# Patient Record
Sex: Male | Born: 1935 | Race: White | Hispanic: No | Marital: Married | Smoking: Former smoker
Health system: Southern US, Community
[De-identification: ages and names within clinical notes are randomized; demographics above are authoritative.]

## PROBLEM LIST (undated history)

## (undated) DIAGNOSIS — K8020 Calculus of gallbladder without cholecystitis without obstruction: Secondary | ICD-10-CM

## (undated) DIAGNOSIS — N183 Chronic kidney disease, stage 3 unspecified: Secondary | ICD-10-CM

## (undated) DIAGNOSIS — N529 Male erectile dysfunction, unspecified: Secondary | ICD-10-CM

## (undated) DIAGNOSIS — K635 Polyp of colon: Secondary | ICD-10-CM

## (undated) DIAGNOSIS — Z95828 Presence of other vascular implants and grafts: Secondary | ICD-10-CM

## (undated) DIAGNOSIS — M1990 Unspecified osteoarthritis, unspecified site: Secondary | ICD-10-CM

## (undated) DIAGNOSIS — D649 Anemia, unspecified: Secondary | ICD-10-CM

## (undated) DIAGNOSIS — L409 Psoriasis, unspecified: Secondary | ICD-10-CM

## (undated) DIAGNOSIS — K5730 Diverticulosis of large intestine without perforation or abscess without bleeding: Secondary | ICD-10-CM

## (undated) DIAGNOSIS — Z7982 Long term (current) use of aspirin: Secondary | ICD-10-CM

## (undated) DIAGNOSIS — I10 Essential (primary) hypertension: Secondary | ICD-10-CM

## (undated) DIAGNOSIS — F419 Anxiety disorder, unspecified: Secondary | ICD-10-CM

## (undated) DIAGNOSIS — N400 Enlarged prostate without lower urinary tract symptoms: Secondary | ICD-10-CM

## (undated) DIAGNOSIS — Z7901 Long term (current) use of anticoagulants: Secondary | ICD-10-CM

## (undated) DIAGNOSIS — Z972 Presence of dental prosthetic device (complete) (partial): Secondary | ICD-10-CM

## (undated) DIAGNOSIS — N200 Calculus of kidney: Secondary | ICD-10-CM

## (undated) DIAGNOSIS — H269 Unspecified cataract: Secondary | ICD-10-CM

## (undated) DIAGNOSIS — I2510 Atherosclerotic heart disease of native coronary artery without angina pectoris: Secondary | ICD-10-CM

## (undated) DIAGNOSIS — I779 Disorder of arteries and arterioles, unspecified: Secondary | ICD-10-CM

## (undated) DIAGNOSIS — Z8719 Personal history of other diseases of the digestive system: Secondary | ICD-10-CM

## (undated) DIAGNOSIS — I999 Unspecified disorder of circulatory system: Secondary | ICD-10-CM

## (undated) DIAGNOSIS — J449 Chronic obstructive pulmonary disease, unspecified: Secondary | ICD-10-CM

## (undated) DIAGNOSIS — K219 Gastro-esophageal reflux disease without esophagitis: Secondary | ICD-10-CM

## (undated) DIAGNOSIS — E785 Hyperlipidemia, unspecified: Secondary | ICD-10-CM

## (undated) DIAGNOSIS — N289 Disorder of kidney and ureter, unspecified: Secondary | ICD-10-CM

## (undated) HISTORY — DX: Hyperlipidemia, unspecified: E78.5

## (undated) HISTORY — PX: LEG SURGERY: SHX1003

## (undated) HISTORY — PX: CAROTID STENT: SHX1301

## (undated) HISTORY — PX: EYE SURGERY: SHX253

## (undated) HISTORY — PX: BACK SURGERY: SHX140

---

## 2003-10-20 ENCOUNTER — Other Ambulatory Visit: Payer: Self-pay

## 2005-03-03 ENCOUNTER — Ambulatory Visit: Payer: Self-pay | Admitting: Internal Medicine

## 2006-03-31 ENCOUNTER — Ambulatory Visit: Payer: Self-pay | Admitting: Internal Medicine

## 2006-04-10 ENCOUNTER — Ambulatory Visit: Payer: Self-pay | Admitting: Internal Medicine

## 2006-06-20 ENCOUNTER — Ambulatory Visit: Payer: Self-pay | Admitting: Gastroenterology

## 2007-03-30 ENCOUNTER — Ambulatory Visit: Payer: Self-pay | Admitting: Internal Medicine

## 2008-04-02 ENCOUNTER — Emergency Department: Payer: Self-pay | Admitting: Emergency Medicine

## 2008-04-03 ENCOUNTER — Ambulatory Visit: Payer: Self-pay | Admitting: Internal Medicine

## 2009-04-21 ENCOUNTER — Ambulatory Visit: Payer: Self-pay | Admitting: Internal Medicine

## 2009-07-21 ENCOUNTER — Ambulatory Visit: Payer: Self-pay | Admitting: Internal Medicine

## 2010-05-05 ENCOUNTER — Ambulatory Visit: Payer: Self-pay | Admitting: Internal Medicine

## 2010-07-20 ENCOUNTER — Ambulatory Visit: Payer: Self-pay | Admitting: Vascular Surgery

## 2010-09-12 ENCOUNTER — Emergency Department: Payer: Self-pay | Admitting: Emergency Medicine

## 2013-01-24 ENCOUNTER — Ambulatory Visit: Payer: Self-pay | Admitting: Vascular Surgery

## 2013-03-06 ENCOUNTER — Ambulatory Visit: Payer: Self-pay | Admitting: Vascular Surgery

## 2013-03-06 LAB — BASIC METABOLIC PANEL
BUN: 22 mg/dL — ABNORMAL HIGH (ref 7–18)
Chloride: 104 mmol/L (ref 98–107)
Creatinine: 1.57 mg/dL — ABNORMAL HIGH (ref 0.60–1.30)
EGFR (African American): 50 — ABNORMAL LOW
EGFR (Non-African Amer.): 43 — ABNORMAL LOW
Sodium: 139 mmol/L (ref 136–145)

## 2013-03-06 LAB — CBC
HCT: 47.2 % (ref 40.0–52.0)
MCV: 85 fL (ref 80–100)
Platelet: 183 10*3/uL (ref 150–440)

## 2013-03-13 ENCOUNTER — Emergency Department: Payer: Self-pay

## 2013-03-13 LAB — URINALYSIS, COMPLETE
Glucose,UR: NEGATIVE mg/dL (ref 0–75)
Hyaline Cast: 1
Leukocyte Esterase: NEGATIVE
Protein: NEGATIVE
Squamous Epithelial: <1
WBC UR: 1 /HPF (ref 0–5)

## 2013-03-13 LAB — COMPREHENSIVE METABOLIC PANEL
Albumin: 4 g/dL (ref 3.4–5.0)
Anion Gap: 9 (ref 7–16)
BUN: 19 mg/dL — ABNORMAL HIGH (ref 7–18)
Bilirubin,Total: 0.4 mg/dL (ref 0.2–1.0)
Calcium, Total: 9.5 mg/dL (ref 8.5–10.1)
Chloride: 104 mmol/L (ref 98–107)
Co2: 24 mmol/L (ref 21–32)
Creatinine: 1.25 mg/dL (ref 0.60–1.30)
EGFR (African American): >60
EGFR (Non-African Amer.): 56 — ABNORMAL LOW
Osmolality: 276 (ref 275–301)
SGPT (ALT): 52 U/L (ref 12–78)
Total Protein: 8.3 g/dL — ABNORMAL HIGH (ref 6.4–8.2)

## 2013-03-13 LAB — CBC
HCT: 48.2 % (ref 40.0–52.0)
MCH: 29.9 pg (ref 26.0–34.0)
MCHC: 35.2 g/dL (ref 32.0–36.0)
MCV: 85 fL (ref 80–100)
Platelet: 210 10*3/uL (ref 150–440)
WBC: 10.8 10*3/uL — ABNORMAL HIGH (ref 3.8–10.6)

## 2013-03-13 LAB — TROPONIN I: Troponin-I: <0.02 ng/mL

## 2013-03-13 LAB — CK TOTAL AND CKMB (NOT AT ARMC): CK, Total: 179 U/L (ref 35–232)

## 2013-03-21 ENCOUNTER — Inpatient Hospital Stay: Payer: Self-pay | Admitting: Vascular Surgery

## 2013-03-22 LAB — PROTIME-INR: Prothrombin Time: 14.4 secs (ref 11.5–14.7)

## 2013-03-22 LAB — CBC WITH DIFFERENTIAL/PLATELET
Basophil #: 0.1 10*3/uL (ref 0.0–0.1)
Basophil %: 0.8 %
Eosinophil #: 0.2 10*3/uL (ref 0.0–0.7)
Eosinophil %: 2.3 %
HCT: 42.6 % (ref 40.0–52.0)
HGB: 14.5 g/dL (ref 13.0–18.0)
Lymphocyte #: 1.2 10*3/uL (ref 1.0–3.6)
MCHC: 34.1 g/dL (ref 32.0–36.0)
MCV: 87 fL (ref 80–100)
Monocyte #: 0.7 x10 3/mm (ref 0.2–1.0)
Neutrophil #: 8.4 10*3/uL — ABNORMAL HIGH (ref 1.4–6.5)
Platelet: 241 10*3/uL (ref 150–440)
RBC: 4.88 10*6/uL (ref 4.40–5.90)
RDW: 13.5 % (ref 11.5–14.5)
WBC: 10.6 10*3/uL (ref 3.8–10.6)

## 2013-03-22 LAB — BASIC METABOLIC PANEL
Anion Gap: 7 (ref 7–16)
BUN: 19 mg/dL — ABNORMAL HIGH (ref 7–18)
Calcium, Total: 8.1 mg/dL — ABNORMAL LOW (ref 8.5–10.1)
Chloride: 110 mmol/L — ABNORMAL HIGH (ref 98–107)
Creatinine: 1.53 mg/dL — ABNORMAL HIGH (ref 0.60–1.30)
EGFR (African American): 51 — ABNORMAL LOW
EGFR (Non-African Amer.): 44 — ABNORMAL LOW
Glucose: 101 mg/dL — ABNORMAL HIGH (ref 65–99)
Osmolality: 284 (ref 275–301)
Potassium: 4.3 mmol/L (ref 3.5–5.1)
Sodium: 141 mmol/L (ref 136–145)

## 2013-03-22 LAB — APTT: Activated PTT: 30.1 secs (ref 23.6–35.9)

## 2013-03-25 LAB — PATHOLOGY REPORT

## 2014-02-25 ENCOUNTER — Emergency Department: Payer: Self-pay | Admitting: Emergency Medicine

## 2014-02-27 ENCOUNTER — Emergency Department: Payer: Self-pay | Admitting: Internal Medicine

## 2014-04-15 ENCOUNTER — Encounter: Payer: Self-pay | Admitting: Surgery

## 2015-02-27 NOTE — Op Note (Signed)
PATIENT NAME:  Bryan Welch, [name] MR#:  [id number] DATE OF BIRTH:  10-May-1936  DATE OF PROCEDURE:  03/21/2013  PREOPERATIVE DIAGNOSES: 1.  High-grade right carotid artery stenosis.  2.  Moderate left carotid artery stenosis.  3.  Hyperlipidemia.  4.  Abdominal aortic aneurysm.   POSTOPERATIVE DIAGNOSES: 1.  High-grade right carotid artery stenosis.  2.  Moderate left carotid artery stenosis.  3.  Hyperlipidemia.  4.  Abdominal aortic aneurysm.   PROCEDURE PERFORMED:  1.  Right carotid endarterectomy.   SURGEON: Festus BarrenJason Teshia Mahone.   ASSISTANT:  Rico Junkerhelsea Hanne, PA-C.   ANESTHESIA: General.   ESTIMATED BLOOD LOSS: Approximately 50 mL.   INDICATION FOR PROCEDURE:  This is a 79 year old gentleman with carotid artery disease. He was found to have an approximately 90% right ICA stenosis and a moderate left ICA stenosis, and he is brought to surgery for right carotid endarterectomy for stroke risk reduction versus medical management. Risks and benefits of the procedure including stroke, cardiopulmonary complications, cranial nerve injury, bleeding, infection and death were all discussed and informed consent was obtained.   DESCRIPTION OF PROCEDURE:  The patient was brought to operative suite and after adequate level of general anesthesia was obtained, he was placed in modified beach chair position. His right neck and chest were sterilely prepped and draped and a sterile surgical field was created. An incision was created along the anterior border of the sternocleidomastoid and I dissected down through the platysma with electrocautery. Sternocleidomastoid was retracted laterally. The facial vein was ligated and divided between silk ties, as were a couple of venous branches Weitlaner retractors used to help facilitate our exposure. The common carotid artery was dissected out and encircled with vessel loops. The superior thyroid and external carotid arteries were then dissected out and encircled with vessel  loops. The vagus nerve and hypoglossal nerve were identified and protected. The internal carotid artery distal lesion was encircled with vessel loops. The patient was then systemically heparinized. This was allowed to circulate for 3 to 4 minutes, and then control was pulled up on the vessel loops. An anterior wall arteriotomy was created with an 11 blade and extended with Potts scissors. The Pruitt-Inahara shunt was placed first in the internal carotid artery, flushed and de-aired, then the common carotid artery. Approximately 90 seconds elapsed from the clamp to the placement of the shunt. Flow was then restored, and an endarterectomy was performed in the standard fashion at this point. An eversion endarterectomy was performed on the external carotid artery. The proximal endpoint was cut flush with tenotomy scissors, and a nice feathered distal endpoint was created with gentle traction of the internal carotid artery. A single 6-0 Prolene patch suture was used for hemostasis and hemostasis was complete. Surgicel and Evicyl topical hemostatic agents were also used after wound irrigation. The incision was closed with 3 interrupted sutures in the sternocleidomastoid space. The platysmas was closed with a running 3-0 Vicryl, and the skin was closed with 4-0 Monocryl. Dermabond was placed as a dressing. The patient tolerated the procedure well and was taken to the recovery room in stable condition.     ____________________________ Annice NeedyJason S. Brendt Dible, MD jsd:dmm D: 03/21/2013 14:27:19 ET T: 03/21/2013 15:47:58 ET JOB#: [id number]  cc: Annice NeedyJason S. Takoda Janowiak, MD, <Dictator> Stann Mainlandavid P. Sampson GoonFitzgerald, MD Annice NeedyJASON S Terrianna Holsclaw MD ELECTRONICALLY SIGNED 04/03/2013 13:49

## 2015-08-12 ENCOUNTER — Ambulatory Visit: Payer: Self-pay

## 2015-08-12 ENCOUNTER — Ambulatory Visit: Payer: Self-pay | Admitting: Obstetrics and Gynecology

## 2016-09-27 ENCOUNTER — Other Ambulatory Visit (INDEPENDENT_AMBULATORY_CARE_PROVIDER_SITE_OTHER): Payer: Self-pay | Admitting: Vascular Surgery

## 2016-09-27 DIAGNOSIS — I6523 Occlusion and stenosis of bilateral carotid arteries: Secondary | ICD-10-CM

## 2016-10-03 ENCOUNTER — Other Ambulatory Visit (INDEPENDENT_AMBULATORY_CARE_PROVIDER_SITE_OTHER): Payer: Self-pay | Admitting: Vascular Surgery

## 2016-10-03 DIAGNOSIS — I6523 Occlusion and stenosis of bilateral carotid arteries: Secondary | ICD-10-CM

## 2016-10-04 ENCOUNTER — Ambulatory Visit (INDEPENDENT_AMBULATORY_CARE_PROVIDER_SITE_OTHER): Payer: Medicare Other | Admitting: Vascular Surgery

## 2016-10-04 ENCOUNTER — Encounter (INDEPENDENT_AMBULATORY_CARE_PROVIDER_SITE_OTHER): Payer: Medicare Other

## 2016-10-18 ENCOUNTER — Ambulatory Visit
Admission: RE | Admit: 2016-10-18 | Discharge: 2016-10-18 | Disposition: A | Discharge status: Home / Self Care | Payer: Medicare Other | Source: Ambulatory Visit | Attending: Vascular Surgery | Admitting: Vascular Surgery

## 2016-10-18 DIAGNOSIS — I6523 Occlusion and stenosis of bilateral carotid arteries: Secondary | ICD-10-CM | POA: Diagnosis present

## 2016-11-15 ENCOUNTER — Encounter (INDEPENDENT_AMBULATORY_CARE_PROVIDER_SITE_OTHER): Payer: Self-pay | Admitting: Vascular Surgery

## 2016-11-15 ENCOUNTER — Ambulatory Visit (INDEPENDENT_AMBULATORY_CARE_PROVIDER_SITE_OTHER): Payer: Medicare Other | Admitting: Vascular Surgery

## 2016-11-15 VITALS — BP 141/70 | HR 78 | Resp 16 | Ht 65.5 in | Wt 153.2 lb

## 2016-11-15 DIAGNOSIS — I6523 Occlusion and stenosis of bilateral carotid arteries: Secondary | ICD-10-CM

## 2016-11-15 DIAGNOSIS — E785 Hyperlipidemia, unspecified: Secondary | ICD-10-CM | POA: Diagnosis not present

## 2016-11-15 DIAGNOSIS — I6529 Occlusion and stenosis of unspecified carotid artery: Secondary | ICD-10-CM | POA: Insufficient documentation

## 2016-11-15 DIAGNOSIS — I10 Essential (primary) hypertension: Secondary | ICD-10-CM

## 2016-11-15 NOTE — Assessment & Plan Note (Signed)
blood pressure control important in reducing the progression of atherosclerotic disease. On appropriate oral medications.  

## 2016-11-15 NOTE — Progress Notes (Signed)
MRN : [id number]  Bryan Welch is a 81 y.o. ([birth year]) male who presents with chief complaint of  Chief Complaint  Patient presents with  . Follow-up  .  History of Present Illness: Patient returns in follow-up of his carotid disease. He is doing well and does not have specific complaints today. He denies any focal neurologic symptoms. Specifically, the patient denies amaurosis fugax, speech or swallowing difficulties, or arm or leg weakness or numbness. His carotid duplex performed at the hospital last month demonstrates basically stable carotid artery stenosis on the lower and of the 50-69% range on the left. He is status post previous surgery on the right and this appears patent without significant recurrent stenosis.  Current Outpatient Prescriptions  Medication Sig Dispense Refill  . amLODipine (NORVASC) 2.5 MG tablet Take by mouth.    Marland Kitchen aspirin EC 81 MG tablet Take by mouth.    . Cholecalciferol (VITAMIN D3) 1000 units CAPS Take by mouth.    . diphenhydrAMINE (BENADRYL) 25 mg capsule Take by mouth.    . hydrochlorothiazide (HYDRODIURIL) 25 MG tablet     . pravastatin (PRAVACHOL) 20 MG tablet Take by mouth.    . ranitidine (ZANTAC) 150 MG tablet Take by mouth.    . tamsulosin (FLOMAX) 0.4 MG CAPS capsule Take by mouth.     No current facility-administered medications for this visit.     Past Medical History:  Diagnosis Date  . Hyperlipidemia     Past Surgical History:  Procedure Laterality Date  . BACK SURGERY    . CAROTID STENT Right   . LEG SURGERY Bilateral    stent placement    Social History Social History  Substance Use Topics  . Smoking status: Former Games developer  . Smokeless tobacco: Never Used  . Alcohol use Yes     Comment: occasionally      Family History Family History  Problem Relation Age of Onset  . Cancer Mother   . Cancer Father      Allergies  Allergen Reactions  . Codeine Itching     REVIEW OF SYSTEMS (Negative unless  checked)  Constitutional: [] Weight loss  [] Fever  [] Chills Cardiac: [] Chest pain   [] Chest pressure   [] Palpitations   [] Shortness of breath when laying flat   [] Shortness of breath at rest   [] Shortness of breath with exertion. Vascular:  [] Pain in legs with walking   [] Pain in legs at rest   [] Pain in legs when laying flat   [x] Claudication   [] Pain in feet when walking  [] Pain in feet at rest  [] Pain in feet when laying flat   [] History of DVT   [] Phlebitis   [] Swelling in legs   [] Varicose veins   [] Non-healing ulcers Pulmonary:   [] Uses home oxygen   [] Productive cough   [] Hemoptysis   [] Wheeze  [] COPD   [] Asthma Neurologic:  [] Dizziness  [] Blackouts   [] Seizures   [] History of stroke   [] History of TIA  [] Aphasia   [] Temporary blindness   [] Dysphagia   [] Weakness or numbness in arms   [] Weakness or numbness in legs Musculoskeletal:  [] Arthritis   [] Joint swelling   [] Joint pain   [] Low back pain Hematologic:  [] Easy bruising  [] Easy bleeding   [] Hypercoagulable state   [] Anemic  [] Hepatitis Gastrointestinal:  [] Blood in stool   [] Vomiting blood  [] Gastroesophageal reflux/heartburn   [] Difficulty swallowing. Genitourinary:  [] Chronic kidney disease   [] Difficult urination  [] Frequent urination  [] Burning with urination   [] Blood  in urine Skin:  [] Rashes   [] Ulcers   [] Wounds Psychological:  [] History of anxiety   []  History of major depression.  Physical Examination  Vitals:   11/15/16 1028  BP: (!) 141/70  Pulse: 78  Resp: 16  Weight: 153 lb 3.2 oz (69.5 kg)  Height: 5' 5.5" (1.664 m)   Body mass index is 25.11 kg/m. Gen:  WD/WN, NAD Head: [place]/AT, No temporalis wasting. Ear/Nose/Throat: Hearing grossly intact, nares w/o erythema or drainage, trachea midline Eyes: Conjunctiva clear. Sclera non-icteric Neck: Supple.  No bruit or JVD.  Pulmonary:  Good air movement, equal and clear to auscultation bilaterally.  Cardiac: RRR, normal S1, S2, no Murmurs, rubs or gallops. Vascular:   Vessel Right Left  Radial Palpable Palpable  Ulnar Palpable Palpable  Brachial Palpable Palpable  Carotid Palpable, without bruit Palpable, without bruit  Aorta Not palpable N/A  Femoral Palpable Palpable  Popliteal Palpable 1+ Palpable  PT Palpable Trace Palpable  DP 1+ Palpable Palpable   Gastrointestinal: soft, non-tender/non-distended. No guarding/reflex.  Musculoskeletal: M/S 5/5 throughout.  No deformity or atrophy.  Neurologic: CN 2-12 intact. Sensation grossly intact in extremities.  Symmetrical.  Speech is fluent. Motor exam as listed above. Psychiatric: Judgment intact, Mood & affect appropriate for pt's clinical situation. Dermatologic: No rashes or ulcers noted.  No cellulitis or open wounds. Lymph : No Cervical, Axillary, or Inguinal lymphadenopathy.     CBC Lab Results  Component Value Date   WBC 10.6 03/22/2013   HGB 14.5 03/22/2013   HCT 42.6 03/22/2013   MCV 87 03/22/2013   PLT 241 03/22/2013    BMET    Component Value Date/Time   NA 141 03/22/2013 0237   K 4.3 03/22/2013 0237   CL 110 (H) 03/22/2013 0237   CO2 24 03/22/2013 0237   GLUCOSE 101 (H) 03/22/2013 0237   BUN 19 (H) 03/22/2013 0237   CREATININE 1.53 (H) 03/22/2013 0237   CALCIUM 8.1 (L) 03/22/2013 0237   GFRNONAA 44 (L) 03/22/2013 0237   GFRAA 51 (L) 03/22/2013 0237   CrCl cannot be calculated (Patient's most recent lab result is older than the maximum 21 days allowed.).  COAG Lab Results  Component Value Date   INR 1.1 03/22/2013    Radiology US Carotid Bilateral  Result Date: 10/18/2016 CLINICAL DATA:  Bilateral carotid atherosclerosis, hypertension EXAM: BILATERAL CAROTID DUPLEX ULTRASOUND TECHNIQUE: Wallace Cullens scale imaging, color Doppler and duplex ultrasound were performed of bilateral carotid and vertebral arteries in the neck. COMPARISON:  01/24/2013, 04/21/2009 FINDINGS: Criteria: Quantification of carotid stenosis is based on velocity parameters that correlate the residual  internal carotid diameter with NASCET-based stenosis levels, using the diameter of the distal internal carotid lumen as the denominator for stenosis measurement. The following velocity measurements were obtained: RIGHT ICA:  68/15 cm/sec CCA:  148/18 cm/sec SYSTOLIC ICA/CCA RATIO:  0.5 DIASTOLIC ICA/CCA RATIO:  0.8 ECA:  82 cm/sec LEFT ICA:  155/21 cm/sec CCA:  210/12 cm/sec SYSTOLIC ICA/CCA RATIO:  0.7 DIASTOLIC ICA/CCA RATIO:  1.7 ECA:  178 cm/sec RIGHT CAROTID ARTERY: Minor scattered and partially calcified right carotid bifurcation atherosclerosis. No significant luminal narrowing by grayscale imaging. No hemodynamically significant right ICA stenosis, velocity elevation, or turbulent flow. RIGHT VERTEBRAL ARTERY:  Antegrade LEFT CAROTID ARTERY: Moderately severe heterogeneous and partially calcified left carotid bifurcation atherosclerosis. This narrows the distal CCA and proximal ICA lumen by grayscale imaging. Slight velocity elevation of the left proximal ICA measuring 155/21 cm/sec. Mild turbulent flow noted at this level. Degree of  left ICA stenosis is estimated at 50- 69% by ultrasound criteria. LEFT VERTEBRAL ARTERY:  Antegrade IMPRESSION: Left greater the right carotid atherosclerosis. Right ICA narrowing less than 50% Moderate left ICA stenosis estimated at 50- 69% Patent antegrade vertebral flow bilaterally Electronically Signed   By: Judie PetitM.  Shick M.D.   On: 10/18/2016 09:05     Assessment/Plan Hyperlipidemia lipid control important in reducing the progression of atherosclerotic disease. Continue statin therapy   Essential hypertension, benign blood pressure control important in reducing the progression of atherosclerotic disease. On appropriate oral medications.   Carotid stenosis Recommend:  Given the patient's asymptomatic subcritical stenosis no further invasive testing or surgery at this time.  Duplex ultrasound shows 50-69% left carotid stenosis and normal velocities in the right  CEA.  Continue antiplatelet therapy as prescribed Continue management of CAD, HTN and Hyperlipidemia Healthy heart diet,  encouraged exercise at least 4 times per week Follow up in 6 months with duplex ultrasound and physical exam based on >50% stenosis of the left carotid artery        Festus BarrenJason Leilynn Pilat, MD  11/15/2016 11:16 AM    This note was created with Dragon medical transcription system.  Any errors from dictation are purely unintentional

## 2016-11-15 NOTE — Assessment & Plan Note (Signed)
Recommend:  Given the patient's asymptomatic subcritical stenosis no further invasive testing or surgery at this time.  Duplex ultrasound shows 50-69% left carotid stenosis and normal velocities in the right CEA.  Continue antiplatelet therapy as prescribed Continue management of CAD, HTN and Hyperlipidemia Healthy heart diet,  encouraged exercise at least 4 times per week Follow up in 6 months with duplex ultrasound and physical exam based on >50% stenosis of the left carotid artery

## 2016-11-15 NOTE — Assessment & Plan Note (Signed)
lipid control important in reducing the progression of atherosclerotic disease. Continue statin therapy  

## 2017-04-04 ENCOUNTER — Encounter (INDEPENDENT_AMBULATORY_CARE_PROVIDER_SITE_OTHER): Payer: Self-pay

## 2017-04-04 ENCOUNTER — Other Ambulatory Visit (INDEPENDENT_AMBULATORY_CARE_PROVIDER_SITE_OTHER): Payer: Self-pay | Admitting: Vascular Surgery

## 2017-04-04 ENCOUNTER — Ambulatory Visit (INDEPENDENT_AMBULATORY_CARE_PROVIDER_SITE_OTHER): Payer: Self-pay | Admitting: Vascular Surgery

## 2017-04-04 DIAGNOSIS — I714 Abdominal aortic aneurysm, without rupture, unspecified: Secondary | ICD-10-CM

## 2017-04-04 DIAGNOSIS — Z95820 Peripheral vascular angioplasty status with implants and grafts: Secondary | ICD-10-CM

## 2017-04-05 ENCOUNTER — Encounter (INDEPENDENT_AMBULATORY_CARE_PROVIDER_SITE_OTHER): Payer: Self-pay | Admitting: Vascular Surgery

## 2017-04-05 ENCOUNTER — Ambulatory Visit (INDEPENDENT_AMBULATORY_CARE_PROVIDER_SITE_OTHER): Payer: Medicare Other

## 2017-04-05 ENCOUNTER — Ambulatory Visit (INDEPENDENT_AMBULATORY_CARE_PROVIDER_SITE_OTHER): Payer: Medicare Other | Admitting: Vascular Surgery

## 2017-04-05 VITALS — BP 125/60 | HR 62 | Resp 15 | Ht 65.0 in | Wt 152.0 lb

## 2017-04-05 DIAGNOSIS — I714 Abdominal aortic aneurysm, without rupture, unspecified: Secondary | ICD-10-CM

## 2017-04-05 DIAGNOSIS — I6523 Occlusion and stenosis of bilateral carotid arteries: Secondary | ICD-10-CM | POA: Diagnosis not present

## 2017-04-05 DIAGNOSIS — Z95820 Peripheral vascular angioplasty status with implants and grafts: Secondary | ICD-10-CM

## 2017-04-05 DIAGNOSIS — I739 Peripheral vascular disease, unspecified: Secondary | ICD-10-CM

## 2017-04-05 HISTORY — DX: Peripheral vascular disease, unspecified: I73.9

## 2017-04-05 HISTORY — DX: Abdominal aortic aneurysm, without rupture, unspecified: I71.40

## 2017-04-05 LAB — VAS US CAROTID
LEFT ECA DIAS: -28 cm/s
LICADDIAS: -15 cm/s
Left CCA dist dias: 41 cm/s
Left CCA dist sys: 226 cm/s
Left CCA prox dias: 15 cm/s
Left CCA prox sys: 106 cm/s
Left ICA dist sys: -71 cm/s
Left ICA prox dias: -47 cm/s
Left ICA prox sys: -184 cm/s
RCCADSYS: -81 cm/s
RCCAPDIAS: 16 cm/s
RIGHT CCA MID DIAS: 20 cm/s
Right CCA prox sys: 147 cm/s

## 2017-04-05 NOTE — Progress Notes (Signed)
Subjective:    Patient ID: Bryan Welch, male    DOB: 1936/10/16, 81 y.o.   MRN: [id number] Chief Complaint  Patient presents with  . Re-evaluation    Ultrsound follow up   Patient presents for a yearly follow up regarding multiple vascular issues. The patient underwent a bilateral carotid duplex scan which showed no change from the previous exam on 03/30/16. Duplex is stable with a patent right CEA and Left ICA stenosis (40-59%). The patient denies experiencing Amaurosis Fugax, TIA like symptoms or focal motor deficits. Right ABI: mild disease and no Left diease. The patient denies any claudication like symptoms, rest pain or ulcers to her feet / toes. He underwent an aortic duplex which was notable for an abdominal aortic aneurysm measuring 3.6cm AP x 3.7cm transverse (previous 3.8cm). He denies any symptoms such as back pain, pulsatile abdominal masses or thrombosis in his extremities.   Review of Systems  Constitutional: Negative.   HENT: Negative.   Eyes: Negative.   Respiratory: Negative.   Cardiovascular: Negative.   Gastrointestinal: Negative.   Endocrine: Negative.   Genitourinary: Negative.   Musculoskeletal: Negative.   Skin: Negative.   Allergic/Immunologic: Negative.   Neurological: Negative.   Hematological: Negative.   Psychiatric/Behavioral: Negative.       Objective:   Physical Exam  Constitutional: He is oriented to person, place, and time. He appears well-developed and well-nourished. No distress.  HENT:  Head: Normocephalic and atraumatic.  Eyes: Conjunctivae are normal. Pupils are equal, round, and reactive to light.  Neck: Normal range of motion.  No carotid bruits noted  Cardiovascular: Normal rate, regular rhythm, normal heart sounds and intact distal pulses.   Pulses:      Radial pulses are 2+ on the right side, and 2+ on the left side.       Dorsalis pedis pulses are 2+ on the right side, and 2+ on the left side.       Posterior tibial pulses  are 2+ on the right side, and 2+ on the left side.  Pulmonary/Chest: Effort normal.  Abdominal: Soft.  Musculoskeletal: Normal range of motion. He exhibits no edema.  Neurological: He is alert and oriented to person, place, and time.  Skin: Skin is warm and dry. He is not diaphoretic.  Psychiatric: He has a normal mood and affect. His behavior is normal. Judgment and thought content normal.  Vitals reviewed.  BP 125/60 (BP Location: Left Arm)   Pulse 62   Resp 15   Ht 5\' 5"  (1.651 m)   Wt 152 lb (68.9 kg)   BMI 25.29 kg/m   Past Medical History:  Diagnosis Date  . Hyperlipidemia     Social History   Social History  . Marital status: Widowed    Spouse name: N/A  . Number of children: N/A  . Years of education: N/A   Occupational History  . Not on file.   Social History Main Topics  . Smoking status: Former Games developer  . Smokeless tobacco: Never Used  . Alcohol use Yes     Comment: occasionally  . Drug use: No  . Sexual activity: Not on file   Other Topics Concern  . Not on file   Social History Narrative  . No narrative on file   Past Surgical History:  Procedure Laterality Date  . BACK SURGERY    . CAROTID STENT Right   . LEG SURGERY Bilateral    stent placement   Family History  Problem Relation  Age of Onset  . Cancer Mother   . Cancer Father    Allergies  Allergen Reactions  . Codeine Itching      Assessment & Plan:  Patient presents for a yearly follow up regarding multiple vascular issues. The patient underwent a bilateral carotid duplex scan which showed no change from the previous exam on 03/30/16. Duplex is stable with a patent right CEA and Left ICA stenosis (40-59%). The patient denies experiencing Amaurosis Fugax, TIA like symptoms or focal motor deficits. Right ABI: mild disease and no Left diease. The patient denies any claudication like symptoms, rest pain or ulcers to her feet / toes. He underwent an aortic duplex which was notable for an  abdominal aortic aneurysm measuring 3.6cm AP x 3.7cm transverse (previous 3.8cm). He denies any symptoms such as back pain, pulsatile abdominal masses or thrombosis in his extremities.  1. PAD (peripheral artery disease) (HCC) - Stable Studies reviewed with patient. Asymptomatic.  No intervention indicated at this time.   Patient to remain abstinent of tobacco use. I have discussed with the patient at length the risk factors for and pathogenesis of atherosclerotic disease and encouraged a healthy diet, regular exercise regimen and blood pressure / glucose control.  The patient was encouraged to call the office in the interim if he experiences any claudication like symptoms, rest pain or ulcers to his feet / toes.  - VAS US ABI WITH/WO TBI; Future  2. Bilateral carotid artery stenosis - Stable Studies reviewed with patient. Patient asymptomatic with stable duplex.   No intervention at this time.  Patient to remain abstinent of tobacco use. I have discussed with the patient at length the risk factors for and pathogenesis of atherosclerotic disease and encouraged a healthy diet, regular exercise regimen and blood pressure / glucose control.  Patient was instructed to contact our office in the interim with problems such as arm / leg weakness or numbness, speech / swallowing difficulty or temporary monocular blindness. The patient expresses their understanding.   - VAS US CAROTID; Future  3. AAA (abdominal aortic aneurysm) without rupture (HCC) - Stable Studies reviewed with patient. Duplex stable, physical exam unremarkable.  No surgery or intervention at this time. The patient has an asymptomatic abdominal aortic aneurysm that is less than 4 cm in maximal diameter.  I have reviewed the natural history of abdominal aortic aneurysm and the small risk of rupture for aneurysm less than 5 cm in size.  However, as these small aneurysms tend to enlarge over time, continued surveillance with  ultrasound or CT scan is mandatory.  [name] blood pressure is being adequately controlled however I have reviewed the importance of hypertension and lipid control and the importance of continuing his abstinence from tobacco.  The patient is also encouraged to exercise a minimum of 30 minutes 4 times a week.  Should the patient develop new onset abdominal or back pain or signs of peripheral embolization they are instructed to seek medical attention immediately and to alert the physician providing care that they have an aneurysm.  The patient voices their understanding.  - VAS US AORTA/IVC/ILIACS; Future  Current Outpatient Prescriptions on File Prior to Visit  Medication Sig Dispense Refill  . amLODipine (NORVASC) 2.5 MG tablet Take by mouth.    Marland Kitchen. aspirin EC 81 MG tablet Take by mouth.    . Cholecalciferol (VITAMIN D3) 1000 units CAPS Take by mouth.    . diphenhydrAMINE (BENADRYL) 25 mg capsule Take by mouth.    .Marland Kitchen  hydrochlorothiazide (HYDRODIURIL) 25 MG tablet     . pravastatin (PRAVACHOL) 20 MG tablet Take by mouth.    . ranitidine (ZANTAC) 150 MG tablet Take by mouth.    . tamsulosin (FLOMAX) 0.4 MG CAPS capsule Take by mouth.     No current facility-administered medications on file prior to visit.    There are no Patient Instructions on file for this visit. No Follow-up on file.  Mikaili Flippin A Shuan Statzer, PA-C

## 2017-09-20 ENCOUNTER — Emergency Department: Payer: Medicare Other

## 2017-09-20 ENCOUNTER — Other Ambulatory Visit: Payer: Self-pay

## 2017-09-20 ENCOUNTER — Inpatient Hospital Stay
Admission: EM | Admit: 2017-09-20 | Discharge: 2017-09-22 | DRG: 272 | Disposition: A | Discharge status: Home / Self Care | Payer: Medicare Other | Attending: Internal Medicine | Admitting: Internal Medicine

## 2017-09-20 DIAGNOSIS — I10 Essential (primary) hypertension: Secondary | ICD-10-CM | POA: Diagnosis present

## 2017-09-20 DIAGNOSIS — Z87891 Personal history of nicotine dependence: Secondary | ICD-10-CM

## 2017-09-20 DIAGNOSIS — Z885 Allergy status to narcotic agent status: Secondary | ICD-10-CM

## 2017-09-20 DIAGNOSIS — I714 Abdominal aortic aneurysm, without rupture: Secondary | ICD-10-CM

## 2017-09-20 DIAGNOSIS — I2510 Atherosclerotic heart disease of native coronary artery without angina pectoris: Secondary | ICD-10-CM | POA: Diagnosis present

## 2017-09-20 DIAGNOSIS — M79604 Pain in right leg: Secondary | ICD-10-CM

## 2017-09-20 DIAGNOSIS — Z79899 Other long term (current) drug therapy: Secondary | ICD-10-CM

## 2017-09-20 DIAGNOSIS — Z7982 Long term (current) use of aspirin: Secondary | ICD-10-CM | POA: Diagnosis not present

## 2017-09-20 DIAGNOSIS — E785 Hyperlipidemia, unspecified: Secondary | ICD-10-CM | POA: Diagnosis present

## 2017-09-20 DIAGNOSIS — I998 Other disorder of circulatory system: Secondary | ICD-10-CM | POA: Diagnosis present

## 2017-09-20 DIAGNOSIS — I70228 Atherosclerosis of native arteries of extremities with rest pain, other extremity: Secondary | ICD-10-CM

## 2017-09-20 DIAGNOSIS — Z95820 Peripheral vascular angioplasty status with implants and grafts: Secondary | ICD-10-CM

## 2017-09-20 DIAGNOSIS — I70221 Atherosclerosis of native arteries of extremities with rest pain, right leg: Principal | ICD-10-CM | POA: Diagnosis present

## 2017-09-20 DIAGNOSIS — I70229 Atherosclerosis of native arteries of extremities with rest pain, unspecified extremity: Secondary | ICD-10-CM

## 2017-09-20 HISTORY — DX: Atherosclerotic heart disease of native coronary artery without angina pectoris: I25.10

## 2017-09-20 HISTORY — DX: Essential (primary) hypertension: I10

## 2017-09-20 HISTORY — DX: Unspecified disorder of circulatory system: I99.9

## 2017-09-20 LAB — BASIC METABOLIC PANEL
Anion gap: 10 (ref 5–15)
BUN: 26 mg/dL — AB (ref 6–20)
CHLORIDE: 103 mmol/L (ref 101–111)
CO2: 26 mmol/L (ref 22–32)
Calcium: 9.2 mg/dL (ref 8.9–10.3)
Creatinine, Ser: 1.44 mg/dL — ABNORMAL HIGH (ref 0.61–1.24)
GFR calc Af Amer: 52 mL/min — ABNORMAL LOW (ref >60)
GFR calc non Af Amer: 45 mL/min — ABNORMAL LOW (ref >60)
GLUCOSE: 104 mg/dL — AB (ref 65–99)
POTASSIUM: 3.6 mmol/L (ref 3.5–5.1)
Sodium: 139 mmol/L (ref 135–145)

## 2017-09-20 LAB — CBC WITH DIFFERENTIAL/PLATELET
Basophils Absolute: 0.1 10*3/uL (ref 0–0.1)
Basophils Relative: 1 %
EOS PCT: 5 %
Eosinophils Absolute: 0.4 10*3/uL (ref 0–0.7)
HCT: 47.4 % (ref 40.0–52.0)
HEMOGLOBIN: 16 g/dL (ref 13.0–18.0)
LYMPHS ABS: 1.2 10*3/uL (ref 1.0–3.6)
LYMPHS PCT: 16 %
MCH: 29.3 pg (ref 26.0–34.0)
MCHC: 33.7 g/dL (ref 32.0–36.0)
MCV: 87 fL (ref 80.0–100.0)
Monocytes Absolute: 0.5 10*3/uL (ref 0.2–1.0)
Monocytes Relative: 7 %
Neutro Abs: 5.4 10*3/uL (ref 1.4–6.5)
Neutrophils Relative %: 71 %
PLATELETS: 187 10*3/uL (ref 150–440)
RBC: 5.45 MIL/uL (ref 4.40–5.90)
RDW: 14 % (ref 11.5–14.5)
WBC: 7.5 10*3/uL (ref 3.8–10.6)

## 2017-09-20 LAB — PROTIME-INR
INR: 0.93
PROTHROMBIN TIME: 12.4 s (ref 11.4–15.2)

## 2017-09-20 LAB — APTT: APTT: 28 s (ref 24–36)

## 2017-09-20 LAB — LACTIC ACID, PLASMA: Lactic Acid, Venous: 1.6 mmol/L (ref 0.5–1.9)

## 2017-09-20 LAB — HEPARIN LEVEL (UNFRACTIONATED): HEPARIN UNFRACTIONATED: 0.75 [IU]/mL — AB (ref 0.30–0.70)

## 2017-09-20 MED ORDER — FAMOTIDINE 20 MG PO TABS
20.0000 mg | ORAL_TABLET | Freq: Every day | ORAL | Status: DC
  Administered 2017-09-20 – 2017-09-22 (×2): 20 mg via ORAL
  Filled 2017-09-20 (×2): qty 1

## 2017-09-20 MED ORDER — SODIUM CHLORIDE 0.9% FLUSH
3.0000 mL | Freq: Two times a day (BID) | INTRAVENOUS | Status: DC
  Administered 2017-09-21 – 2017-09-22 (×2): 3 mL via INTRAVENOUS

## 2017-09-20 MED ORDER — DIPHENHYDRAMINE HCL 25 MG PO CAPS
25.0000 mg | ORAL_CAPSULE | Freq: Every day | ORAL | Status: DC
  Administered 2017-09-20 – 2017-09-21 (×2): 25 mg via ORAL
  Filled 2017-09-20 (×2): qty 1

## 2017-09-20 MED ORDER — SODIUM CHLORIDE 0.9 % IV BOLUS (SEPSIS)
1000.0000 mL | Freq: Once | INTRAVENOUS | Status: AC
  Administered 2017-09-20: 1000 mL via INTRAVENOUS

## 2017-09-20 MED ORDER — HYDROCODONE-ACETAMINOPHEN 5-325 MG PO TABS: 1.0000 | ORAL_TABLET | ORAL | Status: DC | PRN

## 2017-09-20 MED ORDER — ACETAMINOPHEN 325 MG PO TABS
650.0000 mg | ORAL_TABLET | Freq: Four times a day (QID) | ORAL | Status: DC | PRN
  Administered 2017-09-20 – 2017-09-22 (×3): 650 mg via ORAL
  Filled 2017-09-20 (×3): qty 2

## 2017-09-20 MED ORDER — IOPAMIDOL (ISOVUE-370) INJECTION 76%
90.0000 mL | Freq: Once | INTRAVENOUS | Status: AC | PRN
  Administered 2017-09-20: 90 mL via INTRAVENOUS

## 2017-09-20 MED ORDER — ACETAMINOPHEN 650 MG RE SUPP: 650.0000 mg | Freq: Four times a day (QID) | RECTAL | Status: DC | PRN

## 2017-09-20 MED ORDER — VITAMIN D 1000 UNITS PO TABS
1000.0000 [IU] | ORAL_TABLET | Freq: Every day | ORAL | Status: DC
  Administered 2017-09-22: 1000 [IU] via ORAL
  Filled 2017-09-20: qty 1

## 2017-09-20 MED ORDER — DEXTROSE IN LACTATED RINGERS 5 % IV SOLN
INTRAVENOUS | Status: DC
  Filled 2017-09-20: qty 1000

## 2017-09-20 MED ORDER — POLYETHYLENE GLYCOL 3350 17 G PO PACK: 17.0000 g | PACK | Freq: Every day | ORAL | Status: DC | PRN

## 2017-09-20 MED ORDER — FENTANYL CITRATE (PF) 100 MCG/2ML IJ SOLN
50.0000 ug | INTRAMUSCULAR | Status: DC | PRN
  Administered 2017-09-20: 50 ug via INTRAVENOUS
  Filled 2017-09-20: qty 2

## 2017-09-20 MED ORDER — ALPRAZOLAM 0.25 MG PO TABS
0.2500 mg | ORAL_TABLET | Freq: Every day | ORAL | Status: DC
  Administered 2017-09-20 – 2017-09-21 (×2): 0.25 mg via ORAL
  Filled 2017-09-20 (×2): qty 1

## 2017-09-20 MED ORDER — HEPARIN BOLUS VIA INFUSION
4000.0000 [IU] | Freq: Once | INTRAVENOUS | Status: AC
  Administered 2017-09-20: 4000 [IU] via INTRAVENOUS
  Filled 2017-09-20: qty 4000

## 2017-09-20 MED ORDER — HEPARIN (PORCINE) IN NACL 100-0.45 UNIT/ML-% IJ SOLN
1000.0000 [IU]/h | INTRAMUSCULAR | Status: DC
  Administered 2017-09-20: 1100 [IU]/h via INTRAVENOUS
  Administered 2017-09-21: 1000 [IU]/h via INTRAVENOUS
  Filled 2017-09-20 (×3): qty 250

## 2017-09-20 MED ORDER — ATORVASTATIN CALCIUM 20 MG PO TABS
20.0000 mg | ORAL_TABLET | Freq: Every day | ORAL | Status: DC
  Administered 2017-09-21: 20 mg via ORAL
  Filled 2017-09-20: qty 1

## 2017-09-20 MED ORDER — TAMSULOSIN HCL 0.4 MG PO CAPS
0.4000 mg | ORAL_CAPSULE | Freq: Every day | ORAL | Status: DC
  Administered 2017-09-20 – 2017-09-22 (×2): 0.4 mg via ORAL
  Filled 2017-09-20 (×2): qty 1

## 2017-09-20 MED ORDER — MORPHINE SULFATE (PF) 2 MG/ML IV SOLN
2.0000 mg | INTRAVENOUS | Status: DC | PRN
  Administered 2017-09-21: 2 mg via INTRAVENOUS
  Filled 2017-09-20: qty 1

## 2017-09-20 MED ORDER — VITAMIN D3 25 MCG (1000 UT) PO CAPS: 1.0000 | ORAL_CAPSULE | Freq: Every day | ORAL | Status: DC

## 2017-09-20 MED ORDER — ONDANSETRON HCL 4 MG/2ML IJ SOLN: 4.0000 mg | Freq: Four times a day (QID) | INTRAMUSCULAR | Status: DC | PRN

## 2017-09-20 MED ORDER — CEFAZOLIN SODIUM-DEXTROSE 2-4 GM/100ML-% IV SOLN
2.0000 g | INTRAVENOUS | Status: AC
  Administered 2017-09-21: 2 g via INTRAVENOUS
  Filled 2017-09-20: qty 100

## 2017-09-20 MED ORDER — ONDANSETRON HCL 4 MG PO TABS: 4.0000 mg | ORAL_TABLET | Freq: Four times a day (QID) | ORAL | Status: DC | PRN

## 2017-09-20 MED ORDER — AMLODIPINE BESYLATE 5 MG PO TABS
2.5000 mg | ORAL_TABLET | Freq: Every day | ORAL | Status: DC
  Administered 2017-09-22: 2.5 mg via ORAL
  Filled 2017-09-20 (×2): qty 1

## 2017-09-20 NOTE — H&P (Signed)
[employer] [facility] [place]   PATIENT NAME: Bryan Welch    MR#:  [id number]  DATE OF BIRTH:  October 17, 1936  DATE OF ADMISSION:  09/20/2017  PRIMARY CARE PHYSICIAN: Mick Sell, MD   REQUESTING/REFERRING PHYSICIAN:   CHIEF COMPLAINT:   Chief Complaint  Patient presents with  . Numbness    HISTORY OF PRESENT ILLNESS: Bryan Welch  is a 81 y.o. 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  Patient evaluated at bedside emergency room, distress,.  Patient is now being admitted for acute right lower extremity limb ischemia.  PAST MEDICAL HISTORY:   Past Medical History:  Diagnosis Date  . Coronary artery disease   . Hyperlipidemia   . Hypertension   . Vascular abnormality     PAST SURGICAL HISTORY:  Past Surgical History:  Procedure Laterality Date  . BACK SURGERY    . CAROTID STENT Right   . LEG SURGERY Bilateral    stent placement    SOCIAL HISTORY:  Social History   Tobacco Use  . Smoking status: Former Games developer  . Smokeless tobacco: Never Used  Substance Use Topics  . Alcohol use: Yes    Comment: occasionally    FAMILY HISTORY:  Family History  Problem Relation Age of Onset  . Cancer Mother   . Cancer Father     DRUG ALLERGIES:  Allergies  Allergen Reactions  . Codeine Itching    REVIEW OF SYSTEMS:   CONSTITUTIONAL: No fever, fatigue or weakness.  EYES: No blurred or double  vision.  EARS, NOSE, AND THROAT: No tinnitus or ear pain.  RESPIRATORY: No cough, shortness of breath, wheezing or hemoptysis.  CARDIOVASCULAR: No chest pain, orthopnea, edema.  GASTROINTESTINAL: No nausea, vomiting, diarrhea or abdominal pain.  GENITOURINARY: No dysuria, hematuria.  ENDOCRINE: No polyuria, nocturia,  HEMATOLOGY: No anemia, easy bruising or bleeding SKIN: No rash or lesion. MUSCULOSKELETAL: Acute right leg pain.  No joint pain or arthritis.   NEUROLOGIC: No tingling, numbness, weakness.  PSYCHIATRY: No anxiety or depression.   MEDICATIONS AT HOME:  Prior to Admission medications   Medication Sig Start Date End Date Taking? Authorizing Provider  ALPRAZolam (XANAX) 0.25 MG tablet Take 1 tablet at bedtime by mouth. 09/07/17  Yes [provider]  amLODipine (NORVASC) 2.5 MG tablet Take 2.5 mg daily by mouth.  01/07/16  Yes [provider]  aspirin EC 81 MG tablet Take 81 mg daily by mouth.    Yes [provider]  Cholecalciferol (VITAMIN D3) 1000 units CAPS Take 1 capsule daily by mouth.    Yes [provider]  diphenhydrAMINE (BENADRYL) 25 mg capsule Take 25 mg at bedtime by mouth.    Yes [provider]  hydrochlorothiazide (HYDRODIURIL) 25 MG tablet Take 25 mg daily by mouth.  10/31/16  Yes [provider]  pravastatin (PRAVACHOL) 20 MG tablet Take 20 mg daily by mouth.  08/30/16  Yes [provider]  ranitidine (ZANTAC) 150 MG tablet Take 150 mg at bedtime by mouth.    Yes [provider]  tamsulosin (FLOMAX) 0.4 MG CAPS capsule Take 0.4 mg daily by mouth.  11/02/16 11/02/17 Yes [provider]      PHYSICAL EXAMINATION:   VITAL SIGNS: Blood pressure (!) 122/58, pulse 64, temperature 97.7 F (36.5 C), temperature source Oral, resp. rate 17, height 5\' 5"  (1.651 m), weight 62.6 kg (138 lb), SpO2 100 %.  GENERAL:  81 y.o.-year-old patient lying in the bed with no acute distress.  Frail  appearance EYES: Pupils equal, round, reactive to light and accommodation. No scleral icterus. Extraocular muscles intact.  HEENT: Head atraumatic, normocephalic. Oropharynx and nasopharynx clear.  NECK:  Supple, no jugular venous distention. No thyroid enlargement, no tenderness.  LUNGS: Normal breath sounds bilaterally, no wheezing, rales,rhonchi or crepitation. No use of accessory muscles of respiration.  CARDIOVASCULAR: S1, S2 normal. No murmurs, rubs, or gallops.  Nonpalpable pulses to the right lower extremity ABDOMEN: Soft, nontender, nondistended. Bowel sounds present. No organomegaly or mass.  EXTREMITIES: No pedal edema, cyanosis, or clubbing.  NEUROLOGIC: Cranial nerves II through XII are intact. MAES. Sensation intact. Gait not checked.  PSYCHIATRIC: The patient is alert and oriented x 3.  SKIN: No obvious rash, lesion, or ulcer.  Right leg is cool to touch.  LABORATORY PANEL:   CBC Recent Labs  Lab 09/20/17 1133  WBC 7.5  HGB 16.0  HCT 47.4  PLT 187  MCV 87.0  MCH 29.3  MCHC 33.7  RDW 14.0  LYMPHSABS 1.2  MONOABS 0.5  EOSABS 0.4  BASOSABS 0.1   ------------------------------------------------------------------------------------------------------------------  Chemistries  Recent Labs  Lab 09/20/17 1133  NA 139  K 3.6  CL 103  CO2 26  GLUCOSE 104*  BUN 26*  CREATININE 1.44*  CALCIUM 9.2   ------------------------------------------------------------------------------------------------------------------ estimated creatinine clearance is 36.8 mL/min (A) (by C-G formula based on SCr of 1.44 mg/dL (H)). ------------------------------------------------------------------------------------------------------------------ No results for input(s): TSH, T4TOTAL, T3FREE, THYROIDAB in the last 72 hours.  Invalid input(s): FREET3   Coagulation profile Recent Labs  Lab 09/20/17 1133  INR 0.93    ------------------------------------------------------------------------------------------------------------------- No results for input(s): DDIMER in the last 72 hours. -------------------------------------------------------------------------------------------------------------------  Cardiac Enzymes No results for input(s): CKMB, TROPONINI, MYOGLOBIN in the last 168 hours.  Invalid input(s): CK ------------------------------------------------------------------------------------------------------------------ Invalid input(s): POCBNP  ---------------------------------------------------------------------------------------------------------------  Urinalysis    Component Value Date/Time   COLORURINE Yellow 03/13/2013 1403   APPEARANCEUR Clear 03/13/2013 1403   LABSPEC 1.014 03/13/2013 1403   PHURINE 5.0 03/13/2013 1403   GLUCOSEU Negative 03/13/2013 1403   HGBUR 3+ 03/13/2013 1403   BILIRUBINUR Negative 03/13/2013 1403   KETONESUR Negative 03/13/2013 1403   PROTEINUR Negative 03/13/2013 1403   NITRITE Negative 03/13/2013 1403   LEUKOCYTESUR Negative 03/13/2013 1403     RADIOLOGY: Ct Angio Aortobifemoral W And/or Wo Contrast  Result Date: 09/20/2017 CLINICAL DATA:  Right lower extremity pain and edema. History of PAD post arterial stenting. Evaluate for arterial thrombus. EXAM: CT ANGIOGRAPHY OF ABDOMINAL AORTA WITH ILIOFEMORAL RUNOFF TECHNIQUE: Multidetector CT imaging of the abdomen, pelvis and lower extremities was performed using the standard protocol during bolus administration of intravenous contrast. Multiplanar CT image reconstructions and MIPs were obtained to evaluate the vascular anatomy. CONTRAST:  90mL ISOVUE-370 IOPAMIDOL (ISOVUE-370) INJECTION 76% COMPARISON:  CT the chest, abdomen and pelvis - 03/13/2013; CT abdomen pelvis - 11/23/2019; 05/05/2010 FINDINGS: VASCULAR Aorta: There is a moderate to large amount of irregular mixed calcified and noncalcified  atherosclerotic plaque throughout the abdominal aorta. There is a nonocclusive intimal web within the infrarenal abdominal aorta (coronal image 54,  series 8), not resulting in hemodynamically significant stenosis. Re- demonstrated saccular infrarenal abdominal aortic aneurysm arising from the anterior aspect of the abdominal aorta measuring approximately 3.6 x 3.5 x 3.8 cm as measured in greatest oblique short axis axial (image 66, series 7), coronal (image 42, series 8) and sagittal (image 75, series 9) dimensions respectively, previously, 3.2 cm in maximal diameter when compared to the 03/2013 examination. No periaortic stranding. Celiac: There is mixed calcified and noncalcified atherosclerotic plaque involving the origin and main trunk of the celiac artery, not resulting in hemodynamically significant stenosis. The main trunk of the celiac artery is tortuous. There is mild ectasia of the distal aspect of the main trunk of the celiac artery measuring approximately 1.0 x 1.1 cm (coronal image 49, series 6, sagittal image 87, series 9). SMA: There is a moderate amount of E centric mixed calcified and noncalcified atherosclerotic plaque involving the origin and proximal aspect of the SMA, likely resulting at least 50% luminal narrowing (image 87, series 9). Renals: Solitary bilaterally ; the right renal artery is slightly atrophic in comparison the left, likely secondary to atrophy of the right kidney. There is a moderate amount of mixed calcified and noncalcified atherosclerotic plaque involving the origin and proximal aspects of the bilateral renal arteries, not definitely resulting in hemodynamically significant stenosis. IMA: Occluded at its origin with early reconstitution via collateral supply from the SMA. RIGHT Lower Extremity Inflow: There is a large amount of mixed calcified and noncalcified atherosclerotic plaque involving the right common iliac artery likely resulting in focal approximately 50% luminal  narrowing (axial image 90, series 7). The right internal iliac artery is disease though patent of normal caliber. Vascular stent within the right external iliac artery appears widely patent without evidence of in stent stenosis. Outflow: There is a moderate amount of mixed calcified and noncalcified atherosclerotic plaque within the right common femoral artery which approaches approximately 50% luminal narrowing (image 120, series 7). The right deep femoral artery appears widely patent. There is mixed calcified and noncalcified atherosclerotic plaque involving the origin of the right superficial femoral artery approach 50% luminal narrowing (image 138, series 7). There is scattered mixed calcified and noncalcified atherosclerotic plaque throughout the remainder of the right superficial femoral artery with potential focal hemodynamically significant stenosis at its distal aspect above the 80 adductor canal (image 194, series 7). Mixed calcified and noncalcified atherosclerotic plaque results in at least 50% luminal narrowing involving the right above knee popliteal artery (image 248, series 7). Age-indeterminate occlusion involving the distal aspect of the right popliteal artery (image 263, series 7) Runoff: The right below-knee popliteal artery extends to involve the tibioperoneal trunk. There is reconstitution of the proximal aspects of the tibial vasculature at the level the proximal calf. There is short-segment occlusion involving the proximal aspect of the right posterior tibial artery (image 54, series 7) with early reconstitution. The right posterior tibial artery serves as the predominant arterial supply to the right lower leg and foot. The right peroneal artery occludes shortly after its origin with atretic short-segment reconstitution and eventual occlusion at the level of the right lower leg. The right anterior tibial artery occludes at the level of the mid/lower leg. A patent right dorsalis pedis artery  is not identified. LEFT Lower Extremity Inflow: Moderate large amount of mixed calcified and noncalcified atherosclerotic plaque involving the right common external iliac artery's, not definitely resulting in hemodynamically significant stenosis. The right internal iliac artery is heavily disease though patent of normal caliber. Outflow:  Mixed calcified and noncalcified atherosclerotic plaque involving the left common femoral artery approaches 50% luminal narrowing (image 128, series 7). The left deep femoral artery appears patent throughout its imaged course. There is a stent within the distal aspect of the left superficial femoral artery. There is a minimal amount of in stent stenosis (image 190, series 7 come not definitely resulting in hemodynamically significant stenosis. Mixed calcified and noncalcified atherosclerotic plaque results in at least 50% luminal narrowing involving the left above knee popliteal artery (image 256, series 7 as well as the left below-knee popliteal artery (image 270, series 7). Runoff: Three-vessel runoff to the left lower leg however the left anterior tibial artery occludes above the level of the ankle mortise (image 367, series 7). Predominant arterial supply to the left foot is via the posterior tibial artery. A patent left dorsalis pedis artery is not identified. Veins: The IVC and pelvic venous system appears widely patent on this arterial phase examination. Review of the MIP images confirms the above findings. _________________________________________________________ NON-VASCULAR Lower chest: Limited visualization of the lower thorax demonstrates minimal dependent subpleural ground-glass atelectasis. No discrete focal airspace opacities. No pleural effusion. Normal heart size. Coronary artery calcifications. No pericardial effusion. Hepatobiliary: Normal hepatic contour. No discrete hyperenhancing hepatic lesions. Normal appearance of the gallbladder given degree distention. No  radiopaque gallstones. No intra extrahepatic bili duct dilatation. No ascites. Pancreas: Normal appearance of the pancreas Spleen: Normal appearance of the spleen Adrenals/Urinary Tract: There is symmetric enhancement of the bilateral kidneys there is asymmetric atrophy involving the right kidney in comparison to the left, likely secondary to a right UPJ stenosis. No definite renal stones this postcontrast examination. No discrete renal lesions. There is a minimal amount of grossly some symmetric bilateral perinephric stranding. Normal appearance the bilateral adrenal glands. Normal appearance of the urinary bladder given degree distention. Stomach/Bowel: Rather extensive colonic diverticulosis without evidence of superimposed acute diverticulitis. Moderate colonic stool burden without evidence of enteric obstruction. Normal appearance of the terminal ileum and appendix. No pneumoperitoneum, pneumatosis or portal venous gas. Lymphatic: No bulky retroperitoneal, mesenteric, pelvic or inguinal lymphadenopathy. Reproductive: Dystrophic calcifications and normal sized prostate gland. No free fluid in the pelvic cul-de-sac. Other: Regional soft tissues appear normal. Musculoskeletal: Moderate scoliotic curvature of the thoracolumbar spine with dominant caudal component convex the right. Moderate severe multilevel lumbar spine DDD with disc space height loss, endplate irregularity and sclerosis. IMPRESSION: VASCULAR 1. Moderate to large amount of atherosclerotic plaque within the abdominal aorta, not resulting in hemodynamically significant stenosis. Aortic Atherosclerosis (ICD10-I70.0). 2. Increase in size of saccular infrarenal abdominal aortic aneurysm, now measuring 3.8 cm in maximal diameter, previously, 3.2 cm (when compared to the 03/2013 examination). No periaortic stranding. Aortic aneurysm NOS (ICD10-I71.9). 3. Suspected hemodynamically significant stenosis involving the origin and proximal aspect the SMA. The  IMA is occluded at its origin with early reconstitution via SMA collaterals. Correlation for symptoms of chronic mesenteric ischemia is recommended. Right lower extremity vasculature: 1. Age-indeterminate, though potentially acute or acute on chronic, occlusion of the right below-knee popliteal artery extending to involve the tibioperoneal trunk. 2. There is short-segment occlusion of the right posterior tibial artery with early reconstitution. The right posterior tibial artery serves as the predominant arterial supply to the right lower leg and foot. A patent right-sided dorsalis pedis artery is not identified. 3. Suspected hemodynamically significant stenosis involving the right common iliac artery. 4. Right external iliac arterial stent appears widely patent without evidence of in-stent stenosis. 5. Irregular mixed calcified and  noncalcified atherosclerotic plaque results in focal at least 50% luminal narrowing of the right common femoral, the proximal and distal aspects of the right superficial femoral and the right above knee popliteal arteries. Left lower extremity vasculature: *Minimal amount of in-stent stenosis within the distal left superficial femoral arterial stent, not definitely resulting in a hemodynamically significant stenosis. *Atherosclerotic plaque results in approximately 50% luminal narrowing involving the left common femoral and the left above and below-knee popliteal arteries. *Three-vessel runoff to the left lower leg, however the left anterior tibial artery occludes above the level of the ankle mortise. The predominant arterial supply to the left foot is via the left posterior tibial artery. A patent left-sided dorsalis pedis artery is not identified. NON-VASCULAR 1. 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 2. Extensive colonic diverticulosis without evidence  of diverticulitis. Critical Value/emergent results were called by telephone at the time of interpretation on 09/20/2017 at 2:45 pm to Dr. Willy EddyPATRICK ROBINSON , who verbally acknowledged these results. Electronically Signed   By: Simonne ComeJohn  Watts M.D.   On: 09/20/2017 15:16    EKG: Orders placed or performed during the hospital encounter of 09/20/17  . ED EKG  . ED EKG  . EKG 12-Lead  . EKG 12-Lead    IMPRESSION AND PLAN: 1 acute right lower extremity limb ischemia Noted history of peripheral vascular disease Currently without leg pain. CT angiogram noted above for acute on chronic occlusion of the right popliteal artery, right posterior tibial artery occlusion proximally with reconstitution distally Admit to regular nursing floor bed, vascular surgery to see-probable intervention on tomorrow, n.p.o. after midnight, statin therapy-check lipids every a.m., dull pain protocol, continue heparin drip, continue close medical monitoring  2 chronic benign essential hypertension Stable  MAR will need to be completed when available  3 chronic hyperlipidemia, unspecified Statin therapy and check lipids in the morning  4 history of coronary artery disease Stable MAR will need to be completed when available Nitroglycerin as needed chest pain, morphine as needed breakthrough pain  Full code Condition stable Prognosis fair DVT prophylaxis-on heparin drip Disposition Home in 2-3 days with vascular clearance  All the records are reviewed and case discussed with ED provider. Management plans discussed with the patient, family and they are in agreement.  CODE STATUS: Code Status History    This patient does not have a recorded code status. Please follow your organizational policy for patients in this situation.    Advance Directive Documentation     Most Recent Value  Type of Advance Directive  Healthcare Power of Attorney, Living will  Pre-existing out of facility DNR order (yellow form or pink  MOST form)  No data  "MOST" Form in Place?  No data       TOTAL TIME TAKING CARE OF THIS PATIENT: 445 minutes.    Evelena AsaMontell D Salary M.D on 09/20/2017   Between 7am to 6pm - Pager - [phone]  After 6pm go to [email or web address] - password [email or web address]  [facility] [place] [facility]  Office  [phone]  CC: Primary care physician; Mick SellFitzgerald, David P, MD   Note: This dictation was prepared with Dragon dictation along with smaller phrase technology. Any transcriptional errors that result from this process are unintentional.

## 2017-09-20 NOTE — ED Notes (Signed)
Turned on TV for patient. Pt alert and oriented X4, active, cooperative, pt in NAD. RR even and unlabored, color WNL.

## 2017-09-20 NOTE — Progress Notes (Signed)
ANTICOAGULATION CONSULT NOTE - Initial Consult  Pharmacy Consult for Heparin Drip Indication: VTE treatment  Allergies  Allergen Reactions  . Codeine Itching    Patient Measurements: Height: 5\' 5"  (165.1 cm) Weight: 138 lb (62.6 kg) IBW/kg (Calculated) : 61.5 Heparin Dosing Weight: 62.6 kg  Vital Signs: Temp: 97.7 F (36.5 C) (11/14 1055) Temp Source: Oral (11/14 1055) BP: 131/60 (11/14 1145) Pulse Rate: 67 (11/14 1145)  Labs: Recent Labs    09/20/17 1133  HGB 16.0  HCT 47.4  PLT 187  APTT 28  LABPROT 12.4  INR 0.93  CREATININE 1.44*    Estimated Creatinine Clearance: 36.8 mL/min (A) (by C-G formula based on SCr of 1.44 mg/dL (H)).   Medical History: Past Medical History:  Diagnosis Date  . Coronary artery disease   . Hyperlipidemia   . Hypertension   . Vascular abnormality     Medications:  Scheduled:   Infusions:  . heparin 1,100 Units/hr (09/20/17 1158)    Assessment: Pharmacy consulted to dose Heparin for this 81 yo male who was sent from PCP with possible clot in right leg, pt c/o numbness and pain in the right LE since yesterday.   Goal of Therapy:  Heparin level 0.3-0.7 units/ml Monitor platelets by anticoagulation protocol: Yes   Plan:  Give 4000 units bolus x 1 Start heparin infusion at 1100 units/hr Check anti-Xa level in 8 hours and daily while on heparin Continue to monitor H&H and platelets   HL ordered for tonight at 20:00.   Lindsey Demonte K, RPH 09/20/2017,1:47 PM

## 2017-09-20 NOTE — Progress Notes (Signed)
ANTICOAGULATION CONSULT NOTE - Initial Consult  Pharmacy Consult for Heparin Drip Indication: VTE treatment  Allergies  Allergen Reactions  . Codeine Itching    Patient Measurements: Height: 5\' 5"  (165.1 cm) Weight: 138 lb (62.6 kg) IBW/kg (Calculated) : 61.5 Heparin Dosing Weight: 62.6 kg  Vital Signs: Temp: 97.7 F (36.5 C) (11/14 1055) Temp Source: Oral (11/14 1055) BP: 130/61 (11/14 2100) Pulse Rate: 72 (11/14 2100)  Labs: Recent Labs    09/20/17 1133 09/20/17 2040  HGB 16.0  --   HCT 47.4  --   PLT 187  --   APTT 28  --   LABPROT 12.4  --   INR 0.93  --   HEPARINUNFRC  --  0.75*  CREATININE 1.44*  --     Estimated Creatinine Clearance: 36.8 mL/min (A) (by C-G formula based on SCr of 1.44 mg/dL (H)).   Medical History: Past Medical History:  Diagnosis Date  . Coronary artery disease   . Hyperlipidemia   . Hypertension   . Vascular abnormality     Medications:  Scheduled:  . ALPRAZolam  0.25 mg Oral QHS  . amLODipine  2.5 mg Oral Daily  . [START ON 09/21/2017] atorvastatin  20 mg Oral q1800  . diphenhydrAMINE  25 mg Oral QHS  . famotidine  20 mg Oral Daily  . sodium chloride flush  3 mL Intravenous Q12H  . tamsulosin  0.4 mg Oral Daily  . Vitamin D3  1 capsule Oral Daily   Infusions:  . [START ON 09/21/2017]  ceFAZolin (ANCEF) IV    . [START ON 09/21/2017] dextrose 5% lactated ringers    . heparin 1,100 Units/hr (09/20/17 1158)    Assessment: Pharmacy consulted to dose Heparin for this 81 yo male who was sent from PCP with possible clot in right leg, pt c/o numbness and pain in the right LE since yesterday.   Goal of Therapy:  Heparin level 0.3-0.7 units/ml Monitor platelets by anticoagulation protocol: Yes   Plan:  11/14 2040 HL supratherapeutic at 0.75. Will decrease heparin drip to 1000units/hr and recheck HL in 8 hours.   Gardner CandleSheema M Faraz Ponciano, PharmD, BCPS Clinical Pharmacist 09/20/2017 9:28 PM

## 2017-09-20 NOTE — ED Notes (Signed)
Pt family going to get patient food to eat for dinner. Given coffee. Denies offered sandwich tray.

## 2017-09-20 NOTE — ED Notes (Signed)
Pt updated that CT scan pending read

## 2017-09-20 NOTE — ED Triage Notes (Signed)
Pt was sent from PCP with possible clot in right leg, pt c/o numbness and pain in the right LE since yesterday. No pedal pulse noted with palpated or with doppler.

## 2017-09-20 NOTE — ED Notes (Signed)
Pt in Ct at this time

## 2017-09-20 NOTE — Progress Notes (Signed)
Patient seen and examined  No pulses right popliteal DP or PT there is a palpable posterior tibial pulse on the left.  CT angiogram demonstrates occlusion of the popliteal with diffuse atherosclerotic disease and previous interventions in association with a moderate sized aneurysm.  At the present time the patient states he is not having any pain he does have some motor function and sensory function.  Patient will be maintained on a heparin drip overnight Dr. Wyn Quakerew will proceed with revascularization in the morning.  Full consult to follow

## 2017-09-20 NOTE — ED Notes (Signed)
Report to Rebecca, RN.

## 2017-09-20 NOTE — ED Notes (Signed)
Reports numbness to right foot that started yesterday. States gait is off due to numbness feeling of right foot.. Denies CP or SOB. Pt does not take blood thinners. Pt takes 81mg  ASA daily. Pt walks daily and is very active. Pt alert and oriented X4, active, cooperative, pt in NAD. RR even and unlabored, color WNL.

## 2017-09-20 NOTE — ED Notes (Signed)
Pt resting in bed at this time; family at bedside, TV on.

## 2017-09-20 NOTE — ED Notes (Signed)
Consulting vascular surgeon at bedside

## 2017-09-20 NOTE — ED Provider Notes (Signed)
[facility] Emergency Department Provider Note    First MD Initiated Contact with Patient 09/20/17 1113     (approximate)  I have reviewed the triage vital signs and the nursing notes.   HISTORY  Chief Complaint Numbness    HPI Bryan Welch is a 81 y.o. male [place] of significant peripheral arterial disease status post stenting of the right femoral artery presents with chief complaint of aching moderate to severe pain in his right lower extremity starting yesterday at around 1130 after he had just finished walking in [place].  Patient no longer smokes.  He does take a baby aspirin daily.  Is not on any other anticoagulation.  Says that the pain is constant at this point.  Denies any chest pain.  No palpitations.  Past Medical History:  Diagnosis Date  . Coronary artery disease   . Hyperlipidemia   . Hypertension   . Vascular abnormality    Family History  Problem Relation Age of Onset  . Cancer Mother   . Cancer Father    Past Surgical History:  Procedure Laterality Date  . BACK SURGERY    . CAROTID STENT Right   . LEG SURGERY Bilateral    stent placement   Patient Active Problem List   Diagnosis Date Noted  . PAD (peripheral artery disease) (HCC) 04/05/2017  . AAA (abdominal aortic aneurysm) without rupture (HCC) 04/05/2017  . Hyperlipidemia 11/15/2016  . Essential hypertension, benign 11/15/2016  . Carotid stenosis 11/15/2016      Prior to Admission medications   Medication Sig Start Date End Date Taking? Authorizing Provider  ALPRAZolam (XANAX) 0.25 MG tablet Take 1 tablet at bedtime by mouth. 09/07/17  Yes [provider]  amLODipine (NORVASC) 2.5 MG tablet Take 2.5 mg daily by mouth.  01/07/16  Yes [provider]  aspirin EC 81 MG tablet Take 81 mg daily by mouth.    Yes [provider]  Cholecalciferol (VITAMIN D3) 1000 units CAPS Take 1 capsule daily by mouth.    Yes [provider]    diphenhydrAMINE (BENADRYL) 25 mg capsule Take 25 mg at bedtime by mouth.    Yes [provider]  hydrochlorothiazide (HYDRODIURIL) 25 MG tablet Take 25 mg daily by mouth.  10/31/16  Yes [provider]  pravastatin (PRAVACHOL) 20 MG tablet Take 20 mg daily by mouth.  08/30/16  Yes [provider]  ranitidine (ZANTAC) 150 MG tablet Take 150 mg at bedtime by mouth.    Yes [provider]  tamsulosin (FLOMAX) 0.4 MG CAPS capsule Take 0.4 mg daily by mouth.  11/02/16 11/02/17 Yes [provider]    Allergies Codeine    Social History Social History   Tobacco Use  . Smoking status: Former Games developer  . Smokeless tobacco: Never Used  Substance Use Topics  . Alcohol use: Yes    Comment: occasionally  . Drug use: No    Review of Systems Patient denies headaches, rhinorrhea, blurry vision, numbness, shortness of breath, chest pain, edema, cough, abdominal pain, nausea, vomiting, diarrhea, dysuria, fevers, rashes or hallucinations unless otherwise stated above in HPI. ____________________________________________   PHYSICAL EXAM:  VITAL SIGNS: Vitals:   09/20/17 1145 09/20/17 1417  BP: 131/60 (!) 122/58  Pulse: 67 64  Resp: 17 17  Temp:    SpO2: 96% 100%    Constitutional: Alert and oriented. Well appearing and in no acute distress. Eyes: Conjunctivae are normal.  Head: Atraumatic. Nose: No congestion/rhinnorhea. Mouth/Throat: Mucous membranes  are moist.   Neck: No stridor. Painless ROM.  Cardiovascular: Normal rate, regular rhythm. Grossly normal heart sounds.  Good peripheral circulation. Respiratory: Normal respiratory effort.  No retractions. Lungs CTAB. Gastrointestinal: Soft and nontender. No distention. No abdominal bruits. No CVA tenderness. Genitourinary:  Musculoskeletal: Right lower extremity is cool to touch with decreased cap refill of the toes.  There is a palpable full popliteal and femoral pulse but unable to palpate a  DP or PT pulse in the right lower extremity.  There is a monophasic week Doppler signal of the PT but there is absent DP. Neurologic:  Normal speech and language. No gross focal neurologic deficits are appreciated. No facial droop Skin:  Skin is warm, dry and intact. No rash noted. Psychiatric: Mood and affect are normal. Speech and behavior are normal.  ____________________________________________   LABS (all labs ordered are listed, but only abnormal results are displayed)  Results for orders placed or performed during the hospital encounter of 09/20/17 (from the past 24 hour(s))  CBC with Differential/Platelet     Status: None   Collection Time: 09/20/17 11:33 AM  Result Value Ref Range   WBC 7.5 3.8 - 10.6 K/uL   RBC 5.45 4.40 - 5.90 MIL/uL   Hemoglobin 16.0 13.0 - 18.0 g/dL   HCT 78.247.4 95.640.0 - 21.352.0 %   MCV 87.0 80.0 - 100.0 fL   MCH 29.3 26.0 - 34.0 pg   MCHC 33.7 32.0 - 36.0 g/dL   RDW 08.614.0 57.811.5 - 46.914.5 %   Platelets 187 150 - 440 K/uL   Neutrophils Relative % 71 %   Neutro Abs 5.4 1.4 - 6.5 K/uL   Lymphocytes Relative 16 %   Lymphs Abs 1.2 1.0 - 3.6 K/uL   Monocytes Relative 7 %   Monocytes Absolute 0.5 0.2 - 1.0 K/uL   Eosinophils Relative 5 %   Eosinophils Absolute 0.4 0 - 0.7 K/uL   Basophils Relative 1 %   Basophils Absolute 0.1 0 - 0.1 K/uL  Lactic acid, plasma     Status: None   Collection Time: 09/20/17 11:33 AM  Result Value Ref Range   Lactic Acid, Venous 1.6 0.5 - 1.9 mmol/L  Basic metabolic panel     Status: Abnormal   Collection Time: 09/20/17 11:33 AM  Result Value Ref Range   Sodium 139 135 - 145 mmol/L   Potassium 3.6 3.5 - 5.1 mmol/L   Chloride 103 101 - 111 mmol/L   CO2 26 22 - 32 mmol/L   Glucose, Bld 104 (H) 65 - 99 mg/dL   BUN 26 (H) 6 - 20 mg/dL   Creatinine, Ser 6.291.44 (H) 0.61 - 1.24 mg/dL   Calcium 9.2 8.9 - 52.810.3 mg/dL   GFR calc non Af Amer 45 (L) >60 mL/min   GFR calc Af Amer 52 (L) >60 mL/min   Anion gap 10 5 - 15  Protime-INR      Status: None   Collection Time: 09/20/17 11:33 AM  Result Value Ref Range   Prothrombin Time 12.4 11.4 - 15.2 seconds   INR 0.93   APTT     Status: None   Collection Time: 09/20/17 11:33 AM  Result Value Ref Range   aPTT 28 24 - 36 seconds   ____________________________________________  EKG My review and personal interpretation at Time: 11:51   Indication: leg numbness  Rate: 70  Rhythm: sinus Axis: normal Other: inferior st elevation <501mm without reciprocal changes, norml intervals ____________________________________________  RADIOLOGY  I  personally reviewed all radiographic images ordered to evaluate for the above acute complaints and reviewed radiology reports and findings.  These findings were personally discussed with the patient.  Please see medical record for radiology report.  ____________________________________________   PROCEDURES  Procedure(s) performed:  Procedures    Critical Care performed: yes CRITICAL CARE Performed by: Willy EddyPatrick Kimani Bedoya   Total critical care time: 45 minutes  Critical care time was exclusive of separately billable procedures and treating other patients.  Critical care was necessary to treat or prevent imminent or life-threatening deterioration.  Critical care was time spent personally by me on the following activities: development of treatment plan with patient and/or surrogate as well as nursing, discussions with consultants, evaluation of patient's response to treatment, examination of patient, obtaining history from patient or surrogate, ordering and performing treatments and interventions, ordering and review of laboratory studies, ordering and review of radiographic studies, pulse oximetry and re-evaluation of patient's condition.  ____________________________________________   INITIAL IMPRESSION / ASSESSMENT AND PLAN / ED COURSE  Pertinent labs & imaging results that were available during my care of the patient were reviewed  by me and considered in my medical decision making (see chart for details).  DDX: ischemic limb, dissection, embolism, pad, dysrhythmia  Bryan Welch is a 81 y.o. who presents to the ED with cold and painful leg since yesterday.  Diminished pulses by Doppler signal.  Does have warm and well perfused left lower extremity.  No palpable aneurysm of the right femoral.  Does have pulses of the right popliteal therefore I do suspect some component of embolic or thrombotic pathology.  Will start on heparin and will order CT.  The patient will be placed on continuous pulse oximetry and telemetry for monitoring.  Laboratory evaluation will be sent to evaluate for the above complaints.     Clinical Course as of Sep 20 1504  Wed Sep 20, 2017  1227 I spoke with vascular surgery notified the surgeon Dr. Marena ChancySchneier regarding the patient's borderline renal insufficiency.  Given his acute presentation I will provide IV hydration and in this setting will proceed with CT angiogram for further characterization and evaluation of his acute pain.  [PR]    Clinical Course User Index [PR] Willy Eddyobinson, Eriyana Sweeten, MD    ----------------------------------------- 3:05 PM on 09/20/2017 -----------------------------------------  Patient with evidence of acute embolic versus thrombotic pathology explain the patient's symptoms.  Remains hemodynamically stable currently on a heparin drip.  I spoke with hospitalist for medical admission.  Vascular surgery will consult. ____________________________________________   FINAL CLINICAL IMPRESSION(S) / ED DIAGNOSES  Final diagnoses:  Rest pain of left upper extremity due to atherosclerosis (HCC)  Critical lower limb ischemia      NEW MEDICATIONS STARTED DURING THIS VISIT:  This SmartLink is deprecated. Use AVSMEDLIST instead to display the medication list for a patient.   Note:  This document was prepared using Dragon voice recognition software and may include  unintentional dictation errors.    Willy Eddyobinson, Roselynn Whitacre, MD 09/20/17 [id number]

## 2017-09-21 ENCOUNTER — Encounter
Admission: EM | Disposition: A | Discharge status: Home / Self Care | Payer: Self-pay | Source: Home / Self Care | Attending: Internal Medicine

## 2017-09-21 DIAGNOSIS — I70221 Atherosclerosis of native arteries of extremities with rest pain, right leg: Secondary | ICD-10-CM

## 2017-09-21 HISTORY — PX: PERIPHERAL VASCULAR BALLOON ANGIOPLASTY: CATH118281

## 2017-09-21 LAB — BASIC METABOLIC PANEL
Anion gap: 9 (ref 5–15)
BUN: 24 mg/dL — AB (ref 6–20)
CO2: 24 mmol/L (ref 22–32)
Calcium: 8.7 mg/dL — ABNORMAL LOW (ref 8.9–10.3)
Chloride: 105 mmol/L (ref 101–111)
Creatinine, Ser: 1.28 mg/dL — ABNORMAL HIGH (ref 0.61–1.24)
GFR calc Af Amer: >60 mL/min (ref >60)
GFR, EST NON AFRICAN AMERICAN: 52 mL/min — AB (ref >60)
GLUCOSE: 100 mg/dL — AB (ref 65–99)
POTASSIUM: 3.3 mmol/L — AB (ref 3.5–5.1)
Sodium: 138 mmol/L (ref 135–145)

## 2017-09-21 LAB — CBC
HEMATOCRIT: 44.6 % (ref 40.0–52.0)
Hemoglobin: 15.4 g/dL (ref 13.0–18.0)
MCH: 30.1 pg (ref 26.0–34.0)
MCHC: 34.4 g/dL (ref 32.0–36.0)
MCV: 87.4 fL (ref 80.0–100.0)
Platelets: 182 10*3/uL (ref 150–440)
RBC: 5.11 MIL/uL (ref 4.40–5.90)
RDW: 14 % (ref 11.5–14.5)
WBC: 6.7 10*3/uL (ref 3.8–10.6)

## 2017-09-21 LAB — LIPID PANEL
CHOL/HDL RATIO: 4.7 ratio
Cholesterol: 159 mg/dL (ref 0–200)
HDL: 34 mg/dL — ABNORMAL LOW (ref >40)
LDL Cholesterol: 47 mg/dL (ref 0–99)
Triglycerides: 388 mg/dL — ABNORMAL HIGH (ref <150)
VLDL: 78 mg/dL — AB (ref 0–40)

## 2017-09-21 LAB — HEPARIN LEVEL (UNFRACTIONATED): Heparin Unfractionated: 0.55 IU/mL (ref 0.30–0.70)

## 2017-09-21 LAB — PROTIME-INR
INR: 1
Prothrombin Time: 13.1 seconds (ref 11.4–15.2)

## 2017-09-21 SURGERY — PERIPHERAL VASCULAR BALLOON ANGIOPLASTY
Anesthesia: Moderate Sedation | Laterality: Right

## 2017-09-21 MED ORDER — CEFAZOLIN SODIUM-DEXTROSE 2-4 GM/100ML-% IV SOLN
INTRAVENOUS | Status: AC
  Filled 2017-09-21: qty 100

## 2017-09-21 MED ORDER — FENTANYL CITRATE (PF) 100 MCG/2ML IJ SOLN
INTRAMUSCULAR | Status: DC | PRN
  Administered 2017-09-21 (×2): 12.5 ug via INTRAVENOUS
  Administered 2017-09-21: 25 ug via INTRAVENOUS
  Administered 2017-09-21: 50 ug via INTRAVENOUS

## 2017-09-21 MED ORDER — ALTEPLASE 2 MG IJ SOLR
INTRAMUSCULAR | Status: AC
  Filled 2017-09-21: qty 8

## 2017-09-21 MED ORDER — LIDOCAINE-EPINEPHRINE (PF) 1 %-1:200000 IJ SOLN
INTRAMUSCULAR | Status: AC
  Filled 2017-09-21: qty 30

## 2017-09-21 MED ORDER — POTASSIUM CHLORIDE CRYS ER 20 MEQ PO TBCR
40.0000 meq | EXTENDED_RELEASE_TABLET | Freq: Once | ORAL | Status: AC
  Administered 2017-09-21: 40 meq via ORAL
  Filled 2017-09-21: qty 2

## 2017-09-21 MED ORDER — NITROGLYCERIN 5 MG/ML IV SOLN
INTRAVENOUS | Status: AC
  Filled 2017-09-21: qty 10

## 2017-09-21 MED ORDER — HEPARIN (PORCINE) IN NACL 2-0.9 UNIT/ML-% IJ SOLN
INTRAMUSCULAR | Status: AC
  Filled 2017-09-21: qty 1000

## 2017-09-21 MED ORDER — TIROFIBAN HCL IN NACL 5-0.9 MG/100ML-% IV SOLN
0.0750 ug/kg/min | INTRAVENOUS | Status: DC
  Filled 2017-09-21 (×3): qty 100

## 2017-09-21 MED ORDER — MIDAZOLAM HCL 5 MG/5ML IJ SOLN
INTRAMUSCULAR | Status: AC
  Filled 2017-09-21: qty 5

## 2017-09-21 MED ORDER — MIDAZOLAM HCL 2 MG/2ML IJ SOLN
INTRAMUSCULAR | Status: AC
  Filled 2017-09-21: qty 2

## 2017-09-21 MED ORDER — NITROGLYCERIN 1 MG/10 ML FOR IR/CATH LAB
INTRA_ARTERIAL | Status: DC | PRN
  Administered 2017-09-21: 250 ug via INTRA_ARTERIAL

## 2017-09-21 MED ORDER — TIROFIBAN HCL IV 12.5 MG/250 ML
INTRAVENOUS | Status: AC
  Filled 2017-09-21: qty 250

## 2017-09-21 MED ORDER — MIDAZOLAM HCL 2 MG/2ML IJ SOLN
INTRAMUSCULAR | Status: DC | PRN
  Administered 2017-09-21 (×2): 1 mg via INTRAVENOUS
  Administered 2017-09-21: 2 mg via INTRAVENOUS
  Administered 2017-09-21: 1 mg via INTRAVENOUS

## 2017-09-21 MED ORDER — HEPARIN SODIUM (PORCINE) 1000 UNIT/ML IJ SOLN
INTRAMUSCULAR | Status: DC | PRN
  Administered 2017-09-21: 5000 [IU] via INTRAVENOUS

## 2017-09-21 MED ORDER — ALTEPLASE 30 MG/30 ML FOR INTERV. RAD
INTRA_ARTERIAL | Status: DC | PRN
  Administered 2017-09-21: 8 mg via INTRA_ARTERIAL

## 2017-09-21 MED ORDER — FENTANYL CITRATE (PF) 100 MCG/2ML IJ SOLN
INTRAMUSCULAR | Status: AC
  Filled 2017-09-21: qty 2

## 2017-09-21 MED ORDER — HEPARIN SODIUM (PORCINE) 1000 UNIT/ML IJ SOLN
INTRAMUSCULAR | Status: AC
  Filled 2017-09-21: qty 1

## 2017-09-21 MED ORDER — TIROFIBAN (AGGRASTAT) BOLUS VIA INFUSION
25.0000 ug/kg | Freq: Once | INTRAVENOUS | Status: AC
  Administered 2017-09-21: 12500 ug via INTRAVENOUS
  Filled 2017-09-21: qty 32

## 2017-09-21 SURGICAL SUPPLY — 26 items
BALLN LUTONIX 5X150X130 (BALLOONS) ×3
BALLN LUTONIX DCB 4X60X130 (BALLOONS) ×3
BALLN ULTRVRSE 3X300X150 (BALLOONS) ×2
BALLN ULTRVRSE 3X300X150 OTW (BALLOONS) ×1
BALLOON LUTONIX 5X150X130 (BALLOONS) ×1 IMPLANT
BALLOON LUTONIX DCB 4X60X130 (BALLOONS) ×1 IMPLANT
BALLOON ULTRVRSE 3X300X150 OTW (BALLOONS) ×1 IMPLANT
CANISTER PENUMBRA MAX (MISCELLANEOUS) ×3 IMPLANT
CATH BEACON 5 .035 65 RIM TIP (CATHETERS) ×3 IMPLANT
CATH BEACON 5 .038 100 VERT TP (CATHETERS) ×3 IMPLANT
CATH IMAGER II S 5FR 65CM (MISCELLANEOUS) ×3 IMPLANT
CATH INDIGO 6 ST-TIP 135CM (CATHETERS) ×2 IMPLANT
CATH INFUS 135CMX30CM (CATHETERS) ×3 IMPLANT
COVER PROBE U/S 5X48 (MISCELLANEOUS) ×3 IMPLANT
DEVICE PRESTO INFLATION (MISCELLANEOUS) ×3 IMPLANT
DEVICE STARCLOSE SE CLOSURE (Vascular Products) ×3 IMPLANT
GLIDEWIRE ADV .035X260CM (WIRE) ×3 IMPLANT
PACK ANGIOGRAPHY (CUSTOM PROCEDURE TRAY) ×3 IMPLANT
SHEATH BRITE TIP 5FRX11 (SHEATH) ×3 IMPLANT
SHEATH PINNACLE ST 6F 65CM (SHEATH) ×3 IMPLANT
STENT VIABAHN 5X150X120 (Permanent Stent) ×3 IMPLANT
TOWEL OR 17X26 4PK STRL BLUE (TOWEL DISPOSABLE) ×3 IMPLANT
TUBING ASPIRATION INDIGO (MISCELLANEOUS) ×2 IMPLANT
TUBING CONTRAST HIGH PRESS 72 (TUBING) ×3 IMPLANT
WIRE G V18X300CM (WIRE) ×3 IMPLANT
WIRE J 3MM .035X145CM (WIRE) ×3 IMPLANT

## 2017-09-21 NOTE — Progress Notes (Signed)
GFR 52 today. Called MD and asked if he would like IV FLUIDS. MD states "no fluids." No new orders. Pt. Asymptomatic. VSS.

## 2017-09-21 NOTE — Op Note (Signed)
[facility] Percutaneous Study/Intervention Procedural Note   Date of Surgery: 09/21/2017  Surgeon(s):Zeppelin Commisso   Assistants:none  Pre-operative Diagnosis: PAD with rest pain, acute on chronic ischemia of RLE  Post-operative diagnosis: Same  Procedure(s) Performed: 1. Ultrasound guidance for vascular access left femoral artery 2. Catheter placement into right posterior tibial artery from left femoral approach 3. Aortogram and selective right lower extremity angiogram 4. Catheter directed thrombolytic therapy with 8 mg of TPA delivered in the right popliteal artery and posterior tibial artery 5. Mechanical thrombectomy with the penumbra cat 6 device to the right popliteal artery and posterior tibial artery  6.  Percutaneous transluminal angioplasty of the posterior tibial artery with a 3 mm diameter by 30 cm length angioplasty balloon as well as a 4 mm diameter by 6 cm length Lutonix drug-coated angioplasty balloon proximally  7.  Viabahn stent placement to the right popliteal artery with 5 mm diameter by 15 cm length Viabahn stent 8. StarClose closure device left femoral artery  EBL: 150 cc  Contrast: 65 cc  Fluoro Time: 12.2 minutes  Moderate Conscious Sedation Time: approximately 75 minutes using 5 mg of Versed and 100 mcg of Fentanyl  Indications: Patient is a 81 y.o.male with acute on chronic ischemia of the right lower extremity with neurologic changes and rest pain. The patient has a CT scan showing occlusion of the right popliteal artery and proximal tibial vessels. The patient is brought in for angiography for further evaluation and potential treatment. Risks and benefits are discussed and informed consent is obtained  Procedure: The patient was identified and appropriate procedural time out was performed. The patient was then placed supine on the table  and prepped and draped in the usual sterile fashion.Moderate conscious sedation was administered during a face to face encounter with the patient throughout the procedure with my supervision of the RN administering medicines and monitoring the patient's vital signs, pulse oximetry, telemetry and mental status throughout from the start of the procedure until the patient was taken to the recovery room. Ultrasound was used to evaluate the left common femoral artery. It was patent . A digital ultrasound image was acquired. A Seldinger needle was used to access the left common femoral artery under direct ultrasound guidance and a permanent image was performed. A 0.035 J wire was advanced without resistance and a 5Fr sheath was placed. Pigtail catheter was placed into the aorta and an AP aortogram was performed. This demonstrated normal renal arteries and an aneurysmal aorta and irregular but not stenotic or aneurysmal iliac segments without significant stenosis. I then crossed the aortic bifurcation and advanced to the right femoral head. Selective right lower extremity angiogram was then performed. This demonstrated the SFA was diffusely irregular but not stenotic.  The profunda femoris artery appeared reasonably normal as did the common femoral artery.  The above-knee popliteal artery was occluded and there was significant calcification but this appeared to be more acute occlusion.  There was faint reconstitution of the peroneal artery and posterior tibial arteries distally. The patient was systemically heparinized and a 6 French 70 cm sheath was then placed over the Terumo Advantage wire. I then used a Kumpe catheter and the advantage wire to navigate through the popliteal occlusion and the posterior tibial occlusion and confirm intraluminal flow in the posterior tibial artery in the mid calf.  Then exchanged for a 0.018 wire.  I attempted to place a 130 cm total length 30 cm working length lysis catheter to  TPA,   but this would not cross the proximal posterior tibial artery due to stenosis.  I went ahead and treated the posterior tibial artery from just above the ankle up to the proximal posterior tibial artery with a 3 mm diameter by 30 cm angioplasty balloon.  This was inflated to 6 atm for 1 minute.  Completion angiogram showed residual poor flow largely due to the popliteal and proximal tibial thrombus.  I was now able to place the lysis catheter and this was parked across the entire popliteal artery and the proximal posterior tibial artery to the mid segment.  8 mg of TPA were then instilled and this was allowed to dwell for 15-20 minutes.  The thrombus burden was reduced and we could now see that the posterior tibial actually had a separate origin at the level of the knee with a common trunk for the anterior tibial artery which was chronically occluded and did not reconstitute distally in the peroneal artery which had a fair reconstitution in the lower leg.  I used the penumbra cat 6 device and made several passes with this to extract thrombus from the popliteal and posterior tibial artery which appeared to be the best runoff distally.  A total of about 100-125 cc of effluent were returned with these multiple passes.  This markedly improved the thrombus burden, but there still remains some thrombus in the popliteal artery with 2 areas of greater than 70% stenosis one just above the knee and one about 6-8 cm more proximal.  There was a large chunk of thrombus now down into the common trunk of the anterior tibial and peroneal artery but the posterior tibial artery was markedly improved.  There was still some stenosis proximally in the posterior tibial artery and I used a 4 mm diameter by 6 cm length Lutonix drug-coated angioplasty balloon inflated to 8 atm for 1 minute to treat this.  I elected to cover the popliteal lesions and thrombus with a 5 mm diameter by 15 cm length covered stent.  The Viabahn stent was  deployed from the level of the knee to the proximal popliteal artery.  This was postdilated with a 5 mm diameter by 15 cm length Lutonix drug-coated angioplasty balloon.  No significant residual stenosis or thrombus was residual and popliteal artery.  Most of the thrombus was now down in the common trunk of the anterior tibial and peroneal arteries although the peroneal artery did reconstitute through collaterals beyond this.  The posterior tibial artery showed spasm with small vessels in the foot and ankle area, but no greater than 30% residual stenosis after the thrombectomy and angioplasty.  I elected to terminate the procedure. The sheath was removed and StarClose closure device was deployed in the left femoral artery with excellent hemostatic result. The patient was taken to the recovery room in stable condition having tolerated the procedure well.  Findings:  Aortogram: This demonstrated normal renal arteries and an aneurysmal aorta and irregular but not stenotic or aneurysmal iliac segments without significant stenosis. Right Lower Extremity:The SFA was diffusely irregular but not stenotic.  The profunda femoris artery appeared reasonably normal as did the common femoral artery.  The above-knee popliteal artery was occluded and there was significant calcification but this appeared to be more acute occlusion.  There was faint reconstitution of the peroneal artery and posterior tibial arteries distally.   Disposition: Patient was taken to the recovery room in stable condition having tolerated the procedure well.  Complications: None  [name] 09/21/2017 12:16  PM   This note was created with Dragon Medical transcription system. Any errors in dictation are purely unintentional.

## 2017-09-21 NOTE — H&P (Signed)
[facility] History & Physical Update  The patient was interviewed and re-examined.  [name] previous History and Physical has been reviewed and is unchanged.  There is no change in the plan of care. We plan to proceed with the scheduled procedure.  Festus BarrenJason Dew, MD  09/21/2017, 10:03 AM

## 2017-09-21 NOTE — Progress Notes (Signed)
Talked to Dr. Sherryll BurgerShah about patient's left femoral site has scant bleeding, held pressure for 5 minutes and reinforce dressing with silk tape. Aggrastat drip at 5.7, per MD to call Dr. Wyn Quakerew if bleeding is worst and scant bleeding is expected within a few hours. No other concern at the moment. RN will continue to monitor.

## 2017-09-21 NOTE — Progress Notes (Signed)
Patient was transferred from specials, Aggrastat dip at 5.7, PAD in left groin has 40 cc of air, and per Olegario MessierKathy, RN PAD will be left until Aggrastat drip is running, dressing has small drainage. Patient denies pain. No other concern at the moment. RN will continue to monitor.

## 2017-09-21 NOTE — Progress Notes (Signed)
ANTICOAGULATION CONSULT NOTE - Initial Consult  Pharmacy Consult for Heparin Drip Indication: VTE treatment  Allergies  Allergen Reactions  . Codeine Itching    Patient Measurements: Height: 5\' 5"  (165.1 cm) Weight: 140 lb 3.2 oz (63.6 kg) IBW/kg (Calculated) : 61.5 Heparin Dosing Weight: 62.6 kg  Vital Signs: Temp: 98.4 F (36.9 C) (11/14 2228) Temp Source: Oral (11/14 2228) BP: 158/57 (11/14 2228) Pulse Rate: 72 (11/14 2228)  Labs: Recent Labs    09/20/17 1133 09/20/17 2040 09/21/17 0510  HGB 16.0  --  15.4  HCT 47.4  --  44.6  PLT 187  --  182  APTT 28  --   --   LABPROT 12.4  --  13.1  INR 0.93  --  1.00  HEPARINUNFRC  --  0.75* 0.55  CREATININE 1.44*  --  1.28*    Estimated Creatinine Clearance: 41.4 mL/min (A) (by C-G formula based on SCr of 1.28 mg/dL (H)).   Medical History: Past Medical History:  Diagnosis Date  . Coronary artery disease   . Hyperlipidemia   . Hypertension   . Vascular abnormality     Medications:  Scheduled:  . ALPRAZolam  0.25 mg Oral QHS  . amLODipine  2.5 mg Oral Daily  . atorvastatin  20 mg Oral q1800  . cholecalciferol  1,000 Units Oral Daily  . diphenhydrAMINE  25 mg Oral QHS  . famotidine  20 mg Oral Daily  . sodium chloride flush  3 mL Intravenous Q12H  . tamsulosin  0.4 mg Oral Daily   Infusions:  .  ceFAZolin (ANCEF) IV    . dextrose 5% lactated ringers    . heparin 1,000 Units/hr (09/20/17 2128)    Assessment: Pharmacy consulted to dose Heparin for this 81 yo male who was sent from PCP with possible clot in right leg, pt c/o numbness and pain in the right LE since yesterday.   Goal of Therapy:  Heparin level 0.3-0.7 units/ml Monitor platelets by anticoagulation protocol: Yes   Plan:  11/14 2040 HL supratherapeutic at 0.75. Will decrease heparin drip to 1000units/hr and recheck HL in 8 hours.  11/15 0500 heparin level 0.55. Recheck in 8 hours to confirm.   Erich MontaneMcBane,Kabao Leite S, PharmD, BCPS Clinical  Pharmacist 09/21/2017 6:03 AM

## 2017-09-21 NOTE — Progress Notes (Signed)
1        [employer] [facility] [place] [facility]   PATIENT NAME: Bryan Welch    MR#:  [id number]  DATE OF BIRTH:  1936-08-20  SUBJECTIVE:  CHIEF COMPLAINT:   Chief Complaint  Patient presents with  . Numbness  No complaints -waiting for vascular intervention, wife at bedside REVIEW OF SYSTEMS:  Review of Systems  Constitutional: Negative for chills, fever and weight loss.  HENT: Negative for nosebleeds and sore throat.   Eyes: Negative for blurred vision.  Respiratory: Negative for cough, shortness of breath and wheezing.   Cardiovascular: Negative for chest pain, orthopnea, leg swelling and PND.  Gastrointestinal: Negative for abdominal pain, constipation, diarrhea, heartburn, nausea and vomiting.  Genitourinary: Negative for dysuria and urgency.  Musculoskeletal: Negative for back pain.  Skin: Negative for rash.  Neurological: Negative for dizziness, speech change, focal weakness and headaches.  Endo/Heme/Allergies: Does not bruise/bleed easily.  Psychiatric/Behavioral: Negative for depression.    DRUG ALLERGIES:   Allergies  Allergen Reactions  . Codeine Itching   VITALS:  Blood pressure (!) 158/57, pulse 72, temperature 98.4 F (36.9 C), temperature source Oral, resp. rate 18, height 5\' 5"  (1.651 m), weight 63.6 kg (140 lb 3.2 oz), SpO2 98 %. PHYSICAL EXAMINATION:  Physical Exam  Constitutional: He is oriented to person, place, and time and well-developed, well-nourished, and in no distress.  HENT:  Head: Normocephalic and atraumatic.  Eyes: Conjunctivae and EOM are normal. Pupils are equal, round, and reactive to light.  Neck: Normal range of motion. Neck supple. No tracheal deviation present. No thyromegaly present.  Cardiovascular: Normal rate, regular rhythm and normal heart sounds.  Pulmonary/Chest: Effort normal and breath sounds normal. No respiratory distress. He has no wheezes. He exhibits no tenderness.  Abdominal: Soft. Bowel sounds are  normal. He exhibits no distension. There is no tenderness.  Musculoskeletal: Normal range of motion.  Neurological: He is alert and oriented to person, place, and time. No cranial nerve deficit.  Skin: Skin is warm and dry. No rash noted.  Psychiatric: Mood and affect normal.   LABORATORY PANEL:  Male CBC Recent Labs  Lab 09/21/17 0510  WBC 6.7  HGB 15.4  HCT 44.6  PLT 182   ------------------------------------------------------------------------------------------------------------------ Chemistries  Recent Labs  Lab 09/21/17 0510  NA 138  K 3.3*  CL 105  CO2 24  GLUCOSE 100*  BUN 24*  CREATININE 1.28*  CALCIUM 8.7*   RADIOLOGY:  Ct Angio Aortobifemoral W And/or Wo Contrast  Result Date: 09/20/2017 CLINICAL DATA:  Right lower extremity pain and edema. History of PAD post arterial stenting. Evaluate for arterial thrombus. EXAM: CT ANGIOGRAPHY OF ABDOMINAL AORTA WITH ILIOFEMORAL RUNOFF TECHNIQUE: Multidetector CT imaging of the abdomen, pelvis and lower extremities was performed using the standard protocol during bolus administration of intravenous contrast. Multiplanar CT image reconstructions and MIPs were obtained to evaluate the vascular anatomy. CONTRAST:  90mL ISOVUE-370 IOPAMIDOL (ISOVUE-370) INJECTION 76% COMPARISON:  CT the chest, abdomen and pelvis - 03/13/2013; CT abdomen pelvis - 11/23/2019; 05/05/2010 FINDINGS: VASCULAR Aorta: There is a moderate to large amount of irregular mixed calcified and noncalcified atherosclerotic plaque throughout the abdominal aorta. There is a nonocclusive intimal web within the infrarenal abdominal aorta (coronal image 54, series 8), not resulting in hemodynamically significant stenosis. Re- demonstrated saccular infrarenal abdominal aortic aneurysm arising from the anterior aspect of the abdominal aorta measuring approximately 3.6 x 3.5 x 3.8 cm as measured in greatest oblique short axis axial (image 66, series  7), coronal (image 42, series  8) and sagittal (image 75, series 9) dimensions respectively, previously, 3.2 cm in maximal diameter when compared to the 03/2013 examination. No periaortic stranding. Celiac: There is mixed calcified and noncalcified atherosclerotic plaque involving the origin and main trunk of the celiac artery, not resulting in hemodynamically significant stenosis. The main trunk of the celiac artery is tortuous. There is mild ectasia of the distal aspect of the main trunk of the celiac artery measuring approximately 1.0 x 1.1 cm (coronal image 49, series 6, sagittal image 87, series 9). SMA: There is a moderate amount of E centric mixed calcified and noncalcified atherosclerotic plaque involving the origin and proximal aspect of the SMA, likely resulting at least 50% luminal narrowing (image 87, series 9). Renals: Solitary bilaterally ; the right renal artery is slightly atrophic in comparison the left, likely secondary to atrophy of the right kidney. There is a moderate amount of mixed calcified and noncalcified atherosclerotic plaque involving the origin and proximal aspects of the bilateral renal arteries, not definitely resulting in hemodynamically significant stenosis. IMA: Occluded at its origin with early reconstitution via collateral supply from the SMA. RIGHT Lower Extremity Inflow: There is a large amount of mixed calcified and noncalcified atherosclerotic plaque involving the right common iliac artery likely resulting in focal approximately 50% luminal narrowing (axial image 90, series 7). The right internal iliac artery is disease though patent of normal caliber. Vascular stent within the right external iliac artery appears widely patent without evidence of in stent stenosis. Outflow: There is a moderate amount of mixed calcified and noncalcified atherosclerotic plaque within the right common femoral artery which approaches approximately 50% luminal narrowing (image 120, series 7). The right deep femoral artery  appears widely patent. There is mixed calcified and noncalcified atherosclerotic plaque involving the origin of the right superficial femoral artery approach 50% luminal narrowing (image 138, series 7). There is scattered mixed calcified and noncalcified atherosclerotic plaque throughout the remainder of the right superficial femoral artery with potential focal hemodynamically significant stenosis at its distal aspect above the 80 adductor canal (image 194, series 7). Mixed calcified and noncalcified atherosclerotic plaque results in at least 50% luminal narrowing involving the right above knee popliteal artery (image 248, series 7). Age-indeterminate occlusion involving the distal aspect of the right popliteal artery (image 263, series 7) Runoff: The right below-knee popliteal artery extends to involve the tibioperoneal trunk. There is reconstitution of the proximal aspects of the tibial vasculature at the level the proximal calf. There is short-segment occlusion involving the proximal aspect of the right posterior tibial artery (image 54, series 7) with early reconstitution. The right posterior tibial artery serves as the predominant arterial supply to the right lower leg and foot. The right peroneal artery occludes shortly after its origin with atretic short-segment reconstitution and eventual occlusion at the level of the right lower leg. The right anterior tibial artery occludes at the level of the mid/lower leg. A patent right dorsalis pedis artery is not identified. LEFT Lower Extremity Inflow: Moderate large amount of mixed calcified and noncalcified atherosclerotic plaque involving the right common external iliac artery's, not definitely resulting in hemodynamically significant stenosis. The right internal iliac artery is heavily disease though patent of normal caliber. Outflow: Mixed calcified and noncalcified atherosclerotic plaque involving the left common femoral artery approaches 50% luminal narrowing  (image 128, series 7). The left deep femoral artery appears patent throughout its imaged course. There is a stent within the distal aspect of the left  superficial femoral artery. There is a minimal amount of in stent stenosis (image 190, series 7 come not definitely resulting in hemodynamically significant stenosis. Mixed calcified and noncalcified atherosclerotic plaque results in at least 50% luminal narrowing involving the left above knee popliteal artery (image 256, series 7 as well as the left below-knee popliteal artery (image 270, series 7). Runoff: Three-vessel runoff to the left lower leg however the left anterior tibial artery occludes above the level of the ankle mortise (image 367, series 7). Predominant arterial supply to the left foot is via the posterior tibial artery. A patent left dorsalis pedis artery is not identified. Veins: The IVC and pelvic venous system appears widely patent on this arterial phase examination. Review of the MIP images confirms the above findings. _________________________________________________________ NON-VASCULAR Lower chest: Limited visualization of the lower thorax demonstrates minimal dependent subpleural ground-glass atelectasis. No discrete focal airspace opacities. No pleural effusion. Normal heart size. Coronary artery calcifications. No pericardial effusion. Hepatobiliary: Normal hepatic contour. No discrete hyperenhancing hepatic lesions. Normal appearance of the gallbladder given degree distention. No radiopaque gallstones. No intra extrahepatic bili duct dilatation. No ascites. Pancreas: Normal appearance of the pancreas Spleen: Normal appearance of the spleen Adrenals/Urinary Tract: There is symmetric enhancement of the bilateral kidneys there is asymmetric atrophy involving the right kidney in comparison to the left, likely secondary to a right UPJ stenosis. No definite renal stones this postcontrast examination. No discrete renal lesions. There is a minimal  amount of grossly some symmetric bilateral perinephric stranding. Normal appearance the bilateral adrenal glands. Normal appearance of the urinary bladder given degree distention. Stomach/Bowel: Rather extensive colonic diverticulosis without evidence of superimposed acute diverticulitis. Moderate colonic stool burden without evidence of enteric obstruction. Normal appearance of the terminal ileum and appendix. No pneumoperitoneum, pneumatosis or portal venous gas. Lymphatic: No bulky retroperitoneal, mesenteric, pelvic or inguinal lymphadenopathy. Reproductive: Dystrophic calcifications and normal sized prostate gland. No free fluid in the pelvic cul-de-sac. Other: Regional soft tissues appear normal. Musculoskeletal: Moderate scoliotic curvature of the thoracolumbar spine with dominant caudal component convex the right. Moderate severe multilevel lumbar spine DDD with disc space height loss, endplate irregularity and sclerosis. IMPRESSION: VASCULAR 1. Moderate to large amount of atherosclerotic plaque within the abdominal aorta, not resulting in hemodynamically significant stenosis. Aortic Atherosclerosis (ICD10-I70.0). 2. Increase in size of saccular infrarenal abdominal aortic aneurysm, now measuring 3.8 cm in maximal diameter, previously, 3.2 cm (when compared to the 03/2013 examination). No periaortic stranding. Aortic aneurysm NOS (ICD10-I71.9). 3. Suspected hemodynamically significant stenosis involving the origin and proximal aspect the SMA. The IMA is occluded at its origin with early reconstitution via SMA collaterals. Correlation for symptoms of chronic mesenteric ischemia is recommended. Right lower extremity vasculature: 1. Age-indeterminate, though potentially acute or acute on chronic, occlusion of the right below-knee popliteal artery extending to involve the tibioperoneal trunk. 2. There is short-segment occlusion of the right posterior tibial artery with early reconstitution. The right posterior  tibial artery serves as the predominant arterial supply to the right lower leg and foot. A patent right-sided dorsalis pedis artery is not identified. 3. Suspected hemodynamically significant stenosis involving the right common iliac artery. 4. Right external iliac arterial stent appears widely patent without evidence of in-stent stenosis. 5. Irregular mixed calcified and noncalcified atherosclerotic plaque results in focal at least 50% luminal narrowing of the right common femoral, the proximal and distal aspects of the right superficial femoral and the right above knee popliteal arteries. Left lower extremity vasculature: *Minimal amount of in-stent stenosis  within the distal left superficial femoral arterial stent, not definitely resulting in a hemodynamically significant stenosis. *Atherosclerotic plaque results in approximately 50% luminal narrowing involving the left common femoral and the left above and below-knee popliteal arteries. *Three-vessel runoff to the left lower leg, however the left anterior tibial artery occludes above the level of the ankle mortise. The predominant arterial supply to the left foot is via the left posterior tibial artery. A patent left-sided dorsalis pedis artery is not identified. NON-VASCULAR 1. 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 2. Extensive colonic diverticulosis without evidence of diverticulitis. Critical Value/emergent results were called by telephone at the time of interpretation on 09/20/2017 at 2:45 pm to Dr. Willy Eddy , who verbally acknowledged these results. Electronically Signed   By: Simonne Come M.D.   On: 09/20/2017 15:16   ASSESSMENT AND PLAN:   1 acute right lower extremity limb ischemia - known history of peripheral vascular disease - Currently without leg pain. - CT angiogram noted above for acute on chronic  occlusion of the right popliteal artery, right posterior tibial artery occlusion proximally with reconstitution distally - vascular surgery planning for intervention on today,  - continue heparin drip, continue close medical monitoring  2 chronic benign essential hypertension -We will update home medication once pharmacy reviews them  3 chronic hyperlipidemia, unspecified Statin therapy  4 history of coronary artery disease - Nitroglycerin as needed chest pain, morphine as needed breakthrough pain     All the records are reviewed and case discussed with Care Management/Social Worker. Management plans discussed with the patient, family (wife at bedside) and they are in agreement.  CODE STATUS: Full Code  TOTAL TIME TAKING CARE OF THIS PATIENT: 35 minutes.   More than 50% of the time was spent in counseling/coordination of care: YES  POSSIBLE D/C IN 2-3 DAYS, DEPENDING ON CLINICAL CONDITION.  Vascular evaluation   Delfino Lovett M.D on 09/21/2017 at 7:44 AM  Between 7am to 6pm - Pager - [phone]  After 6pm go to [email or web address] - Social research officer, government  [facility]  Office  [phone]  CC: Primary care physician; Mick Sell, MD  Note: This dictation was prepared with Dragon dictation along with smaller phrase technology. Any transcriptional errors that result from this process are unintentional.

## 2017-09-21 NOTE — Progress Notes (Signed)
Talked to Dr. Sherryll BurgerShah about patient's transfer status to our unit, order for telemetry monitor. Also talked about patient's potassium at 3.3 order to replace with potassium 40 meq, p.o. Patient is also eating now, order to discontinue IV fluids. No other concern at the moment. RN will continue to monitor.

## 2017-09-22 ENCOUNTER — Encounter: Payer: Self-pay | Admitting: Vascular Surgery

## 2017-09-22 DIAGNOSIS — I70221 Atherosclerosis of native arteries of extremities with rest pain, right leg: Secondary | ICD-10-CM

## 2017-09-22 LAB — CBC
HCT: 47.2 % (ref 40.0–52.0)
Hemoglobin: 15.9 g/dL (ref 13.0–18.0)
MCH: 29.3 pg (ref 26.0–34.0)
MCHC: 33.7 g/dL (ref 32.0–36.0)
MCV: 86.9 fL (ref 80.0–100.0)
PLATELETS: 129 10*3/uL — AB (ref 150–440)
RBC: 5.42 MIL/uL (ref 4.40–5.90)
RDW: 13.9 % (ref 11.5–14.5)
WBC: 9.2 10*3/uL (ref 3.8–10.6)

## 2017-09-22 LAB — PROTIME-INR
INR: 1.03
PROTHROMBIN TIME: 13.4 s (ref 11.4–15.2)

## 2017-09-22 MED ORDER — APIXABAN 5 MG PO TABS
5.0000 mg | ORAL_TABLET | Freq: Two times a day (BID) | ORAL | Status: DC
  Administered 2017-09-22: 5 mg via ORAL
  Filled 2017-09-22: qty 1

## 2017-09-22 MED ORDER — APIXABAN 5 MG PO TABS: 5.0000 mg | ORAL_TABLET | Freq: Two times a day (BID) | ORAL | 0 refills | Status: DC

## 2017-09-22 NOTE — Care Management Note (Signed)
Case Management Note  Patient Details  Name: Bryan CarneJohn S Welch MRN: [id number] Date of Birth: 10/25/1936  Subjective/Objective:                 Spoke with vascular regarding consult for CM for discharging on Lovenox.  CM was informed patient will not be discharging on lovenox.  He will discharge on Eliquis. Patient verbally confirms he has pharmacy coverage with his medicare plan.  Vascular says there are no home health needs  Action/Plan: Provided Eliquis coupon  Expected Discharge Date:  09/25/17               Expected Discharge Plan:     In-House Referral:     Discharge planning Services     Post Acute Care Choice:    Choice offered to:     DME Arranged:    DME Agency:     HH Arranged:    HH Agency:     Status of Service:     If discussed at [employer] Meetings, dates discussed:    Additional Comments:  Eber HongGreene, Danine Hor R, RN 09/22/2017, 9:48 AM

## 2017-09-22 NOTE — Progress Notes (Signed)
Bryan CarneJohn S Welch to be D/C'd Home per MD order.  Discussed prescriptions and follow up appointments with the patient. Prescriptions were e-prescribedt, medication list explained in detail. Pt verbalized understanding. Educated on signs of infection and how to care for incision. Pt verbalized understanding. Wife at bedside.  Allergies as of 09/22/2017      Reactions   Codeine Itching      Medication List    TAKE these medications   ALPRAZolam 0.25 MG tablet Commonly known as:  XANAX Take 1 tablet at bedtime by mouth.   amLODipine 2.5 MG tablet Commonly known as:  NORVASC Take 2.5 mg daily by mouth.   apixaban 5 MG Tabs tablet Commonly known as:  ELIQUIS Take 1 tablet (5 mg total) 2 (two) times daily by mouth.   aspirin EC 81 MG tablet Take 81 mg daily by mouth.   diphenhydrAMINE 25 mg capsule Commonly known as:  BENADRYL Take 25 mg at bedtime by mouth.   hydrochlorothiazide 25 MG tablet Commonly known as:  HYDRODIURIL Take 25 mg daily by mouth.   pravastatin 20 MG tablet Commonly known as:  PRAVACHOL Take 20 mg daily by mouth.   ranitidine 150 MG tablet Commonly known as:  ZANTAC Take 150 mg at bedtime by mouth.   tamsulosin 0.4 MG Caps capsule Commonly known as:  FLOMAX Take 0.4 mg daily by mouth.   Vitamin D3 1000 units Caps Take 1 capsule daily by mouth.       Vitals:   09/22/17 0417 09/22/17 0735  BP: 136/64 131/68  Pulse: 89 87  Resp: 17 16  Temp: 100.1 F (37.8 C) 100.1 F (37.8 C)  SpO2: 97% 96%    Skin clean, dry and intact without evidence of skin break down, no evidence of skin tears noted. IV catheter discontinued intact. Site without signs and symptoms of complications. Dressing and pressure applied. Tele box removed and returned. Pt denies pain at this time. No complaints noted.  An After Visit Summary was printed and given to the patient. Patient escorted via WC, and D/C home via private auto.  Rigoberto NoelErica Y Brielynn Sekula

## 2017-09-22 NOTE — Progress Notes (Signed)
Ambulated patient and tolerated well, patient did complaints of soreness but discomfort is tolerated. No other concern at the moment. RN will continue to monitor.

## 2017-09-22 NOTE — Progress Notes (Signed)
[facility]  Daily Progress Note   Subjective  - 1 Day Post-Op  No events overnight Foot warm, sensation returning  Objective Vitals:   09/21/17 1716 09/21/17 1923 09/22/17 0417 09/22/17 0735  BP: (!) 141/51 (!) 158/52 136/64 131/68  Pulse: 85 83 89 87  Resp:  17 17 16   Temp: 98.2 F (36.8 C) 98 F (36.7 C) 100.1 F (37.8 C) 100.1 F (37.8 C)  TempSrc: Oral  Oral Oral  SpO2: 99% 99% 97% 96%  Weight:      Height:        Intake/Output Summary (Last 24 hours) at 09/22/2017 0946 Last data filed at 09/21/2017 2300 Gross per 24 hour  Intake -  Output 900 ml  Net -900 ml    PULM  CTAB CV  RRR VASC  1+ right PT pulse, foot warm.  Access site dressing removed, no hematoma  Laboratory CBC    Component Value Date/Time   WBC 9.2 09/22/2017 0508   HGB 15.9 09/22/2017 0508   HGB 14.5 03/22/2013 0237   HCT 47.2 09/22/2017 0508   HCT 42.6 03/22/2013 0237   PLT 129 (L) 09/22/2017 0508   PLT 241 03/22/2013 0237    BMET    Component Value Date/Time   NA 138 09/21/2017 0510   NA 141 03/22/2013 0237   K 3.3 (L) 09/21/2017 0510   K 4.3 03/22/2013 0237   CL 105 09/21/2017 0510   CL 110 (H) 03/22/2013 0237   CO2 24 09/21/2017 0510   CO2 24 03/22/2013 0237   GLUCOSE 100 (H) 09/21/2017 0510   GLUCOSE 101 (H) 03/22/2013 0237   BUN 24 (H) 09/21/2017 0510   BUN 19 (H) 03/22/2013 0237   CREATININE 1.28 (H) 09/21/2017 0510   CREATININE 1.53 (H) 03/22/2013 0237   CALCIUM 8.7 (L) 09/21/2017 0510   CALCIUM 8.1 (L) 03/22/2013 0237   GFRNONAA 52 (L) 09/21/2017 0510   GFRNONAA 44 (L) 03/22/2013 0237   GFRAA >60 09/21/2017 0510   GFRAA 51 (L) 03/22/2013 0237    Assessment/Planning: POD #1 s/p RLE revascularization   Stop Aggrastat  Start Eliquis and keep on 81 mg ASA  Ambulate  OK to discharge today from my point of view on eliquis and ASA.  See me in office in 2-3 weeks    Festus BarrenJason Dew  09/22/2017, 9:46 AM

## 2017-09-22 NOTE — Consult Note (Addendum)
[facility] Vascular Consult Note  MRN : [id number]  Bryan Welch is a 81 y.o. (01/17/1936) male who presents with chief complaint of  Chief Complaint  Patient presents with  . Numbness  .  History of Present Illness: I am asked to evaluate the patient by Dr. Katheren ShamsSalary for right leg pain and acute ischemia.  I was contacted by the emergency room and told that the patient was a healthy gentleman that had the abrupt onset of right leg pain which has steadily worsened over the past couple days.  This prompted him to present to the emergency room.  I was informed after questioning that there was no history of atrial fibrillation and that he had palpable pulses in the left leg.  Given the palpable left leg pulses embolization still seem to be the most likely consideration and I asked for a CT angiogram.  While I was in the operating room I was again contacted by the emergency room to inform you the results of the CT scan were patient had clot in his right popliteal artery.  This was the only finding reported to me.  At that point I felt that the situation was most consistent with embolization and occult atrial fibrillation and asked that the medical services admit.  After I had completed my operation and the patient had been admitted I had the opportunity to sit down and evaluate the CT scan myself which demonstrated multiple previous interventions as well as an abdominal aortic aneurysm as well as significant disease at multi levels in the right lower extremity including the above-noted occlusion at the popliteal level.  Furthermore, upon opening the patient's epic chart it was readily apparent that he had been seen by Dr. Wyn Quakerew on multiple occasions and was followed in our office routinely.  None of this was appropriately relayed to me earlier.  I proceeded to the emergency room where I examined the patient at the time of my examination the patient said he was having 0 pain.   He had sensation intact and he could wiggle his toes.  Current Facility-Administered Medications  Medication Dose Route Frequency Provider Last Rate Last Dose  . acetaminophen (TYLENOL) tablet 650 mg  650 mg Oral Q6H PRN Salary, Montell D, MD   650 mg at 09/21/17 2025   Or  . acetaminophen (TYLENOL) suppository 650 mg  650 mg Rectal Q6H PRN Salary, Montell D, MD      . ALPRAZolam Prudy Feeler(XANAX) tablet 0.25 mg  0.25 mg Oral QHS Salary, Montell D, MD   0.25 mg at 09/21/17 2018  . amLODipine (NORVASC) tablet 2.5 mg  2.5 mg Oral Daily Salary, Montell D, MD      . atorvastatin (LIPITOR) tablet 20 mg  20 mg Oral q1800 Salary, Montell D, MD   20 mg at 09/21/17 2018  . cholecalciferol (VITAMIN D) tablet 1,000 Units  1,000 Units Oral Daily Salary, Montell D, MD      . diphenhydrAMINE (BENADRYL) capsule 25 mg  25 mg Oral QHS Salary, Montell D, MD   25 mg at 09/21/17 2018  . famotidine (PEPCID) tablet 20 mg  20 mg Oral Daily Salary, Montell D, MD   20 mg at 09/20/17 1822  . fentaNYL (SUBLIMAZE) injection 50 mcg  50 mcg Intravenous Q1H PRN Willy Eddyobinson, Patrick, MD   50 mcg at 09/20/17 1137  . HYDROcodone-acetaminophen (NORCO/VICODIN) 5-325 MG per tablet 1-2 tablet  1-2 tablet Oral Q4H PRN Salary, Evelena AsaMontell D, MD      .  morphine 2 MG/ML injection 2 mg  2 mg Intravenous Q2H PRN Salary, Montell D, MD   2 mg at 09/21/17 0840  . ondansetron (ZOFRAN) tablet 4 mg  4 mg Oral Q6H PRN Salary, Montell D, MD       Or  . ondansetron (ZOFRAN) injection 4 mg  4 mg Intravenous Q6H PRN Salary, Montell D, MD      . polyethylene glycol (MIRALAX / GLYCOLAX) packet 17 g  17 g Oral Daily PRN Salary, Montell D, MD      . sodium chloride flush (NS) 0.9 % injection 3 mL  3 mL Intravenous Q12H Salary, Montell D, MD   3 mL at 09/21/17 2018  . tamsulosin (FLOMAX) capsule 0.4 mg  0.4 mg Oral Daily Salary, Montell D, MD   0.4 mg at 09/20/17 1823  . tirofiban (AGGRASTAT) infusion 50 mcg/mL 100 mL  0.075 mcg/kg/min Intravenous Continuous Dew, Marlow Baars, MD        Past Medical History:  Diagnosis Date  . Coronary artery disease   . Hyperlipidemia   . Hypertension   . Vascular abnormality     Past Surgical History:  Procedure Laterality Date  . BACK SURGERY    . CAROTID STENT Right   . LEG SURGERY Bilateral    stent placement  . PERIPHERAL VASCULAR BALLOON ANGIOPLASTY Right 09/21/2017   Procedure: PERIPHERAL VASCULAR BALLOON ANGIOPLASTY;  Surgeon: Annice Needy, MD;  Location: [facility] INVASIVE CV LAB;  Service: Cardiovascular;  Laterality: Right;    Social History Social History   Tobacco Use  . Smoking status: Former Games developer  . Smokeless tobacco: Never Used  Substance Use Topics  . Alcohol use: Yes    Comment: occasionally  . Drug use: No    Family History Family History  Problem Relation Age of Onset  . Cancer Mother   . Cancer Father   No family history of bleeding/clotting disorders, porphyria or autoimmune disease    Allergies  Allergen Reactions  . Codeine Itching     REVIEW OF SYSTEMS (Negative unless checked)  Constitutional: [] Weight loss  [] Fever  [] Chills Cardiac: [] Chest pain   [] Chest pressure   [] Palpitations   [] Shortness of breath when laying flat   [] Shortness of breath at rest   [] Shortness of breath with exertion. Vascular:  [x] Pain in legs with walking   [x] Pain in legs at rest   [] Pain in legs when laying flat   [x] Claudication   [] Pain in feet when walking  [] Pain in feet at rest  [] Pain in feet when laying flat   [] History of DVT   [] Phlebitis   [] Swelling in legs   [] Varicose veins   [] Non-healing ulcers Pulmonary:   [] Uses home oxygen   [] Productive cough   [] Hemoptysis   [] Wheeze  [] COPD   [] Asthma Neurologic:  [] Dizziness  [] Blackouts   [] Seizures   [] History of stroke   [] History of TIA  [] Aphasia   [] Temporary blindness   [] Dysphagia   [] Weakness or numbness in arms   [] Weakness or numbness in legs Musculoskeletal:  [] Arthritis   [] Joint swelling   [] Joint pain   [] Low back  pain Hematologic:  [] Easy bruising  [] Easy bleeding   [] Hypercoagulable state   [] Anemic  [] Hepatitis Gastrointestinal:  [] Blood in stool   [] Vomiting blood  [] Gastroesophageal reflux/heartburn   [] Difficulty swallowing. Genitourinary:  [] Chronic kidney disease   [] Difficult urination  [] Frequent urination  [] Burning with urination   [] Blood in urine Skin:  [] Rashes   [] Ulcers   []   Wounds Psychological:  [] History of anxiety   []  History of major depression.  Physical Examination  Vitals:   09/21/17 1716 09/21/17 1923 09/22/17 0417 09/22/17 0735  BP: (!) 141/51 (!) 158/52 136/64 131/68  Pulse: 85 83 89 87  Resp:  17 17 16   Temp: 98.2 F (36.8 C) 98 F (36.7 C) 100.1 F (37.8 C) 100.1 F (37.8 C)  TempSrc: Oral  Oral Oral  SpO2: 99% 99% 97% 96%  Weight:      Height:       Body mass index is 23.3 kg/m. Gen:  WD/WN, NAD Head: [place]/AT, No temporalis wasting. Prominent temp pulse not noted. Ear/Nose/Throat: Hearing grossly intact, nares w/o erythema or drainage, oropharynx w/o Erythema/Exudate Eyes: Sclera non-icteric, conjunctiva clear Neck: Trachea midline.  No JVD.  Pulmonary:  Good air movement, respirations not labored, equal bilaterally.  Cardiac: RRR, normal S1, S2. Vascular: Right foot is cool to the touch motor sensory is present. Vessel Right Left  Radial Palpable Palpable  Brachial Palpable Palpable  Popliteal  not palpable Palpable  PT  not palpable Palpable  DP  not palpable  not palpable   Gastrointestinal: soft, non-tender/non-distended. No guarding/reflex.  Musculoskeletal: M/S 5/5 throughout.  Extremities without ischemic changes.  No deformity or atrophy. No edema. Neurologic: Sensation grossly intact in extremities.  Symmetrical.  Speech is fluent. Motor exam as listed above. Psychiatric: Judgment intact, Mood & affect appropriate for pt's clinical situation. Dermatologic: No rashes or ulcers noted.  No cellulitis or open wounds. Lymph : No Cervical, Axillary,  or Inguinal lymphadenopathy.      CBC Lab Results  Component Value Date   WBC 9.2 09/22/2017   HGB 15.9 09/22/2017   HCT 47.2 09/22/2017   MCV 86.9 09/22/2017   PLT 129 (L) 09/22/2017    BMET    Component Value Date/Time   NA 138 09/21/2017 0510   NA 141 03/22/2013 0237   K 3.3 (L) 09/21/2017 0510   K 4.3 03/22/2013 0237   CL 105 09/21/2017 0510   CL 110 (H) 03/22/2013 0237   CO2 24 09/21/2017 0510   CO2 24 03/22/2013 0237   GLUCOSE 100 (H) 09/21/2017 0510   GLUCOSE 101 (H) 03/22/2013 0237   BUN 24 (H) 09/21/2017 0510   BUN 19 (H) 03/22/2013 0237   CREATININE 1.28 (H) 09/21/2017 0510   CREATININE 1.53 (H) 03/22/2013 0237   CALCIUM 8.7 (L) 09/21/2017 0510   CALCIUM 8.1 (L) 03/22/2013 0237   GFRNONAA 52 (L) 09/21/2017 0510   GFRNONAA 44 (L) 03/22/2013 0237   GFRAA >60 09/21/2017 0510   GFRAA 51 (L) 03/22/2013 0237   Estimated Creatinine Clearance: 41.4 mL/min (A) (by C-G formula based on SCr of 1.28 mg/dL (H)).  COAG Lab Results  Component Value Date   INR 1.03 09/22/2017   INR 1.00 09/21/2017   INR 0.93 09/20/2017    Radiology Ct Angio Aortobifemoral W And/or Wo Contrast  Result Date: 09/20/2017 CLINICAL DATA:  Right lower extremity pain and edema. History of PAD post arterial stenting. Evaluate for arterial thrombus. EXAM: CT ANGIOGRAPHY OF ABDOMINAL AORTA WITH ILIOFEMORAL RUNOFF TECHNIQUE: Multidetector CT imaging of the abdomen, pelvis and lower extremities was performed using the standard protocol during bolus administration of intravenous contrast. Multiplanar CT image reconstructions and MIPs were obtained to evaluate the vascular anatomy. CONTRAST:  90mL ISOVUE-370 IOPAMIDOL (ISOVUE-370) INJECTION 76% COMPARISON:  CT the chest, abdomen and pelvis - 03/13/2013; CT abdomen pelvis - 11/23/2019; 05/05/2010 FINDINGS: VASCULAR Aorta: There is a moderate  to large amount of irregular mixed calcified and noncalcified atherosclerotic plaque throughout the abdominal  aorta. There is a nonocclusive intimal web within the infrarenal abdominal aorta (coronal image 54, series 8), not resulting in hemodynamically significant stenosis. Re- demonstrated saccular infrarenal abdominal aortic aneurysm arising from the anterior aspect of the abdominal aorta measuring approximately 3.6 x 3.5 x 3.8 cm as measured in greatest oblique short axis axial (image 66, series 7), coronal (image 42, series 8) and sagittal (image 75, series 9) dimensions respectively, previously, 3.2 cm in maximal diameter when compared to the 03/2013 examination. No periaortic stranding. Celiac: There is mixed calcified and noncalcified atherosclerotic plaque involving the origin and main trunk of the celiac artery, not resulting in hemodynamically significant stenosis. The main trunk of the celiac artery is tortuous. There is mild ectasia of the distal aspect of the main trunk of the celiac artery measuring approximately 1.0 x 1.1 cm (coronal image 49, series 6, sagittal image 87, series 9). SMA: There is a moderate amount of E centric mixed calcified and noncalcified atherosclerotic plaque involving the origin and proximal aspect of the SMA, likely resulting at least 50% luminal narrowing (image 87, series 9). Renals: Solitary bilaterally ; the right renal artery is slightly atrophic in comparison the left, likely secondary to atrophy of the right kidney. There is a moderate amount of mixed calcified and noncalcified atherosclerotic plaque involving the origin and proximal aspects of the bilateral renal arteries, not definitely resulting in hemodynamically significant stenosis. IMA: Occluded at its origin with early reconstitution via collateral supply from the SMA. RIGHT Lower Extremity Inflow: There is a large amount of mixed calcified and noncalcified atherosclerotic plaque involving the right common iliac artery likely resulting in focal approximately 50% luminal narrowing (axial image 90, series 7). The right  internal iliac artery is disease though patent of normal caliber. Vascular stent within the right external iliac artery appears widely patent without evidence of in stent stenosis. Outflow: There is a moderate amount of mixed calcified and noncalcified atherosclerotic plaque within the right common femoral artery which approaches approximately 50% luminal narrowing (image 120, series 7). The right deep femoral artery appears widely patent. There is mixed calcified and noncalcified atherosclerotic plaque involving the origin of the right superficial femoral artery approach 50% luminal narrowing (image 138, series 7). There is scattered mixed calcified and noncalcified atherosclerotic plaque throughout the remainder of the right superficial femoral artery with potential focal hemodynamically significant stenosis at its distal aspect above the 80 adductor canal (image 194, series 7). Mixed calcified and noncalcified atherosclerotic plaque results in at least 50% luminal narrowing involving the right above knee popliteal artery (image 248, series 7). Age-indeterminate occlusion involving the distal aspect of the right popliteal artery (image 263, series 7) Runoff: The right below-knee popliteal artery extends to involve the tibioperoneal trunk. There is reconstitution of the proximal aspects of the tibial vasculature at the level the proximal calf. There is short-segment occlusion involving the proximal aspect of the right posterior tibial artery (image 54, series 7) with early reconstitution. The right posterior tibial artery serves as the predominant arterial supply to the right lower leg and foot. The right peroneal artery occludes shortly after its origin with atretic short-segment reconstitution and eventual occlusion at the level of the right lower leg. The right anterior tibial artery occludes at the level of the mid/lower leg. A patent right dorsalis pedis artery is not identified. LEFT Lower Extremity Inflow:  Moderate large amount of mixed calcified and noncalcified  atherosclerotic plaque involving the right common external iliac artery's, not definitely resulting in hemodynamically significant stenosis. The right internal iliac artery is heavily disease though patent of normal caliber. Outflow: Mixed calcified and noncalcified atherosclerotic plaque involving the left common femoral artery approaches 50% luminal narrowing (image 128, series 7). The left deep femoral artery appears patent throughout its imaged course. There is a stent within the distal aspect of the left superficial femoral artery. There is a minimal amount of in stent stenosis (image 190, series 7 come not definitely resulting in hemodynamically significant stenosis. Mixed calcified and noncalcified atherosclerotic plaque results in at least 50% luminal narrowing involving the left above knee popliteal artery (image 256, series 7 as well as the left below-knee popliteal artery (image 270, series 7). Runoff: Three-vessel runoff to the left lower leg however the left anterior tibial artery occludes above the level of the ankle mortise (image 367, series 7). Predominant arterial supply to the left foot is via the posterior tibial artery. A patent left dorsalis pedis artery is not identified. Veins: The IVC and pelvic venous system appears widely patent on this arterial phase examination. Review of the MIP images confirms the above findings. _________________________________________________________ NON-VASCULAR Lower chest: Limited visualization of the lower thorax demonstrates minimal dependent subpleural ground-glass atelectasis. No discrete focal airspace opacities. No pleural effusion. Normal heart size. Coronary artery calcifications. No pericardial effusion. Hepatobiliary: Normal hepatic contour. No discrete hyperenhancing hepatic lesions. Normal appearance of the gallbladder given degree distention. No radiopaque gallstones. No intra extrahepatic  bili duct dilatation. No ascites. Pancreas: Normal appearance of the pancreas Spleen: Normal appearance of the spleen Adrenals/Urinary Tract: There is symmetric enhancement of the bilateral kidneys there is asymmetric atrophy involving the right kidney in comparison to the left, likely secondary to a right UPJ stenosis. No definite renal stones this postcontrast examination. No discrete renal lesions. There is a minimal amount of grossly some symmetric bilateral perinephric stranding. Normal appearance the bilateral adrenal glands. Normal appearance of the urinary bladder given degree distention. Stomach/Bowel: Rather extensive colonic diverticulosis without evidence of superimposed acute diverticulitis. Moderate colonic stool burden without evidence of enteric obstruction. Normal appearance of the terminal ileum and appendix. No pneumoperitoneum, pneumatosis or portal venous gas. Lymphatic: No bulky retroperitoneal, mesenteric, pelvic or inguinal lymphadenopathy. Reproductive: Dystrophic calcifications and normal sized prostate gland. No free fluid in the pelvic cul-de-sac. Other: Regional soft tissues appear normal. Musculoskeletal: Moderate scoliotic curvature of the thoracolumbar spine with dominant caudal component convex the right. Moderate severe multilevel lumbar spine DDD with disc space height loss, endplate irregularity and sclerosis. IMPRESSION: VASCULAR 1. Moderate to large amount of atherosclerotic plaque within the abdominal aorta, not resulting in hemodynamically significant stenosis. Aortic Atherosclerosis (ICD10-I70.0). 2. Increase in size of saccular infrarenal abdominal aortic aneurysm, now measuring 3.8 cm in maximal diameter, previously, 3.2 cm (when compared to the 03/2013 examination). No periaortic stranding. Aortic aneurysm NOS (ICD10-I71.9). 3. Suspected hemodynamically significant stenosis involving the origin and proximal aspect the SMA. The IMA is occluded at its origin with early  reconstitution via SMA collaterals. Correlation for symptoms of chronic mesenteric ischemia is recommended. Right lower extremity vasculature: 1. Age-indeterminate, though potentially acute or acute on chronic, occlusion of the right below-knee popliteal artery extending to involve the tibioperoneal trunk. 2. There is short-segment occlusion of the right posterior tibial artery with early reconstitution. The right posterior tibial artery serves as the predominant arterial supply to the right lower leg and foot. A patent right-sided dorsalis pedis artery is not identified.  3. Suspected hemodynamically significant stenosis involving the right common iliac artery. 4. Right external iliac arterial stent appears widely patent without evidence of in-stent stenosis. 5. Irregular mixed calcified and noncalcified atherosclerotic plaque results in focal at least 50% luminal narrowing of the right common femoral, the proximal and distal aspects of the right superficial femoral and the right above knee popliteal arteries. Left lower extremity vasculature: *Minimal amount of in-stent stenosis within the distal left superficial femoral arterial stent, not definitely resulting in a hemodynamically significant stenosis. *Atherosclerotic plaque results in approximately 50% luminal narrowing involving the left common femoral and the left above and below-knee popliteal arteries. *Three-vessel runoff to the left lower leg, however the left anterior tibial artery occludes above the level of the ankle mortise. The predominant arterial supply to the left foot is via the left posterior tibial artery. A patent left-sided dorsalis pedis artery is not identified. NON-VASCULAR 1. 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 2. Extensive colonic diverticulosis without evidence of diverticulitis. Critical  Value/emergent results were called by telephone at the time of interpretation on 09/20/2017 at 2:45 pm to Dr. Willy Eddy , who verbally acknowledged these results. Electronically Signed   By: Simonne Come M.D.   On: 09/20/2017 15:16      Assessment/Plan 1.  Ischemic right lower extremity: The patient has multilevel atherosclerotic occlusive disease with a long history of peripheral vascular disease and is status post multiple interventions.  Given his lack of pain at this time and his motor function I do not believe emergent revascularization is necessary.  However, I believe he should undergo revascularization in the morning rather than waiting until Friday.  Dr. Wyn Quaker has availability and is cared for him in the past and has agreed to move forward with fat revascularization Thursday morning.  Risks and benefits were reviewed with the patient he is very familiar with these techniques he is agreed to proceed. 2.  Hyperlipidemia:  Continue statin as ordered and reviewed, no changes at this time 3.  Hypertension:  Patient will continue medical management; nephrology is following no changes in oral medications. 4.  Coronary artery disease:  EKG will be monitored. Nitrates will be used if needed. [name] oral cardiac medications will be continued.   Levora Dredge, MD  09/22/2017 7:50 AM    This note was created with Dragon medical transcription system.  Any error is purely unintentional

## 2017-09-22 NOTE — Discharge Instructions (Signed)
Apixaban oral tablets °What is this medicine? °APIXABAN (a PIX a ban) is an anticoagulant (blood thinner). It is used to lower the chance of stroke in people with a medical condition called atrial fibrillation. It is also used to treat or prevent blood clots in the lungs or in the veins. °This medicine may be used for other purposes; ask your health care provider or pharmacist if you have questions. °COMMON BRAND NAME(S): Eliquis °What should I tell my health care provider before I take this medicine? °They need to know if you have any of these conditions: °-bleeding disorders °-bleeding in the brain °-blood in your stools (black or tarry stools) or if you have blood in your vomit °-history of stomach bleeding °-kidney disease °-liver disease °-mechanical heart valve °-an unusual or allergic reaction to apixaban, other medicines, foods, dyes, or preservatives °-pregnant or trying to get pregnant °-breast-feeding °How should I use this medicine? °Take this medicine by mouth with a glass of water. Follow the directions on the prescription label. You can take it with or without food. If it upsets your stomach, take it with food. Take your medicine at regular intervals. Do not take it more often than directed. Do not stop taking except on your doctor's advice. Stopping this medicine may increase your risk of a blot clot. Be sure to refill your prescription before you run out of medicine. °Talk to your pediatrician regarding the use of this medicine in children. Special care may be needed. °Overdosage: If you think you have taken too much of this medicine contact a poison control center or emergency room at once. °NOTE: This medicine is only for you. Do not share this medicine with others. °What if I miss a dose? °If you miss a dose, take it as soon as you can. If it is almost time for your next dose, take only that dose. Do not take double or extra doses. °What may interact with this medicine? °This medicine may  interact with the following: °-aspirin and aspirin-like medicines °-certain medicines for fungal infections like ketoconazole and itraconazole °-certain medicines for seizures like carbamazepine and phenytoin °-certain medicines that treat or prevent blood clots like warfarin, enoxaparin, and dalteparin °-clarithromycin °-NSAIDs, medicines for pain and inflammation, like ibuprofen or naproxen °-rifampin °-ritonavir °-St. Dariusz's wort °This list may not describe all possible interactions. Give your health care provider a list of all the medicines, herbs, non-prescription drugs, or dietary supplements you use. Also tell them if you smoke, drink alcohol, or use illegal drugs. Some items may interact with your medicine. °What should I watch for while using this medicine? °Visit your doctor or health care professional for regular checks on your progress. °Notify your doctor or health care professional and seek emergency treatment if you develop breathing problems; changes in vision; chest pain; severe, sudden headache; pain, swelling, warmth in the leg; trouble speaking; sudden numbness or weakness of the face, arm or leg. These can be signs that your condition has gotten worse. °If you are going to have surgery or other procedure, tell your doctor that you are taking this medicine. °What side effects may I notice from receiving this medicine? °Side effects that you should report to your doctor or health care professional as soon as possible: °-allergic reactions like skin rash, itching or hives, swelling of the face, lips, or tongue °-signs and symptoms of bleeding such as bloody or black, tarry stools; red or dark-brown urine; spitting up blood or brown material that looks like coffee   grounds; red spots on the skin; unusual bruising or bleeding from the eye, gums, or nose This list may not describe all possible side effects. Call your doctor for medical advice about side effects. You may report side effects to FDA at  1-800-FDA-1088. Where should I keep my medicine? Keep out of the reach of children. Store at room temperature between 20 and 25 degrees C (68 and 77 degrees F). Throw away any unused medicine after the expiration date. NOTE: This sheet is a summary. It may not cover all possible information. If you have questions about this medicine, talk to your doctor, pharmacist, or health care provider.  2018 Elsevier/Gold Standard (2016-05-16 11:54:23)   Peripheral Vascular Disease Peripheral vascular disease (PVD) is a disease of the blood vessels that are not part of your heart and brain. A simple term for PVD is poor circulation. In most cases, PVD narrows the blood vessels that carry blood from your heart to the rest of your body. This can result in a decreased supply of blood to your arms, legs, and internal organs, like your stomach or kidneys. However, it most often affects a persons lower legs and feet. There are two types of PVD.  Organic PVD. This is the more common type. It is caused by damage to the structure of blood vessels.  Functional PVD. This is caused by conditions that make blood vessels contract and tighten (spasm).  Without treatment, PVD tends to get worse over time. PVD can also lead to acute ischemic limb. This is when an arm or limb suddenly has trouble getting enough blood. This is a medical emergency. Follow these instructions at home:  Take medicines only as told by your doctor.  Do not use any tobacco products, including cigarettes, chewing tobacco, or electronic cigarettes. If you need help quitting, ask your doctor.  Lose weight if you are overweight, and maintain a healthy weight as told by your doctor.  Eat a diet that is low in fat and cholesterol. If you need help, ask your doctor.  Exercise regularly. Ask your doctor for some good activities for you.  Take good care of your feet. ? Wear comfortable shoes that fit well. ? Check your feet often for any cuts or  sores. Contact a doctor if:  You have cramps in your legs while walking.  You have leg pain when you are at rest.  You have coldness in a leg or foot.  Your skin changes.  You are unable to get or have an erection (erectile dysfunction).  You have cuts or sores on your feet that are not healing. Get help right away if:  Your arm or leg turns cold and blue.  Your arms or legs become red, warm, swollen, painful, or numb.  You have chest pain or trouble breathing.  You suddenly have weakness in your face, arm, or leg.  You become very confused or you cannot speak.  You suddenly have a very bad headache.  You suddenly cannot see. This information is not intended to replace advice given to you by your health care provider. Make sure you discuss any questions you have with your health care provider. Document Released: 01/18/2010 Document Revised: 03/31/2016 Document Reviewed: 04/03/2014 Elsevier Interactive Patient Education  2017 [employer]

## 2017-09-22 NOTE — Care Management Important Message (Signed)
Important Message  Patient Details  Name: Bryan CarneJohn S Welch MRN: [id number] Date of Birth: 02/12/1936   Medicare Important Message Given:  N/A - LOS <3 / Initial given by admissions    Eber HongGreene, Remington Skalsky R, RN 09/22/2017, 10:01 AM

## 2017-09-24 NOTE — Discharge Summary (Signed)
[facility]   PATIENT NAME: Bryan Welch    MR#:  [id number]  DATE OF BIRTH:  [birth year]  DATE OF ADMISSION:  09/20/2017   ADMITTING PHYSICIAN: Bertrum Sol, MD  DATE OF DISCHARGE: 09/22/2017  2:58 PM  PRIMARY CARE PHYSICIAN: Mick Sell, MD   ADMISSION DIAGNOSIS:  Critical lower limb ischemia [I99.8] Rest pain of left upper extremity due to atherosclerosis (HCC) [I70.228] DISCHARGE DIAGNOSIS:  Active Problems:   Limb ischemia  SECONDARY DIAGNOSIS:   Past Medical History:  Diagnosis Date  . Coronary artery disease   . Hyperlipidemia   . Hypertension   . Vascular abnormality    HOSPITAL COURSE:  Bryan Welch  is a 81 y.o. 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  1PAD with rest pain, acute on chronic ischemia of RLE - Currently without leg pain. - CT angiogram noted above for acute on chronic occlusion of the right popliteal artery, right posterior tibial artery occlusion proximally with reconstitution distally - s/p Ultrasound guidance for vascular access left femoral artery, Aortogram and selective right lower extremity angiogram - Catheter directed thrombolytic therapy with 8 mg of TPA delivered in the right popliteal artery and posterior tibial artery - Mechanical thrombectomy with the penumbra cat 6 device to the right popliteal artery and posterior tibial artery - Percutaneous transluminal angioplasty of the  posterior tibial artery with a 3 mm diameter by 30 cm length angioplasty balloon as well as a 4 mm diameter by 6 cm length Lutonix drug-coated angioplasty balloon proximally - being D/C on eliquis per vascular surgery         2chronic benign essential hypertension  3chronic hyperlipidemia, unspecified Statin therapy  4history of coronary artery disease - continue home regimen DISCHARGE CONDITIONS:  stable CONSULTS OBTAINED:   DRUG ALLERGIES:   Allergies  Allergen Reactions  . Codeine Itching   DISCHARGE MEDICATIONS:   Allergies as of 09/22/2017      Reactions   Codeine Itching      Medication List    TAKE these medications   ALPRAZolam 0.25 MG tablet Commonly known as:  XANAX Take 1 tablet at bedtime by mouth.   amLODipine 2.5 MG tablet Commonly known as:  NORVASC Take 2.5 mg daily by mouth.   apixaban 5 MG Tabs tablet Commonly known as:  ELIQUIS Take 1 tablet (5 mg total) 2 (two) times daily by mouth.   aspirin EC 81 MG tablet Take 81 mg daily by mouth.   diphenhydrAMINE 25 mg capsule Commonly known as:  BENADRYL Take 25 mg at bedtime by mouth.   hydrochlorothiazide 25 MG tablet Commonly known as:  HYDRODIURIL Take 25 mg daily by mouth.   pravastatin 20 MG tablet Commonly known as:  PRAVACHOL Take 20 mg daily by mouth.   ranitidine 150 MG tablet Commonly known as:  ZANTAC Take 150 mg at bedtime by mouth.   tamsulosin 0.4 MG Caps capsule Commonly known as:  FLOMAX Take 0.4 mg daily by mouth.   Vitamin D3 1000 units Caps Take 1 capsule daily  by mouth.        DISCHARGE INSTRUCTIONS:   DIET:  Regular diet DISCHARGE CONDITION:  Good ACTIVITY:  Activity as tolerated OXYGEN:  Home Oxygen: No.  Oxygen Delivery: room air DISCHARGE LOCATION:  home   If you experience worsening of your admission symptoms, develop shortness of breath, life threatening emergency, suicidal or homicidal thoughts you must seek medical attention immediately  by calling 911 or calling your MD immediately  if symptoms less severe.  You Must read complete instructions/literature along with all the possible adverse reactions/side effects for all the Medicines you take and that have been prescribed to you. Take any new Medicines after you have completely understood and accpet all the possible adverse reactions/side effects.   Please note  You were cared for by a hospitalist during your hospital stay. If you have any questions about your discharge medications or the care you received while you were in the hospital after you are discharged, you can call the unit and asked to speak with the hospitalist on call if the hospitalist that took care of you is not available. Once you are discharged, your primary care physician will handle any further medical issues. Please note that NO REFILLS for any discharge medications will be authorized once you are discharged, as it is imperative that you return to your primary care physician (or establish a relationship with a primary care physician if you do not have one) for your aftercare needs so that they can reassess your need for medications and monitor your lab values.    On the day of Discharge:  VITAL SIGNS:  Blood pressure 131/68, pulse 87, temperature 100.1 F (37.8 C), temperature source Oral, resp. rate 16, height 5\' 5"  (1.651 m), weight 63.5 kg (140 lb), SpO2 96 %. PHYSICAL EXAMINATION:  GENERAL:  81 y.o.-year-old patient lying in the bed with no acute distress.  EYES: Pupils equal, round, reactive to light and accommodation. No scleral icterus. Extraocular muscles intact.  HEENT: Head atraumatic, normocephalic. Oropharynx and nasopharynx clear.  NECK:  Supple, no jugular venous distention. No thyroid enlargement, no tenderness.  LUNGS: Normal breath sounds bilaterally, no wheezing, rales,rhonchi or crepitation. No use of accessory muscles of respiration.  CARDIOVASCULAR: S1, S2 normal. No murmurs, rubs, or  gallops.  ABDOMEN: Soft, non-tender, non-distended. Bowel sounds present. No organomegaly or mass.  EXTREMITIES: No pedal edema, cyanosis, or clubbing.  NEUROLOGIC: Cranial nerves II through XII are intact. Muscle strength 5/5 in all extremities. Sensation intact. Gait not checked.  PSYCHIATRIC: The patient is alert and oriented x 3.  SKIN: No obvious rash, lesion, or ulcer.  DATA REVIEW:   CBC Recent Labs  Lab 09/22/17 0508  WBC 9.2  HGB 15.9  HCT 47.2  PLT 129*    Chemistries  Recent Labs  Lab 09/21/17 0510  NA 138  K 3.3*  CL 105  CO2 24  GLUCOSE 100*  BUN 24*  CREATININE 1.28*  CALCIUM 8.7*    Follow-up Information    Annice Needyew, Jason S, MD. Go on 10/20/2017.   Specialties:  Vascular Surgery, Radiology, Interventional Cardiology Why:  at 3:45pm Contact information: [address] [place] [address] [phone]        Mick SellFitzgerald, David P, MD. Go on 09/27/2017.   Specialty:  Infectious Diseases Why:  at 9:45 am Contact information: [address] [facility] [place] [address] [phone]           Management plans discussed with the patient,  family and they are in agreement.  CODE STATUS: Prior   TOTAL TIME TAKING CARE OF THIS PATIENT: 45 minutes.    Delfino LovettVipul Jerris Keltz M.D on 09/24/2017 at 4:06 PM  Between 7am to 6pm - Pager - [phone]  After 6pm go to [email or web address] - Social research officer, governmentpassword [email or web address] [facility]  [facility]  Office  [phone]  CC: Primary care physician; Mick SellFitzgerald, David P, MD   Note: This dictation was prepared with Dragon dictation along with smaller phrase technology. Any transcriptional errors that result from this process are unintentional.

## 2017-10-20 ENCOUNTER — Ambulatory Visit (INDEPENDENT_AMBULATORY_CARE_PROVIDER_SITE_OTHER): Payer: Medicare Other | Admitting: Vascular Surgery

## 2017-10-20 ENCOUNTER — Encounter (INDEPENDENT_AMBULATORY_CARE_PROVIDER_SITE_OTHER): Payer: Self-pay | Admitting: Vascular Surgery

## 2017-10-20 VITALS — BP 153/70 | HR 71 | Resp 16 | Wt 142.4 lb

## 2017-10-20 DIAGNOSIS — I10 Essential (primary) hypertension: Secondary | ICD-10-CM

## 2017-10-20 DIAGNOSIS — E785 Hyperlipidemia, unspecified: Secondary | ICD-10-CM

## 2017-10-20 DIAGNOSIS — I739 Peripheral vascular disease, unspecified: Secondary | ICD-10-CM | POA: Diagnosis not present

## 2017-10-20 MED ORDER — APIXABAN 5 MG PO TABS: 5.0000 mg | ORAL_TABLET | Freq: Two times a day (BID) | ORAL | 5 refills | Status: DC

## 2017-10-20 NOTE — Progress Notes (Signed)
Subjective:    Patient ID: Bryan Welch, male    DOB: 06/20/1936, 81 y.o.   MRN: [id number] Chief Complaint  Patient presents with  . Follow-[facility]    [facility]   The patient presents for his first post procedure follow-up.  The patient is status post a right lower extremity angiogram with angioplasty and stent placement on September 21, 2017.  The patient presents today with a noticeable decrease in his interval claudication distance.  The patient is not able to walk as far as before without experiencing pain to the right calf.  He denies any rest pain or ulceration.  He denies any left lower extremity symptoms.  The patient denies any fever, nausea vomiting.   Review of Systems  Constitutional: Negative.   HENT: Negative.   Eyes: Negative.   Respiratory: Negative.   Cardiovascular:       Right calf claudication  Gastrointestinal: Negative.   Endocrine: Negative.   Genitourinary: Negative.   Musculoskeletal: Negative.   Skin: Negative.   Allergic/Immunologic: Negative.   Neurological: Negative.   Hematological: Negative.   Psychiatric/Behavioral: Negative.       Objective:   Physical Exam  Constitutional: He is oriented to person, place, and time. He appears well-developed and well-nourished.  HENT:  Head: Normocephalic and atraumatic.  Eyes: Conjunctivae are normal. Pupils are equal, round, and reactive to light.  Neck: Normal range of motion.  Cardiovascular: Normal rate, regular rhythm and normal heart sounds.  Pulses:      Radial pulses are 2+ on the right side, and 2+ on the left side.  Hard to palpate pedal pulses however his bilateral feet are warm  Pulmonary/Chest: Effort normal and breath sounds normal.  Musculoskeletal: Normal range of motion. He exhibits no edema.  Neurological: He is alert and oriented to person, place, and time.  Skin: Skin is warm and dry. He is not diaphoretic.  Psychiatric: He has a normal mood and affect. His behavior is normal. Judgment  and thought content normal.  Vitals reviewed.  BP (!) 153/70 (BP Location: Right Arm)   Pulse 71   Resp 16   Wt 142 lb 6.4 oz (64.6 kg)   BMI 23.70 kg/m   Past Medical History:  Diagnosis Date  . Coronary artery disease   . Hyperlipidemia   . Hypertension   . Vascular abnormality    Social History   Socioeconomic History  . Marital status: Widowed    Spouse name: Not on file  . Number of children: Not on file  . Years of education: Not on file  . Highest education level: Not on file  Social Needs  . Financial resource strain: Not on file  . Food insecurity - worry: Not on file  . Food insecurity - inability: Not on file  . Transportation needs - medical: Not on file  . Transportation needs - non-medical: Not on file  Occupational History  . Not on file  Tobacco Use  . Smoking status: Former Games developermoker  . Smokeless tobacco: Never Used  Substance and Sexual Activity  . Alcohol use: Yes    Comment: occasionally  . Drug use: No  . Sexual activity: Not on file  Other Topics Concern  . Not on file  Social History Narrative  . Not on file   Past Surgical History:  Procedure Laterality Date  . BACK SURGERY    . CAROTID STENT Right   . LEG SURGERY Bilateral    stent placement  . PERIPHERAL  VASCULAR BALLOON ANGIOPLASTY Right 09/21/2017   Procedure: PERIPHERAL VASCULAR BALLOON ANGIOPLASTY;  Surgeon: Annice Needyew, Jason S, MD;  Location: [facility] INVASIVE CV LAB;  Service: Cardiovascular;  Laterality: Right;   Family History  Problem Relation Age of Onset  . Cancer Mother   . Cancer Father    Allergies  Allergen Reactions  . Codeine Itching      Assessment & Plan:  The patient presents for his first post procedure follow-up.  The patient is status post a right lower extremity angiogram with angioplasty and stent placement on September 21, 2017.  The patient presents today with a noticeable decrease in his interval claudication distance.  The patient is not able to walk as far as  before without experiencing pain to the right calf.  He denies any rest pain or ulceration.  He denies any left lower extremity symptoms.  The patient denies any fever, nausea vomiting.  1. PAD (peripheral artery disease) (HCC) - Stable Patient with recent stent placement to the popliteal artery He presents today with a noticeable shortening in his claudication distance I will order a right lower extremity arterial duplex to assess the patient's stent  - VAS US LOWER EXTREMITY ARTERIAL DUPLEX; Future  2. Hyperlipidemia, unspecified hyperlipidemia type - Stable Encouraged good control as its slows the progression of atherosclerotic disease  3. Essential hypertension, benign - Stable Encouraged good control as its slows the progression of atherosclerotic disease  Current Outpatient Medications on File Prior to Visit  Medication Sig Dispense Refill  . ALPRAZolam (XANAX) 0.25 MG tablet Take 1 tablet at bedtime by mouth.    Marland Kitchen. amLODipine (NORVASC) 2.5 MG tablet Take 2.5 mg daily by mouth.     Marland Kitchen. apixaban (ELIQUIS) 5 MG TABS tablet Take 1 tablet (5 mg total) 2 (two) times daily by mouth. 60 tablet 0  . aspirin EC 81 MG tablet Take 81 mg daily by mouth.     . Cholecalciferol (VITAMIN D3) 1000 units CAPS Take 1 capsule daily by mouth.     . hydrochlorothiazide (HYDRODIURIL) 25 MG tablet Take 25 mg daily by mouth.     . pravastatin (PRAVACHOL) 20 MG tablet Take 20 mg daily by mouth.     . ranitidine (ZANTAC) 150 MG tablet Take 150 mg at bedtime by mouth.     . tamsulosin (FLOMAX) 0.4 MG CAPS capsule Take 0.4 mg daily by mouth.     . diphenhydrAMINE (BENADRYL) 25 mg capsule Take 25 mg at bedtime by mouth.      No current facility-administered medications on file prior to visit.    There are no Patient Instructions on file for this visit. No Follow-up on file.  KIMBERLY A STEGMAYER, PA-C

## 2017-11-20 ENCOUNTER — Encounter (INDEPENDENT_AMBULATORY_CARE_PROVIDER_SITE_OTHER): Payer: Self-pay | Admitting: Vascular Surgery

## 2017-11-20 ENCOUNTER — Ambulatory Visit (INDEPENDENT_AMBULATORY_CARE_PROVIDER_SITE_OTHER): Payer: Medicare Other

## 2017-11-20 ENCOUNTER — Ambulatory Visit (INDEPENDENT_AMBULATORY_CARE_PROVIDER_SITE_OTHER): Payer: Medicare Other | Admitting: Vascular Surgery

## 2017-11-20 VITALS — BP 134/72 | HR 64 | Resp 16 | Wt 141.6 lb

## 2017-11-20 DIAGNOSIS — I739 Peripheral vascular disease, unspecified: Secondary | ICD-10-CM

## 2017-11-20 DIAGNOSIS — I10 Essential (primary) hypertension: Secondary | ICD-10-CM | POA: Diagnosis not present

## 2017-11-20 DIAGNOSIS — E785 Hyperlipidemia, unspecified: Secondary | ICD-10-CM

## 2017-11-20 MED ORDER — RIVAROXABAN 20 MG PO TABS: 20.0000 mg | ORAL_TABLET | Freq: Every day | ORAL | Status: DC

## 2017-11-20 NOTE — Progress Notes (Signed)
Subjective:    Patient ID: Bryan Welch, male    DOB: 07/27/1936, 82 y.o.   MRN: [id number] Chief Complaint  Patient presents with  . Follow-up    rle art ultrasound    Patient presents for his first post procedure follow-up.  The patient is status post a right lower extremity angiogram with angioplasty and stent placement to the popliteal artery on September 21, 2017.  The patient presents today without complaint.  The patient notes a marketed improvement in his right lower extremity.  The patient states that he is back to dancing twice a week and [place] without any discomfort to the bilateral lower extremity.  The patient denies any rest pain or ulceration to the lower extremity.  The patient notes that Eliquis is costing him $99 a month and is asking to switch to a medication which is more affordable.  The patient underwent a right lower extremity arterial duplex which was notable for triphasic blood flow transitioning to monophasic at the anterior and posterior tibial.  The peroneal is biphasic.  His stent is biphasic and patent.  The patient denies any fever, nausea or vomiting.   Review of Systems  Constitutional: Negative.   HENT: Negative.   Eyes: Negative.   Respiratory: Negative.   Cardiovascular: Negative.   Gastrointestinal: Negative.   Endocrine: Negative.   Genitourinary: Negative.   Musculoskeletal: Negative.   Skin: Negative.   Allergic/Immunologic: Negative.   Neurological: Negative.   Hematological: Negative.   Psychiatric/Behavioral: Negative.       Objective:   Physical Exam  Constitutional: He is oriented to person, place, and time. He appears well-developed and well-nourished. No distress.  HENT:  Head: Normocephalic and atraumatic.  Eyes: Conjunctivae are normal. Pupils are equal, round, and reactive to light.  Neck: Normal range of motion.  Cardiovascular: Normal rate, regular rhythm, normal heart sounds and intact  distal pulses.  Pulses:      Radial pulses are 2+ on the right side, and 2+ on the left side.       Dorsalis pedis pulses are 0 on the right side, and 1+ on the left side.       Posterior tibial pulses are 1+ on the right side, and 1+ on the left side.  Pulmonary/Chest: Effort normal and breath sounds normal.  Musculoskeletal: Normal range of motion. He exhibits no edema.  Neurological: He is alert and oriented to person, place, and time.  Skin: Skin is warm and dry. He is not diaphoretic.  Psychiatric: He has a normal mood and affect. His behavior is normal. Judgment and thought content normal.  Vitals reviewed.  BP 134/72 (BP Location: Right Arm)   Pulse 64   Resp 16   Wt 141 lb 9.6 oz (64.2 kg)   BMI 23.56 kg/m   Past Medical History:  Diagnosis Date  . Coronary artery disease   . Hyperlipidemia   . Hypertension   . Vascular abnormality    Social History   Socioeconomic History  . Marital status: Widowed    Spouse name: Not on file  . Number of children: Not on file  . Years of education: Not on file  . Highest education level: Not on file  Social Needs  . Financial resource strain: Not on file  . Food insecurity - worry: Not on file  . Food insecurity - inability: Not on file  . Transportation needs - medical: Not on file  . Transportation needs -  non-medical: Not on file  Occupational History  . Not on file  Tobacco Use  . Smoking status: Former Games developer  . Smokeless tobacco: Never Used  Substance and Sexual Activity  . Alcohol use: Yes    Comment: occasionally  . Drug use: No  . Sexual activity: Not on file  Other Topics Concern  . Not on file  Social History Narrative  . Not on file   Past Surgical History:  Procedure Laterality Date  . BACK SURGERY    . CAROTID STENT Right   . LEG SURGERY Bilateral    stent placement  . PERIPHERAL VASCULAR BALLOON ANGIOPLASTY Right 09/21/2017   Procedure: PERIPHERAL VASCULAR BALLOON ANGIOPLASTY;  Surgeon: Annice Needy, MD;  Location: [facility] INVASIVE CV LAB;  Service: Cardiovascular;  Laterality: Right;   Family History  Problem Relation Age of Onset  . Cancer Mother   . Cancer Father    Allergies  Allergen Reactions  . Codeine Itching      Assessment & Plan:  Patient presents for his first post procedure follow-up.  The patient is status post a right lower extremity angiogram with angioplasty and stent placement to the popliteal artery on September 21, 2017.  The patient presents today without complaint.  The patient notes a marketed improvement in his right lower extremity.  The patient states that he is back to dancing twice a week and walking around [place] on a daily basis without any discomfort to the bilateral lower extremity.  The patient denies any rest pain or ulceration to the lower extremity.  The patient notes that Eliquis is costing him $99 a month and is asking to switch to a medication which is more affordable.  The patient underwent a right lower extremity arterial duplex which was notable for triphasic blood flow transitioning to monophasic at the anterior and posterior tibial.  The peroneal is biphasic.  His stent is biphasic and patent.  The patient denies any fever, nausea or vomiting.  1. PAD (peripheral artery disease) (HCC) - Stable Patient with a improvement in his claudication/rest pain symptoms to the right lower extremity since his September 21, 2017 intervention where a stent was placed to the popliteal artery. The patient notes that he is back to dancing twice a week and walking around [place] on a daily basis without any discomfort to the bilateral lower extremity. Right lower extremity arterial duplex with a patent stent The patient is asking for his Eliquis to be switched to a medication that is more affordable Xarelto 20 mg 1 tab by mouth daily was called into [facility] I called the patient's home and spoke with his wife to tell her that I called in this medication I  have discussed with the patient at length the risk factors for and pathogenesis of atherosclerotic disease and encouraged a healthy diet, regular exercise regimen and blood pressure / glucose control.  The patient was encouraged to call the office in the interim if he experiences any claudication like symptoms, rest pain or ulcers to his feet / toes.  - VAS [place] ABI WITH/WO TBI; Future - VAS [place] LOWER EXTREMITY ARTERIAL DUPLEX; Future  2. Hyperlipidemia, unspecified hyperlipidemia type - Stable Encouraged good control as its slows the progression of atherosclerotic disease  3. Essential hypertension, benign - Stable Encouraged good control as its slows the progression of atherosclerotic disease  Current Outpatient Medications on File Prior to Visit  Medication Sig Dispense Refill  . ALPRAZolam (XANAX) 0.25 MG tablet  Take 1 tablet at bedtime by mouth.    Marland Kitchen amLODipine (NORVASC) 2.5 MG tablet Take 2.5 mg daily by mouth.     Marland Kitchen apixaban (ELIQUIS) 5 MG TABS tablet Take 1 tablet (5 mg total) 2 (two) times daily by mouth. 60 tablet 0  . apixaban (ELIQUIS) 5 MG TABS tablet Take 1 tablet (5 mg total) by mouth 2 (two) times daily. 60 tablet 5  . aspirin EC 81 MG tablet Take 81 mg daily by mouth.     . Cholecalciferol (VITAMIN D3) 1000 units CAPS Take 1 capsule daily by mouth.     . diphenhydrAMINE (BENADRYL) 25 mg capsule Take 25 mg at bedtime by mouth.     . hydrochlorothiazide (HYDRODIURIL) 25 MG tablet Take 25 mg daily by mouth.     . pravastatin (PRAVACHOL) 20 MG tablet Take 20 mg daily by mouth.     . ranitidine (ZANTAC) 150 MG tablet Take 150 mg at bedtime by mouth.      No current facility-administered medications on file prior to visit.    There are no Patient Instructions on file for this visit. No Follow-up on file.  Kiwan Gadsden A Ruben Pyka, PA-C

## 2018-01-22 ENCOUNTER — Other Ambulatory Visit: Payer: Self-pay | Admitting: Infectious Diseases

## 2018-01-22 DIAGNOSIS — N183 Chronic kidney disease, stage 3 unspecified: Secondary | ICD-10-CM

## 2018-01-24 ENCOUNTER — Ambulatory Visit
Admission: RE | Admit: 2018-01-24 | Discharge: 2018-01-24 | Disposition: A | Discharge status: Home / Self Care | Payer: Medicare Other | Source: Ambulatory Visit | Attending: Infectious Diseases | Admitting: Infectious Diseases

## 2018-01-24 DIAGNOSIS — N1330 Unspecified hydronephrosis: Secondary | ICD-10-CM | POA: Diagnosis not present

## 2018-01-24 DIAGNOSIS — N183 Chronic kidney disease, stage 3 unspecified: Secondary | ICD-10-CM

## 2018-01-24 DIAGNOSIS — I129 Hypertensive chronic kidney disease with stage 1 through stage 4 chronic kidney disease, or unspecified chronic kidney disease: Secondary | ICD-10-CM | POA: Diagnosis present

## 2018-02-09 ENCOUNTER — Encounter: Payer: Self-pay | Admitting: Urology

## 2018-02-09 ENCOUNTER — Ambulatory Visit: Payer: Medicare Other | Admitting: Urology

## 2018-02-09 VITALS — BP 131/72 | HR 76 | Resp 16 | Ht 65.0 in | Wt 138.1 lb

## 2018-02-09 DIAGNOSIS — N1330 Unspecified hydronephrosis: Secondary | ICD-10-CM | POA: Diagnosis not present

## 2018-02-09 DIAGNOSIS — R3129 Other microscopic hematuria: Secondary | ICD-10-CM

## 2018-02-09 DIAGNOSIS — R972 Elevated prostate specific antigen [PSA]: Secondary | ICD-10-CM

## 2018-02-09 LAB — URINALYSIS, COMPLETE
BILIRUBIN UA: NEGATIVE
Glucose, UA: NEGATIVE
KETONES UA: NEGATIVE
LEUKOCYTES UA: NEGATIVE
Nitrite, UA: NEGATIVE
PROTEIN UA: NEGATIVE
Specific Gravity, UA: 1.02 (ref 1.005–1.030)
Urobilinogen, Ur: 0.2 mg/dL (ref 0.2–1.0)
pH, UA: 5.5 (ref 5.0–7.5)

## 2018-02-09 LAB — MICROSCOPIC EXAMINATION
Bacteria, UA: NONE SEEN
Epithelial Cells (non renal): NONE SEEN /hpf (ref 0–10)
WBC, UA: NONE SEEN /hpf (ref 0–5)

## 2018-02-09 NOTE — Progress Notes (Signed)
02/09/2018 1:20 PM   [name] 1936/01/06 [id number]  Referring provider: Mick Sell, MD [address] [place], [place] [address]  Chief complaint: Ultrasound showed poor drainage and right kidney   HPI: Bryan Welch is a 82 year old male seen at the request of Dr. Sampson Goon for evaluation of right hydronephrosis.  Since 2015 he has had mild chronic kidney disease with an estimated GFR between 59 and 54.  In September 2019 his GFR bumped to 12 and a repeat creatinine/GFR in March 2019 was 1.8/37.  A renal ultrasound was ordered which showed moderate hydronephrosis.  He has had moderate to severe hydronephrosis on CT scans dating back to 2006.  In the appearance may be secondary to a UPJ obstruction.  He denies flank or abdominal pain.  He has no bothersome lower urinary tract symptoms and denies gross hematuria.  He is on tamsulosin.  His PSA has been mildly elevated by strict criteria in the 4-5 range with his last PSA 5.98.  PMH: Past Medical History:  Diagnosis Date  . Coronary artery disease   . Hyperlipidemia   . Hypertension   . Vascular abnormality     Surgical History: Past Surgical History:  Procedure Laterality Date  . BACK SURGERY    . CAROTID STENT Right   . LEG SURGERY Bilateral    stent placement  . PERIPHERAL VASCULAR BALLOON ANGIOPLASTY Right 09/21/2017   Procedure: PERIPHERAL VASCULAR BALLOON ANGIOPLASTY;  Surgeon: Annice Needy, MD;  Location: [facility] INVASIVE CV LAB;  Service: Cardiovascular;  Laterality: Right;    Home Medications:  Allergies as of 02/09/2018      Reactions   Codeine Itching      Medication List        Accurate as of 02/09/18  1:20 PM. Always use your most recent med list.          ALPRAZolam 0.25 MG tablet Commonly known as:  XANAX Take 1 tablet at bedtime by mouth.   amLODipine 2.5 MG tablet Commonly known as:  NORVASC Take 2.5 mg daily by mouth.   aspirin EC 81 MG tablet Take 81 mg daily by mouth.     colchicine 0.6 MG tablet Take 2 tablets (1.2mg ) by mouth at first sign of gout flare followed by 1 tablet (0.6mg ) after 1 hour. (Max 1.8mg  within 1 hour)   diclofenac sodium 1 % Gel Commonly known as:  VOLTAREN Apply topically.   diphenhydrAMINE 25 mg capsule Commonly known as:  BENADRYL Take 25 mg at bedtime by mouth.   hydrochlorothiazide 25 MG tablet Commonly known as:  HYDRODIURIL Take 25 mg daily by mouth.   pravastatin 20 MG tablet Commonly known as:  PRAVACHOL Take 20 mg daily by mouth.   ranitidine 150 MG tablet Commonly known as:  ZANTAC Take 150 mg at bedtime by mouth.   rivaroxaban 20 MG Tabs tablet Commonly known as:  XARELTO Take 1 tablet (20 mg total) by mouth daily.   tamsulosin 0.4 MG Caps capsule Commonly known as:  FLOMAX TAKE 1 CAPSULE (0.4 MG TOTAL) BY MOUTH ONCE DAILY. TAKE 30 MINUTES AFTER SAME MEAL EACH DAY.   Vitamin D3 1000 units Caps Take 1 capsule daily by mouth.       Allergies:  Allergies  Allergen Reactions  . Codeine Itching    Family History: Family History  Problem Relation Age of Onset  . Cancer Mother   . Cancer Father     Social History:  reports that he has quit  smoking. He has never used smokeless tobacco. He reports that he drinks alcohol. He reports that he does not use drugs.  ROS: UROLOGY Frequent Urination?: No Hard to postpone urination?: No Burning/pain with urination?: No Get up at night to urinate?: No Leakage of urine?: No Urine stream starts and stops?: Yes Trouble starting stream?: No Do you have to strain to urinate?: No Blood in urine?: No Urinary tract infection?: No Sexually transmitted disease?: No Injury to kidneys or bladder?: No Painful intercourse?: No Weak stream?: No Erection problems?: Yes Penile pain?: No  Gastrointestinal Nausea?: No Vomiting?: No Indigestion/heartburn?: No Diarrhea?: No Constipation?: No  Constitutional Fever: No Night sweats?: No Weight loss?:  Yes Fatigue?: No  Skin Skin rash/lesions?: No Itching?: No  Eyes Blurred vision?: No Double vision?: No  Ears/Nose/Throat Sore throat?: No Sinus problems?: No  Hematologic/Lymphatic Swollen glands?: No Easy bruising?: Yes  Cardiovascular Leg swelling?: No Chest pain?: No  Respiratory Cough?: No Shortness of breath?: No  Endocrine Excessive thirst?: No  Musculoskeletal Back pain?: Yes Joint pain?: No  Neurological Headaches?: No Dizziness?: No  Psychologic Depression?: No Anxiety?: No  Physical Exam: BP 131/72   Pulse 76   Resp 16   Ht 5\' 5"  (1.651 m)   Wt 138 lb 1.6 oz (62.6 kg)   SpO2 98%   BMI 22.98 kg/m   Constitutional:  Alert and oriented, No acute distress. HEENT: [place] AT, moist mucus membranes.  Trachea midline, no masses. Cardiovascular: No clubbing, cyanosis, or edema. Respiratory: Normal respiratory effort, no increased work of breathing. GI: Abdomen is soft, nontender, nondistended, no abdominal masses GU: No CVA tenderness.  Prostate 40 g, smooth without nodules or induration Lymph: No cervical or inguinal lymphadenopathy. Skin: No rashes, bruises or suspicious lesions. Neurologic: Grossly intact, no focal deficits, moving all 4 extremities. Psychiatric: Normal mood and affect.  Laboratory Data: Lab Results  Component Value Date   WBC 9.2 09/22/2017   HGB 15.9 09/22/2017   HCT 47.2 09/22/2017   MCV 86.9 09/22/2017   PLT 129 (L) 09/22/2017    Lab Results  Component Value Date   CREATININE 1.28 (H) 09/21/2017    Urinalysis Dipstick: 2+ blood Microscopy: 3-10 RBC  Pertinent Imaging: Images including prior CTs were reviewed. Results for orders placed during the hospital encounter of 01/24/18  US RENAL   Narrative CLINICAL DATA:  Chronic kidney disease, stage III  EXAM: RENAL / URINARY TRACT ULTRASOUND COMPLETE  COMPARISON:  CT angio abdomen pelvis of 09/20/2017  FINDINGS: Right Kidney:  Length: 11.3 cm. Moderate  hydronephrosis. In review of the prior CT, this hydronephrosis on the right most likely is due to a UPJ narrowing with the more distal ureter appearing normal in caliber. The echogenicity of the renal parenchyma is increased consistent with chronic renal medical disease.  Left Kidney:  Length: 11.7 cm. No hydronephrosis is seen. The renal parenchymal echogenicity is increased consistent with chronic renal medical disease.  Bladder:  Urinary bladder is moderately well seen. Bilateral ureteral jets are visualized.  IMPRESSION: 1. Moderate hydronephrosis. 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 2. Echogenic renal parenchyma bilaterally consistent with chronic renal medical disease.   Electronically Signed   By: Dwyane DeePaul  Barry M.D.   On: 01/24/2018 13:24    Assessment & Plan:   82 year old male with chronic right hydronephrosis dating back to at least 2006.  We discussed the possibility of both obstructive and nonobstructive hydronephrosis.  I recommended scheduling a Lasix  renogram for further evaluation.  If he does have obstructive hydronephrosis discussed placing a ureteral stent may improve his renal function.  Prior to stent placement would also recommend a nephrology opinion.  His PSA is mildly elevated by strict criteria and normal by age-specific guidelines.  Potential causes were discussed including BPH, inflammation and prostate cancer.  He was informed that low-grade prostate cancer is common in men in their late 21s and 59s.  Options of prostate biopsy and surveillance was discussed.  He has elected surveillance which is certainly reasonable.  He was also noted to have microhematuria.  If he eventually needs stent placement cystoscopy will be performed at that time.    Riki Altes, MD  [facility] [address], [address] [place], [place] [address] [phone]

## 2018-02-26 ENCOUNTER — Encounter
Admission: RE | Admit: 2018-02-26 | Discharge: 2018-02-26 | Disposition: A | Discharge status: Home / Self Care | Payer: Medicare Other | Source: Ambulatory Visit | Attending: Urology | Admitting: Urology

## 2018-02-26 DIAGNOSIS — N1330 Unspecified hydronephrosis: Secondary | ICD-10-CM | POA: Diagnosis not present

## 2018-02-26 MED ORDER — TECHNETIUM TC 99M MERTIATIDE
5.4160 | Freq: Once | INTRAVENOUS | Status: AC | PRN
  Administered 2018-02-26: 5.416 via INTRAVENOUS

## 2018-02-26 MED ORDER — FUROSEMIDE 10 MG/ML IJ SOLN
31.0000 mg | Freq: Once | INTRAMUSCULAR | Status: AC
Start: 2018-02-26 — End: 2018-02-26
  Administered 2018-02-26: 31 mg via INTRAVENOUS
  Filled 2018-02-26: qty 3.1

## 2018-03-01 ENCOUNTER — Telehealth: Payer: Self-pay | Admitting: Urology

## 2018-03-01 NOTE — Telephone Encounter (Signed)
Pt called office and said someone called him, but didn't leave a message.  I'm not sure if he needs an upcoming appt.  Please advise.

## 2018-03-06 ENCOUNTER — Ambulatory Visit (INDEPENDENT_AMBULATORY_CARE_PROVIDER_SITE_OTHER): Payer: Medicare Other

## 2018-03-06 ENCOUNTER — Ambulatory Visit (INDEPENDENT_AMBULATORY_CARE_PROVIDER_SITE_OTHER): Payer: Medicare Other | Admitting: Vascular Surgery

## 2018-03-06 ENCOUNTER — Encounter (INDEPENDENT_AMBULATORY_CARE_PROVIDER_SITE_OTHER): Payer: Self-pay | Admitting: Vascular Surgery

## 2018-03-06 VITALS — BP 147/71 | HR 69 | Resp 16 | Ht 65.5 in | Wt 138.0 lb

## 2018-03-06 DIAGNOSIS — I714 Abdominal aortic aneurysm, without rupture, unspecified: Secondary | ICD-10-CM

## 2018-03-06 DIAGNOSIS — I739 Peripheral vascular disease, unspecified: Secondary | ICD-10-CM

## 2018-03-06 DIAGNOSIS — I6523 Occlusion and stenosis of bilateral carotid arteries: Secondary | ICD-10-CM

## 2018-03-06 DIAGNOSIS — E785 Hyperlipidemia, unspecified: Secondary | ICD-10-CM | POA: Diagnosis not present

## 2018-03-06 DIAGNOSIS — I10 Essential (primary) hypertension: Secondary | ICD-10-CM

## 2018-03-06 NOTE — Progress Notes (Signed)
MRN : [id number]  Bryan Welch is a 82 y.o. ([birth year]) male who presents with chief complaint of  Chief Complaint  Patient presents with  . Follow-up  .  History of Present Illness: Patient returns today in follow up of multiple vascular issues.  He is doing well today without any major complaints.  He is studied with carotid duplex today which reveals a widely patent right carotid endarterectomy and stable 1 to 39% left ICA stenosis.  He is now about 5 years status post right carotid endarterectomy. His AAA duplex today shows a stable abdominal aortic aneurysm measuring 3.8 cm in maximal diameter.  Last year this was 3.7 cm.  No aneurysm related symptoms. Specifically, the patient denies new back or abdominal pain, or signs of peripheral embolization He has undergone treatment for PAD and embolization to the right leg previously.  He is currently walking well without any claudication or rest pain.  His ABIs today although probably falsely elevated for medial calcification are good at 0.97 on the right and 1.02 on the left.  He has triphasic flow on the left leg but he does have stenosis in the right popliteal artery likely near his previously placed stent in the right popliteal.  His flow becomes monophasic below this.   Current Outpatient Medications  Medication Sig Dispense Refill  . ALPRAZolam (XANAX) 0.25 MG tablet Take 1 tablet at bedtime by mouth.    Marland Kitchen amLODipine (NORVASC) 2.5 MG tablet Take 2.5 mg daily by mouth.     Marland Kitchen aspirin EC 81 MG tablet Take 81 mg daily by mouth.     . Cholecalciferol (VITAMIN D3) 1000 units CAPS Take 1 capsule daily by mouth.     . colchicine 0.6 MG tablet Take 2 tablets (1.2mg ) by mouth at first sign of gout flare followed by 1 tablet (0.6mg ) after 1 hour. (Max 1.8mg  within 1 hour)    . diclofenac sodium (VOLTAREN) 1 % GEL Apply topically.    . diphenhydrAMINE (BENADRYL) 25 mg capsule Take 25 mg at bedtime by mouth.     . hydrochlorothiazide  (HYDRODIURIL) 25 MG tablet Take 25 mg daily by mouth.     . pravastatin (PRAVACHOL) 20 MG tablet Take 20 mg daily by mouth.     . ranitidine (ZANTAC) 150 MG tablet Take 150 mg at bedtime by mouth.     . rivaroxaban (XARELTO) 20 MG TABS tablet Take 1 tablet (20 mg total) by mouth daily.    . tamsulosin (FLOMAX) 0.4 MG CAPS capsule TAKE 1 CAPSULE (0.4 MG TOTAL) BY MOUTH ONCE DAILY. TAKE 30 MINUTES AFTER SAME MEAL EACH DAY.  3   No current facility-administered medications for this visit.     Past Medical History:  Diagnosis Date  . Coronary artery disease   . Hyperlipidemia   . Hypertension   . Vascular abnormality     Past Surgical History:  Procedure Laterality Date  . BACK SURGERY    . CAROTID STENT Right   . LEG SURGERY Bilateral    stent placement  . PERIPHERAL VASCULAR BALLOON ANGIOPLASTY Right 09/21/2017   Procedure: PERIPHERAL VASCULAR BALLOON ANGIOPLASTY;  Surgeon: Annice Needy, MD;  Location: [facility] INVASIVE CV LAB;  Service: Cardiovascular;  Laterality: Right;    Social History        Social History   Substance Use Topics   . Smoking status: Former Games developer   . Smokeless tobacco: Never Used   . Alcohol use Yes  Comment: occasionally       Family History      Family History  Problem Relation Age of Onset  . Cancer Mother   . Cancer Father          Allergies  Allergen Reactions  . Codeine Itching     REVIEW OF SYSTEMS (Negative unless checked)  Constitutional: Weight loss  Fever  Chills Cardiac: Chest pain   Chest pressure   Palpitations   Shortness of breath when laying flat   Shortness of breath at rest   Shortness of breath with exertion. Vascular:  Pain in legs with walking   Pain in legs at rest   Pain in legs when laying flat   Claudication   Pain in feet when walking  Pain in feet at rest  Pain in feet when laying flat   History of DVT   Phlebitis   Swelling in legs   Varicose veins    Non-healing ulcers Pulmonary:   Uses home oxygen   Productive cough   Hemoptysis   Wheeze  COPD   Asthma Neurologic:  Dizziness  Blackouts   Seizures   History of stroke   History of TIA  Aphasia   Temporary blindness   Dysphagia   Weakness or numbness in arms   Weakness or numbness in legs Musculoskeletal:  Arthritis   Joint swelling   Joint pain   Low back pain Hematologic:  Easy bruising  Easy bleeding   Hypercoagulable state   Anemic  Hepatitis Gastrointestinal:  Blood in stool   Vomiting blood  Gastroesophageal reflux/heartburn   Difficulty swallowing. Genitourinary:  Chronic kidney disease   Difficult urination  Frequent urination  Burning with urination   Blood in urine Skin:  Rashes   Ulcers   Wounds Psychological:  History of anxiety    History of major depression.      Physical Examination  BP (!) 147/71 (BP Location: Right Arm)   Pulse 69   Resp 16   Ht 5' 5.5" (1.664 m)   Wt 62.6 kg (138 lb)   BMI 22.62 kg/m  Gen:  WD/WN, NAD.  Appears younger than stated age Head: [place]/AT, No temporalis wasting. Ear/Nose/Throat: Hearing grossly intact, nares w/o erythema or drainage Eyes: Conjunctiva clear. Sclera non-icteric Neck: Supple.  Trachea midline Pulmonary:  Good air movement, no use of accessory muscles.  Cardiac: Somewhat irregular Vascular:  Vessel Right Left  Radial Palpable Palpable                          PT  1+ palpable Palpable  DP  1+ palpable  1+ palpable    Musculoskeletal: M/S 5/5 throughout.  No deformity or atrophy.  No significant lower extremity edema. Neurologic: Sensation grossly intact in extremities.  Symmetrical.  Speech is fluent.  Psychiatric: Judgment intact, Mood & affect appropriate for pt's clinical situation. Dermatologic: No rashes or ulcers noted.  No cellulitis or open wounds.       Labs Recent Results (from the past 2160 hour(s))  Urinalysis,  Complete     Status: Abnormal   Collection Time: 02/09/18 10:36 AM  Result Value Ref Range   Specific Gravity, UA 1.020 1.005 - 1.030   pH, UA 5.5 5.0 - 7.5   Color, UA Yellow Yellow   Appearance Ur Clear Clear   Leukocytes, UA Negative Negative   Protein, UA Negative Negative/Trace   Glucose, UA Negative Negative   Ketones, UA Negative Negative  RBC, UA 2+ (A) Negative   Bilirubin, UA Negative Negative   Urobilinogen, Ur 0.2 0.2 - 1.0 mg/dL   Nitrite, UA Negative Negative   Microscopic Examination See below:   Microscopic Examination     Status: Abnormal   Collection Time: 02/09/18 10:36 AM  Result Value Ref Range   WBC, UA None seen 0 - 5 /hpf   RBC, UA 3-10 (A) 0 - 2 /hpf   Epithelial Cells (non renal) None seen 0 - 10 /hpf   Bacteria, UA None seen None seen/Few    Radiology Nm Renal Imaging Flow W/pharm  Result Date: 02/26/2018 CLINICAL DATA:  RIGHT hydronephrosis, history kidney stones EXAM: NUCLEAR MEDICINE RENAL SCAN WITH DIURETIC ADMINISTRATION TECHNIQUE: Radionuclide angiographic and sequential renal images were obtained after intravenous injection of radiopharmaceutical. Imaging was continued during slow intravenous injection of Lasix approximately 15 minutes after the start of the examination. RADIOPHARMACEUTICALS:  5.416 mCi Technetium-75m MAG3 IV Pharmaceutical: Lasix 31.3 mg IV 20 minutes into the exam COMPARISON:  Renal ultrasound 01/24/2018 FINDINGS: Flow:  Prompt arterial flow to both kidneys, greater on LEFT Left renogram: Normal uptake, concentration and excretion of tracer by LEFT kidney. No significant tracer retention at the conclusion of the exam. Normal time to peak activity of approximately 2.7 minutes. Normal fall to half maximum activity at 16 minutes. Right renogram: Normal uptake and concentration of tracer. Delayed excretion of tracer into a significantly dilated RIGHT renal collecting system. No washout of tracer is identified either before or following  Lasix administration. Renogram curve demonstrates a continually climbing pattern until the conclusion of the exam. Differential: Left kidney = 57.5 % Right kidney = 42.5 % T1/2 post Lasix : Left kidney = 13.3 min (pre Lasix) Right kidney = N/A min IMPRESSION: Normal LEFT renogram. RIGHT hydronephrosis with no appreciable washout of tracer from the dilated RIGHT renal collecting system either before or at following diuretic administration consistent with high-grade urinary outflow obstruction of the RIGHT kidney. Electronically Signed   By: Ulyses Southward M.D.   On: 02/26/2018 15:10    Assessment/Plan Hyperlipidemia lipid control important in reducing the progression of atherosclerotic disease. Continue statin therapy   Essential hypertension, benign blood pressure control important in reducing the progression of atherosclerotic disease. On appropriate oral medications.   AAA (abdominal aortic aneurysm) without rupture (HCC) His AAA duplex today shows a stable abdominal aortic aneurysm measuring 3.8 cm in maximal diameter.  Last year this was 3.7 cm. Recheck in 1 year  Carotid stenosis He is studied with carotid duplex today which reveals a widely patent right carotid endarterectomy and stable 1 to 39% left ICA stenosis.  He is now about 5 years status post right carotid endarterectomy.  Continue current medical regimen including aspirin and statin agent.  Recheck in 1 year  PAD (peripheral artery disease) (HCC) His ABIs today although probably falsely elevated for medial calcification are good at 0.97 on the right and 1.02 on the left.  He has triphasic flow on the left leg but he does have stenosis in the right popliteal artery likely near his previously placed stent in the right popliteal.  His flow becomes monophasic below this. At this point, he does not have any significant symptoms.  His flow is reasonably well-preserved even with the stenosis in the right popliteal artery.  We are not  going to plan any intervention without significant symptoms at this time.  Continue Xarelto and aspirin.  Recheck in 1 year    Festus Barren, MD  03/06/2018 11:45 AM    This note was created with Dragon medical transcription system.  Any errors from dictation are purely unintentional

## 2018-03-06 NOTE — Assessment & Plan Note (Signed)
He is studied with carotid duplex today which reveals a widely patent right carotid endarterectomy and stable 1 to 39% left ICA stenosis.  He is now about 5 years status post right carotid endarterectomy.  Continue current medical regimen including aspirin and statin agent.  Recheck in 1 year

## 2018-03-06 NOTE — Assessment & Plan Note (Signed)
His AAA duplex today shows a stable abdominal aortic aneurysm measuring 3.8 cm in maximal diameter.  Last year this was 3.7 cm. Recheck in 1 year

## 2018-03-06 NOTE — Assessment & Plan Note (Signed)
His ABIs today although probably falsely elevated for medial calcification are good at 0.97 on the right and 1.02 on the left.  He has triphasic flow on the left leg but he does have stenosis in the right popliteal artery likely near his previously placed stent in the right popliteal.  His flow becomes monophasic below this. At this point, he does not have any significant symptoms.  His flow is reasonably well-preserved even with the stenosis in the right popliteal artery.  We are not going to plan any intervention without significant symptoms at this time.  Continue Xarelto and aspirin.  Recheck in 1 year

## 2018-03-07 NOTE — Telephone Encounter (Signed)
Please schedule follow-up with creatinine in 6 months.

## 2018-03-07 NOTE — Telephone Encounter (Signed)
Patient was contacted regarding his Lasix renogram results.  Findings were consistent with obstruction at the UPJ.  Differential function was 57.5% on the left and 42.5% on the right.  He was recently contacted by [facility] clinic stating his kidney function was stable.  We discussed options of monitoring versus stent placement.  His hydronephrosis has been present since at least 2006.  He would like to to monitor as he is asymptomatic.  Will follow-up in 6 months with a creatinine and repeat a renal scan in 1 year if his creatinine remains stable.

## 2018-05-14 ENCOUNTER — Other Ambulatory Visit (INDEPENDENT_AMBULATORY_CARE_PROVIDER_SITE_OTHER): Payer: Self-pay | Admitting: Vascular Surgery

## 2018-05-22 ENCOUNTER — Encounter (INDEPENDENT_AMBULATORY_CARE_PROVIDER_SITE_OTHER): Payer: Medicare Other

## 2018-05-22 ENCOUNTER — Ambulatory Visit (INDEPENDENT_AMBULATORY_CARE_PROVIDER_SITE_OTHER): Payer: Medicare Other | Admitting: Vascular Surgery

## 2018-07-25 DIAGNOSIS — N183 Chronic kidney disease, stage 3 unspecified: Secondary | ICD-10-CM | POA: Insufficient documentation

## 2018-09-05 ENCOUNTER — Other Ambulatory Visit: Payer: Self-pay | Admitting: Radiology

## 2018-09-05 DIAGNOSIS — N1330 Unspecified hydronephrosis: Secondary | ICD-10-CM

## 2018-09-06 ENCOUNTER — Other Ambulatory Visit: Payer: Medicare Other

## 2018-09-06 DIAGNOSIS — N1330 Unspecified hydronephrosis: Secondary | ICD-10-CM

## 2018-09-07 LAB — CREATININE, SERUM
CREATININE: 1.87 mg/dL — AB (ref 0.76–1.27)
GFR calc Af Amer: 39 mL/min/{1.73_m2} — ABNORMAL LOW (ref >59)
GFR, EST NON AFRICAN AMERICAN: 33 mL/min/{1.73_m2} — AB (ref >59)

## 2018-09-10 ENCOUNTER — Encounter: Payer: Self-pay | Admitting: Urology

## 2018-09-10 ENCOUNTER — Ambulatory Visit: Payer: Medicare Other | Admitting: Urology

## 2018-09-10 VITALS — BP 115/64 | HR 78 | Ht 65.0 in | Wt 135.0 lb

## 2018-09-10 DIAGNOSIS — N183 Chronic kidney disease, stage 3 unspecified: Secondary | ICD-10-CM

## 2018-09-10 DIAGNOSIS — N135 Crossing vessel and stricture of ureter without hydronephrosis: Secondary | ICD-10-CM | POA: Diagnosis not present

## 2018-09-10 NOTE — Progress Notes (Signed)
09/10/2018 1:20 PM   Bryan Welch 1936/02/09 [id number]  Referring provider: Gracelyn Nurse, MD [address] [place], [place] [address]  Chief Complaint  Patient presents with  . Hydronephrosis    6 month   Urologic history: 1.  Right UPJ obstruction with chronic hydronephrosis -Moderate to severe hydronephrosis noted on CT scans dating back to 2006 -Chronic kidney disease with creatinine/GFR 1.28/52 November 2018 -Lasix renal scan April 2019 differential function 57.5% left kidney/42.5% right kidney; washout felt consistent with right outflow obstruction; declined stent placement  2.  Elevated PSA -Mild elevation 4-5 range.  He has elected not to pursue further evaluation or monitoring   HPI: 82 year old male presents for follow-up.  He was last seen April 2019.  In the last 6 months he has no bothersome lower urinary tract symptoms.  Denies flank/abdominal pain or gross hematuria.  A repeat creatinine on 09/06/2018 has increased to 1.87 with an estimated GFR of 33.   PMH: Past Medical History:  Diagnosis Date  . Coronary artery disease   . Hyperlipidemia   . Hypertension   . Vascular abnormality     Surgical History: Past Surgical History:  Procedure Laterality Date  . BACK SURGERY    . CAROTID STENT Right   . LEG SURGERY Bilateral    stent placement  . PERIPHERAL VASCULAR BALLOON ANGIOPLASTY Right 09/21/2017   Procedure: PERIPHERAL VASCULAR BALLOON ANGIOPLASTY;  Surgeon: Annice Needy, MD;  Location: [facility] INVASIVE CV LAB;  Service: Cardiovascular;  Laterality: Right;    Home Medications:  Allergies as of 09/10/2018      Reactions   Codeine Itching      Medication List        Accurate as of 09/10/18  1:20 PM. Always use your most recent med list.          ALPRAZolam 0.25 MG tablet Commonly known as:  XANAX Take 1 tablet at bedtime by mouth.   amLODipine 2.5 MG tablet Commonly known as:  NORVASC Take 2.5 mg daily by mouth.   aspirin  EC 81 MG tablet Take 81 mg daily by mouth.   colchicine 0.6 MG tablet Take 2 tablets (1.2mg ) by mouth at first sign of gout flare followed by 1 tablet (0.6mg ) after 1 hour. (Max 1.8mg  within 1 hour)   diphenhydrAMINE 25 mg capsule Commonly known as:  BENADRYL Take 25 mg at bedtime by mouth.   hydrochlorothiazide 25 MG tablet Commonly known as:  HYDRODIURIL Take 25 mg daily by mouth.   pravastatin 20 MG tablet Commonly known as:  PRAVACHOL Take 20 mg daily by mouth.   tamsulosin 0.4 MG Caps capsule Commonly known as:  FLOMAX TAKE 1 CAPSULE (0.4 MG TOTAL) BY MOUTH ONCE DAILY. TAKE 30 MINUTES AFTER SAME MEAL EACH DAY.   XARELTO 20 MG Tabs tablet Generic drug:  rivaroxaban TAKE 1 TABLET BY MOUTH EVERY DAY *STOP ELIQUIS*       Allergies:  Allergies  Allergen Reactions  . Codeine Itching    Family History: Family History  Problem Relation Age of Onset  . Cancer Mother   . Cancer Father     Social History:  reports that he has quit smoking. He has never used smokeless tobacco. He reports that he drinks alcohol. He reports that he does not use drugs.  ROS: UROLOGY Frequent Urination?: No Hard to postpone urination?: No Burning/pain with urination?: No Get up at night to urinate?: No Leakage of urine?: No Urine stream starts and stops?:  No Trouble starting stream?: No Do you have to strain to urinate?: No Blood in urine?: No Urinary tract infection?: No Sexually transmitted disease?: No Injury to kidneys or bladder?: No Painful intercourse?: No Weak stream?: No Erection problems?: Yes Penile pain?: No  Gastrointestinal Nausea?: No Vomiting?: No Indigestion/heartburn?: No Diarrhea?: No Constipation?: No  Constitutional Fever: No Night sweats?: No Weight loss?: No Fatigue?: No  Skin Skin rash/lesions?: No Itching?: No  Eyes Blurred vision?: No Double vision?: No  Ears/Nose/Throat Sore throat?: No Sinus problems?:  No  Hematologic/Lymphatic Swollen glands?: No Easy bruising?: No  Cardiovascular Leg swelling?: No Chest pain?: No  Respiratory Cough?: No Shortness of breath?: No  Endocrine Excessive thirst?: No  Musculoskeletal Back pain?: No Joint pain?: No  Neurological Headaches?: No Dizziness?: No  Psychologic Depression?: No Anxiety?: No  Physical Exam: BP 115/64   Pulse 78   Ht 5\' 5"  (1.651 m)   Wt 135 lb (61.2 kg)   BMI 22.47 kg/m   Constitutional:  Alert and oriented, No acute distress. HEENT: [place] AT, moist mucus membranes.  Trachea midline, no masses. Cardiovascular: No clubbing, cyanosis, or edema. Respiratory: Normal respiratory effort, no increased work of breathing. GI: Abdomen is soft, nontender, nondistended, no abdominal masses GU: No CVA tenderness Lymph: No cervical or inguinal lymphadenopathy. Skin: No rashes, bruises or suspicious lesions. Neurologic: Grossly intact, no focal deficits, moving all 4 extremities. Psychiatric: Normal mood and affect.   Assessment & Plan:   82 year old male with right hydronephrosis secondary to UPJ obstruction.  His recent creatinine/GFR has significantly changed.  He is reluctant to pursue stent placement at present.  He has not seen a nephrologist and would recommend an initial nephrology evaluation/recommendations.   Riki Altes, MD  [facility] [address] [place], [place] [address] [phone]

## 2018-09-13 ENCOUNTER — Encounter: Payer: Self-pay | Admitting: Urology

## 2018-09-13 DIAGNOSIS — N135 Crossing vessel and stricture of ureter without hydronephrosis: Secondary | ICD-10-CM | POA: Insufficient documentation

## 2018-09-13 DIAGNOSIS — N183 Chronic kidney disease, stage 3 (moderate): Secondary | ICD-10-CM

## 2018-09-13 HISTORY — DX: Crossing vessel and stricture of ureter without hydronephrosis: N13.5

## 2018-10-07 ENCOUNTER — Other Ambulatory Visit (INDEPENDENT_AMBULATORY_CARE_PROVIDER_SITE_OTHER): Payer: Self-pay | Admitting: Vascular Surgery

## 2018-10-23 ENCOUNTER — Other Ambulatory Visit: Payer: Self-pay

## 2018-10-24 ENCOUNTER — Other Ambulatory Visit: Payer: Self-pay | Admitting: Nephrology

## 2018-10-24 DIAGNOSIS — N183 Chronic kidney disease, stage 3 unspecified: Secondary | ICD-10-CM

## 2018-11-05 ENCOUNTER — Ambulatory Visit
Admission: RE | Admit: 2018-11-05 | Discharge: 2018-11-05 | Disposition: A | Discharge status: Home / Self Care | Payer: Medicare Other | Source: Ambulatory Visit | Attending: Nephrology | Admitting: Nephrology

## 2018-11-05 DIAGNOSIS — N183 Chronic kidney disease, stage 3 unspecified: Secondary | ICD-10-CM

## 2019-03-01 ENCOUNTER — Other Ambulatory Visit (INDEPENDENT_AMBULATORY_CARE_PROVIDER_SITE_OTHER): Payer: Self-pay | Admitting: Vascular Surgery

## 2019-03-12 ENCOUNTER — Ambulatory Visit (INDEPENDENT_AMBULATORY_CARE_PROVIDER_SITE_OTHER): Payer: Medicare Other

## 2019-03-12 ENCOUNTER — Other Ambulatory Visit: Payer: Self-pay

## 2019-03-12 ENCOUNTER — Ambulatory Visit (INDEPENDENT_AMBULATORY_CARE_PROVIDER_SITE_OTHER): Payer: Medicare Other | Admitting: Vascular Surgery

## 2019-03-12 ENCOUNTER — Encounter (INDEPENDENT_AMBULATORY_CARE_PROVIDER_SITE_OTHER): Payer: Self-pay | Admitting: Vascular Surgery

## 2019-03-12 VITALS — BP 148/61 | HR 73 | Resp 16 | Wt 137.0 lb

## 2019-03-12 DIAGNOSIS — I6523 Occlusion and stenosis of bilateral carotid arteries: Secondary | ICD-10-CM

## 2019-03-12 DIAGNOSIS — I739 Peripheral vascular disease, unspecified: Secondary | ICD-10-CM

## 2019-03-12 DIAGNOSIS — I10 Essential (primary) hypertension: Secondary | ICD-10-CM

## 2019-03-12 DIAGNOSIS — I714 Abdominal aortic aneurysm, without rupture, unspecified: Secondary | ICD-10-CM

## 2019-03-12 DIAGNOSIS — E785 Hyperlipidemia, unspecified: Secondary | ICD-10-CM

## 2019-03-12 DIAGNOSIS — Z95820 Peripheral vascular angioplasty status with implants and grafts: Secondary | ICD-10-CM

## 2019-03-12 NOTE — Assessment & Plan Note (Signed)
ABIs today are stable at 0.97 on the right and 1.0 on the left.  He does have elevated velocities below his previously placed stent in the right popliteal artery but these are stable from his last study.  Without any limb threatening or lifestyle limiting symptoms currently we will just continue to follow this on an annual basis.  He will contact our office with any changes in the interim.

## 2019-03-12 NOTE — Assessment & Plan Note (Signed)
That is post carotid endarterectomy on the right in 2014 His carotid duplex today shows a patent right carotid endarterectomy without hemodynamically significant recurrent stenosis.  Left carotid artery remains in the 1 to 39% range without significant progression from previous studies. No role for intervention at this level.  Continue to check on an annual basis

## 2019-03-12 NOTE — Progress Notes (Signed)
MRN : [id number]  Bryan Welch is a 83 y.o. (09/14/1936) male who presents with chief complaint of  Chief Complaint  Patient presents with  . Follow-up    2751yr ultrasound follow up  .  History of Present Illness: Patient returns in follow-up of multiple vascular issues.  He is doing well and does not have specific complaints today.  He reports no new aneurysm symptoms. Specifically, the patient denies new back or abdominal pain, or signs of peripheral embolization.  He is not having any lifestyle limiting claudication, ischemic rest pain, or ulceration.  He also denies any focal neurologic symptoms. Specifically, the patient denies amaurosis fugax, speech or swallowing difficulties, or arm or leg weakness or numbness His studies today include an aortic duplex which demonstrates a stable, 3.8 cm infrarenal abdominal aortic aneurysm. ABIs today are stable at 0.97 on the right and 1.0 on the left.  He does have elevated velocities below his previously placed stent in the right popliteal artery but these are stable from his last study. His carotid duplex today shows a patent right carotid endarterectomy without hemodynamically significant recurrent stenosis.  Left carotid artery remains in the 1 to 39% range without significant progression from previous studies.  Current Outpatient Medications  Medication Sig Dispense Refill  . acetaminophen (TYLENOL) 500 MG tablet Take by mouth.    . ALPRAZolam (XANAX) 0.25 MG tablet Take 1 tablet at bedtime by mouth.    Marland Kitchen. amLODipine (NORVASC) 2.5 MG tablet Take 2.5 mg daily by mouth.     Marland Kitchen. aspirin EC 81 MG tablet Take 81 mg daily by mouth.     . Cholecalciferol 25 MCG (1000 UT) capsule Take 2,000 Units by mouth daily.    . diphenhydrAMINE (BENADRYL) 25 mg capsule Take 25 mg at bedtime by mouth.     . famotidine (PEPCID) 10 MG tablet Take by mouth.    . fluticasone (FLONASE) 50 MCG/ACT nasal spray PLACE 1 SPRAY INTO BOTH NOSTRILS 2 (TWO) TIMES DAILY    .  hydrochlorothiazide (HYDRODIURIL) 25 MG tablet Take 25 mg daily by mouth.     . pravastatin (PRAVACHOL) 20 MG tablet Take 20 mg daily by mouth.     . tamsulosin (FLOMAX) 0.4 MG CAPS capsule TAKE 1 CAPSULE (0.4 MG TOTAL) BY MOUTH ONCE DAILY. TAKE 30 MINUTES AFTER SAME MEAL EACH DAY.  3  . XARELTO 20 MG TABS tablet TAKE 1 TABLET BY MOUTH EVERY DAY *STOP ELIQUIS* 90 tablet 1  . colchicine 0.6 MG tablet Take 2 tablets (1.2mg ) by mouth at first sign of gout flare followed by 1 tablet (0.6mg ) after 1 hour. (Max 1.8mg  within 1 hour)     No current facility-administered medications for this visit.     Past Medical History:  Diagnosis Date  . Coronary artery disease   . Hyperlipidemia   . Hypertension   . Vascular abnormality     Past Surgical History:  Procedure Laterality Date  . BACK SURGERY    . CAROTID STENT Right   . LEG SURGERY Bilateral    stent placement  . PERIPHERAL VASCULAR BALLOON ANGIOPLASTY Right 09/21/2017   Procedure: PERIPHERAL VASCULAR BALLOON ANGIOPLASTY;  Surgeon: Annice Needyew, Kevina Piloto S, MD;  Location: [facility];  Service: Cardiovascular;  Laterality: Right;   Social History   Substance Use Topics   . Smoking status: Former Games developermoker   . Smokeless tobacco: Never Used   . Alcohol use Yes     Comment: occasionally  Family History      Family History  Problem Relation Age of Onset  . Cancer Mother   . Cancer Father          Allergies  Allergen Reactions  . Codeine Itching     REVIEW OF SYSTEMS(Negative unless checked)  Constitutional: ?Weight loss[] ?Fever[] ?Chills Cardiac:[] ?Chest pain[] ?Chest pressure[] ?Palpitations ?Shortness of breath when laying flat ?Shortness of breath at rest ?Shortness of breath with exertion. Vascular: ?Pain in legs with walking[] ?Pain in legsat rest[] ?Pain in legs when laying flat ?Claudication ?Pain in feet when walking ?Pain in feet at rest ?Pain  in feet when laying flat ?History of DVT ?Phlebitis ?Swelling in legs ?Varicose veins ?Non-healing ulcers Pulmonary: ?Uses home oxygen ?Productive cough[] ?Hemoptysis ?Wheeze ?COPD ?Asthma Neurologic: ?Dizziness ?Blackouts ?Seizures ?History of stroke ?History of TIA[] ?Aphasia ?Temporary blindness[] ?Dysphagia ?Weaknessor numbness in arms ?Weakness or numbnessin legs Musculoskeletal: ?Arthritis ?Joint swelling ?Joint pain ?Low back pain Hematologic:[] ?Easy bruising[] ?Easy bleeding ?Hypercoagulable state ?Anemic ?Hepatitis Gastrointestinal:[] ?Blood in stool[] ?Vomiting blood[] ?Gastroesophageal reflux/heartburn[] ?Difficulty swallowing. Genitourinary: ?Chronic kidney disease ?Difficulturination ?Frequenturination ?Burning with urination[] ?Blood in urine Skin: ?Rashes ?Ulcers ?Wounds Psychological: ?History of anxiety[] ?History of major depression.    Physical Examination  Vitals:   03/12/19 1059  BP: (!) 148/61  Pulse: 73  Resp: 16  Weight: 137 lb (62.1 kg)   Body mass index is 22.8 kg/m. Gen:  WD/WN, NAD Head: [place]/AT, No temporalis wasting. Ear/Nose/Throat: Hearing grossly intact, nares w/o erythema or drainage, trachea midline Eyes: Conjunctiva clear. Sclera non-icteric Neck: Supple.  No bruit  Pulmonary:  Good air movement, equal and clear to auscultation bilaterally.  Cardiac: RRR, No JVD Vascular:  Vessel Right Left  Radial Palpable Palpable                          PT 1+ Palpable 1+ Palpable  DP 1+ Palpable 2+ Palpable   Gastrointestinal: soft, non-tender/non-distended.  Musculoskeletal: M/S 5/5 throughout.  No deformity or atrophy. No edema. Neurologic: CN 2-12 intact. Sensation grossly intact in extremities.  Symmetrical.  Speech is fluent. Motor exam as listed above. Psychiatric: Judgment intact, Mood & affect  appropriate for pt's clinical situation. Dermatologic: No rashes or ulcers noted.  No cellulitis or open wounds.      CBC Lab Results  Component Value Date   WBC 9.2 09/22/2017   HGB 15.9 09/22/2017   HCT 47.2 09/22/2017   MCV 86.9 09/22/2017   PLT 129 (L) 09/22/2017    BMET    Component Value Date/Time   NA 138 09/21/2017 0510   NA 141 03/22/2013 0237   K 3.3 (L) 09/21/2017 0510   K 4.3 03/22/2013 0237   CL 105 09/21/2017 0510   CL 110 (H) 03/22/2013 0237   CO2 24 09/21/2017 0510   CO2 24 03/22/2013 0237   GLUCOSE 100 (H) 09/21/2017 0510   GLUCOSE 101 (H) 03/22/2013 0237   BUN 24 (H) 09/21/2017 0510   BUN 19 (H) 03/22/2013 0237   CREATININE 1.87 (H) 09/06/2018 0835   CREATININE 1.53 (H) 03/22/2013 0237   CALCIUM 8.7 (L) 09/21/2017 0510   CALCIUM 8.1 (L) 03/22/2013 0237   GFRNONAA 33 (L) 09/06/2018 0835   GFRNONAA 44 (L) 03/22/2013 0237   GFRAA 39 (L) 09/06/2018 0835   GFRAA 51 (L) 03/22/2013 0237   CrCl cannot be calculated (Patient's most recent lab result is older than the maximum 21 days allowed.).  COAG Lab Results  Component Value Date   INR 1.03 09/22/2017   INR 1.00 09/21/2017   INR 0.93  09/20/2017    Radiology No results found.    Assessment/Plan Hyperlipidemia lipid control important in reducing the progression of atherosclerotic disease. Continue statin therapy   Essential hypertension, benign blood pressure control important in reducing the progression of atherosclerotic disease. On appropriate oral medications.  AAA (abdominal aortic aneurysm) without rupture (HCC) aortic duplex which demonstrates a stable, 3.8 cm infrarenal abdominal aortic aneurysm. No surgery or intervention at this time. The patient has an asymptomatic abdominal aortic aneurysm that is less than 4 cm in maximal diameter.  I have discussed the natural history of abdominal aortic aneurysm and the small risk of rupture for aneurysm less than 5 cm in size.  However, as  these small aneurysms tend to enlarge over time, continued surveillance with ultrasound or CT scan is mandatory.  I have also discussed optimizing medical management with hypertension and lipid control and the importance of abstinence from tobacco.  The patient is also encouraged to exercise a minimum of 30 minutes 4 times a week.  Should the patient develop new onset abdominal or back pain or signs of peripheral embolization they are instructed to seek medical attention immediately and to alert the physician providing care that they have an aneurysm.  The patient voices their understanding. The patient will return in 12 months with an aortic duplex.   PAD (peripheral artery disease) (HCC) ABIs today are stable at 0.97 on the right and 1.0 on the left.  He does have elevated velocities below his previously placed stent in the right popliteal artery but these are stable from his last study.  Without any limb threatening or lifestyle limiting symptoms currently we will just continue to follow this on an annual basis.  He will contact our office with any changes in the interim.  Carotid stenosis That is post carotid endarterectomy on the right in 2014 His carotid duplex today shows a patent right carotid endarterectomy without hemodynamically significant recurrent stenosis.  Left carotid artery remains in the 1 to 39% range without significant progression from previous studies. No role for intervention at this level.  Continue to check on an annual basis    Festus Barren, MD  03/12/2019 12:10 PM    This note was created with Dragon medical transcription system.  Any errors from dictation are purely unintentional

## 2019-03-12 NOTE — Assessment & Plan Note (Signed)
aortic duplex which demonstrates a stable, 3.8 cm infrarenal abdominal aortic aneurysm. No surgery or intervention at this time. The patient has an asymptomatic abdominal aortic aneurysm that is less than 4 cm in maximal diameter.  I have discussed the natural history of abdominal aortic aneurysm and the small risk of rupture for aneurysm less than 5 cm in size.  However, as these small aneurysms tend to enlarge over time, continued surveillance with ultrasound or CT scan is mandatory.  I have also discussed optimizing medical management with hypertension and lipid control and the importance of abstinence from tobacco.  The patient is also encouraged to exercise a minimum of 30 minutes 4 times a week.  Should the patient develop new onset abdominal or back pain or signs of peripheral embolization they are instructed to seek medical attention immediately and to alert the physician providing care that they have an aneurysm.  The patient voices their understanding. The patient will return in 12 months with an aortic duplex.

## 2019-04-22 ENCOUNTER — Ambulatory Visit: Admit: 2019-04-22 | Payer: Medicare Other | Admitting: Ophthalmology

## 2019-04-22 SURGERY — PHACOEMULSIFICATION, CATARACT, WITH IOL INSERTION
Anesthesia: Topical | Laterality: Left

## 2019-05-29 ENCOUNTER — Other Ambulatory Visit: Payer: Self-pay

## 2019-06-06 ENCOUNTER — Encounter: Payer: Self-pay | Admitting: *Deleted

## 2019-06-06 ENCOUNTER — Other Ambulatory Visit: Payer: Self-pay

## 2019-06-06 NOTE — Anesthesia Preprocedure Evaluation (Addendum)
Anesthesia Evaluation  Patient identified by MRN, date of birth, ID band Patient awake    Airway Mallampati: I  TM Distance: >3 FB Neck ROM: Full    Dental  (+) Upper Dentures, Lower Dentures   Pulmonary COPD, former smoker,    Pulmonary exam normal        Cardiovascular Exercise Tolerance: Good hypertension, + Peripheral Vascular Disease (carotid stenosis, lower extremity arterial stent. on Xarelto)  Normal cardiovascular exam  AAA 3.8cm, stable   Neuro/Psych    GI/Hepatic GERD  ,  Endo/Other    Renal/GU Renal disease (stage III CKD; hx nephrolithiasis)     Musculoskeletal Gout    Abdominal   Peds  Hematology   Anesthesia Other Findings Vascular surgery note 03/12/19:  Assessment/Plan Hyperlipidemia lipid control important in reducing the progression of atherosclerotic disease. Continue statin therapy  Essential hypertension, benign blood pressure control important in reducing the progression of atherosclerotic disease. On appropriate oral medications.  AAA (abdominal aortic aneurysm) without rupture (HCC) aortic duplex which demonstrates a stable, 3.8 cm infrarenal abdominal aortic aneurysm. No surgery or intervention at this time. The patient has an asymptomatic abdominal aortic aneurysm that is less than 4 cm in maximal diameter.  I have discussed the natural history of abdominal aortic aneurysm and the small risk of rupture for aneurysm less than 5 cm in size.  However, as these small aneurysms tend to enlarge over time, continued surveillance with ultrasound or CT scan is mandatory.  I have also discussed optimizing medical management with hypertension and lipid control and the importance of abstinence from tobacco.  The patient is also encouraged to exercise a minimum of 30 minutes 4 times a week.  Should the patient develop new onset abdominal or back pain or signs of peripheral embolization they are instructed  to seek medical attention immediately and to alert the physician providing care that they have an aneurysm.  The patient voices their understanding. The patient will return in 12 months with an aortic duplex.  PAD (peripheral artery disease) (HCC) ABIs today are stable at 0.97 on the right and 1.0 on the left.  He does have elevated velocities below his previously placed stent in the right popliteal artery but these are stable from his last study.  Without any limb threatening or lifestyle limiting symptoms currently we will just continue to follow this on an annual basis.  He will contact our office with any changes in the interim.  Carotid stenosis That is post carotid endarterectomy on the right in 2014 His carotid duplex today shows a patent right carotid endarterectomy without hemodynamically significant recurrent stenosis.  Left carotid artery remains in the 1 to 39% range without significant progression from previous studies. No role for intervention at this level.  Continue to check on an annual basis  [name], MD  03/12/2019  Reproductive/Obstetrics                           Anesthesia Physical Anesthesia Plan  ASA: III  Anesthesia Plan: MAC   Post-op Pain Management:    Induction: Intravenous  PONV Risk Score and Plan: 1  Airway Management Planned: Nasal Cannula  Additional Equipment: None  Intra-op Plan:   Post-operative Plan:   Informed Consent: I have reviewed the patients History and Physical, chart, labs and discussed the procedure including the risks, benefits and alternatives for the proposed anesthesia with the patient or authorized representative who has indicated his/her understanding and  acceptance.       Plan Discussed with:   Anesthesia Plan Comments:         Anesthesia Quick Evaluation

## 2019-06-12 NOTE — Discharge Instructions (Signed)

## 2019-06-13 ENCOUNTER — Other Ambulatory Visit
Admission: RE | Admit: 2019-06-13 | Discharge: 2019-06-13 | Disposition: A | Discharge status: Home / Self Care | Payer: Medicare Other | Source: Ambulatory Visit | Attending: Ophthalmology | Admitting: Ophthalmology

## 2019-06-13 ENCOUNTER — Other Ambulatory Visit: Payer: Self-pay

## 2019-06-13 DIAGNOSIS — Z01812 Encounter for preprocedural laboratory examination: Secondary | ICD-10-CM | POA: Diagnosis present

## 2019-06-13 DIAGNOSIS — Z20828 Contact with and (suspected) exposure to other viral communicable diseases: Secondary | ICD-10-CM | POA: Insufficient documentation

## 2019-06-13 LAB — SARS CORONAVIRUS 2 (TAT 6-24 HRS): SARS Coronavirus 2: NEGATIVE

## 2019-06-17 ENCOUNTER — Ambulatory Visit: Payer: Medicare Other | Admitting: Anesthesiology

## 2019-06-17 ENCOUNTER — Other Ambulatory Visit: Payer: Self-pay

## 2019-06-17 ENCOUNTER — Ambulatory Visit
Admission: RE | Admit: 2019-06-17 | Discharge: 2019-06-17 | Disposition: A | Discharge status: Home / Self Care | Payer: Medicare Other | Attending: Ophthalmology | Admitting: Ophthalmology

## 2019-06-17 ENCOUNTER — Encounter
Admission: RE | Disposition: A | Discharge status: Home / Self Care | Payer: Self-pay | Source: Home / Self Care | Attending: Ophthalmology

## 2019-06-17 DIAGNOSIS — K219 Gastro-esophageal reflux disease without esophagitis: Secondary | ICD-10-CM | POA: Diagnosis not present

## 2019-06-17 DIAGNOSIS — I6529 Occlusion and stenosis of unspecified carotid artery: Secondary | ICD-10-CM | POA: Diagnosis not present

## 2019-06-17 DIAGNOSIS — Z79899 Other long term (current) drug therapy: Secondary | ICD-10-CM | POA: Insufficient documentation

## 2019-06-17 DIAGNOSIS — I129 Hypertensive chronic kidney disease with stage 1 through stage 4 chronic kidney disease, or unspecified chronic kidney disease: Secondary | ICD-10-CM | POA: Insufficient documentation

## 2019-06-17 DIAGNOSIS — Z7982 Long term (current) use of aspirin: Secondary | ICD-10-CM | POA: Insufficient documentation

## 2019-06-17 DIAGNOSIS — J449 Chronic obstructive pulmonary disease, unspecified: Secondary | ICD-10-CM | POA: Diagnosis not present

## 2019-06-17 DIAGNOSIS — I2510 Atherosclerotic heart disease of native coronary artery without angina pectoris: Secondary | ICD-10-CM | POA: Diagnosis not present

## 2019-06-17 DIAGNOSIS — Z95828 Presence of other vascular implants and grafts: Secondary | ICD-10-CM | POA: Insufficient documentation

## 2019-06-17 DIAGNOSIS — Z7901 Long term (current) use of anticoagulants: Secondary | ICD-10-CM | POA: Diagnosis not present

## 2019-06-17 DIAGNOSIS — E7800 Pure hypercholesterolemia, unspecified: Secondary | ICD-10-CM | POA: Insufficient documentation

## 2019-06-17 DIAGNOSIS — H2512 Age-related nuclear cataract, left eye: Secondary | ICD-10-CM | POA: Diagnosis not present

## 2019-06-17 DIAGNOSIS — K449 Diaphragmatic hernia without obstruction or gangrene: Secondary | ICD-10-CM | POA: Diagnosis not present

## 2019-06-17 DIAGNOSIS — I714 Abdominal aortic aneurysm, without rupture: Secondary | ICD-10-CM | POA: Diagnosis not present

## 2019-06-17 DIAGNOSIS — Z87891 Personal history of nicotine dependence: Secondary | ICD-10-CM | POA: Insufficient documentation

## 2019-06-17 DIAGNOSIS — N183 Chronic kidney disease, stage 3 (moderate): Secondary | ICD-10-CM | POA: Insufficient documentation

## 2019-06-17 DIAGNOSIS — E785 Hyperlipidemia, unspecified: Secondary | ICD-10-CM | POA: Insufficient documentation

## 2019-06-17 HISTORY — DX: Presence of other vascular implants and grafts: Z95.828

## 2019-06-17 HISTORY — DX: Unspecified osteoarthritis, unspecified site: M19.90

## 2019-06-17 HISTORY — DX: Presence of dental prosthetic device (complete) (partial): Z97.2

## 2019-06-17 HISTORY — PX: CATARACT EXTRACTION W/PHACO: SHX586

## 2019-06-17 HISTORY — DX: Disorder of kidney and ureter, unspecified: N28.9

## 2019-06-17 SURGERY — PHACOEMULSIFICATION, CATARACT, WITH IOL INSERTION
Anesthesia: Monitor Anesthesia Care | Site: Eye | Laterality: Left

## 2019-06-17 MED ORDER — MOXIFLOXACIN HCL 0.5 % OP SOLN
OPHTHALMIC | Status: DC | PRN
  Administered 2019-06-17: 0.2 mL via OPHTHALMIC

## 2019-06-17 MED ORDER — TETRACAINE HCL 0.5 % OP SOLN
1.0000 [drp] | OPHTHALMIC | Status: DC | PRN
  Administered 2019-06-17 (×3): 1 [drp] via OPHTHALMIC

## 2019-06-17 MED ORDER — ARMC OPHTHALMIC DILATING DROPS
1.0000 "application " | OPHTHALMIC | Status: DC | PRN
  Administered 2019-06-17 (×2): 1 via OPHTHALMIC

## 2019-06-17 MED ORDER — SODIUM HYALURONATE 10 MG/ML IO SOLN
INTRAOCULAR | Status: DC | PRN
  Administered 2019-06-17: 0.55 mL via INTRAOCULAR

## 2019-06-17 MED ORDER — SODIUM HYALURONATE 23 MG/ML IO SOLN
INTRAOCULAR | Status: DC | PRN
  Administered 2019-06-17: 0.6 mL via INTRAOCULAR

## 2019-06-17 MED ORDER — FENTANYL CITRATE (PF) 100 MCG/2ML IJ SOLN
INTRAMUSCULAR | Status: DC | PRN
  Administered 2019-06-17: 50 ug via INTRAVENOUS

## 2019-06-17 MED ORDER — LIDOCAINE HCL (PF) 2 % IJ SOLN
INTRAOCULAR | Status: DC | PRN
  Administered 2019-06-17: 1 mL via INTRAOCULAR

## 2019-06-17 MED ORDER — MIDAZOLAM HCL 2 MG/2ML IJ SOLN
INTRAMUSCULAR | Status: DC | PRN
  Administered 2019-06-17: 1 mg via INTRAVENOUS

## 2019-06-17 MED ORDER — EPINEPHRINE PF 1 MG/ML IJ SOLN
INTRAOCULAR | Status: DC | PRN
  Administered 2019-06-17: 78 mL via OPHTHALMIC

## 2019-06-17 SURGICAL SUPPLY — 17 items
CANNULA ANT/CHMB 27GA (MISCELLANEOUS) ×6 IMPLANT
DISSECTOR HYDRO NUCLEUS 50X22 (MISCELLANEOUS) ×3 IMPLANT
GLOVE SURG LX 7.5 STRW (GLOVE) ×4
GLOVE SURG LX STRL 7.5 STRW (GLOVE) ×2 IMPLANT
GLOVE SURG SYN 8.5  E (GLOVE) ×2
GLOVE SURG SYN 8.5 E (GLOVE) ×1 IMPLANT
GOWN STRL REUS W/ TWL LRG LVL3 (GOWN DISPOSABLE) ×2 IMPLANT
GOWN STRL REUS W/TWL LRG LVL3 (GOWN DISPOSABLE) ×4
LENS IOL TECNIS ITEC 27.0 (Intraocular Lens) ×3 IMPLANT
MARKER SKIN DUAL TIP RULER LAB (MISCELLANEOUS) ×3 IMPLANT
PACK DR. KING ARMS (PACKS) ×3 IMPLANT
PACK EYE AFTER SURG (MISCELLANEOUS) ×3 IMPLANT
PACK OPTHALMIC (MISCELLANEOUS) ×3 IMPLANT
SYR 3ML LL SCALE MARK (SYRINGE) ×3 IMPLANT
SYR TB 1ML LUER SLIP (SYRINGE) ×3 IMPLANT
WATER STERILE IRR 250ML POUR (IV SOLUTION) ×3 IMPLANT
WIPE NON LINTING 3.25X3.25 (MISCELLANEOUS) ×3 IMPLANT

## 2019-06-17 NOTE — Op Note (Signed)
OPERATIVE NOTE  MILAM ALLBAUGH [id number] 06/17/2019   PREOPERATIVE DIAGNOSIS:  Nuclear sclerotic cataract left eye.  H25.12   POSTOPERATIVE DIAGNOSIS:    Nuclear sclerotic cataract left eye.     PROCEDURE:  Phacoemusification with posterior chamber intraocular lens placement of the left eye   LENS:   Implant Name Type Inv. Item Serial No. Manufacturer Lot No. LRB No. Used Action  LENS IOL DIOP 27.0 - [id number] Intraocular Lens LENS IOL DIOP 27.0 [id number] AMO  Left 1 Implanted       PCB00 +27.0   ULTRASOUND TIME: 0 minutes 54 seconds.  CDE 7.39   SURGEON:  Benay Pillow, MD, MPH   ANESTHESIA:  Topical with tetracaine drops augmented with 1% preservative-free intracameral lidocaine.  ESTIMATED BLOOD LOSS: <1 mL   COMPLICATIONS:  None.   DESCRIPTION OF PROCEDURE:  The patient was identified in the holding room and transported to the operating room and placed in the supine position under the operating microscope.  The left eye was identified as the operative eye and it was prepped and draped in the usual sterile ophthalmic fashion.   A 1.0 millimeter clear-corneal paracentesis was made at the 5:00 position. 0.5 ml of preservative-free 1% lidocaine with epinephrine was injected into the anterior chamber.  The anterior chamber was filled with Healon 5 viscoelastic.  A 2.4 millimeter keratome was used to make a near-clear corneal incision at the 2:00 position.  A curvilinear capsulorrhexis was made with a cystotome and capsulorrhexis forceps.  Balanced salt solution was used to hydrodissect and hydrodelineate the nucleus.   Phacoemulsification was then used in stop and chop fashion to remove the lens nucleus and epinucleus.  The remaining cortex was then removed using the irrigation and aspiration handpiece. Healon was then placed into the capsular bag to distend it for lens placement.  A lens was then injected into the capsular bag.  The remaining viscoelastic was aspirated.    Wounds were hydrated with balanced salt solution.  The anterior chamber was inflated to a physiologic pressure with balanced salt solution.  Intracameral vigamox 0.1 mL undiltued was injected into the eye and a drop placed onto the ocular surface.  No wound leaks were noted.  The patient was taken to the recovery room in stable condition without complications of anesthesia or surgery  Benay Pillow 06/17/2019, 10:08 AM

## 2019-06-17 NOTE — Transfer of Care (Signed)
Immediate Anesthesia Transfer of Care Note  [name]  Procedure(s) Performed: CATARACT EXTRACTION PHACO AND INTRAOCULAR LENS PLACEMENT (IOC) LEFT (Left Eye)  Patient Location: PACU  Anesthesia Type: MAC  Level of Consciousness: awake, alert  and patient cooperative  Airway and Oxygen Therapy: Patient Spontanous Breathing and Patient connected to supplemental oxygen  Post-op Assessment: Post-op Vital signs reviewed, Patient's Cardiovascular Status Stable, Respiratory Function Stable, Patent Airway and No signs of Nausea or vomiting  Post-op Vital Signs: Reviewed and stable  Complications: No apparent anesthesia complications

## 2019-06-17 NOTE — Anesthesia Postprocedure Evaluation (Signed)
Anesthesia Post Note  [name]  Procedure(s) Performed: CATARACT EXTRACTION PHACO AND INTRAOCULAR LENS PLACEMENT (IOC) LEFT (Left Eye)  Patient location during evaluation: PACU Anesthesia Type: MAC Level of consciousness: awake and alert Pain management: pain level controlled Vital Signs Assessment: post-procedure vital signs reviewed and stable Respiratory status: spontaneous breathing, nonlabored ventilation, respiratory function stable and patient connected to nasal cannula oxygen Cardiovascular status: stable and blood pressure returned to baseline Postop Assessment: no apparent nausea or vomiting Anesthetic complications: no    Adele Barthel Tangela Dolliver

## 2019-06-17 NOTE — H&P (Signed)

## 2019-06-18 ENCOUNTER — Encounter: Payer: Self-pay | Admitting: Ophthalmology

## 2019-07-01 ENCOUNTER — Encounter: Payer: Self-pay | Admitting: *Deleted

## 2019-07-01 ENCOUNTER — Other Ambulatory Visit: Payer: Self-pay

## 2019-07-04 ENCOUNTER — Other Ambulatory Visit: Payer: Self-pay

## 2019-07-04 ENCOUNTER — Other Ambulatory Visit
Admission: RE | Admit: 2019-07-04 | Discharge: 2019-07-04 | Disposition: A | Discharge status: Home / Self Care | Payer: Medicare Other | Source: Ambulatory Visit | Attending: Ophthalmology | Admitting: Ophthalmology

## 2019-07-04 DIAGNOSIS — Z01812 Encounter for preprocedural laboratory examination: Secondary | ICD-10-CM | POA: Diagnosis present

## 2019-07-04 DIAGNOSIS — Z20828 Contact with and (suspected) exposure to other viral communicable diseases: Secondary | ICD-10-CM | POA: Diagnosis not present

## 2019-07-04 LAB — SARS CORONAVIRUS 2 (TAT 6-24 HRS): SARS Coronavirus 2: NEGATIVE

## 2019-07-04 NOTE — Discharge Instructions (Signed)

## 2019-07-08 ENCOUNTER — Other Ambulatory Visit: Payer: Self-pay

## 2019-07-08 ENCOUNTER — Ambulatory Visit: Payer: Medicare Other | Admitting: Anesthesiology

## 2019-07-08 ENCOUNTER — Encounter
Admission: RE | Disposition: A | Discharge status: Home / Self Care | Payer: Self-pay | Source: Home / Self Care | Attending: Ophthalmology

## 2019-07-08 ENCOUNTER — Ambulatory Visit
Admission: RE | Admit: 2019-07-08 | Discharge: 2019-07-08 | Disposition: A | Discharge status: Home / Self Care | Payer: Medicare Other | Attending: Ophthalmology | Admitting: Ophthalmology

## 2019-07-08 DIAGNOSIS — M1990 Unspecified osteoarthritis, unspecified site: Secondary | ICD-10-CM | POA: Diagnosis not present

## 2019-07-08 DIAGNOSIS — Z7901 Long term (current) use of anticoagulants: Secondary | ICD-10-CM | POA: Insufficient documentation

## 2019-07-08 DIAGNOSIS — I10 Essential (primary) hypertension: Secondary | ICD-10-CM | POA: Diagnosis not present

## 2019-07-08 DIAGNOSIS — I2510 Atherosclerotic heart disease of native coronary artery without angina pectoris: Secondary | ICD-10-CM | POA: Diagnosis not present

## 2019-07-08 DIAGNOSIS — Z7982 Long term (current) use of aspirin: Secondary | ICD-10-CM | POA: Insufficient documentation

## 2019-07-08 DIAGNOSIS — Z79899 Other long term (current) drug therapy: Secondary | ICD-10-CM | POA: Diagnosis not present

## 2019-07-08 DIAGNOSIS — I714 Abdominal aortic aneurysm, without rupture: Secondary | ICD-10-CM | POA: Insufficient documentation

## 2019-07-08 DIAGNOSIS — E7800 Pure hypercholesterolemia, unspecified: Secondary | ICD-10-CM | POA: Insufficient documentation

## 2019-07-08 DIAGNOSIS — J449 Chronic obstructive pulmonary disease, unspecified: Secondary | ICD-10-CM | POA: Insufficient documentation

## 2019-07-08 DIAGNOSIS — E785 Hyperlipidemia, unspecified: Secondary | ICD-10-CM | POA: Diagnosis not present

## 2019-07-08 DIAGNOSIS — I739 Peripheral vascular disease, unspecified: Secondary | ICD-10-CM | POA: Insufficient documentation

## 2019-07-08 DIAGNOSIS — K219 Gastro-esophageal reflux disease without esophagitis: Secondary | ICD-10-CM | POA: Insufficient documentation

## 2019-07-08 DIAGNOSIS — Z87891 Personal history of nicotine dependence: Secondary | ICD-10-CM | POA: Insufficient documentation

## 2019-07-08 DIAGNOSIS — Z95820 Peripheral vascular angioplasty status with implants and grafts: Secondary | ICD-10-CM | POA: Diagnosis not present

## 2019-07-08 DIAGNOSIS — H2511 Age-related nuclear cataract, right eye: Secondary | ICD-10-CM | POA: Diagnosis present

## 2019-07-08 HISTORY — PX: CATARACT EXTRACTION W/PHACO: SHX586

## 2019-07-08 SURGERY — PHACOEMULSIFICATION, CATARACT, WITH IOL INSERTION
Anesthesia: Monitor Anesthesia Care | Site: Eye | Laterality: Right

## 2019-07-08 MED ORDER — LACTATED RINGERS IV SOLN: 100.0000 mL/h | INTRAVENOUS | Status: DC

## 2019-07-08 MED ORDER — FENTANYL CITRATE (PF) 100 MCG/2ML IJ SOLN
INTRAMUSCULAR | Status: DC | PRN
  Administered 2019-07-08: 50 ug via INTRAVENOUS

## 2019-07-08 MED ORDER — SODIUM HYALURONATE 23 MG/ML IO SOLN
INTRAOCULAR | Status: DC | PRN
  Administered 2019-07-08: 0.6 mL via INTRAOCULAR

## 2019-07-08 MED ORDER — ARMC OPHTHALMIC DILATING DROPS
1.0000 "application " | OPHTHALMIC | Status: DC | PRN
  Administered 2019-07-08 (×3): 1 via OPHTHALMIC

## 2019-07-08 MED ORDER — TETRACAINE HCL 0.5 % OP SOLN
1.0000 [drp] | OPHTHALMIC | Status: DC | PRN
  Administered 2019-07-08 (×3): 1 [drp] via OPHTHALMIC

## 2019-07-08 MED ORDER — MOXIFLOXACIN HCL 0.5 % OP SOLN
OPHTHALMIC | Status: DC | PRN
  Administered 2019-07-08: 0.2 mL via OPHTHALMIC

## 2019-07-08 MED ORDER — SODIUM HYALURONATE 10 MG/ML IO SOLN
INTRAOCULAR | Status: DC | PRN
  Administered 2019-07-08: 0.55 mL via INTRAOCULAR

## 2019-07-08 MED ORDER — EPINEPHRINE PF 1 MG/ML IJ SOLN
INTRAOCULAR | Status: DC | PRN
  Administered 2019-07-08: 13:00:00 89 mL via OPHTHALMIC

## 2019-07-08 MED ORDER — LIDOCAINE HCL (PF) 2 % IJ SOLN
INTRAOCULAR | Status: DC | PRN
  Administered 2019-07-08: 13:00:00 1 mL via INTRAOCULAR

## 2019-07-08 MED ORDER — ONDANSETRON HCL 4 MG/2ML IJ SOLN: 4.0000 mg | Freq: Once | INTRAMUSCULAR | Status: DC | PRN

## 2019-07-08 MED ORDER — MIDAZOLAM HCL 2 MG/2ML IJ SOLN
INTRAMUSCULAR | Status: DC | PRN
  Administered 2019-07-08: 1 mg via INTRAVENOUS

## 2019-07-08 MED ORDER — ACETAMINOPHEN 10 MG/ML IV SOLN: 1000.0000 mg | Freq: Once | INTRAVENOUS | Status: DC | PRN

## 2019-07-08 SURGICAL SUPPLY — 19 items
CANNULA ANT/CHMB 27G (MISCELLANEOUS) ×2 IMPLANT
CANNULA ANT/CHMB 27GA (MISCELLANEOUS) ×6 IMPLANT
DISSECTOR HYDRO NUCLEUS 50X22 (MISCELLANEOUS) ×3 IMPLANT
GLOVE SURG LX 7.5 STRW (GLOVE) ×2
GLOVE SURG LX STRL 7.5 STRW (GLOVE) ×1 IMPLANT
GLOVE SURG SYN 8.5  E (GLOVE) ×2
GLOVE SURG SYN 8.5 E (GLOVE) ×1 IMPLANT
GLOVE SURG SYN 8.5 PF PI (GLOVE) ×1 IMPLANT
GOWN STRL REUS W/ TWL LRG LVL3 (GOWN DISPOSABLE) ×2 IMPLANT
GOWN STRL REUS W/TWL LRG LVL3 (GOWN DISPOSABLE) ×4
LENS IOL TECNIS ITEC 26.0 (Intraocular Lens) ×2 IMPLANT
MARKER SKIN DUAL TIP RULER LAB (MISCELLANEOUS) ×3 IMPLANT
PACK DR. KING ARMS (PACKS) ×3 IMPLANT
PACK EYE AFTER SURG (MISCELLANEOUS) ×3 IMPLANT
PACK OPTHALMIC (MISCELLANEOUS) ×3 IMPLANT
SYR 3ML LL SCALE MARK (SYRINGE) ×3 IMPLANT
SYR TB 1ML LUER SLIP (SYRINGE) ×3 IMPLANT
WATER STERILE IRR 250ML POUR (IV SOLUTION) ×3 IMPLANT
WIPE NON LINTING 3.25X3.25 (MISCELLANEOUS) ×3 IMPLANT

## 2019-07-08 NOTE — Anesthesia Procedure Notes (Signed)
Procedure Name: MAC Performed by: Ariona Deschene, CRNA Pre-anesthesia Checklist: Patient identified, Emergency Drugs available, Suction available, Timeout performed and Patient being monitored Patient Re-evaluated:Patient Re-evaluated prior to induction Oxygen Delivery Method: Nasal cannula Placement Confirmation: positive ETCO2       

## 2019-07-08 NOTE — Transfer of Care (Signed)
Immediate Anesthesia Transfer of Care Note  [name]  Procedure(s) Performed: CATARACT EXTRACTION PHACO AND INTRAOCULAR LENS PLACEMENT (IOC) right  00:39.2  10.8%  5.02   (Right Eye)  Patient Location: PACU  Anesthesia Type: MAC  Level of Consciousness: awake, alert  and patient cooperative  Airway and Oxygen Therapy: Patient Spontanous Breathing and Patient connected to supplemental oxygen  Post-op Assessment: Post-op Vital signs reviewed, Patient's Cardiovascular Status Stable, Respiratory Function Stable, Patent Airway and No signs of Nausea or vomiting  Post-op Vital Signs: Reviewed and stable  Complications: No apparent anesthesia complications

## 2019-07-08 NOTE — Op Note (Signed)
OPERATIVE NOTE  JONAVON TRIEU [id number] 07/08/2019   PREOPERATIVE DIAGNOSIS:  Nuclear sclerotic cataract right eye.  H25.11   POSTOPERATIVE DIAGNOSIS:    Nuclear sclerotic cataract right eye.     PROCEDURE:  Phacoemusification with posterior chamber intraocular lens placement of the right eye   LENS:   Implant Name Type Inv. Item Serial No. Manufacturer Lot No. LRB No. Used Action  LENS IOL DIOP 26.0 - [id number] Intraocular Lens LENS IOL DIOP 26.0 [id number] AMO  Right 1 Implanted       PCB00 +26.0   ULTRASOUND TIME: 0 minutes 39 seconds.  CDE 5.02   SURGEON:  Benay Pillow, MD, MPH  ANESTHESIOLOGIST: Anesthesiologist: Heniser, Fredric Dine, MD CRNA: Mayme Genta, CRNA   ANESTHESIA:  Topical with tetracaine drops augmented with 1% preservative-free intracameral lidocaine.  ESTIMATED BLOOD LOSS: less than 1 mL.   COMPLICATIONS:  None.   DESCRIPTION OF PROCEDURE:  The patient was identified in the holding room and transported to the operating room and placed in the supine position under the operating microscope.  The right eye was identified as the operative eye and it was prepped and draped in the usual sterile ophthalmic fashion.   A 1.0 millimeter clear-corneal paracentesis was made at the 10:30 position. 0.5 ml of preservative-free 1% lidocaine with epinephrine was injected into the anterior chamber.  The anterior chamber was filled with Healon 5 viscoelastic.  A 2.4 millimeter keratome was used to make a near-clear corneal incision at the 8:00 position.  A curvilinear capsulorrhexis was made with a cystotome and capsulorrhexis forceps.  Balanced salt solution was used to hydrodissect and hydrodelineate the nucleus.   Phacoemulsification was then used in stop and chop fashion to remove the lens nucleus and epinucleus.  The remaining cortex was then removed using the irrigation and aspiration handpiece. Healon was then placed into the capsular bag to distend it for lens  placement.  A lens was then injected into the capsular bag.  The remaining viscoelastic was aspirated.   Wounds were hydrated with balanced salt solution.  The anterior chamber was inflated to a physiologic pressure with balanced salt solution.   Intracameral vigamox 0.1 mL undiluted was injected into the eye and a drop placed onto the ocular surface.  No wound leaks were noted.  The patient was taken to the recovery room in stable condition without complications of anesthesia or surgery  Benay Pillow 07/08/2019, 1:30 PM

## 2019-07-08 NOTE — Anesthesia Preprocedure Evaluation (Addendum)
Anesthesia Evaluation  Patient identified by MRN, date of birth, ID band Patient awake    Reviewed: Allergy & Precautions, NPO status , Patient's Chart, lab work & pertinent test results  History of Anesthesia Complications Negative for: history of anesthetic complications  Airway Mallampati: II  TM Distance: >3 FB Neck ROM: Limited    Dental   Pulmonary COPD, former smoker,    breath sounds clear to auscultation       Cardiovascular hypertension, (-) angina+ CAD and + Peripheral Vascular Disease (carotid stenosis, lower extremity arterial stent. on Xarelto)  (-) DOE  Rhythm:Regular Rate:Normal   HLD   Neuro/Psych    GI/Hepatic neg GERD  ,  Endo/Other    Renal/GU Renal disease     Musculoskeletal  (+) Arthritis ,   Abdominal   Peds  Hematology   Anesthesia Other Findings Vascular surgery note 03/12/19:  Assessment/Plan Hyperlipidemia lipid control important in reducing the progression of atherosclerotic disease. Continue statin therapy  Essential hypertension, benign blood pressure control important in reducing the progression of atherosclerotic disease. On appropriate oral medications.  AAA (abdominal aortic aneurysm) without rupture (HCC) aortic duplex which demonstrates a stable, 3.8 cm infrarenal abdominal aortic aneurysm. No surgery or intervention at this time. The patient has an asymptomatic abdominal aortic aneurysm that is less than 4 cm in maximal diameter.  I have discussed the natural history of abdominal aortic aneurysm and the small risk of rupture for aneurysm less than 5 cm in size.  However, as these small aneurysms tend to enlarge over time, continued surveillance with ultrasound or CT scan is mandatory.  I have also discussed optimizing medical management with hypertension and lipid control and the importance of abstinence from tobacco.  The patient is also encouraged to exercise a minimum of 30  minutes 4 times a week.  Should the patient develop new onset abdominal or back pain or signs of peripheral embolization they are instructed to seek medical attention immediately and to alert the physician providing care that they have an aneurysm.  The patient voices their understanding. The patient will return in 12 months with an aortic duplex.  PAD (peripheral artery disease) (HCC) ABIs today are stable at 0.97 on the right and 1.0 on the left.  He does have elevated velocities below his previously placed stent in the right popliteal artery but these are stable from his last study.  Without any limb threatening or lifestyle limiting symptoms currently we will just continue to follow this on an annual basis.  He will contact our office with any changes in the interim.  Carotid stenosis That is post carotid endarterectomy on the right in 2014 His carotid duplex today shows a patent right carotid endarterectomy without hemodynamically significant recurrent stenosis.  Left carotid artery remains in the 1 to 39% range without significant progression from previous studies. No role for intervention at this level.  Continue to check on an annual basis  [name], MD  03/12/2019  Reproductive/Obstetrics                           Anesthesia Physical Anesthesia Plan  ASA: III  Anesthesia Plan: MAC   Post-op Pain Management:    Induction: Intravenous  PONV Risk Score and Plan: TIVA and Midazolam  Airway Management Planned: Nasal Cannula  Additional Equipment:   Intra-op Plan:   Post-operative Plan:   Informed Consent: I have reviewed the patients History and Physical, chart, labs and discussed  the procedure including the risks, benefits and alternatives for the proposed anesthesia with the patient or authorized representative who has indicated his/her understanding and acceptance.       Plan Discussed with: CRNA and Anesthesiologist  Anesthesia Plan  Comments:        Anesthesia Quick Evaluation

## 2019-07-08 NOTE — Anesthesia Postprocedure Evaluation (Signed)
Anesthesia Post Note  [name]  Procedure(s) Performed: CATARACT EXTRACTION PHACO AND INTRAOCULAR LENS PLACEMENT (IOC) right  00:39.2  10.8%  5.02   (Right Eye)  Patient location during evaluation: PACU Anesthesia Type: MAC Level of consciousness: awake and alert Pain management: pain level controlled Vital Signs Assessment: post-procedure vital signs reviewed and stable Respiratory status: spontaneous breathing, nonlabored ventilation, respiratory function stable and patient connected to nasal cannula oxygen Cardiovascular status: stable and blood pressure returned to baseline Postop Assessment: no apparent nausea or vomiting Anesthetic complications: no    Velmer Broadfoot A  Duha Abair

## 2019-07-08 NOTE — H&P (Signed)

## 2019-07-30 ENCOUNTER — Other Ambulatory Visit (INDEPENDENT_AMBULATORY_CARE_PROVIDER_SITE_OTHER): Payer: Self-pay | Admitting: Vascular Surgery

## 2019-09-06 ENCOUNTER — Other Ambulatory Visit: Payer: Self-pay | Admitting: Student

## 2019-09-06 DIAGNOSIS — S46011D Strain of muscle(s) and tendon(s) of the rotator cuff of right shoulder, subsequent encounter: Secondary | ICD-10-CM

## 2019-09-23 ENCOUNTER — Ambulatory Visit
Admission: RE | Admit: 2019-09-23 | Discharge: 2019-09-23 | Disposition: A | Discharge status: Home / Self Care | Payer: Medicare Other | Source: Ambulatory Visit | Attending: Student | Admitting: Student

## 2019-09-23 ENCOUNTER — Other Ambulatory Visit: Payer: Self-pay

## 2019-09-23 DIAGNOSIS — S46011D Strain of muscle(s) and tendon(s) of the rotator cuff of right shoulder, subsequent encounter: Secondary | ICD-10-CM | POA: Diagnosis not present

## 2019-11-15 ENCOUNTER — Other Ambulatory Visit: Payer: Self-pay | Admitting: Surgery

## 2019-11-20 ENCOUNTER — Encounter
Admission: RE | Admit: 2019-11-20 | Discharge: 2019-11-20 | Disposition: A | Discharge status: Home / Self Care | Payer: Medicare Other | Source: Ambulatory Visit | Attending: Surgery | Admitting: Surgery

## 2019-11-20 ENCOUNTER — Other Ambulatory Visit: Payer: Self-pay

## 2019-11-20 HISTORY — DX: Personal history of other diseases of the digestive system: Z87.19

## 2019-11-20 NOTE — Patient Instructions (Signed)
Your COVID test is scheduled on: Friday 11/22/19. Drive up in front of the [facility] any time 8:00-10:30am.  Your procedure is scheduled on: Tuesday 11/26/19 Report to Same Day Surgery 2nd floor [facility] Entrance-take elevator on left to 2nd floor.  Check in with surgery information desk.) To find out your arrival time, call [phone] 1:00-3:00 PM on Monday 11/25/19  Remember: Instructions that are not followed completely may result in serious medical risk, up to and including death, or upon the discretion of your surgeon and anesthesiologist your surgery may need to be rescheduled.   __x__ 1. Do not eat food (including mints, candies, chewing gum) after midnight the night before your procedure. You may drink clear liquids up to 2 hours before you are scheduled to arrive at the hospital for your procedure.  Do not drink anything within 2 hours of your scheduled arrival to the hospital.  Approved clear liquids:  --Water or Apple juice without pulp  --Clear carbohydrate beverage such as Gatorade or Powerade  --Black Coffee or Clear Tea (No milk, no creamers, do not add anything to the coffee or tea)  __x__ 2. Finish your provided Ensure clear drink (in your bag) 2 hours before your scheduled arrival on the day of surgery.  __x__ 3. No Alcohol or Smoking for 24 hours before or after surgery.   __x__ 4. Notify your doctor if there is any change in your medical condition (cold, fever, infections).  __x__ 5. On the morning of surgery brush your teeth with toothpaste and water.  You may rinse your mouth with mouthwash if you wish.  Do not swallow any toothpaste or mouthwash.  Please read over the following fact sheets that you were given:   [facility] Preparing for Surgery and/or MRSA Information   __x__ Use CHG Soap as directed on instruction sheet.  Do not wear jewelry, lotions, powders, perfumes, or deodorant on the day of surgery. Do not shave below your  neckline for 48 hours prior to surgery.  Do not bring valuables to the hospital.  [facility] is not responsible for any belongings or valuables.   Eyeglasses, contacts, dentures or bridgework may not be worn into surgery. For patients discharged on the day of surgery, you will NOT be permitted to drive yourself home.  You must have a responsible adult with you for 24 hours after surgery.  __x__ Take these medicines on the morning of surgery with a SMALL SIP OF WATER:  1. Amlodipine (Norvasc)  2. Tamsulosin (Flomax)   __x__ Follow recommendations from Cardiologist, Pulmonologist or PCP regarding stopping Xarelto, Aspirin, and other blood thinners such as Coumadin, Plavix, Eliquis, Effient, Pradaxa, and Pletal.  __x__ STARTING TODAY: Do not take any Anti-inflammatories such as Advil, Ibuprofen, Motrin, Aleve, Naproxen, Naprosyn, BC/Goodies powders. You CAN take Tylenol if needed.   __x__ STARTING TODAY: Do not take any over the counter supplements until after surgery. You CAN continue to take your Vitamin D3.

## 2019-11-21 ENCOUNTER — Encounter
Admission: RE | Admit: 2019-11-21 | Discharge: 2019-11-21 | Disposition: A | Discharge status: Home / Self Care | Payer: Medicare Other | Source: Ambulatory Visit | Attending: Surgery | Admitting: Surgery

## 2019-11-21 DIAGNOSIS — Z01818 Encounter for other preprocedural examination: Secondary | ICD-10-CM | POA: Insufficient documentation

## 2019-11-21 DIAGNOSIS — Z20822 Contact with and (suspected) exposure to covid-19: Secondary | ICD-10-CM | POA: Diagnosis not present

## 2019-11-21 LAB — COMPREHENSIVE METABOLIC PANEL
ALT: 11 U/L (ref 0–44)
AST: 20 U/L (ref 15–41)
Albumin: 4.2 g/dL (ref 3.5–5.0)
Alkaline Phosphatase: 58 U/L (ref 38–126)
Anion gap: 11 (ref 5–15)
BUN: 29 mg/dL — ABNORMAL HIGH (ref 8–23)
CO2: 26 mmol/L (ref 22–32)
Calcium: 9.5 mg/dL (ref 8.9–10.3)
Chloride: 102 mmol/L (ref 98–111)
Creatinine, Ser: 1.69 mg/dL — ABNORMAL HIGH (ref 0.61–1.24)
GFR calc Af Amer: 43 mL/min — ABNORMAL LOW (ref >60)
GFR calc non Af Amer: 37 mL/min — ABNORMAL LOW (ref >60)
Glucose, Bld: 108 mg/dL — ABNORMAL HIGH (ref 70–99)
Potassium: 3.9 mmol/L (ref 3.5–5.1)
Sodium: 139 mmol/L (ref 135–145)
Total Bilirubin: 0.6 mg/dL (ref 0.3–1.2)
Total Protein: 7.7 g/dL (ref 6.5–8.1)

## 2019-11-21 LAB — CBC WITH DIFFERENTIAL/PLATELET
Abs Immature Granulocytes: 0.03 10*3/uL (ref 0.00–0.07)
Basophils Absolute: 0.1 10*3/uL (ref 0.0–0.1)
Basophils Relative: 2 %
Eosinophils Absolute: 0.3 10*3/uL (ref 0.0–0.5)
Eosinophils Relative: 5 %
HCT: 35.9 % — ABNORMAL LOW (ref 39.0–52.0)
Hemoglobin: 10.8 g/dL — ABNORMAL LOW (ref 13.0–17.0)
Immature Granulocytes: 1 %
Lymphocytes Relative: 20 %
Lymphs Abs: 1.3 10*3/uL (ref 0.7–4.0)
MCH: 23.3 pg — ABNORMAL LOW (ref 26.0–34.0)
MCHC: 30.1 g/dL (ref 30.0–36.0)
MCV: 77.4 fL — ABNORMAL LOW (ref 80.0–100.0)
Monocytes Absolute: 0.6 10*3/uL (ref 0.1–1.0)
Monocytes Relative: 9 %
Neutro Abs: 4.2 10*3/uL (ref 1.7–7.7)
Neutrophils Relative %: 63 %
Platelets: 296 10*3/uL (ref 150–400)
RBC: 4.64 MIL/uL (ref 4.22–5.81)
RDW: 15.3 % (ref 11.5–15.5)
WBC: 6.6 10*3/uL (ref 4.0–10.5)
nRBC: 0 % (ref 0.0–0.2)

## 2019-11-21 LAB — URINALYSIS, ROUTINE W REFLEX MICROSCOPIC
Bilirubin Urine: NEGATIVE
Glucose, UA: NEGATIVE mg/dL
Hgb urine dipstick: NEGATIVE
Ketones, ur: NEGATIVE mg/dL
Leukocytes,Ua: NEGATIVE
Nitrite: NEGATIVE
Protein, ur: NEGATIVE mg/dL
Specific Gravity, Urine: 1.013 (ref 1.005–1.030)
pH: 5 (ref 5.0–8.0)

## 2019-11-21 LAB — PROTIME-INR
INR: 2 — ABNORMAL HIGH (ref 0.8–1.2)
Prothrombin Time: 22.9 seconds — ABNORMAL HIGH (ref 11.4–15.2)

## 2019-11-21 LAB — TYPE AND SCREEN
ABO/RH(D): O POS
Antibody Screen: NEGATIVE

## 2019-11-21 LAB — SURGICAL PCR SCREEN
MRSA, PCR: NEGATIVE
Staphylococcus aureus: POSITIVE — AB

## 2019-11-21 LAB — APTT: aPTT: 44 seconds — ABNORMAL HIGH (ref 24–36)

## 2019-11-22 ENCOUNTER — Other Ambulatory Visit
Admission: RE | Admit: 2019-11-22 | Discharge: 2019-11-22 | Disposition: A | Discharge status: Home / Self Care | Payer: Medicare Other | Source: Ambulatory Visit | Attending: Surgery | Admitting: Surgery

## 2019-11-22 ENCOUNTER — Other Ambulatory Visit: Payer: Self-pay

## 2019-11-22 DIAGNOSIS — Z01818 Encounter for other preprocedural examination: Secondary | ICD-10-CM | POA: Diagnosis not present

## 2019-11-22 LAB — URINE CULTURE: Culture: NO GROWTH

## 2019-11-23 LAB — SARS CORONAVIRUS 2 (TAT 6-24 HRS): SARS Coronavirus 2: NEGATIVE

## 2019-11-26 ENCOUNTER — Ambulatory Visit: Payer: Medicare Other

## 2019-11-26 ENCOUNTER — Ambulatory Visit
Admission: RE | Admit: 2019-11-26 | Discharge: 2019-11-26 | Disposition: A | Discharge status: Home / Self Care | Payer: Medicare Other | Source: Ambulatory Visit | Attending: Surgery | Admitting: Surgery

## 2019-11-26 ENCOUNTER — Encounter: Payer: Self-pay | Admitting: Surgery

## 2019-11-26 ENCOUNTER — Ambulatory Visit: Payer: Medicare Other | Admitting: Anesthesiology

## 2019-11-26 ENCOUNTER — Other Ambulatory Visit: Payer: Self-pay

## 2019-11-26 ENCOUNTER — Encounter
Admission: RE | Disposition: A | Discharge status: Home / Self Care | Payer: Self-pay | Source: Ambulatory Visit | Attending: Surgery

## 2019-11-26 DIAGNOSIS — Z87891 Personal history of nicotine dependence: Secondary | ICD-10-CM | POA: Diagnosis not present

## 2019-11-26 DIAGNOSIS — N183 Chronic kidney disease, stage 3 unspecified: Secondary | ICD-10-CM | POA: Diagnosis not present

## 2019-11-26 DIAGNOSIS — I714 Abdominal aortic aneurysm, without rupture: Secondary | ICD-10-CM | POA: Diagnosis not present

## 2019-11-26 DIAGNOSIS — X58XXXA Exposure to other specified factors, initial encounter: Secondary | ICD-10-CM | POA: Insufficient documentation

## 2019-11-26 DIAGNOSIS — Z96611 Presence of right artificial shoulder joint: Secondary | ICD-10-CM

## 2019-11-26 DIAGNOSIS — Z7901 Long term (current) use of anticoagulants: Secondary | ICD-10-CM | POA: Insufficient documentation

## 2019-11-26 DIAGNOSIS — Z95820 Peripheral vascular angioplasty status with implants and grafts: Secondary | ICD-10-CM | POA: Diagnosis not present

## 2019-11-26 DIAGNOSIS — Z7982 Long term (current) use of aspirin: Secondary | ICD-10-CM | POA: Insufficient documentation

## 2019-11-26 DIAGNOSIS — I739 Peripheral vascular disease, unspecified: Secondary | ICD-10-CM | POA: Insufficient documentation

## 2019-11-26 DIAGNOSIS — I129 Hypertensive chronic kidney disease with stage 1 through stage 4 chronic kidney disease, or unspecified chronic kidney disease: Secondary | ICD-10-CM | POA: Diagnosis not present

## 2019-11-26 DIAGNOSIS — S46011A Strain of muscle(s) and tendon(s) of the rotator cuff of right shoulder, initial encounter: Secondary | ICD-10-CM | POA: Insufficient documentation

## 2019-11-26 DIAGNOSIS — Z79899 Other long term (current) drug therapy: Secondary | ICD-10-CM | POA: Diagnosis not present

## 2019-11-26 DIAGNOSIS — E785 Hyperlipidemia, unspecified: Secondary | ICD-10-CM | POA: Diagnosis not present

## 2019-11-26 DIAGNOSIS — J449 Chronic obstructive pulmonary disease, unspecified: Secondary | ICD-10-CM | POA: Insufficient documentation

## 2019-11-26 DIAGNOSIS — Z419 Encounter for procedure for purposes other than remedying health state, unspecified: Secondary | ICD-10-CM

## 2019-11-26 DIAGNOSIS — M19011 Primary osteoarthritis, right shoulder: Secondary | ICD-10-CM | POA: Insufficient documentation

## 2019-11-26 DIAGNOSIS — K219 Gastro-esophageal reflux disease without esophagitis: Secondary | ICD-10-CM | POA: Insufficient documentation

## 2019-11-26 HISTORY — PX: REVERSE SHOULDER ARTHROPLASTY: SHX5054

## 2019-11-26 LAB — ABO/RH: ABO/RH(D): O POS

## 2019-11-26 SURGERY — ARTHROPLASTY, SHOULDER, TOTAL, REVERSE
Anesthesia: General | Site: Shoulder | Laterality: Right

## 2019-11-26 MED ORDER — PROPOFOL 10 MG/ML IV BOLUS
INTRAVENOUS | Status: DC | PRN
  Administered 2019-11-26: 150 mg via INTRAVENOUS
  Administered 2019-11-26: 30 mg via INTRAVENOUS

## 2019-11-26 MED ORDER — SUGAMMADEX SODIUM 200 MG/2ML IV SOLN
INTRAVENOUS | Status: DC | PRN
  Administered 2019-11-26: 200 mg via INTRAVENOUS

## 2019-11-26 MED ORDER — ONDANSETRON HCL 4 MG/2ML IJ SOLN: 4.0000 mg | Freq: Four times a day (QID) | INTRAMUSCULAR | Status: DC | PRN

## 2019-11-26 MED ORDER — TRAMADOL HCL 50 MG PO TABS: 50.0000 mg | ORAL_TABLET | Freq: Four times a day (QID) | ORAL | Status: DC | PRN

## 2019-11-26 MED ORDER — DEXAMETHASONE SODIUM PHOSPHATE 10 MG/ML IJ SOLN
INTRAMUSCULAR | Status: DC | PRN
  Administered 2019-11-26: 10 mg via INTRAVENOUS

## 2019-11-26 MED ORDER — OXYCODONE HCL 5 MG PO TABS: 5.0000 mg | ORAL_TABLET | ORAL | 0 refills | Status: DC | PRN

## 2019-11-26 MED ORDER — ONDANSETRON HCL 4 MG/2ML IJ SOLN: 4.0000 mg | Freq: Once | INTRAMUSCULAR | Status: DC | PRN

## 2019-11-26 MED ORDER — OXYCODONE HCL 5 MG PO TABS
5.0000 mg | ORAL_TABLET | ORAL | Status: DC | PRN
  Filled 2019-11-26: qty 2

## 2019-11-26 MED ORDER — BUPIVACAINE LIPOSOME 1.3 % IJ SUSP
INTRAMUSCULAR | Status: DC | PRN
  Administered 2019-11-26: 20 mg via PERINEURAL

## 2019-11-26 MED ORDER — SODIUM CHLORIDE FLUSH 0.9 % IV SOLN
INTRAVENOUS | Status: AC
  Filled 2019-11-26: qty 40

## 2019-11-26 MED ORDER — SODIUM CHLORIDE 0.9 % IV SOLN
INTRAVENOUS | Status: DC | PRN
  Administered 2019-11-26: 10:00:00 50 ug/min via INTRAVENOUS

## 2019-11-26 MED ORDER — BUPIVACAINE LIPOSOME 1.3 % IJ SUSP
INTRAMUSCULAR | Status: AC
  Filled 2019-11-26: qty 20

## 2019-11-26 MED ORDER — EPINEPHRINE PF 1 MG/ML IJ SOLN
INTRAMUSCULAR | Status: AC
  Filled 2019-11-26: qty 1

## 2019-11-26 MED ORDER — BUPIVACAINE HCL (PF) 0.5 % IJ SOLN
INTRAMUSCULAR | Status: AC
  Filled 2019-11-26: qty 10

## 2019-11-26 MED ORDER — OXYCODONE HCL 5 MG PO TABS
10.0000 mg | ORAL_TABLET | ORAL | Status: DC | PRN
  Filled 2019-11-26: qty 3

## 2019-11-26 MED ORDER — CEFAZOLIN SODIUM-DEXTROSE 2-4 GM/100ML-% IV SOLN: 2.0000 g | Freq: Four times a day (QID) | INTRAVENOUS | Status: DC

## 2019-11-26 MED ORDER — LIDOCAINE HCL (PF) 1 % IJ SOLN
INTRAMUSCULAR | Status: AC
  Filled 2019-11-26: qty 5

## 2019-11-26 MED ORDER — PROPOFOL 10 MG/ML IV BOLUS
INTRAVENOUS | Status: AC
  Filled 2019-11-26: qty 20

## 2019-11-26 MED ORDER — CEFAZOLIN SODIUM-DEXTROSE 2-4 GM/100ML-% IV SOLN
INTRAVENOUS | Status: AC
  Filled 2019-11-26: qty 100

## 2019-11-26 MED ORDER — ONDANSETRON HCL 4 MG/2ML IJ SOLN
INTRAMUSCULAR | Status: DC | PRN
  Administered 2019-11-26: 4 mg via INTRAVENOUS

## 2019-11-26 MED ORDER — EPHEDRINE SULFATE 50 MG/ML IJ SOLN
INTRAMUSCULAR | Status: DC | PRN
  Administered 2019-11-26: 5 mg via INTRAVENOUS

## 2019-11-26 MED ORDER — CHLORHEXIDINE GLUCONATE 4 % EX LIQD
60.0000 mL | Freq: Once | CUTANEOUS | Status: AC
  Administered 2019-11-26: 4 via TOPICAL

## 2019-11-26 MED ORDER — BUPIVACAINE HCL (PF) 0.5 % IJ SOLN
INTRAMUSCULAR | Status: DC | PRN
  Administered 2019-11-26: 10 mg via PERINEURAL

## 2019-11-26 MED ORDER — POTASSIUM CHLORIDE IN NACL 20-0.9 MEQ/L-% IV SOLN: INTRAVENOUS | Status: DC

## 2019-11-26 MED ORDER — CEFAZOLIN SODIUM-DEXTROSE 2-4 GM/100ML-% IV SOLN
2.0000 g | INTRAVENOUS | Status: AC
  Administered 2019-11-26: 10:00:00 2 g via INTRAVENOUS

## 2019-11-26 MED ORDER — LACTATED RINGERS IV SOLN: INTRAVENOUS | Status: DC

## 2019-11-26 MED ORDER — ONDANSETRON HCL 4 MG PO TABS: 4.0000 mg | ORAL_TABLET | Freq: Four times a day (QID) | ORAL | Status: DC | PRN

## 2019-11-26 MED ORDER — MIDAZOLAM HCL 2 MG/2ML IJ SOLN
INTRAMUSCULAR | Status: AC
  Administered 2019-11-26: 1 mg via INTRAVENOUS
  Filled 2019-11-26: qty 2

## 2019-11-26 MED ORDER — METOCLOPRAMIDE HCL 5 MG/ML IJ SOLN: 5.0000 mg | Freq: Three times a day (TID) | INTRAMUSCULAR | Status: DC | PRN

## 2019-11-26 MED ORDER — TRANEXAMIC ACID 1000 MG/10ML IV SOLN
INTRAVENOUS | Status: AC
  Filled 2019-11-26: qty 10

## 2019-11-26 MED ORDER — FENTANYL CITRATE (PF) 100 MCG/2ML IJ SOLN: 50.0000 ug | INTRAMUSCULAR | Status: DC | PRN

## 2019-11-26 MED ORDER — FAMOTIDINE 20 MG PO TABS
ORAL_TABLET | ORAL | Status: AC
  Administered 2019-11-26: 20 mg via ORAL
  Filled 2019-11-26: qty 1

## 2019-11-26 MED ORDER — LIDOCAINE HCL (PF) 1 % IJ SOLN
INTRAMUSCULAR | Status: DC | PRN
  Administered 2019-11-26: .8 mg via SUBCUTANEOUS

## 2019-11-26 MED ORDER — FAMOTIDINE 20 MG PO TABS: 20.0000 mg | ORAL_TABLET | Freq: Once | ORAL | Status: AC

## 2019-11-26 MED ORDER — ROCURONIUM BROMIDE 100 MG/10ML IV SOLN
INTRAVENOUS | Status: DC | PRN
  Administered 2019-11-26: 50 mg via INTRAVENOUS

## 2019-11-26 MED ORDER — GLYCOPYRROLATE 0.2 MG/ML IJ SOLN
INTRAMUSCULAR | Status: DC | PRN
  Administered 2019-11-26: .2 mg via INTRAVENOUS

## 2019-11-26 MED ORDER — FENTANYL CITRATE (PF) 100 MCG/2ML IJ SOLN
25.0000 ug | INTRAMUSCULAR | Status: DC | PRN
  Administered 2019-11-26: 25 ug via INTRAVENOUS

## 2019-11-26 MED ORDER — FENTANYL CITRATE (PF) 100 MCG/2ML IJ SOLN
INTRAMUSCULAR | Status: AC
  Administered 2019-11-26: 25 ug via INTRAVENOUS
  Filled 2019-11-26: qty 2

## 2019-11-26 MED ORDER — ACETAMINOPHEN 500 MG PO TABS: 1000.0000 mg | ORAL_TABLET | Freq: Four times a day (QID) | ORAL | Status: DC

## 2019-11-26 MED ORDER — MIDAZOLAM HCL 2 MG/2ML IJ SOLN: 1.0000 mg | INTRAMUSCULAR | Status: DC | PRN

## 2019-11-26 MED ORDER — PHENYLEPHRINE HCL (PRESSORS) 10 MG/ML IV SOLN
INTRAVENOUS | Status: DC | PRN
  Administered 2019-11-26: 200 ug via INTRAVENOUS
  Administered 2019-11-26 (×2): 100 ug via INTRAVENOUS
  Administered 2019-11-26: 200 ug via INTRAVENOUS
  Administered 2019-11-26: 100 ug via INTRAVENOUS

## 2019-11-26 MED ORDER — FENTANYL CITRATE (PF) 100 MCG/2ML IJ SOLN
INTRAMUSCULAR | Status: AC
  Administered 2019-11-26: 10:00:00 50 ug via INTRAVENOUS
  Filled 2019-11-26: qty 2

## 2019-11-26 MED ORDER — BUPIVACAINE-EPINEPHRINE (PF) 0.5% -1:200000 IJ SOLN
INTRAMUSCULAR | Status: DC | PRN
  Administered 2019-11-26: 30 mL

## 2019-11-26 MED ORDER — TRANEXAMIC ACID 1000 MG/10ML IV SOLN
INTRAVENOUS | Status: DC | PRN
  Administered 2019-11-26: 1000 mg via INTRAVENOUS

## 2019-11-26 MED ORDER — METOCLOPRAMIDE HCL 10 MG PO TABS: 5.0000 mg | ORAL_TABLET | Freq: Three times a day (TID) | ORAL | Status: DC | PRN

## 2019-11-26 MED ORDER — BUPIVACAINE HCL (PF) 0.5 % IJ SOLN
INTRAMUSCULAR | Status: AC
  Filled 2019-11-26: qty 30

## 2019-11-26 SURGICAL SUPPLY — 70 items
BASEPLATE GLENOSPHERE 25 (Plate) ×1 IMPLANT
BASEPLATE GLENOSPHERE 25MM (Plate) ×1 IMPLANT
BEARING HUMERAL SHLDER 36M STD (Shoulder) IMPLANT
BIT DRILL TWIST 2.7 (BIT) ×1 IMPLANT
BIT DRILL TWIST 2.7MM (BIT) ×1
BLADE SAW SAG 25X90X1.19 (BLADE) ×3 IMPLANT
CANISTER SUCT 1200ML W/VALVE (MISCELLANEOUS) ×3 IMPLANT
CANISTER SUCT 3000ML PPV (MISCELLANEOUS) ×6 IMPLANT
CHLORAPREP W/TINT 26 (MISCELLANEOUS) ×3 IMPLANT
COOLER POLAR GLACIER W/PUMP (MISCELLANEOUS) ×3 IMPLANT
COVER BACK TABLE REUSABLE LG (DRAPES) ×3 IMPLANT
COVER WAND RF STERILE (DRAPES) ×3 IMPLANT
CRADLE LAMINECT ARM (MISCELLANEOUS) ×3 IMPLANT
DRAPE 3/4 80X56 (DRAPES) ×6 IMPLANT
DRAPE IMP U-DRAPE 54X76 (DRAPES) ×6 IMPLANT
DRAPE INCISE IOBAN 66X45 STRL (DRAPES) ×6 IMPLANT
DRSG OPSITE POSTOP 4X8 (GAUZE/BANDAGES/DRESSINGS) ×3 IMPLANT
ELECT BLADE 6.5 EXT (BLADE) IMPLANT
ELECT CAUTERY BLADE 6.4 (BLADE) ×3 IMPLANT
GLENOID SPHERE STD STRL 36MM (Orthopedic Implant) ×2 IMPLANT
GLOVE BIO SURGEON STRL SZ7.5 (GLOVE) ×12 IMPLANT
GLOVE BIO SURGEON STRL SZ8 (GLOVE) ×12 IMPLANT
GLOVE BIOGEL PI IND STRL 8 (GLOVE) ×1 IMPLANT
GLOVE BIOGEL PI INDICATOR 8 (GLOVE) ×2
GLOVE INDICATOR 8.0 STRL GRN (GLOVE) ×3 IMPLANT
GOWN STRL REUS W/ TWL LRG LVL3 (GOWN DISPOSABLE) ×1 IMPLANT
GOWN STRL REUS W/ TWL XL LVL3 (GOWN DISPOSABLE) ×1 IMPLANT
GOWN STRL REUS W/TWL LRG LVL3 (GOWN DISPOSABLE) ×2
GOWN STRL REUS W/TWL XL LVL3 (GOWN DISPOSABLE) ×2
HOOD PEEL AWAY FLYTE STAYCOOL (MISCELLANEOUS) ×11 IMPLANT
ILLUMINATOR WAVEGUIDE N/F (MISCELLANEOUS) ×3 IMPLANT
KIT STABILIZATION SHOULDER (MISCELLANEOUS) ×3 IMPLANT
KIT TURNOVER KIT A (KITS) ×3 IMPLANT
MASK FACE SPIDER DISP (MASK) ×3 IMPLANT
MAT ABSORB  FLUID 56X50 GRAY (MISCELLANEOUS) ×2
MAT ABSORB FLUID 56X50 GRAY (MISCELLANEOUS) ×1 IMPLANT
NDL FRENCH SPRG EYE 1/2 CRC (NEEDLE) ×2 IMPLANT
NDL SAFETY ECLIPSE 18X1.5 (NEEDLE) ×1 IMPLANT
NDL SPNL 20GX3.5 QUINCKE YW (NEEDLE) ×1 IMPLANT
NEEDLE HYPO 18GX1.5 SHARP (NEEDLE) ×2
NEEDLE HYPO 22GX1.5 SAFETY (NEEDLE) ×3 IMPLANT
NEEDLE SPNL 20GX3.5 QUINCKE YW (NEEDLE) ×3 IMPLANT
NS IRRIG 500ML POUR BTL (IV SOLUTION) ×3 IMPLANT
PACK ARTHROSCOPY SHOULDER (MISCELLANEOUS) ×3 IMPLANT
PAD WRAPON POLAR SHDR UNIV (MISCELLANEOUS) ×1 IMPLANT
PENCIL SMOKE EVACUATOR (MISCELLANEOUS) ×1 IMPLANT
PIN THREADED REVERSE (PIN) ×2 IMPLANT
PULSAVAC PLUS IRRIG FAN TIP (DISPOSABLE) ×3
SCREW BONE LOCKING 4.75X30X3.5 (Screw) ×2 IMPLANT
SCREW LOCKING 4.75MMX15MM (Screw) ×2 IMPLANT
SCREW LOCKING NS 4.75MMX20MM (Screw) ×2 IMPLANT
SCREW LOCKING STRL 4.75X25X3.5 (Screw) ×4 IMPLANT
SHOULDER HUMERAL BEAR 36M STD (Shoulder) ×3 IMPLANT
SLING ULTRA II M (MISCELLANEOUS) ×1 IMPLANT
SOL .9 NS 3000ML IRR  AL (IV SOLUTION) ×2
SOL .9 NS 3000ML IRR UROMATIC (IV SOLUTION) ×1 IMPLANT
SPONGE LAP 18X18 RF (DISPOSABLE) ×3 IMPLANT
STAPLER SKIN PROX 35W (STAPLE) ×3 IMPLANT
STEM HUMERAL STRL 13MMX55MM (Stem) ×2 IMPLANT
SUT ETHIBOND 0 MO6 C/R (SUTURE) ×3 IMPLANT
SUT FIBERWIRE #2 38 BLUE 1/2 (SUTURE) ×12
SUT VIC AB 0 CT1 36 (SUTURE) ×3 IMPLANT
SUT VIC AB 2-0 CT1 27 (SUTURE) ×4
SUT VIC AB 2-0 CT1 TAPERPNT 27 (SUTURE) ×2 IMPLANT
SUTURE FIBERWR #2 38 BLUE 1/2 (SUTURE) ×4 IMPLANT
SYR 10ML LL (SYRINGE) ×3 IMPLANT
SYR 30ML LL (SYRINGE) IMPLANT
TIP FAN IRRIG PULSAVAC PLUS (DISPOSABLE) ×1 IMPLANT
TRAY HUM MINI SHOULDER +0 40D (Shoulder) ×2 IMPLANT
WRAPON POLAR PAD SHDR UNIV (MISCELLANEOUS) ×3

## 2019-11-26 NOTE — H&P (Signed)
Paper H&P to be scanned into permanent record. H&P reviewed and patient re-examined. No changes. 

## 2019-11-26 NOTE — Op Note (Signed)
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  Post-Op Diagnosis:   Same  Procedure:   Reverse right total shoulder arthroplasty.  Surgeon:   Pascal Lux, MD  Assistant:   Cameron Proud, PA-C; Cherlynn June, PA-S  Anesthesia:   General endotracheal with an interscalene block using Exparel placed preoperatively by the anesthesiologist.  Findings:   As above.  Complications:   None  EBL:    150 cc  Fluids:   1500 cc crystalloid  UOP:   None  TT:   None  Drains:   None  Closure:   Staples  Implants:   All press-fit Bi[employer] Comprehensive system with a #13 micro-humeral stem, a 40 mm humeral tray with a standard insert, and a mini-base plate with a 36 mm glenosphere.  Brief Clinical Note:   The patient is an 84 year old male with a history of progressive worsening pain and weakness of his right shoulder following an injury about 6 months ago where he fell onto his right shoulder. Since then, he has been unable to raise his arm even to shoulder level and complains of pain with activities as well as at night. His symptoms have persisted despite medications, activity modification, injections, etc. His history and examination are consistent with a massive irreparable rotator cuff tear with early cuff arthropathy, all of which were confirmed by MRI scan. The patient presents at this time for a reverse right total shoulder arthroplasty.  Procedure:   The patient underwent placement of an interscalene block using Exparel by the anesthesiologist in the preoperative holding area before being brought into the operating room and lain in the supine position. The patient then underwent general endotracheal intubation and anesthesia before the patient was repositioned in the beach chair position using the beach chair positioner. The right shoulder and upper extremity were  prepped with ChloraPrep solution before being draped sterilely. Preoperative antibiotics were administered.   A standard anterior approach to the shoulder was made through an approximately 4-5 inch incision. The incision was carried down through the subcutaneous tissues to expose the deltopectoral fascia. The interval between the deltoid and pectoralis muscles was identified and this plane developed, retracting the cephalic vein laterally with the deltoid muscle. The conjoined tendon was identified. Its lateral margin was dissected and the Kolbel self-retraining retractor inserted. The "three sisters" were identified and cauterized. Bursal tissues were removed to improve visualization. The subscapularis tendon was noted to be chronically torn and retracted medially. The underlying capsular and scar tissue was released from the lesser tuberosity and several tagging sutures placed. The inferior capsule was released with care after identifying and protecting the axillary nerve. The proximal humeral cut was made at approximately 25 of retroversion using the extra-medullary guide.   Attention was redirected to the glenoid. The labrum was debrided circumferentially before the center of the glenoid was marked with electrocautery. The guidewire was drilled into the glenoid neck using the appropriate guide. After verifying its position, it was overreamed with the mini-baseplate reamer to create a flat surface. The permanent mini-baseplate was impacted into place. It was stabilized with a 25 x 6.5 mm central screw and four peripheral screws. Locking screws were placed superiorly and inferiorly while nonlocking screws were placed anteriorly and posteriorly. The permanent 36 mm glenosphere was then impacted into place and its Morse taper locking mechanism verified using manual distraction.  Attention was directed  to the humeral side. The humeral canal was reamed sequentially beginning with the end-cutting reamer then  progressing from a 4 mm reamer up to a 13 mm reamer. This provided excellent circumferential chatter. The canal was broached beginning with a #10 broach and progressing to a #13 broach. This was left in place and a trial reduction performed using the standard trial humeral platform. The arm demonstrated excellent range of motion as the hand could be brought across the chest to the opposite shoulder and brought to the top of the patient's head and to the patient's ear. The shoulder appeared stable throughout this range of motion. The joint was dislocated and the trial components removed. The permanent #13 micro-stem was impacted into place with care taken to maintain the appropriate version. The permanent 40 mm humeral platform with the standard insert was put together on the back table and impacted into place. Again, the [facility] taper locking mechanism was verified using manual distraction. The shoulder was relocated using two finger pressure and again placed through a range of motion with the findings as described above.  The wound was copiously irrigated with sterile saline solution using the jet lavage system before a total of 30 cc of 0.5% Sensorcaine with epinephrine was injected into the pericapsular and peri-incisional tissues to help with postoperative analgesia. The anterior scar and capsular tissues were reapproximated to the lesser tuberosity using #2 FiberWire interrupted sutures. The deltopectoral interval was closed using #0 Vicryl interrupted sutures before the subcutaneous tissues were closed using 2-0 Vicryl interrupted sutures. The skin was closed using staples. Prior to closing the skin, 1 g of transexemic acid in 10 cc of normal saline was injected intra-articularly to help with postoperative bleeding. A sterile occlusive dressing was applied to the wound before the arm was placed into a shoulder immobilizer with an abduction pillow. A Polar Care system also was applied to the shoulder. The  patient was then transferred back to a hospital bed before being awakened, extubated, and returned to the recovery room in satisfactory condition after tolerating the procedure well.

## 2019-11-26 NOTE — Anesthesia Preprocedure Evaluation (Addendum)
Anesthesia Evaluation  Patient identified by MRN, date of birth, ID band Patient awake    Reviewed: Allergy & Precautions, NPO status , Patient's Chart, lab work & pertinent test results  History of Anesthesia Complications Negative for: history of anesthetic complications  Airway Mallampati: III       Dental   Pulmonary neg sleep apnea, neg COPD, Not current smoker, former smoker,           Cardiovascular hypertension, Pt. on medications + Peripheral Vascular Disease  (-) Past MI and (-) CHF (-) dysrhythmias (-) Valvular Problems/Murmurs     Neuro/Psych neg Seizures    GI/Hepatic Neg liver ROS, hiatal hernia, GERD  Medicated,  Endo/Other  neg diabetes  Renal/GU Renal InsufficiencyRenal disease     Musculoskeletal   Abdominal   Peds  Hematology   Anesthesia Other Findings   Reproductive/Obstetrics                           Anesthesia Physical Anesthesia Plan  ASA: III  Anesthesia Plan: General   Post-op Pain Management:  Regional for Post-op pain   Induction: Intravenous  PONV Risk Score and Plan: 2 and Ondansetron and Dexamethasone  Airway Management Planned: Oral ETT  Additional Equipment:   Intra-op Plan:   Post-operative Plan:   Informed Consent: I have reviewed the patients History and Physical, chart, labs and discussed the procedure including the risks, benefits and alternatives for the proposed anesthesia with the patient or authorized representative who has indicated his/her understanding and acceptance.       Plan Discussed with:   Anesthesia Plan Comments:         Anesthesia Quick Evaluation

## 2019-11-26 NOTE — Anesthesia Procedure Notes (Signed)
Procedure Name: Intubation Performed by: Jaydy Fitzhenry L, CRNA Pre-anesthesia Checklist: Patient identified, Patient being monitored, Timeout performed, Emergency Drugs available and Suction available Patient Re-evaluated:Patient Re-evaluated prior to induction Oxygen Delivery Method: Circle system utilized Preoxygenation: Pre-oxygenation with 100% oxygen Induction Type: IV induction Ventilation: Mask ventilation without difficulty Laryngoscope Size: Mac and 3 Grade View: Grade I Tube type: Oral Tube size: 7.0 mm Number of attempts: 1 Airway Equipment and Method: Stylet Placement Confirmation: ETT inserted through vocal cords under direct vision,  positive ETCO2 and breath sounds checked- equal and bilateral Secured at: 21 cm Tube secured with: Tape Dental Injury: Teeth and Oropharynx as per pre-operative assessment        

## 2019-11-26 NOTE — Progress Notes (Addendum)
PT in postop area, Dressing to right  shoulder cdi with honeycomb, polar care in place, able to palpate radial pulse, pt able to wiggle fingers. Spoke with pts wife and [facility] will be there to do PT with him in the AM

## 2019-11-26 NOTE — Transfer of Care (Signed)
Immediate Anesthesia Transfer of Care Note  [name]  Procedure(s) Performed: REVERSE SHOULDER ARTHROPLASTY (Right Shoulder)  Patient Location: PACU  Anesthesia Type:General  Level of Consciousness: awake, alert  and oriented  Airway & Oxygen Therapy: Patient Spontanous Breathing  Post-op Assessment: Report given to RN and Post -op Vital signs reviewed and stable  Post vital signs: Reviewed and stable  Last Vitals:  Vitals Value Taken Time  BP 113/44 11/26/19 1253  Temp    Pulse 81 11/26/19 1254  Resp 12 11/26/19 1254  SpO2 95 % 11/26/19 1254  Vitals shown include unvalidated device data.  Last Pain:  Vitals:   11/26/19 0856  TempSrc: Temporal  PainSc: 2          Complications: No apparent anesthesia complications

## 2019-11-26 NOTE — Evaluation (Signed)
Physical Therapy Evaluation Patient Details Name: Bryan Welch MRN: [id number] DOB: [birth year] Today's Date: 11/26/2019   History of Present Illness  seen in peri-op area s/p R reverse TSA, NWB (11/26/19).  Clinical Impression  Upon evaluation, patient alert and oriented; follows commands and agreeable to session.  Eager for upcoming discharge home.  R UE immobilized in sling; adjusted for appropriate position and support during session. Pain well-controlled, 4/10; unchanged throughout session.  Able to wiggle fingers, squeeze ball with R hand; reports returning sensation to hand/fingers.  Able to complete sit/stand, basic transfers and gait (60') without assist device, cga/min assist; slightly inconsistent stepping pattern with increased sway in all planes.  All mobility improved to cga/close sup with use of SPC, with noted improvement in fluidity, symmetry and stability.  Able to complete stairs with single rail, cga; fair/good LE strength/stability noted.  Do recommend continued use of SPC with gait efforts for optimal safety/stability.  Patient in agreement (and has SPC at home).  Reviewed use of polar care, donning/doffing/wear schedule of sling, hemi-dressing techniques, no passive/active ROM to elbow/shoulder (until cleared by physician), car transfer techniques with patient; voiced understanding and agreement with all information.  No additional questions/concerns at this time. Would benefit from skilled PT to address above deficits and promote optimal return to PLOF.; Recommend transition to HHPT upon discharge from acute hospitalization.     Follow Up Recommendations Home health PT    Equipment Recommendations  (has SPC (plans to use) at home)    Recommendations for Other Services       Precautions / Restrictions Precautions Precautions: Fall Restrictions Weight Bearing Restrictions: No      Mobility  Bed Mobility               General bed mobility comments: seated  in recliner beginning/end of treatment session  Transfers Overall transfer level: Needs assistance Equipment used: None Transfers: Sit to/from Stand Sit to Stand: Min guard         General transfer comment: slightly increased BOS, does require L UE support for lift off and overall balance/stabilization  Ambulation/Gait Ambulation/Gait assistance: Min guard Gait Distance (Feet): 60 Feet Assistive device: None       General Gait Details: reciprocal stepping pattern, mild inconsistency in step height/length; mild sway in all planes, intermittent cga/min assist for balance/safety  Stairs Stairs: Yes Stairs assistance: Min guard Stair Management: One rail Right;One rail Left Number of Stairs: 4 General stair comments: step to gait pattern, fair/good stability bilat LEs  Wheelchair Mobility    Modified Rankin (Stroke Patients Only)       Balance Overall balance assessment: Needs assistance Sitting-balance support: No upper extremity supported;Feet supported Sitting balance-Leahy Scale: Good     Standing balance support: No upper extremity supported Standing balance-Leahy Scale: Fair                               Pertinent Vitals/Pain Pain Assessment: 0-10 Pain Score: 5  Pain Location: R shoulder Pain Descriptors / Indicators: Aching;Guarding;Grimacing Pain Intervention(s): Limited activity within patient's tolerance;Monitored during session;Premedicated before session;Repositioned    Home Living Family/patient expects to be discharged to:: Private residence Living Arrangements: Spouse/significant other Available Help at Discharge: Family Type of Home: House Home Access: Stairs to enter Entrance Stairs-Rails: None Entrance Stairs-Number of Steps: 2 Home Layout: One level Home Equipment: None      Prior Function Level of Independence: Independent  Comments: Indep with ADLs, household and community mobilization without assist device; +  driving.  Active, enjoys walking 1-2 miles/day.  Does endorse single fall in previous six months (resulting in R shoulder injury).     Hand Dominance   Dominant Hand: Right    Extremity/Trunk Assessment   Upper Extremity Assessment Upper Extremity Assessment: (R UE immobilized; able to wiggle fingers, squeeze ball)    Lower Extremity Assessment Lower Extremity Assessment: (grossly 4 to 4+/5 throughout bilat)       Communication   Communication: No difficulties  Cognition Arousal/Alertness: Awake/alert Behavior During Therapy: WFL for tasks assessed/performed Overall Cognitive Status: Within Functional Limits for tasks assessed                                        General Comments      Exercises Other Exercises Other Exercises: 84' with SPC, cga/close sup-min cuing for position and sequencing of SPC; decreased cadence, but improved safety/stability without buckling or LOB.  Does endorse improved confidence with use of SPC; recommend continued use upon discharge.  Patient voiced understanding. Other Exercises: Adjusted sling for appropriate position and support to R UE; reviewed donning/doffing and wearing instructions. Other Exercises: Reviewed hemi-dressing techniques, polar care use, no active/passive ROM to R UE until cleared by physician, car transfer techniques with patient. Other Exercises: Patient voiced understanding of all information; no additional questions at this time.  Encouraged additional questions/concerns to [facility] therapist after return home.   Assessment/Plan    PT Assessment Patient needs continued PT services  PT Problem List Decreased strength;Decreased activity tolerance;Decreased balance;Decreased range of motion;Decreased mobility;Decreased coordination;Decreased cognition;Decreased knowledge of use of DME;Decreased safety awareness;Decreased knowledge of precautions;Impaired sensation       PT Treatment Interventions DME  instruction;Gait training;Stair training;Functional mobility training;Therapeutic activities;Therapeutic exercise;Balance training;Patient/family education    PT Goals (Current goals can be found in the Care Plan section)  Acute Rehab PT Goals Patient Stated Goal: to return home with wife PT Goal Formulation: With patient Time For Goal Achievement: 12/10/19 Potential to Achieve Goals: Good    Frequency 7X/week   Barriers to discharge        Co-evaluation               AM-PAC PT "6 Clicks" Mobility  Outcome Measure Help needed turning from your back to your side while in a flat bed without using bedrails?: None Help needed moving from lying on your back to sitting on the side of a flat bed without using bedrails?: None Help needed moving to and from a bed to a chair (including a wheelchair)?: A Little Help needed standing up from a chair using your arms (e.g., wheelchair or bedside chair)?: A Little Help needed to walk in hospital room?: A Little Help needed climbing 3-5 steps with a railing? : A Little 6 Click Score: 20    End of Session Equipment Utilized During Treatment: Gait belt Activity Tolerance: Patient tolerated treatment well Patient left: in chair;with call bell/phone within reach;with nursing/sitter in room Nurse Communication: Mobility status PT Visit Diagnosis: Difficulty in walking, not elsewhere classified (R26.2);Other symptoms and signs involving the nervous system (R29.898)    Time: 9323-5573 PT Time Calculation (min) (ACUTE ONLY): 33 min   Charges:   PT Evaluation $PT Eval Moderate Complexity: 1 Mod PT Treatments $Gait Training: 8-22 mins $Therapeutic Activity: 8-22 mins  Nikko Goldwire H. Manson Passey, PT, DPT, NCS 11/26/19, 4:00 PM [phone]

## 2019-11-26 NOTE — Progress Notes (Signed)
Pt s wife given instructions for polar care and after care, vebalizes understanding

## 2019-11-26 NOTE — Anesthesia Procedure Notes (Signed)
Anesthesia Regional Block: Interscalene brachial plexus block   Pre-Anesthetic Checklist: ,, timeout performed, Correct Patient, Correct Site, Correct Laterality, Correct Procedure, Correct Position, site marked, Risks and benefits discussed,  Surgical consent,  Pre-op evaluation,  At surgeon's request and post-op pain management  Laterality: Right  Prep: chloraprep       Needles:  Injection technique: Single-shot  Needle Type: Echogenic Stimulator Needle     Needle Length: 9cm  Needle Gauge: 21     Additional Needles:   Procedures:, nerve stimulator,,, ultrasound used (permanent image in chart),,,,   Nerve Stimulator or Paresthesia:  Response: biceps flexion, 0.8 mA,   Additional Responses:   Narrative:  Start time: 11/26/2019 9:44 AM End time: 11/26/2019 9:46 AM Injection made incrementally with aspirations every 5 mL.  Performed by: Personally   Additional Notes: Functioning IV was confirmed and monitors were applied.  A 46mm 22ga Stimuplex needle was used. Sterile prep and drape,hand hygiene and sterile gloves were used.  Negative aspiration and negative test dose prior to incremental administration of local anesthetic. The patient tolerated the procedure well.

## 2019-11-26 NOTE — Discharge Instructions (Addendum)
Interscalene Nerve Block with Exparel  1.  For your surgery you have received an Interscalene Nerve Block with Exparel. 2. Nerve Blocks affect many types of nerves, including nerves that control movement, pain and normal sensation.  You may experience feelings such as numbness, tingling, heaviness, weakness or the inability to move your arm or the feeling or sensation that your arm has "fallen asleep". 3. A nerve block with Exparel can last up to 5 days.  Usually the weakness wears off first.  The tingling and heaviness usually wear off next.  Finally you may start to notice pain.  Keep in mind that this may occur in any order.  Once a nerve block starts to wear off it is usually completely gone within 60 minutes. 4. ISNB may cause mild shortness of breath, a hoarse voice, blurry vision, unequal pupils, or drooping of the face on the same side as the nerve block.  These symptoms will usually resolve with the numbness.  Very rarely the procedure itself can cause mild seizures. 5. If needed, your surgeon will give you a prescription for pain medication.  It will take about 60 minutes for the oral pain medication to become fully effective.  So, it is recommended that you start taking this medication before the nerve block first begins to wear off, or when you first begin to feel discomfort. 6. Take your pain medication only as prescribed.  Pain medication can cause sedation and decrease your breathing if you take more than you need for the level of pain that you have. 7. Nausea is a common side effect of many pain medications.  You may want to eat something before taking your pain medicine to prevent nausea. 8. After an Interscalene nerve block, you cannot feel pain, pressure or extremes in temperature in the effected arm.  Because your arm is numb it is at an increased risk for injury.  To decrease the possibility of injury, please practice the following:  a. While you are awake change the position of  your arm frequently to prevent too much pressure on any one area for prolonged periods of time. b.  If you have a cast or tight dressing, check the color or your fingers every couple of hours.  Call your surgeon with the appearance of any discoloration (white or blue). c. If you are given a sling to wear before you go home, please wear it  at all times until the block has completely worn off.  Do not get up at night without your sling. d. Please contact [facility] or your surgeon if you do not begin to regain sensation after 7 days from the surgery.  Anesthesia may be contacted by calling the Same Day Surgery Department, Mon. through Fri., 6 am to 4 pm at [phone].   e. If you experience any other problems or concerns, please contact your surgeon's office. If you experience severe or prolonged shortness of breath go to the nearest emergency department.AMBULATORY SURGERY  DISCHARGE INSTRUCTIONS   1) The drugs that you were given will stay in your system until tomorrow so for the next 24 hours you should not:  A) Drive an automobile B) Make any legal decisions C) Drink any alcoholic beverage   2) You may resume regular meals tomorrow.  Today it is better to start with liquids and gradually work up to solid foods.  You may eat anything you prefer, but it is better to start with liquids, then soup and crackers, and gradually  work up to [employer].   3) Please notify your doctor immediately if you have any unusual bleeding, trouble breathing, redness and pain at the surgery site, drainage, fever, or pain not relieved by medication.    4) Additional Instructions:        Please contact your physician with any problems or Same Day Surgery at [phone], Monday through Friday 6 am to 4 pm, or [place] at [facility] number at [phone].Orthopedic discharge instructions: May shower with intact OpSite dressing.  Apply ice frequently to shoulder or use Polar Care  device. Resume Xarelto on Wednesday evening. Take oxycodone as prescribed when needed.  May supplement with ES Tylenol if necessary. Keep shoulder immobilizer on at all times except may remove for bathing purposes. Follow-up in 10-14 days or as scheduled.

## 2019-11-27 LAB — SURGICAL PATHOLOGY

## 2019-11-27 NOTE — Anesthesia Postprocedure Evaluation (Signed)
Anesthesia Post Note  [name]  Procedure(s) Performed: REVERSE SHOULDER ARTHROPLASTY (Right Shoulder)  Patient location during evaluation: PACU Anesthesia Type: General Level of consciousness: awake and alert Pain management: pain level controlled Vital Signs Assessment: post-procedure vital signs reviewed and stable Respiratory status: spontaneous breathing and respiratory function stable Cardiovascular status: stable Anesthetic complications: no     Last Vitals:  Vitals:   11/26/19 1415 11/26/19 1530  BP: (!) 128/48 (!) 129/50  Pulse: 70 68  Resp: 20 18  Temp: (!) 36.1 C   SpO2: 100% 100%    Last Pain:  Vitals:   11/27/19 0837  TempSrc:   PainSc: 0-No pain                 Akshar Starnes K

## 2020-03-13 ENCOUNTER — Ambulatory Visit (INDEPENDENT_AMBULATORY_CARE_PROVIDER_SITE_OTHER): Payer: Medicare Other | Admitting: Vascular Surgery

## 2020-03-13 ENCOUNTER — Encounter (INDEPENDENT_AMBULATORY_CARE_PROVIDER_SITE_OTHER): Payer: Self-pay | Admitting: Vascular Surgery

## 2020-03-13 ENCOUNTER — Ambulatory Visit (INDEPENDENT_AMBULATORY_CARE_PROVIDER_SITE_OTHER): Payer: Medicare Other

## 2020-03-13 ENCOUNTER — Other Ambulatory Visit: Payer: Self-pay

## 2020-03-13 VITALS — BP 155/64 | HR 81 | Resp 17 | Ht 66.0 in | Wt 139.0 lb

## 2020-03-13 DIAGNOSIS — I6523 Occlusion and stenosis of bilateral carotid arteries: Secondary | ICD-10-CM

## 2020-03-13 DIAGNOSIS — I739 Peripheral vascular disease, unspecified: Secondary | ICD-10-CM | POA: Diagnosis not present

## 2020-03-13 DIAGNOSIS — I714 Abdominal aortic aneurysm, without rupture, unspecified: Secondary | ICD-10-CM

## 2020-03-13 DIAGNOSIS — E785 Hyperlipidemia, unspecified: Secondary | ICD-10-CM | POA: Diagnosis not present

## 2020-03-13 DIAGNOSIS — I10 Essential (primary) hypertension: Secondary | ICD-10-CM | POA: Diagnosis not present

## 2020-03-13 NOTE — Assessment & Plan Note (Signed)
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  Previously this was 3.8 cm in maximal diameter 1 year ago.  This is a little more concerning and certainly will need to shorten his follow-up.  Is still below the size for threshold of repair and has not grown fast enough to deem it a rapid growth requiring treatment, but a short and follow-up of 6 months will be planned at this point.  If he continues to have growth and approaches 5 cm, we should likely then consider repair.

## 2020-03-13 NOTE — Patient Instructions (Signed)
Abdominal Aortic Aneurysm ° °An aneurysm is a bulge in an artery. It happens when blood pushes against a weakened or damaged artery wall. An abdominal aortic aneurysm (AAA) is an aneurysm that occurs in the lower part of the aorta, which is the main artery of the body. The aorta supplies blood from the heart to the rest of the body. °Some aneurysms may not cause symptoms. However, an AAA can cause two serious problems: °· It can enlarge and burst (rupture). °· It can cause blood to flow between the layers of the wall of the aorta through a tear (aortic dissection). °Both of these problems are medical emergencies. They can cause bleeding inside the body. If they are not diagnosed and treated right away, they can be life-threatening. °What are the causes? °The exact cause of this condition is not known. °What increases the risk? °The following factors may make you more likely to develop this condition: °· Being a male 60 years of age or older. °· Being Caucasian. °· Using tobacco or having a history of tobacco use. °· Having a family history of aneurysms. °· Having any of the following conditions: °? Hardening of the arteries (arteriosclerosis). °? Inflammation of the walls of an artery (arteritis). °? Certain genetic conditions. °? Obesity. °? An infection in the wall of the aorta (infectious aortitis) caused by bacteria. °? High cholesterol. °? High blood pressure (hypertension). °What are the signs or symptoms? °Symptoms of this condition vary depending on the size of the aneurysm and how fast it is growing. Most aneurysms grow slowly and do not cause any symptoms. When symptoms do occur, they may include: °· Pain in the abdomen, side, or lower back. °· Feeling full after eating only small amounts of food. °· Feeling a pulsating lump in the abdomen. °Symptoms that the aneurysm has ruptured include: °· A sudden onset of severe pain in the abdomen, side, or back. °· Nausea or vomiting. °· Feeling light-headed or  passing out. °How is this diagnosed? °This condition may be diagnosed with: °· A physical exam to check for throbbing and to listen to blood flow in your abdomen. °· Tests, such as: °? Ultrasound. °? X-rays. °? CT scan. °? MRI. °? Tests to check your arteries for damage or blockage (angiogram). °Because most unruptured AAAs cause no symptoms, they are often found during exams for other conditions. °How is this treated? °Treatment for this condition depends on: °· The size of the aneurysm. °· How fast the aneurysm is growing. °· Your age. °· Risk factors for rupture. °If your aneurysm is smaller than 2 inches (5 cm), your health care provider may manage it by: °· Checking (monitoring) it regularly to see if it is getting bigger. Depending on the size of the aneurysm, how fast it is growing, and your other risk factors, you may have an ultrasound to monitor it every 3-6 months, every year, or every few years. °· Giving you medicines to control blood pressure, treat pain, or fight infection. °If your aneurysm is larger than 2 inches (5 cm), your health care provider may do surgery to repair it. °Follow these instructions at home: °Lifestyle °· Do not use any products that contain nicotine or tobacco, such as cigarettes, e-cigarettes, and chewing tobacco. If you need help quitting, ask your health care provider. °· Stay physically active and exercise regularly. Talk with your health care provider about how often you should exercise and which types of exercise are safe for you. °Eating and drinking °·   Eat a heart-healthy diet. This includes plenty of fresh fruits and vegetables, whole grains, low-fat (lean) protein, and low-fat dairy products. °· Avoid foods that are high in saturated fat and cholesterol, such as red meat and some dairy products. °· Do not drink alcohol if: °? Your health care provider tells you not to drink. °? You are pregnant, may be pregnant, or are planning to become pregnant. °· If you drink  alcohol: °? Limit how much you use to: °§ 0-1 drink a day for women. °§ 0-2 drinks a day for men. °? Be aware of how much alcohol is in your drink. In the [place] one drink equals one typical bottle of beer (12 oz), one-half glass of wine (5 oz), or one shot of hard liquor (1½ oz). °General instructions °· Take over-the-counter and prescription medicines only as told by your health care provider. °· Keep your blood pressure within a normal range. Check it regularly, and ask your health care provider what your target blood pressure should be. °· Have your blood sugar (glucose) level and cholesterol levels checked regularly. Follow your health care provider's instructions on how to keep levels within normal limits. °· Avoid heavy lifting and activities that take a lot of effort (are strenuous). Ask your health care provider what activities are safe for you. °· Keep all follow-up visits as told by your health care provider. This is important. °Contact a health care provider if you: °· Have pain in your abdomen, side, or back. °· Have a throbbing feeling in your abdomen. °Get help right away if you: °· Have sudden, severe pain in your abdomen, side, or back. °· Experience nausea or vomiting. °· Have constipation or problems urinating. °· Feel light-headed. °· Have a rapid heart rate when you stand. °· Have sweaty, clammy skin. °· Have shortness of breath. °· Have a fever. °These symptoms may represent a serious problem that is an emergency. Do not wait to see if the symptoms will go away. Get medical help right away. Call your local emergency services (911 in the [place] Do not drive yourself to the hospital. °Summary °· An aneurysm is a bulge in an artery. It happens when blood pushes against a weakened or damaged artery wall. °· Being older, male, Caucasian, having a history of tobacco use, and a family history of aneurysms can increase the risk. °· These problems can cause bleeding inside the body and can be  life-threatening. Get medical help right away. °This information is not intended to replace advice given to you by your health care provider. Make sure you discuss any questions you have with your health care provider. °Document Revised: 02/11/2019 Document Reviewed: 06/02/2018 °Elsevier Patient Education © 2020 [employer] ° °

## 2020-03-13 NOTE — Assessment & Plan Note (Signed)
Duplex today shows this to be widely patent without recurrent stenosis.  He has stable moderately elevated velocities in the left common carotid artery with less than 40% stenosis in the left internal carotid artery.  Continue aspirin and statin agent.  Recheck annually.

## 2020-03-13 NOTE — Assessment & Plan Note (Signed)
ABIs today are normal at 0.94 on the right and 1.0 on the left after previous revascularization.  No disabling claudication or limb threatening symptoms.  Continue current medical regimen.  Recheck in 1 year

## 2020-03-13 NOTE — Progress Notes (Signed)
MRN : [id number]  Bryan Welch is a 84 y.o. (1936-08-15) male who presents with chief complaint of  Chief Complaint  Patient presents with  . Follow-up  .  History of Present Illness: Patient returns today in follow up of multiple vascular issues.  He is doing well today without any specific complaints.  He is about 6 years status post right carotid endarterectomy.  Duplex today shows this to be widely patent without recurrent stenosis.  He has stable moderately elevated velocities in the left common carotid artery with less than 40% stenosis in the left internal carotid artery.  No focal neurologic symptoms. Specifically, the patient denies amaurosis fugax, speech or swallowing difficulties, or arm or leg weakness or numbness.  He currently has no lifestyle limiting claudication, rest pain, or ulceration.  He has previously undergone revascularization procedures in the past. ABIs today are normal at 0.94 on the right and 1.0 on the left after previous revascularization. 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  Previously this was 3.8 cm in maximal diameter 1 year ago.  No aneurysm related symptoms. Specifically, the patient denies new back or abdominal pain, or signs of peripheral embolization    Current Outpatient Medications  Medication Sig Dispense Refill  . acetaminophen (TYLENOL) 500 MG tablet Take 1,000 mg by mouth every 6 (six) hours as needed for moderate pain.     Marland Kitchen ALPRAZolam (XANAX) 0.25 MG tablet Take 0.25 mg by mouth at bedtime.     Marland Kitchen aspirin EC 81 MG tablet Take 81 mg daily by mouth.     . Cholecalciferol 25 MCG (1000 UT) capsule Take 2,000 Units by mouth daily.    . diphenhydrAMINE (BENADRYL) 25 mg capsule Take 25 mg at bedtime by mouth.     . famotidine (PEPCID) 10 MG tablet Take 10 mg by mouth daily.     . pravastatin (PRAVACHOL) 20 MG tablet Take  20 mg daily by mouth.     . tamsulosin (FLOMAX) 0.4 MG CAPS capsule Take 0.4 mg by mouth daily.   3  . XARELTO 20 MG TABS tablet TAKE 1 TABLET BY MOUTH EVERY DAY *STOP ELIQUIS* (Patient taking differently: Take 20 mg by mouth daily with supper. ) 90 tablet 2  . amLODipine (NORVASC) 2.5 MG tablet Take 2.5 mg daily by mouth.     . hydrochlorothiazide (HYDRODIURIL) 25 MG tablet Take 25 mg daily by mouth.     . oxyCODONE (ROXICODONE) 5 MG immediate release tablet Take 1-2 tablets (5-10 mg total) by mouth every 4 (four) hours as needed for moderate pain or severe pain. (Patient not taking: Reported on 03/13/2020) 40 tablet 0   No current facility-administered medications for this visit.    Past Medical History:  Diagnosis Date  . Arthritis    Toes  . Coronary artery disease   . History of hiatal hernia   . Hyperlipidemia   . Hypertension   . Kidney disorder    "right kidney doesn't work"  . Presence of stent in artery    "4 in legs, 1 in neck"  . Vascular abnormality   . Wears dentures    full upper and lower    Past Surgical History:  Procedure Laterality Date  . BACK SURGERY    . CAROTID STENT Right   . CATARACT EXTRACTION W/PHACO Left 06/17/2019   Procedure: CATARACT EXTRACTION PHACO AND  INTRAOCULAR LENS PLACEMENT (IOC) LEFT;  Surgeon: Nevada Crane, MD;  Location: [facility];  Service: Ophthalmology;  Laterality: Left;  . CATARACT EXTRACTION W/PHACO Right 07/08/2019   Procedure: CATARACT EXTRACTION PHACO AND INTRAOCULAR LENS PLACEMENT (IOC) right  00:39.2  10.8%  5.02  ;  Surgeon: Nevada Crane, MD;  Location: [facility];  Service: Ophthalmology;  Laterality: Right;  . LEG SURGERY Bilateral    stent placement  . PERIPHERAL VASCULAR BALLOON ANGIOPLASTY Right 09/21/2017   Procedure: PERIPHERAL VASCULAR BALLOON ANGIOPLASTY;  Surgeon: Annice Needy, MD;  Location: [facility];  Service: Cardiovascular;  Laterality: Right;  . REVERSE SHOULDER  ARTHROPLASTY Right 11/26/2019   Procedure: REVERSE SHOULDER ARTHROPLASTY;  Surgeon: Christena Flake, MD;  Location: [facility];  Service: Orthopedics;  Laterality: Right;     Social History   Tobacco Use  . Smoking status: Former Smoker    Types: Cigarettes    Quit date: 2001    Years since quitting: 20.3  . Smokeless tobacco: Never Used  Substance Use Topics  . Alcohol use: Yes    Alcohol/week: 3.0 standard drinks    Types: 3 Cans of beer per week    Comment: occasionally  . Drug use: No     Family History  Problem Relation Age of Onset  . Cancer Mother   . Cancer Father   no bleeding or clotting disorders  Allergies  Allergen Reactions  . Codeine Itching     REVIEW OF SYSTEMS (Negative unless checked)  Constitutional: [] Weight loss  [] Fever  [] Chills Cardiac: [] Chest pain   [] Chest pressure   [] Palpitations   [] Shortness of breath when laying flat   [] Shortness of breath at rest   [] Shortness of breath with exertion. Vascular:  [] Pain in legs with walking   [] Pain in legs at rest   [] Pain in legs when laying flat   [x] Claudication   [] Pain in feet when walking  [] Pain in feet at rest  [] Pain in feet when laying flat   [] History of DVT   [] Phlebitis   [] Swelling in legs   [] Varicose veins   [] Non-healing ulcers Pulmonary:   [] Uses home oxygen   [] Productive cough   [] Hemoptysis   [] Wheeze  [] COPD   [] Asthma Neurologic:  [] Dizziness  [] Blackouts   [] Seizures   [] History of stroke   [] History of TIA  [] Aphasia   [] Temporary blindness   [] Dysphagia   [] Weakness or numbness in arms   [] Weakness or numbness in legs Musculoskeletal:  [x] Arthritis   [] Joint swelling   [x] Joint pain   [] Low back pain Hematologic:  [] Easy bruising  [] Easy bleeding   [] Hypercoagulable state   [] Anemic   Gastrointestinal:  [] Blood in stool   [] Vomiting blood  [] Gastroesophageal reflux/heartburn   [] Abdominal pain Genitourinary:  [] Chronic kidney disease   [] Difficult urination  [] Frequent urination   [] Burning with urination   [] Hematuria Skin:  [] Rashes   [] Ulcers   [] Wounds Psychological:  [] History of anxiety   []  History of major depression.  Physical Examination  BP (!) 155/64 (BP Location: Left Arm)   Pulse 81   Resp 17   Ht 5\' 6"  (1.676 m)   Wt 139 lb (63 kg)   BMI 22.44 kg/m  Gen:  WD/WN, NAD. Appears younger than stated age. Head: [place]/AT, No temporalis wasting. Ear/Nose/Throat: Hearing grossly intact, nares w/o erythema or drainage Eyes: Conjunctiva clear. Sclera non-icteric Neck: Supple.  Trachea midline Pulmonary:  Good air movement, no use of accessory  muscles.  Cardiac: RRR, no JVD Vascular:  Vessel Right Left  Radial Palpable Palpable                          PT Palpable Palpable  DP Palpable Palpable   Gastrointestinal: soft, non-tender/non-distended. Mildly increased aortic impulse Musculoskeletal: M/S 5/5 throughout.  No deformity or atrophy. No edema. Neurologic: Sensation grossly intact in extremities.  Symmetrical.  Speech is fluent.  Psychiatric: Judgment intact, Mood & affect appropriate for pt's clinical situation. Dermatologic: No rashes or ulcers noted.  No cellulitis or open wounds.       Labs No results found for this or any previous visit (from the past 2160 hour(s)).  Radiology No results found.  Assessment/Plan Hyperlipidemia lipid control important in reducing the progression of atherosclerotic disease. Continue statin therapy   Essential hypertension, benign blood pressure control important in reducing the progression of atherosclerotic disease. On appropriate oral medications.  Carotid stenosis Duplex today shows this to be widely patent without recurrent stenosis.  He has stable moderately elevated velocities in the left common carotid artery with less than 40% stenosis in the left internal carotid artery.  Continue aspirin and statin agent.  Recheck annually.  Peripheral vascular disease (HCC) ABIs today are normal  at 0.94 on the right and 1.0 on the left after previous revascularization.  No disabling claudication or limb threatening symptoms.  Continue current medical regimen.  Recheck in 1 year  AAA (abdominal aortic aneurysm) without rupture (HCC) Aortic duplex today shows previous iliac stents to have good flow without high-grade stenosis, but his aneurysm has increased in size and measures 4.49 cm today.  Previously this was 3.8 cm in maximal diameter 1 year ago.  This is a little more concerning and certainly will need to shorten his follow-up.  Is still below the size for threshold of repair and has not grown fast enough to deem it a rapid growth requiring treatment, but a short and follow-up of 6 months will be planned at this point.  If he continues to have growth and approaches 5 cm, we should likely then consider repair.    Festus Barren, MD  03/13/2020 11:51 AM    This note was created with Dragon medical transcription system.  Any errors from dictation are purely unintentional

## 2020-05-15 ENCOUNTER — Other Ambulatory Visit (INDEPENDENT_AMBULATORY_CARE_PROVIDER_SITE_OTHER): Payer: Self-pay | Admitting: Vascular Surgery

## 2020-05-25 DIAGNOSIS — M7021 Olecranon bursitis, right elbow: Secondary | ICD-10-CM | POA: Insufficient documentation

## 2020-05-25 HISTORY — DX: Olecranon bursitis, right elbow: M70.21

## 2020-09-08 ENCOUNTER — Ambulatory Visit (INDEPENDENT_AMBULATORY_CARE_PROVIDER_SITE_OTHER): Payer: Medicare Other | Admitting: Vascular Surgery

## 2020-09-08 ENCOUNTER — Other Ambulatory Visit: Payer: Self-pay

## 2020-09-08 ENCOUNTER — Ambulatory Visit (INDEPENDENT_AMBULATORY_CARE_PROVIDER_SITE_OTHER): Payer: Medicare Other

## 2020-09-08 ENCOUNTER — Encounter (INDEPENDENT_AMBULATORY_CARE_PROVIDER_SITE_OTHER): Payer: Self-pay | Admitting: Vascular Surgery

## 2020-09-08 VITALS — BP 148/62 | HR 66 | Resp 16 | Wt 143.0 lb

## 2020-09-08 DIAGNOSIS — I10 Essential (primary) hypertension: Secondary | ICD-10-CM | POA: Diagnosis not present

## 2020-09-08 DIAGNOSIS — I714 Abdominal aortic aneurysm, without rupture, unspecified: Secondary | ICD-10-CM

## 2020-09-08 DIAGNOSIS — I6523 Occlusion and stenosis of bilateral carotid arteries: Secondary | ICD-10-CM

## 2020-09-08 DIAGNOSIS — E785 Hyperlipidemia, unspecified: Secondary | ICD-10-CM

## 2020-09-08 DIAGNOSIS — I739 Peripheral vascular disease, unspecified: Secondary | ICD-10-CM

## 2020-09-08 NOTE — Assessment & Plan Note (Signed)
Duplex today shows a 4.2 cm abdominal aortic aneurysm.  This is more in line from his previous studies and the last check 6 months ago may have been a slight overestimate 4.5 cm.  Either way, with an aneurysm over 4 cm this should be checked every 6 months with duplex.  Blood pressure control and remaining abstinent from tobacco are the hallmarks to avoid growth.  Contact our office with any problems in the interim.

## 2020-09-08 NOTE — Progress Notes (Signed)
MRN : [id number]  Bryan Welch is a 84 y.o. (August 10, 1936) male who presents with chief complaint of  Chief Complaint  Patient presents with  . Follow-up    69month ultrasound follow up  .  History of Present Illness: Patient returns today in follow up of his abdominal aortic aneurysm.  He also has peripheral arterial disease and carotid disease that has been stable and are to be checked next spring.  At his last visit, his abdominal aortic aneurysm had grown from approximately 3.8 cm up to 4.5 cm.  We did a 7-month follow-up, and today on duplex his aneurysm measures 4.2 cm in maximal diameter.  He does not have any aneurysm related symptoms. Specifically, the patient denies new back or abdominal pain, or signs of peripheral embolization   Current Outpatient Medications  Medication Sig Dispense Refill  . acetaminophen (TYLENOL) 500 MG tablet Take 1,000 mg by mouth every 6 (six) hours as needed for moderate pain.     Marland Kitchen ALPRAZolam (XANAX) 0.25 MG tablet Take 0.25 mg by mouth at bedtime.     Marland Kitchen aspirin EC 81 MG tablet Take 81 mg daily by mouth.     . Cholecalciferol 25 MCG (1000 UT) capsule Take 2,000 Units by mouth daily.    . diphenhydrAMINE (BENADRYL) 25 mg capsule Take 25 mg at bedtime by mouth.     . famotidine (PEPCID) 10 MG tablet Take 10 mg by mouth daily.     . ferrous sulfate 325 (65 FE) MG tablet Take 325 mg by mouth daily with breakfast.    . pravastatin (PRAVACHOL) 20 MG tablet Take 20 mg daily by mouth.     . tamsulosin (FLOMAX) 0.4 MG CAPS capsule Take 0.4 mg by mouth daily.   3  . XARELTO 20 MG TABS tablet TAKE 1 TABLET BY MOUTH EVERY DAY *STOP ELIQUIS* 90 tablet 2  . amLODipine (NORVASC) 2.5 MG tablet Take 2.5 mg daily by mouth.  (Patient not taking: Reported on 09/08/2020)    . hydrochlorothiazide (HYDRODIURIL) 25 MG tablet Take 25 mg daily by mouth.  (Patient not taking: Reported on 09/08/2020)    . oxyCODONE (ROXICODONE) 5 MG immediate release tablet Take 1-2 tablets  (5-10 mg total) by mouth every 4 (four) hours as needed for moderate pain or severe pain. (Patient not taking: Reported on 03/13/2020) 40 tablet 0   No current facility-administered medications for this visit.    Past Medical History:  Diagnosis Date  . Arthritis    Toes  . Coronary artery disease   . History of hiatal hernia   . Hyperlipidemia   . Hypertension   . Kidney disorder    "right kidney doesn't work"  . Presence of stent in artery    "4 in legs, 1 in neck"  . Vascular abnormality   . Wears dentures    full upper and lower    Past Surgical History:  Procedure Laterality Date  . BACK SURGERY    . CAROTID STENT Right   . CATARACT EXTRACTION W/PHACO Left 06/17/2019   Procedure: CATARACT EXTRACTION PHACO AND INTRAOCULAR LENS PLACEMENT (IOC) LEFT;  Surgeon: Nevada Crane, MD;  Location: [facility];  Service: Ophthalmology;  Laterality: Left;  . CATARACT EXTRACTION W/PHACO Right 07/08/2019   Procedure: CATARACT EXTRACTION PHACO AND INTRAOCULAR LENS PLACEMENT (IOC) right  00:39.2  10.8%  5.02  ;  Surgeon: Nevada Crane, MD;  Location: [facility];  Service: Ophthalmology;  Laterality: Right;  .  LEG SURGERY Bilateral    stent placement  . PERIPHERAL VASCULAR BALLOON ANGIOPLASTY Right 09/21/2017   Procedure: PERIPHERAL VASCULAR BALLOON ANGIOPLASTY;  Surgeon: Annice Needy, MD;  Location: [facility];  Service: Cardiovascular;  Laterality: Right;  . REVERSE SHOULDER ARTHROPLASTY Right 11/26/2019   Procedure: REVERSE SHOULDER ARTHROPLASTY;  Surgeon: Christena Flake, MD;  Location: [facility];  Service: Orthopedics;  Laterality: Right;     Social History   Tobacco Use  . Smoking status: Former Smoker    Types: Cigarettes    Quit date: 2001    Years since quitting: 20.8  . Smokeless tobacco: Never Used  Vaping Use  . Vaping Use: Never used  Substance Use Topics  . Alcohol use: Yes    Alcohol/week: 3.0 standard drinks    Types: 3 Cans of beer  per week    Comment: occasionally  . Drug use: No     Family History  Problem Relation Age of Onset  . Cancer Mother   . Cancer Father   no bleeding or clotting disorders  Allergies  Allergen Reactions  . Codeine Itching    REVIEW OF SYSTEMS (Negative unless checked)  Constitutional: [] ?Weight loss  [] ?Fever  [] ?Chills Cardiac: [] ?Chest pain   [] ?Chest pressure   [] ?Palpitations   [] ?Shortness of breath when laying flat   [] ?Shortness of breath at rest   [] ?Shortness of breath with exertion. Vascular:  [] ?Pain in legs with walking   [] ?Pain in legs at rest   [] ?Pain in legs when laying flat   [x] ?Claudication   [] ?Pain in feet when walking  [] ?Pain in feet at rest  [] ?Pain in feet when laying flat   [] ?History of DVT   [] ?Phlebitis   [] ?Swelling in legs   [] ?Varicose veins   [] ?Non-healing ulcers Pulmonary:   [] ?Uses home oxygen   [] ?Productive cough   [] ?Hemoptysis   [] ?Wheeze  [] ?COPD   [] ?Asthma Neurologic:  [] ?Dizziness  [] ?Blackouts   [] ?Seizures   [] ?History of stroke   [] ?History of TIA  [] ?Aphasia   [] ?Temporary blindness   [] ?Dysphagia   [] ?Weakness or numbness in arms   [] ?Weakness or numbness in legs Musculoskeletal:  [x] ?Arthritis   [] ?Joint swelling   [x] ?Joint pain   [] ?Low back pain Hematologic:  [] ?Easy bruising  [] ?Easy bleeding   [] ?Hypercoagulable state   [] ?Anemic   Gastrointestinal:  [] ?Blood in stool   [] ?Vomiting blood  [] ?Gastroesophageal reflux/heartburn   [] ?Abdominal pain Genitourinary:  [] ?Chronic kidney disease   [] ?Difficult urination  [] ?Frequent urination  [] ?Burning with urination   [] ?Hematuria Skin:  [] ?Rashes   [] ?Ulcers   [] ?Wounds Psychological:  [] ?History of anxiety   [] ? History of major depression.  Physical Examination  BP (!) 148/62 (BP Location: Right Arm)   Pulse 66   Resp 16   Wt 143 lb (64.9 kg)   BMI 23.08 kg/m  Gen:  WD/WN, NAD Head: [place]/AT, No temporalis wasting. Ear/Nose/Throat: Hearing grossly intact, nares w/o erythema or  drainage Eyes: Conjunctiva clear. Sclera non-icteric Neck: Supple.  Trachea midline Pulmonary:  Good air movement, no use of accessory muscles.  Cardiac: RRR, no JVD Vascular:  Vessel Right Left  Radial Palpable Palpable                          PT Palpable Palpable  DP Palpable Palpable   Gastrointestinal: soft, non-tender/non-distended. Mildly increased aortic impulse Musculoskeletal: M/S 5/5 throughout.  No deformity or atrophy. Trace LE edema. Neurologic: Sensation grossly intact  in extremities.  Symmetrical.  Speech is fluent.  Psychiatric: Judgment intact, Mood & affect appropriate for pt's clinical situation. Dermatologic: No rashes or ulcers noted.  No cellulitis or open wounds.       Labs No results found for this or any previous visit (from the past 2160 hour(s)).  Radiology No results found.  Assessment/Plan Hyperlipidemia lipid control important in reducing the progression of atherosclerotic disease. Continue statin therapy   Essential hypertension, benign blood pressure control important in reducing the progression of atherosclerotic disease. On appropriate oral medications.  Carotid stenosis Status post right carotid endarterectomy.  To be checked in about 6 months.  No new symptoms.  AAA (abdominal aortic aneurysm) without rupture (HCC) Duplex today shows a 4.2 cm abdominal aortic aneurysm.  This is more in line from his previous studies and the last check 6 months ago may have been a slight overestimate 4.5 cm.  Either way, with an aneurysm over 4 cm this should be checked every 6 months with duplex.  Blood pressure control and remaining abstinent from tobacco are the hallmarks to avoid growth.  Contact our office with any problems in the interim.    Festus Barren, MD  09/08/2020 9:40 AM    This note was created with Dragon medical transcription system.  Any errors from dictation are purely unintentional

## 2020-09-08 NOTE — Assessment & Plan Note (Signed)
Status post right carotid endarterectomy.  To be checked in about 6 months.  No new symptoms.

## 2020-10-21 ENCOUNTER — Telehealth (INDEPENDENT_AMBULATORY_CARE_PROVIDER_SITE_OTHER): Payer: Self-pay | Admitting: Vascular Surgery

## 2020-10-21 NOTE — Telephone Encounter (Signed)
Called stating he went to the walk-in clinic yesterday 10/20/20 for cramping in rle. Patient has been experiencing cramping since earlier yesterday morning. Walk in clinic prescribed doxycyline and prednisone. Patient would like to come in to be seen. He mentioned the pain being like an electric shock, stating it's hard for him to walk around on it. He also said it comes in spells. Patient was last seen 09/08/20 with AAA studies (JD) Please advise.

## 2020-10-21 NOTE — Telephone Encounter (Signed)
He can come in with rle dvt study and abis.Marland Kitchenme or dew. Marland Kitchen.I would also strongly suggest he also make an appointment with his pcp as the description and his recent lab work suggest it may be related to gout or possible sciatica.

## 2020-10-21 NOTE — Telephone Encounter (Signed)
Patient was made aware with medical advice and is ready to schedule his appointment

## 2020-10-29 ENCOUNTER — Other Ambulatory Visit (INDEPENDENT_AMBULATORY_CARE_PROVIDER_SITE_OTHER): Payer: Self-pay | Admitting: Nurse Practitioner

## 2020-10-29 ENCOUNTER — Other Ambulatory Visit (INDEPENDENT_AMBULATORY_CARE_PROVIDER_SITE_OTHER): Payer: Self-pay | Admitting: Vascular Surgery

## 2020-10-29 DIAGNOSIS — I714 Abdominal aortic aneurysm, without rupture, unspecified: Secondary | ICD-10-CM

## 2020-10-29 DIAGNOSIS — R252 Cramp and spasm: Secondary | ICD-10-CM

## 2020-10-29 DIAGNOSIS — M79604 Pain in right leg: Secondary | ICD-10-CM

## 2020-11-05 ENCOUNTER — Ambulatory Visit (INDEPENDENT_AMBULATORY_CARE_PROVIDER_SITE_OTHER): Payer: Medicare Other

## 2020-11-05 ENCOUNTER — Ambulatory Visit (INDEPENDENT_AMBULATORY_CARE_PROVIDER_SITE_OTHER): Payer: Medicare Other | Admitting: Nurse Practitioner

## 2020-11-05 ENCOUNTER — Encounter (INDEPENDENT_AMBULATORY_CARE_PROVIDER_SITE_OTHER): Payer: Self-pay | Admitting: Nurse Practitioner

## 2020-11-05 ENCOUNTER — Other Ambulatory Visit: Payer: Self-pay

## 2020-11-05 VITALS — BP 152/76 | HR 69 | Ht 66.0 in | Wt 145.0 lb

## 2020-11-05 DIAGNOSIS — I714 Abdominal aortic aneurysm, without rupture, unspecified: Secondary | ICD-10-CM

## 2020-11-05 DIAGNOSIS — I10 Essential (primary) hypertension: Secondary | ICD-10-CM

## 2020-11-05 DIAGNOSIS — I739 Peripheral vascular disease, unspecified: Secondary | ICD-10-CM

## 2020-11-05 DIAGNOSIS — R252 Cramp and spasm: Secondary | ICD-10-CM | POA: Diagnosis not present

## 2020-11-05 DIAGNOSIS — M79604 Pain in right leg: Secondary | ICD-10-CM

## 2020-11-05 DIAGNOSIS — I6523 Occlusion and stenosis of bilateral carotid arteries: Secondary | ICD-10-CM

## 2020-11-05 DIAGNOSIS — E785 Hyperlipidemia, unspecified: Secondary | ICD-10-CM

## 2020-11-05 NOTE — Progress Notes (Signed)
Subjective:    Patient ID: Bryan Welch, male    DOB: Apr 16, 1936, 84 y.o.   MRN: [id number] Chief Complaint  Patient presents with  . Follow-up    U/S    The patient presents today after sudden severe cramping pain in his right foot that radiated up.  The patient notes that he was seen in urgent care however he was treated for gout at that time.  The patient notes that now his pain is completely healed and he does not have any other cramping or issues.  He denies any open wounds or ulcerations.  He denies any excessive lower extremity swelling.  Overall he notes he is doing well.  Today noninvasive study showed no evidence of DVT or superficial thrombophlebitis in the right lower extremity.  No cystic structure in the popliteal fossa.  The patient has an ABI of 1.02 on the right and 0.91 on the left.  Previous studies are on 03/13/2020 show an ABI 0.94 on the right and 1.01 on the left.  [name] TBI are within normal range.  The right lower extremity has monophasic/triphasic waveforms in the tibial arteries with biphasic waveforms in the left tibial arteries.  Patient has good toe waveforms bilaterally.   Review of Systems     Objective:   Physical Exam  BP (!) 152/76   Pulse 69   Ht 5\' 6"  (1.676 m)   Wt 145 lb (65.8 kg)   BMI 23.40 kg/m   Past Medical History:  Diagnosis Date  . Arthritis    Toes  . Coronary artery disease   . History of hiatal hernia   . Hyperlipidemia   . Hypertension   . Kidney disorder    "right kidney doesn't work"  . Presence of stent in artery    "4 in legs, 1 in neck"  . Vascular abnormality   . Wears dentures    full upper and lower    Social History   Socioeconomic History  . Marital status: Married    Spouse name: Not on file  . Number of children: Not on file  . Years of education: Not on file  . Highest education level: Not on file  Occupational History  . Not on file  Tobacco Use  . Smoking status: Former Smoker     Types: Cigarettes    Quit date: 2001    Years since quitting: 21.0  . Smokeless tobacco: Never Used  Vaping Use  . Vaping Use: Never used  Substance and Sexual Activity  . Alcohol use: Yes    Alcohol/week: 3.0 standard drinks    Types: 3 Cans of beer per week    Comment: occasionally  . Drug use: No  . Sexual activity: Not on file  Other Topics Concern  . Not on file  Social History Narrative  . Not on file   Social Determinants of Health   Financial Resource Strain: Not on file  Food Insecurity: Not on file  Transportation Needs: Not on file  Physical Activity: Not on file  Stress: Not on file  Social Connections: Not on file  Intimate Partner Violence: Not on file    Past Surgical History:  Procedure Laterality Date  . BACK SURGERY    . CAROTID STENT Right   . CATARACT EXTRACTION W/PHACO Left 06/17/2019   Procedure: CATARACT EXTRACTION PHACO AND INTRAOCULAR LENS PLACEMENT (IOC) LEFT;  Surgeon: 08/17/2019, MD;  Location: [facility];  Service: Ophthalmology;  Laterality: Left;  .  CATARACT EXTRACTION W/PHACO Right 07/08/2019   Procedure: CATARACT EXTRACTION PHACO AND INTRAOCULAR LENS PLACEMENT (IOC) right  00:39.2  10.8%  5.02  ;  Surgeon: Nevada Crane, MD;  Location: [facility];  Service: Ophthalmology;  Laterality: Right;  . LEG SURGERY Bilateral    stent placement  . PERIPHERAL VASCULAR BALLOON ANGIOPLASTY Right 09/21/2017   Procedure: PERIPHERAL VASCULAR BALLOON ANGIOPLASTY;  Surgeon: Annice Needy, MD;  Location: [facility] INVASIVE CV LAB;  Service: Cardiovascular;  Laterality: Right;  . REVERSE SHOULDER ARTHROPLASTY Right 11/26/2019   Procedure: REVERSE SHOULDER ARTHROPLASTY;  Surgeon: Christena Flake, MD;  Location: [facility] ORS;  Service: Orthopedics;  Laterality: Right;    Family History  Problem Relation Age of Onset  . Cancer Mother   . Cancer Father     Allergies  Allergen Reactions  . Codeine Itching    CBC Latest Ref Rng & Units  11/21/2019 09/22/2017 09/21/2017  WBC 4.0 - 10.5 K/uL 6.6 9.2 6.7  Hemoglobin 13.0 - 17.0 g/dL 10.8(L) 15.9 15.4  Hematocrit 39.0 - 52.0 % 35.9(L) 47.2 44.6  Platelets 150 - 400 K/uL 296 129(L) 182      CMP     Component Value Date/Time   NA 139 11/21/2019 0911   NA 141 03/22/2013 0237   K 3.9 11/21/2019 0911   K 4.3 03/22/2013 0237   CL 102 11/21/2019 0911   CL 110 (H) 03/22/2013 0237   CO2 26 11/21/2019 0911   CO2 24 03/22/2013 0237   GLUCOSE 108 (H) 11/21/2019 0911   GLUCOSE 101 (H) 03/22/2013 0237   BUN 29 (H) 11/21/2019 0911   BUN 19 (H) 03/22/2013 0237   CREATININE 1.69 (H) 11/21/2019 0911   CREATININE 1.53 (H) 03/22/2013 0237   CALCIUM 9.5 11/21/2019 0911   CALCIUM 8.1 (L) 03/22/2013 0237   PROT 7.7 11/21/2019 0911   PROT 8.3 (H) 03/13/2013 1507   ALBUMIN 4.2 11/21/2019 0911   ALBUMIN 4.0 03/13/2013 1507   AST 20 11/21/2019 0911   AST 58 (H) 03/13/2013 1507   ALT 11 11/21/2019 0911   ALT 52 03/13/2013 1507   ALKPHOS 58 11/21/2019 0911   ALKPHOS 116 03/13/2013 1507   BILITOT 0.6 11/21/2019 0911   BILITOT 0.4 03/13/2013 1507   GFRNONAA 37 (L) 11/21/2019 0911   GFRNONAA 44 (L) 03/22/2013 0237   GFRAA 43 (L) 11/21/2019 0911   GFRAA 51 (L) 03/22/2013 0237     No results found.     Assessment & Plan:   1. Peripheral vascular disease (HCC)  Recommend:  The patient has evidence of atherosclerosis of the lower extremities with claudication.  The patient does not voice lifestyle limiting changes at this point in time.  Noninvasive studies do not suggest clinically significant change.  No invasive studies, angiography or surgery at this time The patient should continue walking and begin a more formal exercise program.  The patient should continue antiplatelet therapy and aggressive treatment of the lipid abnormalities  No changes in the patient's medications at this time  The patient should continue wearing graduated compression socks 10-15 mmHg strength to  control the mild edema.    2. Benign essential hypertension Continue antihypertensive medications as already ordered, these medications have been reviewed and there are no changes at this time.   3. Hyperlipidemia, unspecified hyperlipidemia type Continue statin as ordered and reviewed, no changes at this time   4. AAA (abdominal aortic aneurysm) without rupture (HCC) No signs symptoms of worsening abdominal aortic aneurysm, the  patient has upcoming appointment soon for evaluation.  5. Bilateral carotid artery stenosis Patient not exhibiting any signs symptoms of worsening carotid artery disease.  He has upcoming evaluation.  We will continue with antiplatelet and statin.   Current Outpatient Medications on File Prior to Visit  Medication Sig Dispense Refill  . acetaminophen (TYLENOL) 500 MG tablet Take 1,000 mg by mouth every 6 (six) hours as needed for moderate pain.     Marland Kitchen ALPRAZolam (XANAX) 0.25 MG tablet Take 0.25 mg by mouth at bedtime.     Marland Kitchen amLODipine (NORVASC) 2.5 MG tablet Take 2.5 mg by mouth daily.    Marland Kitchen aspirin EC 81 MG tablet Take 81 mg daily by mouth.     . Cholecalciferol 25 MCG (1000 UT) capsule Take 2,000 Units by mouth daily.    . diphenhydrAMINE (BENADRYL) 25 mg capsule Take 25 mg at bedtime by mouth.     . famotidine (PEPCID) 10 MG tablet Take 10 mg by mouth daily.     . ferrous sulfate 325 (65 FE) MG tablet Take 325 mg by mouth daily with breakfast.    . gabapentin (NEURONTIN) 300 MG capsule Take by mouth.    . hydrochlorothiazide (HYDRODIURIL) 25 MG tablet Take 25 mg by mouth daily.    Marland Kitchen oxyCODONE (ROXICODONE) 5 MG immediate release tablet Take 1-2 tablets (5-10 mg total) by mouth every 4 (four) hours as needed for moderate pain or severe pain. 40 tablet 0  . pravastatin (PRAVACHOL) 20 MG tablet Take 20 mg daily by mouth.     . tamsulosin (FLOMAX) 0.4 MG CAPS capsule Take 0.4 mg by mouth daily.   3  . XARELTO 20 MG TABS tablet TAKE 1 TABLET BY MOUTH EVERY DAY  *STOP ELIQUIS* 90 tablet 2   No current facility-administered medications on file prior to visit.    There are no Patient Instructions on file for this visit. No follow-ups on file.   Georgiana Spinner, NP

## 2021-01-19 ENCOUNTER — Other Ambulatory Visit (INDEPENDENT_AMBULATORY_CARE_PROVIDER_SITE_OTHER): Payer: Self-pay | Admitting: Vascular Surgery

## 2021-03-09 ENCOUNTER — Ambulatory Visit (INDEPENDENT_AMBULATORY_CARE_PROVIDER_SITE_OTHER): Payer: Medicare Other | Admitting: Vascular Surgery

## 2021-03-09 ENCOUNTER — Encounter (INDEPENDENT_AMBULATORY_CARE_PROVIDER_SITE_OTHER): Payer: Medicare Other

## 2021-03-16 ENCOUNTER — Ambulatory Visit (INDEPENDENT_AMBULATORY_CARE_PROVIDER_SITE_OTHER): Payer: Medicare Other

## 2021-03-16 ENCOUNTER — Other Ambulatory Visit: Payer: Self-pay

## 2021-03-16 ENCOUNTER — Ambulatory Visit (INDEPENDENT_AMBULATORY_CARE_PROVIDER_SITE_OTHER): Payer: Medicare Other | Admitting: Vascular Surgery

## 2021-03-16 ENCOUNTER — Encounter (INDEPENDENT_AMBULATORY_CARE_PROVIDER_SITE_OTHER): Payer: Self-pay | Admitting: Vascular Surgery

## 2021-03-16 VITALS — BP 150/76 | HR 76 | Ht 66.0 in | Wt 144.0 lb

## 2021-03-16 DIAGNOSIS — I10 Essential (primary) hypertension: Secondary | ICD-10-CM | POA: Diagnosis not present

## 2021-03-16 DIAGNOSIS — I6523 Occlusion and stenosis of bilateral carotid arteries: Secondary | ICD-10-CM

## 2021-03-16 DIAGNOSIS — I739 Peripheral vascular disease, unspecified: Secondary | ICD-10-CM

## 2021-03-16 DIAGNOSIS — I714 Abdominal aortic aneurysm, without rupture, unspecified: Secondary | ICD-10-CM

## 2021-03-16 DIAGNOSIS — E785 Hyperlipidemia, unspecified: Secondary | ICD-10-CM

## 2021-03-16 NOTE — Assessment & Plan Note (Signed)
Duplex today shows this to be patent without significant recurrent stenosis.  He has velocities of fall in the upper end of the 40 to 59% range on the left.  No role for intervention at this degree of stenosis on the left.  Continue current medical regimen.  Recheck in 1 year.

## 2021-03-16 NOTE — Progress Notes (Signed)
MRN : [id number]  Bryan Welch is a 85 y.o. (Sep 11, 1936) male who presents with chief complaint of  Chief Complaint  Patient presents with  . Follow-up    6 MO AAA Carotid U/S   . Carotid  .  History of Present Illness: Patient returns in follow-up of multiple vascular issues.  He is doing well today and does not have complaints.  He denies any aneurysm related symptoms. Specifically, the patient denies new back or abdominal pain, or signs of peripheral embolization. Duplex today measures his abdominal aortic aneurysm at 4.0 cm in maximal diameter.  This was seen at about 4.2 cm last time so there has been no significant growth. He is also followed for carotid disease.  He is several years status post right carotid endarterectomy for high-grade stenosis.  Duplex today shows this to be patent without significant recurrent stenosis.  He has velocities of fall in the upper end of the 40 to 59% range on the left. He is also followed for peripheral arterial disease.  He has had 2 previous interventions to the lower extremities in the past decade.  These were on the right leg.  Mild claudication symptoms currently but nothing disabling or lifestyle limiting.  No limb threatening symptoms. ABIs today are down a bit 0.79 on the right and 0.83 on the left with strong monophasic waveforms.  Current Outpatient Medications  Medication Sig Dispense Refill  . acetaminophen (TYLENOL) 500 MG tablet Take 1,000 mg by mouth every 6 (six) hours as needed for moderate pain.     Marland Kitchen ALPRAZolam (XANAX) 0.25 MG tablet Take 0.25 mg by mouth at bedtime.     Marland Kitchen aspirin EC 81 MG tablet Take 81 mg daily by mouth.     . Cholecalciferol 25 MCG (1000 UT) capsule Take 2,000 Units by mouth daily.    . diphenhydrAMINE (BENADRYL) 25 mg capsule Take 25 mg at bedtime by mouth.     . famotidine (PEPCID) 10 MG tablet Take 10 mg by mouth daily.     . ferrous sulfate 325 (65 FE) MG tablet Take 325 mg by mouth daily with  breakfast.    . gabapentin (NEURONTIN) 300 MG capsule Take by mouth.    . oxyCODONE (ROXICODONE) 5 MG immediate release tablet Take 1-2 tablets (5-10 mg total) by mouth every 4 (four) hours as needed for moderate pain or severe pain. 40 tablet 0  . pravastatin (PRAVACHOL) 20 MG tablet Take 20 mg daily by mouth.     . tamsulosin (FLOMAX) 0.4 MG CAPS capsule Take 0.4 mg by mouth daily.   3  . XARELTO 20 MG TABS tablet TAKE 1 TABLET BY MOUTH EVERY DAY *STOP ELIQUIS* 90 tablet 2  . amLODipine (NORVASC) 2.5 MG tablet Take 2.5 mg by mouth daily. (Patient not taking: Reported on 03/16/2021)    . hydrochlorothiazide (HYDRODIURIL) 25 MG tablet Take 25 mg by mouth daily. (Patient not taking: Reported on 03/16/2021)     No current facility-administered medications for this visit.    Past Medical History:  Diagnosis Date  . Arthritis    Toes  . Coronary artery disease   . History of hiatal hernia   . Hyperlipidemia   . Hypertension   . Kidney disorder    "right kidney doesn't work"  . Presence of stent in artery    "4 in legs, 1 in neck"  . Vascular abnormality   . Wears dentures    full upper and lower  Past Surgical History:  Procedure Laterality Date  . BACK SURGERY    . CAROTID STENT Right   . CATARACT EXTRACTION W/PHACO Left 06/17/2019   Procedure: CATARACT EXTRACTION PHACO AND INTRAOCULAR LENS PLACEMENT (IOC) LEFT;  Surgeon: Nevada Crane, MD;  Location: [facility];  Service: Ophthalmology;  Laterality: Left;  . CATARACT EXTRACTION W/PHACO Right 07/08/2019   Procedure: CATARACT EXTRACTION PHACO AND INTRAOCULAR LENS PLACEMENT (IOC) right  00:39.2  10.8%  5.02  ;  Surgeon: Nevada Crane, MD;  Location: [facility];  Service: Ophthalmology;  Laterality: Right;  . LEG SURGERY Bilateral    stent placement  . PERIPHERAL VASCULAR BALLOON ANGIOPLASTY Right 09/21/2017   Procedure: PERIPHERAL VASCULAR BALLOON ANGIOPLASTY;  Surgeon: Annice Needy, MD;  Location: [facility]  INVASIVE CV LAB;  Service: Cardiovascular;  Laterality: Right;  . REVERSE SHOULDER ARTHROPLASTY Right 11/26/2019   Procedure: REVERSE SHOULDER ARTHROPLASTY;  Surgeon: Christena Flake, MD;  Location: [facility] OR[facility];  Service: Orthopedics;  Laterality: Right;     Social History        Tobacco Use  . Smoking status: Former Smoker    Types: Cigarettes    Quit date: 2001    Years since quitting: 20.8  . Smokeless tobacco: Never Used  Vaping Use  . Vaping Use: Never used  Substance Use Topics  . Alcohol use: Yes    Alcohol/week: 3.0 standard drinks    Types: 3 Cans of beer per week    Comment: occasionally  . Drug use: No          Family History  Problem Relation Age of Onset  . Cancer Mother   . Cancer Father   no bleeding or clotting disorders      Allergies  Allergen Reactions  . Codeine Itching    REVIEW OF SYSTEMS(Negative unless checked)  Constitutional: [] ??Weight loss [] ??Fever [] ??Chills Cardiac: [] ??Chest pain [] ??Chest pressure [] ??Palpitations [] ??Shortness of breath when laying flat [] ??Shortness of breath at rest [] ??Shortness of breath with exertion. Vascular: [] ??Pain in legs with walking [] ??Pain in legs at rest [] ??Pain in legs when laying flat [x] ??Claudication [] ??Pain in feet when walking [] ??Pain in feet at rest [] ??Pain in feet when laying flat [] ??History of DVT [] ??Phlebitis [] ??Swelling in legs [] ??Varicose veins [] ??Non-healing ulcers Pulmonary: [] ??Uses home oxygen [] ??Productive cough [] ??Hemoptysis [] ??Wheeze [] ??COPD [] ??Asthma Neurologic: [] ??Dizziness [] ??Blackouts [] ??Seizures [] ??History of stroke [] ??History of TIA [] ??Aphasia [] ??Temporary blindness [] ??Dysphagia [] ??Weakness or numbness in arms [] ??Weakness or numbness in legs Musculoskeletal: [x] ??Arthritis [] ??Joint swelling [x] ??Joint pain [] ??Low back pain Hematologic: [] ??Easy bruising [] ??Easy  bleeding [] ??Hypercoagulable state [] ??Anemic  Gastrointestinal: [] ??Blood in stool [] ??Vomiting blood [] ??Gastroesophageal reflux/heartburn [] ??Abdominal pain Genitourinary: [] ??Chronic kidney disease [] ??Difficult urination [] ??Frequent urination [] ??Burning with urination [] ??Hematuria Skin: [] ??Rashes [] ??Ulcers [] ??Wounds Psychological: [] ??History of anxiety [] ??History of major depression.   Physical Examination  Vitals:   03/16/21 1026  BP: (!) 150/76  Pulse: 76  Weight: 144 lb (65.3 kg)  Height: 5\' 6"  (1.676 m)   Body mass index is 23.24 kg/m. Gen:  WD/WN, NAD Head: [place]/AT, No temporalis wasting. Ear/Nose/Throat: Hearing grossly intact, nares w/o erythema or drainage, trachea midline Eyes: Conjunctiva clear. Sclera non-icteric Neck: Supple.  Soft left carotid bruit  Pulmonary:  Good air movement, equal and clear to auscultation bilaterally.  Cardiac: RRR, No JVD Vascular:  Vessel Right Left  Radial Palpable Palpable                          PT 1+ Palpable 1+  Palpable  DP 1+ Palpable 1+ Palpable   Gastrointestinal: soft, non-tender/non-distended. No guarding/reflex.  Musculoskeletal: M/S 5/5 throughout.  No deformity or atrophy. No edema. Neurologic: CN 2-12 intact. Sensation grossly intact in extremities.  Symmetrical.  Speech is fluent. Motor exam as listed above. Psychiatric: Judgment intact, Mood & affect appropriate for pt's clinical situation. Dermatologic: No rashes or ulcers noted.  No cellulitis or open wounds.      CBC Lab Results  Component Value Date   WBC 6.6 11/21/2019   HGB 10.8 (L) 11/21/2019   HCT 35.9 (L) 11/21/2019   MCV 77.4 (L) 11/21/2019   PLT 296 11/21/2019    BMET    Component Value Date/Time   NA 139 11/21/2019 0911   NA 141 03/22/2013 0237   K 3.9 11/21/2019 0911   K 4.3 03/22/2013 0237   CL 102 11/21/2019 0911   CL 110 (H) 03/22/2013 0237   CO2 26 11/21/2019 0911   CO2 24 03/22/2013 0237    GLUCOSE 108 (H) 11/21/2019 0911   GLUCOSE 101 (H) 03/22/2013 0237   BUN 29 (H) 11/21/2019 0911   BUN 19 (H) 03/22/2013 0237   CREATININE 1.69 (H) 11/21/2019 0911   CREATININE 1.53 (H) 03/22/2013 0237   CALCIUM 9.5 11/21/2019 0911   CALCIUM 8.1 (L) 03/22/2013 0237   GFRNONAA 37 (L) 11/21/2019 0911   GFRNONAA 44 (L) 03/22/2013 0237   GFRAA 43 (L) 11/21/2019 0911   GFRAA 51 (L) 03/22/2013 0237   CrCl cannot be calculated (Patient's most recent lab result is older than the maximum 21 days allowed.).  COAG Lab Results  Component Value Date   INR 2.0 (H) 11/21/2019   INR 1.03 09/22/2017   INR 1.00 09/21/2017    Radiology No results found.   Assessment/Plan Hyperlipidemia lipid control important in reducing the progression of atherosclerotic disease. Continue statin therapy   Essential hypertension, benign blood pressure control important in reducing the progression of atherosclerotic disease. On appropriate oral medications.  AAA (abdominal aortic aneurysm) without rupture (HCC) Duplex today measures his abdominal aortic aneurysm at 4.0 cm in maximal diameter.  This was seen at about 4.2 cm last time so there has been no significant growth.  No role for intervention at this size.  Recheck in 6 months.  Carotid stenosis Duplex today shows this to be patent without significant recurrent stenosis.  He has velocities of fall in the upper end of the 40 to 59% range on the left.  No role for intervention at this degree of stenosis on the left.  Continue current medical regimen.  Recheck in 1 year.  Peripheral vascular disease (HCC) ABIs today are down a bit 0.79 on the right and 0.83 on the left with strong monophasic waveforms.  No current limb threatening or lifestyle limiting symptoms.  Continue current medical regimen.  Recheck in 1 year.    Festus Barren, MD  03/16/2021 11:14 AM    This note was created with Dragon medical transcription system.  Any errors from dictation  are purely unintentional

## 2021-03-16 NOTE — Assessment & Plan Note (Signed)
Duplex today measures his abdominal aortic aneurysm at 4.0 cm in maximal diameter.  This was seen at about 4.2 cm last time so there has been no significant growth.  No role for intervention at this size.  Recheck in 6 months.

## 2021-03-16 NOTE — Assessment & Plan Note (Signed)
ABIs today are down a bit 0.79 on the right and 0.83 on the left with strong monophasic waveforms.  No current limb threatening or lifestyle limiting symptoms.  Continue current medical regimen.  Recheck in 1 year.

## 2021-03-28 ENCOUNTER — Other Ambulatory Visit: Payer: Self-pay

## 2021-03-28 ENCOUNTER — Emergency Department
Admission: EM | Admit: 2021-03-28 | Discharge: 2021-03-28 | Disposition: A | Discharge status: Home / Self Care | Payer: Medicare Other | Attending: Emergency Medicine | Admitting: Emergency Medicine

## 2021-03-28 ENCOUNTER — Emergency Department: Payer: Medicare Other

## 2021-03-28 DIAGNOSIS — M542 Cervicalgia: Secondary | ICD-10-CM | POA: Diagnosis present

## 2021-03-28 DIAGNOSIS — Z7982 Long term (current) use of aspirin: Secondary | ICD-10-CM | POA: Diagnosis not present

## 2021-03-28 DIAGNOSIS — M25511 Pain in right shoulder: Secondary | ICD-10-CM | POA: Diagnosis not present

## 2021-03-28 DIAGNOSIS — Z87891 Personal history of nicotine dependence: Secondary | ICD-10-CM | POA: Insufficient documentation

## 2021-03-28 DIAGNOSIS — Z96611 Presence of right artificial shoulder joint: Secondary | ICD-10-CM | POA: Diagnosis not present

## 2021-03-28 DIAGNOSIS — Z7901 Long term (current) use of anticoagulants: Secondary | ICD-10-CM | POA: Diagnosis not present

## 2021-03-28 DIAGNOSIS — M5412 Radiculopathy, cervical region: Secondary | ICD-10-CM

## 2021-03-28 DIAGNOSIS — M5030 Other cervical disc degeneration, unspecified cervical region: Secondary | ICD-10-CM

## 2021-03-28 DIAGNOSIS — I2510 Atherosclerotic heart disease of native coronary artery without angina pectoris: Secondary | ICD-10-CM | POA: Insufficient documentation

## 2021-03-28 DIAGNOSIS — I10 Essential (primary) hypertension: Secondary | ICD-10-CM

## 2021-03-28 DIAGNOSIS — M5033 Other cervical disc degeneration, cervicothoracic region: Secondary | ICD-10-CM | POA: Insufficient documentation

## 2021-03-28 DIAGNOSIS — N183 Chronic kidney disease, stage 3 unspecified: Secondary | ICD-10-CM | POA: Insufficient documentation

## 2021-03-28 DIAGNOSIS — I129 Hypertensive chronic kidney disease with stage 1 through stage 4 chronic kidney disease, or unspecified chronic kidney disease: Secondary | ICD-10-CM | POA: Diagnosis not present

## 2021-03-28 DIAGNOSIS — Z951 Presence of aortocoronary bypass graft: Secondary | ICD-10-CM | POA: Insufficient documentation

## 2021-03-28 DIAGNOSIS — Z79899 Other long term (current) drug therapy: Secondary | ICD-10-CM | POA: Insufficient documentation

## 2021-03-28 LAB — CBC
HCT: 47 % (ref 39.0–52.0)
Hemoglobin: 16.2 g/dL (ref 13.0–17.0)
MCH: 31.2 pg (ref 26.0–34.0)
MCHC: 34.5 g/dL (ref 30.0–36.0)
MCV: 90.4 fL (ref 80.0–100.0)
Platelets: 198 10*3/uL (ref 150–400)
RBC: 5.2 MIL/uL (ref 4.22–5.81)
RDW: 12.5 % (ref 11.5–15.5)
WBC: 9.4 10*3/uL (ref 4.0–10.5)
nRBC: 0 % (ref 0.0–0.2)

## 2021-03-28 LAB — BASIC METABOLIC PANEL
Anion gap: 12 (ref 5–15)
BUN: 23 mg/dL (ref 8–23)
CO2: 19 mmol/L — ABNORMAL LOW (ref 22–32)
Calcium: 9.1 mg/dL (ref 8.9–10.3)
Chloride: 105 mmol/L (ref 98–111)
Creatinine, Ser: 1.6 mg/dL — ABNORMAL HIGH (ref 0.61–1.24)
GFR, Estimated: 43 mL/min — ABNORMAL LOW (ref >60)
Glucose, Bld: 106 mg/dL — ABNORMAL HIGH (ref 70–99)
Potassium: 4.2 mmol/L (ref 3.5–5.1)
Sodium: 136 mmol/L (ref 135–145)

## 2021-03-28 LAB — TROPONIN I (HIGH SENSITIVITY): Troponin I (High Sensitivity): 4 ng/L (ref <18)

## 2021-03-28 MED ORDER — HYDROCHLOROTHIAZIDE 25 MG PO TABS: 25.0000 mg | ORAL_TABLET | Freq: Every day | ORAL | 0 refills | Status: DC

## 2021-03-28 MED ORDER — FENTANYL CITRATE (PF) 100 MCG/2ML IJ SOLN
50.0000 ug | Freq: Once | INTRAMUSCULAR | Status: AC
  Administered 2021-03-28: 50 ug via INTRAVENOUS
  Filled 2021-03-28: qty 2

## 2021-03-28 MED ORDER — ONDANSETRON HCL 4 MG/2ML IJ SOLN
4.0000 mg | Freq: Once | INTRAMUSCULAR | Status: AC
  Administered 2021-03-28: 4 mg via INTRAVENOUS
  Filled 2021-03-28: qty 2

## 2021-03-28 MED ORDER — HYDROCHLOROTHIAZIDE 25 MG PO TABS
25.0000 mg | ORAL_TABLET | Freq: Once | ORAL | Status: AC
  Administered 2021-03-28: 25 mg via ORAL
  Filled 2021-03-28: qty 1

## 2021-03-28 MED ORDER — PREDNISONE 20 MG PO TABS
60.0000 mg | ORAL_TABLET | Freq: Once | ORAL | Status: AC
  Administered 2021-03-28: 60 mg via ORAL
  Filled 2021-03-28: qty 3

## 2021-03-28 MED ORDER — PREDNISONE 10 MG (21) PO TBPK: ORAL_TABLET | ORAL | 0 refills | Status: DC

## 2021-03-28 NOTE — ED Notes (Signed)
Cari NP notified of repeat vitals and request for pain medication.

## 2021-03-28 NOTE — ED Triage Notes (Signed)
Pt c/o R shoulder/neck/arm pain that pt noticed when he woke up this AM. Pt states artifical shoulder. Able to move arm some but limited. Hx gout but denies this feeling like gout. Denies swelling to shoulder/elbow/wrist. R arm is warm to touch. No redness noted. Denies injury. Cap refill less than 3 seconds. Strong radial pulse.   Pt states hx HTN but denies taking meds for it.

## 2021-03-28 NOTE — ED Notes (Addendum)
First Nurse Note: Pt ambulatory into ED c/o Right arm pain. Pt states that pain goes from his neck all the way down his arm. Pt wife states that pt is not able to move his arm due to pain, however pt visualized using both arms without difficulty.   Pt taken to triage by Jonny Ruiz EDT for EKG

## 2021-03-28 NOTE — ED Notes (Signed)
Patient transported to CT 

## 2021-03-28 NOTE — ED Provider Notes (Signed)
[facility] Emergency Department Provider Note ____________________________________________   Event Date/Time   First MD Initiated Contact with Patient 03/28/21 1828     (approximate)  I have reviewed the triage vital signs and the nursing notes.   HISTORY  Chief Complaint Arm Pain  HPI Bryan Welch is a 85 y.o. male with history of gout and other medical history as listed below presents to the emergency department for treatment and evaluation of right neck and shoulder pain that radiates down the right arm.  Symptoms started upon awakening.  Patient states that he sleeps on his right side and tucks his right hand under the pillow.  He had a shoulder replacement 1 year ago but has not had any radicular symptoms since.  He denies new injury.  He questions whether this may be gout.  No alleviating measures attempted prior to arrival.         Past Medical History:  Diagnosis Date  . Arthritis    Toes  . Coronary artery disease   . History of hiatal hernia   . Hyperlipidemia   . Hypertension   . Kidney disorder    "right kidney doesn't work"  . Presence of stent in artery    "4 in legs, 1 in neck"  . Vascular abnormality   . Wears dentures    full upper and lower    Patient Active Problem List   Diagnosis Date Noted  . Olecranon bursitis, right elbow 05/25/2020  . UPJ (ureteropelvic junction) obstruction 09/13/2018  . CRF (chronic renal failure), stage 3 (moderate) 07/25/2018  . Limb ischemia 09/20/2017  . Peripheral vascular disease (HCC) 04/05/2017  . AAA (abdominal aortic aneurysm) without rupture (HCC) 04/05/2017  . Hyperlipidemia 11/15/2016  . Benign essential hypertension 11/15/2016  . Carotid stenosis 11/15/2016    Past Surgical History:  Procedure Laterality Date  . BACK SURGERY    . CAROTID STENT Right   . CATARACT EXTRACTION W/PHACO Left 06/17/2019   Procedure: CATARACT EXTRACTION PHACO AND INTRAOCULAR LENS PLACEMENT (IOC)  LEFT;  Surgeon: Nevada Crane, MD;  Location: [facility];  Service: Ophthalmology;  Laterality: Left;  . CATARACT EXTRACTION W/PHACO Right 07/08/2019   Procedure: CATARACT EXTRACTION PHACO AND INTRAOCULAR LENS PLACEMENT (IOC) right  00:39.2  10.8%  5.02  ;  Surgeon: Nevada Crane, MD;  Location: [facility];  Service: Ophthalmology;  Laterality: Right;  . LEG SURGERY Bilateral    stent placement  . PERIPHERAL VASCULAR BALLOON ANGIOPLASTY Right 09/21/2017   Procedure: PERIPHERAL VASCULAR BALLOON ANGIOPLASTY;  Surgeon: Annice Needy, MD;  Location: [facility] INVASIVE CV LAB;  Service: Cardiovascular;  Laterality: Right;  . REVERSE SHOULDER ARTHROPLASTY Right 11/26/2019   Procedure: REVERSE SHOULDER ARTHROPLASTY;  Surgeon: Christena Flake, MD;  Location: [facility] ORS;  Service: Orthopedics;  Laterality: Right;    Prior to Admission medications   Medication Sig Start Date End Date Taking? Authorizing Provider  hydrochlorothiazide (HYDRODIURIL) 25 MG tablet Take 1 tablet (25 mg total) by mouth daily. 03/28/21  Yes Omere Marti B, FNP  predniSONE (STERAPRED UNI-PAK 21 TAB) 10 MG (21) TBPK tablet Take 6 tablets on the first day and decrease by 1 tablet each day until finished. 03/28/21  Yes Raju Coppolino B, FNP  acetaminophen (TYLENOL) 500 MG tablet Take 1,000 mg by mouth every 6 (six) hours as needed for moderate pain.     [provider]  ALPRAZolam Prudy Feeler) 0.25 MG tablet Take 0.25 mg by mouth at bedtime.  09/07/17   [provider]  amLODipine (NORVASC) 2.5 MG tablet Take 2.5 mg by mouth daily. Patient not taking: Reported on 03/16/2021 01/07/16   [provider]  aspirin EC 81 MG tablet Take 81 mg daily by mouth.     [provider]  Cholecalciferol 25 MCG (1000 UT) capsule Take 2,000 Units by mouth daily.    [provider]  diphenhydrAMINE (BENADRYL) 25 mg capsule Take 25 mg at bedtime by mouth.     [provider]  famotidine  (PEPCID) 10 MG tablet Take 10 mg by mouth daily.     [provider]  ferrous sulfate 325 (65 FE) MG tablet Take 325 mg by mouth daily with breakfast.    [provider]  gabapentin (NEURONTIN) 300 MG capsule Take by mouth. 10/23/20 10/23/21  [provider]  oxyCODONE (ROXICODONE) 5 MG immediate release tablet Take 1-2 tablets (5-10 mg total) by mouth every 4 (four) hours as needed for moderate pain or severe pain. 11/26/19   Poggi, Excell Seltzer, MD  pravastatin (PRAVACHOL) 20 MG tablet Take 20 mg daily by mouth.  08/30/16   [provider]  tamsulosin (FLOMAX) 0.4 MG CAPS capsule Take 0.4 mg by mouth daily.  01/06/18   [provider]  XARELTO 20 MG TABS tablet TAKE 1 TABLET BY MOUTH EVERY DAY *STOP ELIQUIS* 01/19/21   Georgiana Spinner, NP    Allergies Codeine  Family History  Problem Relation Age of Onset  . Cancer Mother   . Cancer Father     Social History Social History   Tobacco Use  . Smoking status: Former Smoker    Types: Cigarettes    Quit date: 2001    Years since quitting: 21.4  . Smokeless tobacco: Never Used  Vaping Use  . Vaping Use: Never used  Substance Use Topics  . Alcohol use: Yes    Alcohol/week: 3.0 standard drinks    Types: 3 Cans of beer per week    Comment: occasionally  . Drug use: No    Review of Systems  Constitutional: No fever/chills Eyes: No visual changes. ENT: No sore throat. Cardiovascular: Denies chest pain. Respiratory: Denies shortness of breath. Gastrointestinal: No abdominal pain.  No nausea, no vomiting.  No diarrhea.  No constipation. Genitourinary: Negative for dysuria. Musculoskeletal: Positive for radicular pain on the right upper extremity.  Positive for right lateral neck tenderness on palpation. Skin: Negative for rash. Neurological: Negative for headaches, focal weakness or numbness.  Grip strength is equal ____________________________________________   PHYSICAL EXAM:  VITAL  SIGNS: ED Triage Vitals  Enc Vitals Group     BP 03/28/21 1632 (!) 226/106     Pulse Rate 03/28/21 1632 82     Resp 03/28/21 1632 18     Temp 03/28/21 1632 98 F (36.7 C)     Temp Source 03/28/21 1632 Oral     SpO2 03/28/21 1632 98 %     Weight 03/28/21 1634 142 lb (64.4 kg)     Height 03/28/21 1634 5\' 6"  (1.676 m)     Head Circumference --      Peak Flow --      Pain Score 03/28/21 1634 6     Pain Loc --      Pain Edu? --      Excl. in GC? --     Constitutional: Alert and oriented. Well appearing and in no acute distress. Eyes: Conjunctivae are normal. PERRL. EOMI. Head: Atraumatic. Nose: No  congestion/rhinnorhea. Mouth/Throat: Mucous membranes are moist.  Oropharynx non-erythematous. Neck: No stridor.   Hematological/Lymphatic/Immunilogical: No cervical lymphadenopathy. Cardiovascular: Normal rate, regular rhythm. Grossly normal heart sounds.  Good peripheral circulation. Respiratory: Normal respiratory effort.  No retractions. Lungs CTAB. Gastrointestinal: Soft and nontender. No distention. No abdominal bruits. No CVA tenderness. Genitourinary:  Musculoskeletal: No lower extremity tenderness nor edema.  No joint effusions. Neurologic:  Normal speech and language. No gross focal neurologic deficits are appreciated. No gait instability. Skin:  Skin is warm, dry and intact. No rash noted. Psychiatric: Mood and affect are normal. Speech and behavior are normal.  ____________________________________________   LABS (all labs ordered are listed, but only abnormal results are displayed)  Labs Reviewed  BASIC METABOLIC PANEL - Abnormal; Notable for the following components:      Result Value   CO2 19 (*)    Glucose, Bld 106 (*)    Creatinine, Ser 1.60 (*)    GFR, Estimated 43 (*)    All other components within normal limits  CBC  TROPONIN I (HIGH SENSITIVITY)   ____________________________________________  EKG  Not  indicated ____________________________________________  RADIOLOGY  ED MD interpretation:    CT cervical spine without acute concerns. Denerative changes of the cervical spine most severe at C5-6.  I, Kem Boroughs, personally viewed and evaluated these images (plain radiographs) as part of my medical decision making, as well as reviewing the written report by the radiologist.  Official radiology report(s): CT Cervical Spine Wo Contrast  Result Date: 03/28/2021 CLINICAL DATA:  Neck pain and right upper extremity pain. No known injury. EXAM: CT CERVICAL SPINE WITHOUT CONTRAST TECHNIQUE: Multidetector CT imaging of the cervical spine was performed without intravenous contrast. Multiplanar CT image reconstructions were also generated. COMPARISON:  01/28/2013 FINDINGS: Alignment: 3 mm grade 1 anterolisthesis C7 on T1. Facet joints are aligned without dislocation or traumatic listhesis. Dens and lateral masses are aligned. Skull base and vertebrae: No acute fracture. No primary bone lesion or focal pathologic process. Soft tissues and spinal canal: No prevertebral fluid or swelling. No visible canal hematoma. Disc levels: Mild-to-moderate multilevel degenerative changes throughout the cervical spine with disc height loss and endplate spurring from C3-4 through C6-7. Bilateral facet and uncovertebral arthropathy. The right C3-4 facet joint is fused. Bony foraminal narrowing at multiple levels is most severe at C5-6, right worse than left. No evidence of high-grade canal stenosis by CT, although this is not well evaluated. Upper chest: Emphysematous changes within the lung apices. Biapical pleuroparenchymal scarring. Other: Atherosclerotic calcifications of the visualized aorta and arch vessels. Atherosclerotic calcifications throughout the courses of the bilateral carotid and vertebral arteries. IMPRESSION: 1. No acute fracture or traumatic listhesis of the cervical spine. 2. Mild-to-moderate multilevel  degenerative changes of the cervical spine. Bony foraminal narrowing at multiple levels is most severe at C5-6, right worse than left. No evidence of high-grade canal stenosis by CT, although this is not well evaluated. Aortic Atherosclerosis (ICD10-I70.0) and Emphysema (ICD10-J43.9). Electronically Signed   By: Duanne Guess D.O.   On: 03/28/2021 19:29    ____________________________________________   PROCEDURES  Procedure(s) performed (including Critical Care):  Procedures  ____________________________________________   INITIAL IMPRESSION / ASSESSMENT AND PLAN     85 year old male presenting to the emergency department for treatment and evaluation of right neck and shoulder radiculopathy into the right hand.  Patient states that he wonders if this is related to gout.  He states that he has had similar pain in his lower extremities with gout.  He  states that his primary care provider typically treats him with a prednisone taper which successfully reduces his pain.  DIFFERENTIAL DIAGNOSIS  Cervical radiculopathy; gout  ED COURSE  Remains hypertensive. He's also complaining of moderate to severe pain. Fentanyl and zofran ordered.  ----------------------------------------- 7:50 PM on 03/28/2021 -----------------------------------------  Will restart HCTZ and give Prednisone for radicular pain. He is to call and schedule a follow up with his primary care provider as soon as possible.    ___________________________________________   FINAL CLINICAL IMPRESSION(S) / ED DIAGNOSES  Final diagnoses:  Cervical radiculopathy  DDD (degenerative disc disease), cervical  Hypertension, unspecified type     ED Discharge Orders         Ordered    hydrochlorothiazide (HYDRODIURIL) 25 MG tablet  Daily        03/28/21 2005    predniSONE (STERAPRED UNI-PAK 21 TAB) 10 MG (21) TBPK tablet        03/28/21 2005           Pavlos Berenda MoraleS Sanzone was evaluated in Emergency Department on  03/28/2021 for the symptoms described in the history of present illness. He was evaluated in the context of the global COVID-19 pandemic, which necessitated consideration that the patient might be at risk for infection with the SARS-CoV-2 virus that causes COVID-19. Institutional protocols and algorithms that pertain to the evaluation of patients at risk for COVID-19 are in a state of rapid change based on information released by regulatory bodies including the CDC and federal and state organizations. These policies and algorithms were followed during the patient's care in the ED.   Note:  This document was prepared using Dragon voice recognition software and may include unintentional dictation errors.   Chinita Pesterriplett, Monte Bronder B, FNP 03/28/21 2013    Phineas SemenGoodman, [place], MD 03/28/21 2025

## 2021-03-28 NOTE — Discharge Instructions (Signed)
Call your doctor tomorrow to schedule a follow up appointment.  Return to the ER for symptoms of concern if unable to see primary care.

## 2021-08-09 ENCOUNTER — Telehealth (INDEPENDENT_AMBULATORY_CARE_PROVIDER_SITE_OTHER): Payer: Self-pay | Admitting: Vascular Surgery

## 2021-08-09 ENCOUNTER — Other Ambulatory Visit (INDEPENDENT_AMBULATORY_CARE_PROVIDER_SITE_OTHER): Payer: Self-pay | Admitting: Nurse Practitioner

## 2021-08-09 NOTE — Telephone Encounter (Signed)
Called patient to confirm  of app. Patient stated that it is only his right leg with pain and experiencing numbness in feet

## 2021-08-09 NOTE — Telephone Encounter (Signed)
Pt called in states that he is having trouble with his legs and feet. Went to urgent care 08/08/2021 provider at urgent care advised to see Dr.Dew  within a week for leg pain. Pt requesting to move up app from November. Pt is scheduled for AAA and to see Dr.Dew in November.Pt did not mention about stomach pain. Scheduled patient for an app tomorrow on 08/10/2021 at 10:00am. Vivia Birmingham would like to keep the 10:00am with Dew.

## 2021-08-10 ENCOUNTER — Encounter (INDEPENDENT_AMBULATORY_CARE_PROVIDER_SITE_OTHER): Payer: Self-pay | Admitting: Vascular Surgery

## 2021-08-10 ENCOUNTER — Ambulatory Visit (INDEPENDENT_AMBULATORY_CARE_PROVIDER_SITE_OTHER): Payer: Medicare Other | Admitting: Vascular Surgery

## 2021-08-10 ENCOUNTER — Other Ambulatory Visit: Payer: Self-pay

## 2021-08-10 VITALS — BP 132/68 | HR 75 | Ht 66.0 in | Wt 138.0 lb

## 2021-08-10 DIAGNOSIS — I10 Essential (primary) hypertension: Secondary | ICD-10-CM | POA: Diagnosis not present

## 2021-08-10 DIAGNOSIS — I70219 Atherosclerosis of native arteries of extremities with intermittent claudication, unspecified extremity: Secondary | ICD-10-CM

## 2021-08-10 DIAGNOSIS — I739 Peripheral vascular disease, unspecified: Secondary | ICD-10-CM | POA: Diagnosis not present

## 2021-08-10 DIAGNOSIS — I70211 Atherosclerosis of native arteries of extremities with intermittent claudication, right leg: Secondary | ICD-10-CM

## 2021-08-10 DIAGNOSIS — I6523 Occlusion and stenosis of bilateral carotid arteries: Secondary | ICD-10-CM | POA: Diagnosis not present

## 2021-08-10 DIAGNOSIS — I7143 Infrarenal abdominal aortic aneurysm, without rupture: Secondary | ICD-10-CM

## 2021-08-10 DIAGNOSIS — E785 Hyperlipidemia, unspecified: Secondary | ICD-10-CM

## 2021-08-10 HISTORY — DX: Atherosclerosis of native arteries of extremities with intermittent claudication, unspecified extremity: I70.219

## 2021-08-10 NOTE — Assessment & Plan Note (Signed)
Stable in terms of size earlier this year, but could be a source of peripheral embolization.  Were going to address of his occlusive disease first, and if his symptoms do not improve we may have to consider aneurysm repair to stop embolization.

## 2021-08-10 NOTE — H&P (View-Only) (Signed)
  MRN : [id number]  Zavon S Fallin is a 85 y.o. (12/18/1935) male who presents with chief complaint of  Chief Complaint  Patient presents with   Follow-up    Last seen on 5/22 went urgent care on 08/08/21 recommended to see JD with in the seek for Rt LE pain  .  History of Present Illness: Patient returns today in follow up of his worsening right leg pain.  He was last seen in our office about 5 months ago.  At that time he was stable.  Over the past 2 to 3 months, he has noticed worsening claudication symptoms in the right leg.  He reports no ulceration or infection.  He went to the urgent care last week because the leg was hurting more.  He had been treated for neuropathy but this seems to be getting worse.  He is also having more difficulty walking.  He usually walks many laps at the [employer] but says he is down to 1 lap because of pain in his right calf and lower leg.  He has some known PAD and his ABIs were stable in the 0.7-0.8 range at last check earlier this year.  He also has an abdominal aortic aneurysm which we have followed with duplex and was also stable at just over 4 cm earlier this year.  Current Outpatient Medications  Medication Sig Dispense Refill   acetaminophen (TYLENOL) 500 MG tablet Take 1,000 mg by mouth every 6 (six) hours as needed for moderate pain.      ALPRAZolam (XANAX) 0.25 MG tablet Take 0.25 mg by mouth at bedtime.      amLODipine (NORVASC) 2.5 MG tablet Take 2.5 mg by mouth daily.     aspirin EC 81 MG tablet Take 81 mg daily by mouth.      Cholecalciferol 25 MCG (1000 UT) capsule Take 2,000 Units by mouth daily.     diphenhydrAMINE (BENADRYL) 25 mg capsule Take 25 mg at bedtime by mouth.      famotidine (PEPCID) 10 MG tablet Take 10 mg by mouth daily.      ferrous sulfate 325 (65 FE) MG tablet Take 325 mg by mouth daily with breakfast.     gabapentin (NEURONTIN) 300 MG capsule Take by mouth.     hydrochlorothiazide (HYDRODIURIL) 25 MG tablet Take 1 tablet  (25 mg total) by mouth daily. 30 tablet 0   pravastatin (PRAVACHOL) 20 MG tablet Take 20 mg daily by mouth.      predniSONE (STERAPRED UNI-PAK 21 TAB) 10 MG (21) TBPK tablet Take 6 tablets on the first day and decrease by 1 tablet each day until finished. 21 tablet 0   tamsulosin (FLOMAX) 0.4 MG CAPS capsule Take 0.4 mg by mouth daily.   3   XARELTO 20 MG TABS tablet TAKE 1 TABLET BY MOUTH EVERY DAY *STOP ELIQUIS* 90 tablet 2   oxyCODONE (ROXICODONE) 5 MG immediate release tablet Take 1-2 tablets (5-10 mg total) by mouth every 4 (four) hours as needed for moderate pain or severe pain. (Patient not taking: Reported on 08/10/2021) 40 tablet 0   No current facility-administered medications for this visit.    Past Medical History:  Diagnosis Date   Arthritis    Toes   Coronary artery disease    History of hiatal hernia    Hyperlipidemia    Hypertension    Kidney disorder    "right kidney doesn't work"   Presence of stent in artery    "4 in   legs, 1 in neck"   Vascular abnormality    Wears dentures    full upper and lower    Past Surgical History:  Procedure Laterality Date   BACK SURGERY     CAROTID STENT Right    CATARACT EXTRACTION W/PHACO Left 06/17/2019   Procedure: CATARACT EXTRACTION PHACO AND INTRAOCULAR LENS PLACEMENT (IOC) LEFT;  Surgeon: Nevada Crane, MD;  Location: [facility];  Service: Ophthalmology;  Laterality: Left;   CATARACT EXTRACTION W/PHACO Right 07/08/2019   Procedure: CATARACT EXTRACTION PHACO AND INTRAOCULAR LENS PLACEMENT (IOC) right  00:39.2  10.8%  5.02  ;  Surgeon: Nevada Crane, MD;  Location: [facility];  Service: Ophthalmology;  Laterality: Right;   LEG SURGERY Bilateral    stent placement   PERIPHERAL VASCULAR BALLOON ANGIOPLASTY Right 09/21/2017   Procedure: PERIPHERAL VASCULAR BALLOON ANGIOPLASTY;  Surgeon: Annice Needy, MD;  Location: [facility] IN[facility];  Service: Cardiovascular;  Laterality: Right;   REVERSE SHOULDER  ARTHROPLASTY Right 11/26/2019   Procedure: REVERSE SHOULDER ARTHROPLASTY;  Surgeon: Christena Flake, MD;  Location: [facility] OR[facility];  Service: Orthopedics;  Laterality: Right;      Social History             Tobacco Use   Smoking status: Former Smoker      Types: Cigarettes      Quit date: 2001      Years since quitting: 20.8   Smokeless tobacco: Never Used  Building services engineer Use: Never used  Substance Use Topics   Alcohol use: Yes      Alcohol/week: 3.0 standard drinks      Types: 3 Cans of beer per week      Comment: occasionally   Drug use: No                 Family History  Problem Relation Age of Onset   Cancer Mother     Cancer Father    no bleeding or clotting disorders          Allergies  Allergen Reactions   Codeine Itching      REVIEW OF SYSTEMS (Negative unless checked)   Constitutional: [] Weight loss  [] Fever  [] Chills Cardiac: [] Chest pain   [] Chest pressure   [] Palpitations   [] Shortness of breath when laying flat   [] Shortness of breath at rest   [] Shortness of breath with exertion. Vascular:  [] Pain in legs with walking   [] Pain in legs at rest   [] Pain in legs when laying flat   [x] Claudication   [] Pain in feet when walking  [] Pain in feet at rest  [] Pain in feet when laying flat   [] History of DVT   [] Phlebitis   [] Swelling in legs   [] Varicose veins   [] Non-healing ulcers Pulmonary:   [] Uses home oxygen   [] Productive cough   [] Hemoptysis   [] Wheeze  [] COPD   [] Asthma Neurologic:  [] Dizziness  [] Blackouts   [] Seizures   [] History of stroke   [] History of TIA  [] Aphasia   [] Temporary blindness   [] Dysphagia   [] Weakness or numbness in arms   [] Weakness or numbness in legs Musculoskeletal:  [x] Arthritis   [] Joint swelling   [x] Joint pain   [] Low back pain Hematologic:  [] Easy bruising  [] Easy bleeding   [] Hypercoagulable state   [] Anemic   Gastrointestinal:  [] Blood in stool   [] Vomiting blood  [] Gastroesophageal reflux/heartburn   [] Abdominal  pain Genitourinary:  [] Chronic kidney disease   [] Difficult urination  []   Frequent urination  []Burning with urination   []Hematuria Skin:  []Rashes   []Ulcers   []Wounds Psychological:  []History of anxiety   [] History of major depression.      Physical Examination  BP 132/68   Pulse 75   Ht 5' 6" (1.676 m)   Wt 138 lb (62.6 kg)   BMI 22.27 kg/m  Gen:  WD/WN, NAD.  Appears younger than stated age Head: [place]/AT, No temporalis wasting. Ear/Nose/Throat: Hearing grossly intact, nares w/o erythema or drainage Eyes: Conjunctiva clear. Sclera non-icteric Neck: Supple.  Trachea midline Pulmonary:  Good air movement, no use of accessory muscles.  Cardiac: Irregular Vascular:  Vessel Right Left  Radial Palpable Palpable                          PT Trace palpable 1+ palpable  DP 1+ palpable 1+ palpable   Gastrointestinal: soft, non-tender/non-distended. No guarding/reflex.  Musculoskeletal: M/S 5/5 throughout.  No deformity or atrophy.  Slightly sluggish capillary refill in the right forefoot and toes.  No significant lower extremity edema. Neurologic: Sensation grossly intact in extremities.  Symmetrical.  Speech is fluent.  Psychiatric: Judgment intact, Mood & affect appropriate for pt's clinical situation. Dermatologic: No rashes or ulcers noted.  No cellulitis or open wounds.     Labs No results found for this or any previous visit (from the past 2160 hour(s)).  Radiology No results found.  Assessment/Plan Hyperlipidemia lipid control important in reducing the progression of atherosclerotic disease. Continue statin therapy     Essential hypertension, benign blood pressure control important in reducing the progression of atherosclerotic disease. On appropriate oral medications.  Carotid stenosis Stable disease checked earlier this year.  To be checked next year.  AAA (abdominal aortic aneurysm) without rupture (HCC) Stable in terms of size earlier this year, but  could be a source of peripheral embolization.  Were going to address of his occlusive disease first, and if his symptoms do not improve we may have to consider aneurysm repair to stop embolization.  Atherosclerosis of native arteries of extremity with intermittent claudication (HCC) Given his worsening claudication symptoms in the right leg as well as some concern of may be rest pain and potentially peripheral embolization, angiography with possible intervention will be planned.  He has some known PAD in both legs that was stable earlier this year, but his symptoms have progressed.  We will address his occlusive disease first.  If he still continues to have symptoms and we are concerned for peripheral embolization of his aneurysm, we will address that at a later date.  Risks and benefits of angiography with possible revascularization were discussed with the patient and he is agreeable to proceed.    Adelma Bowdoin, MD  08/10/2021 1:24 PM    This note was created with Dragon medical transcription system.  Any errors from dictation are purely unintentional  

## 2021-08-10 NOTE — Assessment & Plan Note (Signed)
Given his worsening claudication symptoms in the right leg as well as some concern of may be rest pain and potentially peripheral embolization, angiography with possible intervention will be planned.  He has some known PAD in both legs that was stable earlier this year, but his symptoms have progressed.  We will address his occlusive disease first.  If he still continues to have symptoms and we are concerned for peripheral embolization of his aneurysm, we will address that at a later date.  Risks and benefits of angiography with possible revascularization were discussed with the patient and he is agreeable to proceed.

## 2021-08-10 NOTE — Progress Notes (Signed)
MRN : [id number]  Bryan Welch is a 85 y.o. (1936-07-18) male who presents with chief complaint of  Chief Complaint  Patient presents with   Follow-up    Last seen on 5/22 went urgent care on 08/08/21 recommended to see JD with in the seek for Rt LE pain  .  History of Present Illness: Patient returns today in follow up of his worsening right leg pain.  He was last seen in our office about 5 months ago.  At that time he was stable.  Over the past 2 to 3 months, he has noticed worsening claudication symptoms in the right leg.  He reports no ulceration or infection.  He went to the urgent care last week because the leg was hurting more.  He had been treated for neuropathy but this seems to be getting worse.  He is also having more difficulty walking.  He usually walks many laps at the [facility] but says he is down to 1 lap because of pain in his right calf and lower leg.  He has some known PAD and his ABIs were stable in the 0.7-0.8 range at last check earlier this year.  He also has an abdominal aortic aneurysm which we have followed with duplex and was also stable at just over 4 cm earlier this year.  Current Outpatient Medications  Medication Sig Dispense Refill   acetaminophen (TYLENOL) 500 MG tablet Take 1,000 mg by mouth every 6 (six) hours as needed for moderate pain.      ALPRAZolam (XANAX) 0.25 MG tablet Take 0.25 mg by mouth at bedtime.      amLODipine (NORVASC) 2.5 MG tablet Take 2.5 mg by mouth daily.     aspirin EC 81 MG tablet Take 81 mg daily by mouth.      Cholecalciferol 25 MCG (1000 UT) capsule Take 2,000 Units by mouth daily.     diphenhydrAMINE (BENADRYL) 25 mg capsule Take 25 mg at bedtime by mouth.      famotidine (PEPCID) 10 MG tablet Take 10 mg by mouth daily.      ferrous sulfate 325 (65 FE) MG tablet Take 325 mg by mouth daily with breakfast.     gabapentin (NEURONTIN) 300 MG capsule Take by mouth.     hydrochlorothiazide (HYDRODIURIL) 25 MG tablet Take 1 tablet  (25 mg total) by mouth daily. 30 tablet 0   pravastatin (PRAVACHOL) 20 MG tablet Take 20 mg daily by mouth.      predniSONE (STERAPRED UNI-PAK 21 TAB) 10 MG (21) TBPK tablet Take 6 tablets on the first day and decrease by 1 tablet each day until finished. 21 tablet 0   tamsulosin (FLOMAX) 0.4 MG CAPS capsule Take 0.4 mg by mouth daily.   3   XARELTO 20 MG TABS tablet TAKE 1 TABLET BY MOUTH EVERY DAY *STOP ELIQUIS* 90 tablet 2   oxyCODONE (ROXICODONE) 5 MG immediate release tablet Take 1-2 tablets (5-10 mg total) by mouth every 4 (four) hours as needed for moderate pain or severe pain. (Patient not taking: Reported on 08/10/2021) 40 tablet 0   No current facility-administered medications for this visit.    Past Medical History:  Diagnosis Date   Arthritis    Toes   Coronary artery disease    History of hiatal hernia    Hyperlipidemia    Hypertension    Kidney disorder    "right kidney doesn't work"   Presence of stent in artery    "4 in  legs, 1 in neck"   Vascular abnormality    Wears dentures    full upper and lower    Past Surgical History:  Procedure Laterality Date   BACK SURGERY     CAROTID STENT Right    CATARACT EXTRACTION W/PHACO Left 06/17/2019   Procedure: CATARACT EXTRACTION PHACO AND INTRAOCULAR LENS PLACEMENT (IOC) LEFT;  Surgeon: Nevada Crane, MD;  Location: [facility];  Service: Ophthalmology;  Laterality: Left;   CATARACT EXTRACTION W/PHACO Right 07/08/2019   Procedure: CATARACT EXTRACTION PHACO AND INTRAOCULAR LENS PLACEMENT (IOC) right  00:39.2  10.8%  5.02  ;  Surgeon: Nevada Crane, MD;  Location: [facility];  Service: Ophthalmology;  Laterality: Right;   LEG SURGERY Bilateral    stent placement   PERIPHERAL VASCULAR BALLOON ANGIOPLASTY Right 09/21/2017   Procedure: PERIPHERAL VASCULAR BALLOON ANGIOPLASTY;  Surgeon: Annice Needy, MD;  Location: [facility];  Service: Cardiovascular;  Laterality: Right;   REVERSE SHOULDER  ARTHROPLASTY Right 11/26/2019   Procedure: REVERSE SHOULDER ARTHROPLASTY;  Surgeon: Christena Flake, MD;  Location: [facility];  Service: Orthopedics;  Laterality: Right;      Social History             Tobacco Use   Smoking status: Former Smoker      Types: Cigarettes      Quit date: 2001      Years since quitting: 20.8   Smokeless tobacco: Never Used  Building services engineer Use: Never used  Substance Use Topics   Alcohol use: Yes      Alcohol/week: 3.0 standard drinks      Types: 3 Cans of beer per week      Comment: occasionally   Drug use: No                 Family History  Problem Relation Age of Onset   Cancer Mother     Cancer Father    no bleeding or clotting disorders          Allergies  Allergen Reactions   Codeine Itching      REVIEW OF SYSTEMS (Negative unless checked)   Constitutional: [] Weight loss  [] Fever  [] Chills Cardiac: [] Chest pain   [] Chest pressure   [] Palpitations   [] Shortness of breath when laying flat   [] Shortness of breath at rest   [] Shortness of breath with exertion. Vascular:  [] Pain in legs with walking   [] Pain in legs at rest   [] Pain in legs when laying flat   [x] Claudication   [] Pain in feet when walking  [] Pain in feet at rest  [] Pain in feet when laying flat   [] History of DVT   [] Phlebitis   [] Swelling in legs   [] Varicose veins   [] Non-healing ulcers Pulmonary:   [] Uses home oxygen   [] Productive cough   [] Hemoptysis   [] Wheeze  [] COPD   [] Asthma Neurologic:  [] Dizziness  [] Blackouts   [] Seizures   [] History of stroke   [] History of TIA  [] Aphasia   [] Temporary blindness   [] Dysphagia   [] Weakness or numbness in arms   [] Weakness or numbness in legs Musculoskeletal:  [x] Arthritis   [] Joint swelling   [x] Joint pain   [] Low back pain Hematologic:  [] Easy bruising  [] Easy bleeding   [] Hypercoagulable state   [] Anemic   Gastrointestinal:  [] Blood in stool   [] Vomiting blood  [] Gastroesophageal reflux/heartburn   [] Abdominal  pain Genitourinary:  [] Chronic kidney disease   [] Difficult urination  []   Frequent urination  [] Burning with urination   [] Hematuria Skin:  [] Rashes   [] Ulcers   [] Wounds Psychological:  [] History of anxiety   []  History of major depression.      Physical Examination  BP 132/68   Pulse 75   Ht 5\' 6"  (1.676 m)   Wt 138 lb (62.6 kg)   BMI 22.27 kg/m  Gen:  WD/WN, NAD.  Appears younger than stated age Head: [place]/AT, No temporalis wasting. Ear/Nose/Throat: Hearing grossly intact, nares w/o erythema or drainage Eyes: Conjunctiva clear. Sclera non-icteric Neck: Supple.  Trachea midline Pulmonary:  Good air movement, no use of accessory muscles.  Cardiac: Irregular Vascular:  Vessel Right Left  Radial Palpable Palpable                          PT Trace palpable 1+ palpable  DP 1+ palpable 1+ palpable   Gastrointestinal: soft, non-tender/non-distended. No guarding/reflex.  Musculoskeletal: M/S 5/5 throughout.  No deformity or atrophy.  Slightly sluggish capillary refill in the right forefoot and toes.  No significant lower extremity edema. Neurologic: Sensation grossly intact in extremities.  Symmetrical.  Speech is fluent.  Psychiatric: Judgment intact, Mood & affect appropriate for pt's clinical situation. Dermatologic: No rashes or ulcers noted.  No cellulitis or open wounds.     Labs No results found for this or any previous visit (from the past 2160 hour(s)).  Radiology No results found.  Assessment/Plan Hyperlipidemia lipid control important in reducing the progression of atherosclerotic disease. Continue statin therapy     Essential hypertension, benign blood pressure control important in reducing the progression of atherosclerotic disease. On appropriate oral medications.  Carotid stenosis Stable disease checked earlier this year.  To be checked next year.  AAA (abdominal aortic aneurysm) without rupture (HCC) Stable in terms of size earlier this year, but  could be a source of peripheral embolization.  Were going to address of his occlusive disease first, and if his symptoms do not improve we may have to consider aneurysm repair to stop embolization.  Atherosclerosis of native arteries of extremity with intermittent claudication (HCC) Given his worsening claudication symptoms in the right leg as well as some concern of may be rest pain and potentially peripheral embolization, angiography with possible intervention will be planned.  He has some known PAD in both legs that was stable earlier this year, but his symptoms have progressed.  We will address his occlusive disease first.  If he still continues to have symptoms and we are concerned for peripheral embolization of his aneurysm, we will address that at a later date.  Risks and benefits of angiography with possible revascularization were discussed with the patient and he is agreeable to proceed.    , MD  08/10/2021 1:24 PM    This note was created with Dragon medical transcription system.  Any errors from dictation are purely unintentional

## 2021-08-11 ENCOUNTER — Encounter (INDEPENDENT_AMBULATORY_CARE_PROVIDER_SITE_OTHER): Payer: Self-pay

## 2021-08-11 NOTE — Telephone Encounter (Signed)
Patient called to check status of procedure being scheduled and to inform us that he has an upcomming appt on 08/16/21 at [facility] and didn't want surgery to interfere.

## 2021-08-11 NOTE — Telephone Encounter (Signed)
Patient is schedule for right lower extremity angio with Dr Wyn Quaker on 08/19/21 arrival time 10 am at the [facility]. Pre-procedure instructions were gone over with patient and will be mailed out

## 2021-08-18 ENCOUNTER — Other Ambulatory Visit (INDEPENDENT_AMBULATORY_CARE_PROVIDER_SITE_OTHER): Payer: Self-pay | Admitting: Vascular Surgery

## 2021-08-19 ENCOUNTER — Ambulatory Visit
Admission: RE | Admit: 2021-08-19 | Discharge: 2021-08-19 | Disposition: A | Discharge status: Home / Self Care | Payer: Medicare Other | Attending: Vascular Surgery | Admitting: Vascular Surgery

## 2021-08-19 ENCOUNTER — Encounter: Payer: Self-pay | Admitting: Certified Registered"

## 2021-08-19 ENCOUNTER — Encounter: Payer: Self-pay | Admitting: Vascular Surgery

## 2021-08-19 ENCOUNTER — Encounter
Admission: RE | Disposition: A | Discharge status: Home / Self Care | Payer: Self-pay | Source: Home / Self Care | Attending: Vascular Surgery

## 2021-08-19 DIAGNOSIS — I70219 Atherosclerosis of native arteries of extremities with intermittent claudication, unspecified extremity: Secondary | ICD-10-CM

## 2021-08-19 DIAGNOSIS — I2510 Atherosclerotic heart disease of native coronary artery without angina pectoris: Secondary | ICD-10-CM | POA: Diagnosis not present

## 2021-08-19 DIAGNOSIS — E785 Hyperlipidemia, unspecified: Secondary | ICD-10-CM | POA: Diagnosis not present

## 2021-08-19 DIAGNOSIS — I70221 Atherosclerosis of native arteries of extremities with rest pain, right leg: Secondary | ICD-10-CM | POA: Insufficient documentation

## 2021-08-19 DIAGNOSIS — I6529 Occlusion and stenosis of unspecified carotid artery: Secondary | ICD-10-CM | POA: Insufficient documentation

## 2021-08-19 DIAGNOSIS — Z7901 Long term (current) use of anticoagulants: Secondary | ICD-10-CM | POA: Insufficient documentation

## 2021-08-19 DIAGNOSIS — Z79899 Other long term (current) drug therapy: Secondary | ICD-10-CM | POA: Insufficient documentation

## 2021-08-19 DIAGNOSIS — Z7982 Long term (current) use of aspirin: Secondary | ICD-10-CM | POA: Insufficient documentation

## 2021-08-19 DIAGNOSIS — Z885 Allergy status to narcotic agent status: Secondary | ICD-10-CM | POA: Diagnosis not present

## 2021-08-19 DIAGNOSIS — Z87891 Personal history of nicotine dependence: Secondary | ICD-10-CM | POA: Insufficient documentation

## 2021-08-19 DIAGNOSIS — I714 Abdominal aortic aneurysm, without rupture, unspecified: Secondary | ICD-10-CM | POA: Diagnosis not present

## 2021-08-19 DIAGNOSIS — I10 Essential (primary) hypertension: Secondary | ICD-10-CM | POA: Diagnosis not present

## 2021-08-19 HISTORY — PX: LOWER EXTREMITY ANGIOGRAPHY: CATH118251

## 2021-08-19 LAB — CREATININE, SERUM
Creatinine, Ser: 1.96 mg/dL — ABNORMAL HIGH (ref 0.61–1.24)
GFR, Estimated: 34 mL/min — ABNORMAL LOW (ref >60)

## 2021-08-19 LAB — BUN: BUN: 45 mg/dL — ABNORMAL HIGH (ref 8–23)

## 2021-08-19 SURGERY — LOWER EXTREMITY ANGIOGRAPHY
Anesthesia: Moderate Sedation | Site: Leg Lower | Laterality: Right

## 2021-08-19 MED ORDER — HEPARIN SODIUM (PORCINE) 1000 UNIT/ML IJ SOLN
INTRAMUSCULAR | Status: AC
  Filled 2021-08-19: qty 1

## 2021-08-19 MED ORDER — MIDAZOLAM HCL 2 MG/ML PO SYRP: 8.0000 mg | ORAL_SOLUTION | Freq: Once | ORAL | Status: DC | PRN

## 2021-08-19 MED ORDER — HYDROMORPHONE HCL 1 MG/ML IJ SOLN: 1.0000 mg | Freq: Once | INTRAMUSCULAR | Status: DC | PRN

## 2021-08-19 MED ORDER — FAMOTIDINE 20 MG PO TABS: 40.0000 mg | ORAL_TABLET | Freq: Once | ORAL | Status: DC | PRN

## 2021-08-19 MED ORDER — NITROGLYCERIN 1 MG/10 ML FOR IR/CATH LAB
INTRA_ARTERIAL | Status: DC | PRN
  Administered 2021-08-19: 300 ug via INTRA_ARTERIAL
  Administered 2021-08-19: 200 ug via INTRA_ARTERIAL

## 2021-08-19 MED ORDER — DIPHENHYDRAMINE HCL 50 MG/ML IJ SOLN: 50.0000 mg | Freq: Once | INTRAMUSCULAR | Status: DC | PRN

## 2021-08-19 MED ORDER — SODIUM CHLORIDE 0.9 % IV BOLUS: 250.0000 mL | Freq: Once | INTRAVENOUS | Status: DC

## 2021-08-19 MED ORDER — MIDAZOLAM HCL 5 MG/5ML IJ SOLN
INTRAMUSCULAR | Status: AC
  Filled 2021-08-19: qty 5

## 2021-08-19 MED ORDER — SODIUM CHLORIDE 0.9 % IV SOLN: Freq: Once | INTRAVENOUS | Status: DC

## 2021-08-19 MED ORDER — HEPARIN SODIUM (PORCINE) 1000 UNIT/ML IJ SOLN
INTRAMUSCULAR | Status: DC | PRN
  Administered 2021-08-19: 5000 [IU] via INTRAVENOUS

## 2021-08-19 MED ORDER — SODIUM CHLORIDE 0.9 % IV SOLN: INTRAVENOUS | Status: DC

## 2021-08-19 MED ORDER — MIDAZOLAM HCL 2 MG/2ML IJ SOLN
INTRAMUSCULAR | Status: DC | PRN
  Administered 2021-08-19: 2 mg via INTRAVENOUS
  Administered 2021-08-19 (×2): 1 mg via INTRAVENOUS

## 2021-08-19 MED ORDER — FENTANYL CITRATE PF 50 MCG/ML IJ SOSY
PREFILLED_SYRINGE | INTRAMUSCULAR | Status: AC
  Filled 2021-08-19: qty 2

## 2021-08-19 MED ORDER — NITROGLYCERIN 1 MG/10 ML FOR IR/CATH LAB
INTRA_ARTERIAL | Status: AC
  Filled 2021-08-19: qty 10

## 2021-08-19 MED ORDER — FENTANYL CITRATE (PF) 100 MCG/2ML IJ SOLN
INTRAMUSCULAR | Status: DC | PRN
  Administered 2021-08-19 (×2): 25 ug via INTRAVENOUS
  Administered 2021-08-19: 50 ug via INTRAVENOUS

## 2021-08-19 MED ORDER — CEFAZOLIN SODIUM-DEXTROSE 2-4 GM/100ML-% IV SOLN
2.0000 g | Freq: Once | INTRAVENOUS | Status: AC
  Administered 2021-08-19: 2 g via INTRAVENOUS

## 2021-08-19 MED ORDER — ONDANSETRON HCL 4 MG/2ML IJ SOLN: 4.0000 mg | Freq: Four times a day (QID) | INTRAMUSCULAR | Status: DC | PRN

## 2021-08-19 MED ORDER — METHYLPREDNISOLONE SODIUM SUCC 125 MG IJ SOLR: 125.0000 mg | Freq: Once | INTRAMUSCULAR | Status: DC | PRN

## 2021-08-19 SURGICAL SUPPLY — 29 items
BALLN DORADO 6X200X135 (BALLOONS) ×2
BALLN LUTONIX 018 4X100X130 (BALLOONS) ×2
BALLN LUTONIX 018 5X150X130 (BALLOONS) ×2
BALLN LUTONIX 018 5X300X130 (BALLOONS) ×2
BALLOON DORADO 6X200X135 (BALLOONS) ×1 IMPLANT
BALLOON LUTONIX 018 4X100X130 (BALLOONS) ×1 IMPLANT
BALLOON LUTONIX 018 5X150X130 (BALLOONS) ×1 IMPLANT
BALLOON LUTONIX 018 5X300X130 (BALLOONS) ×1 IMPLANT
CANISTER PENUMBRA ENGINE (MISCELLANEOUS) ×2 IMPLANT
CANNULA 5F STIFF (CANNULA) ×2 IMPLANT
CATH ANGIO 5F PIGTAIL 65CM (CATHETERS) ×2 IMPLANT
CATH BEACON 5 .038 100 VERT TP (CATHETERS) ×2 IMPLANT
CATH INDIGO CAT3 KIT (CATHETERS) ×2 IMPLANT
CATH ROTAREX 135 6FR (CATHETERS) ×2 IMPLANT
CATH TEMPO 5F RIM 65CM (CATHETERS) ×2 IMPLANT
COVER PROBE U/S 5X48 (MISCELLANEOUS) ×2 IMPLANT
DEVICE STARCLOSE SE CLOSURE (Vascular Products) ×2 IMPLANT
GLIDEWIRE ADV .035X260CM (WIRE) ×2 IMPLANT
GUIDEWIRE SUPER STIFF .035X180 (WIRE) ×2 IMPLANT
KIT ENCORE 26 ADVANTAGE (KITS) ×2 IMPLANT
PACK ANGIOGRAPHY (CUSTOM PROCEDURE TRAY) ×2 IMPLANT
SHEATH BRITE TIP 5FRX11 (SHEATH) ×2 IMPLANT
SHEATH HIGHFLEX ANSEL 6FRX55 (SHEATH) ×2 IMPLANT
SHEATH RAABE 6FR (SHEATH) ×2 IMPLANT
STENT VIABAHN 6X250X120 (Permanent Stent) ×2 IMPLANT
SYR MEDRAD MARK 7 150ML (SYRINGE) ×2 IMPLANT
TUBING CONTRAST HIGH PRESS 72 (TUBING) ×2 IMPLANT
WIRE G V18X300CM (WIRE) ×2 IMPLANT
WIRE GUIDERIGHT .035X150 (WIRE) ×2 IMPLANT

## 2021-08-19 NOTE — Interval H&P Note (Signed)
History and Physical Interval Note:  08/19/2021 9:51 AM  Bryan Welch  has presented today for surgery, with the diagnosis of RLE Angio   BARD    ASO w claudication.  The various methods of treatment have been discussed with the patient and family. After consideration of risks, benefits and other options for treatment, the patient has consented to  Procedure(s): LOWER EXTREMITY ANGIOGRAPHY (Right) as a surgical intervention.  [name] history has been reviewed, patient examined, no change in status, stable for surgery.  I have reviewed the patient's chart and labs.  Questions were answered to the patient's satisfaction.     Festus Barren

## 2021-08-19 NOTE — Op Note (Signed)
[facility]  Percutaneous Study/Intervention Procedural Note   Date of Surgery: 08/19/2021  Surgeon(s):Arriel Victor    Assistants:none  Pre-operative Diagnosis: PAD with claudication and early rest pain right foot  Post-operative diagnosis:  Same  Procedure(s) Performed:             1.  Ultrasound guidance for vascular access left femoral artery             2.  Catheter placement into right common femoral artery from left femoral approach             3.  Aortogram and selective right lower extremity angiogram             4.  Mechanical thrombectomy of the right SFA and popliteal arteries with the [place] Rex device             5.  Percutaneous transluminal angioplasty of the right tibioperoneal trunk and popliteal artery with 4 mm diameter Lutonix drug-coated angioplasty balloon  6.  Percutaneous transluminal angioplasty of the right popliteal artery and SFA with 5 mm diameter Lutonix drug-coated angioplasty balloon  7.  Viabahn stent placement to the right SFA and popliteal arteries with 6 mm diameter by 25 cm length Viabahn stent for residual stenosis and thrombus after above procedures  8.  Mechanical thrombectomy of the right peroneal artery with the CAT 3 device             9.  StarClose closure device left femoral artery  EBL: 100 cc  Contrast: 55 cc  Fluoro Time: 14.9 minutes  Moderate Conscious Sedation Time: approximately 68 minutes using 4 mg of Versed and 100 mcg of Fentanyl              Indications:  Patient is a 85 y.o.male with worsening claudication symptoms on the right foot and now with symptoms worrisome for rest pain. The patient has noninvasive study showing a drop in his right ABI with a known history of peripheral arterial disease. The patient is brought in for angiography for further evaluation and potential treatment.  Due to the limb threatening nature of the situation, angiogram was performed for attempted limb salvage. The patient is  aware that if the procedure fails, amputation would be expected.  The patient also understands that even with successful revascularization, amputation may still be required due to the severity of the situation.  Risks and benefits are discussed and informed consent is obtained.   Procedure:  The patient was identified and appropriate procedural time out was performed.  The patient was then placed supine on the table and prepped and draped in the usual sterile fashion. Moderate conscious sedation was administered during a face to face encounter with the patient throughout the procedure with my supervision of the RN administering medicines and monitoring the patient's vital signs, pulse oximetry, telemetry and mental status throughout from the start of the procedure until the patient was taken to the recovery room. Ultrasound was used to evaluate the left common femoral artery.  It was patent .  A digital ultrasound image was acquired.  A Seldinger needle was used to access the left common femoral artery under direct ultrasound guidance and a permanent image was performed.  A 0.035 J wire was advanced without resistance and a 5Fr sheath was placed.  Pigtail catheter was placed into the aorta and an AP aortogram was performed. This demonstrated a known abdominal aortic aneurysm.  Renal arteries appear to be patent.  There is  least a moderate stenosis at the origin of the right common iliac artery in the 60 to 70% range, but this would not be amenable to any treatment without concomitantly fixing the aneurysm superiorly.  The right external iliac artery was patent including the previously placed stent.  The left common and external iliac artery did not appear to have any significant stenosis but the left hypogastric artery was occluded. I then crossed the aortic bifurcation and advanced to the right femoral head. Selective right lower extremity angiogram was then performed. This demonstrated fairly normal common  femoral artery, profunda femoris artery, and the proximal portion of the superficial femoral artery.  In the mid to distal superficial femoral artery a few centimeters above the previously placed stent there was an occlusion and this occlusion continued down to the tibioperoneal trunk.  The peroneal artery was the only runoff distally and distal opacification was fairly faint due to the sluggish flow. It was felt that it was in the patient's best interest to proceed with intervention after these images to avoid a second procedure and a larger amount of contrast and fluoroscopy based off of the findings from the initial angiogram. The patient was systemically heparinized and a 6 French long sheath was then placed over the Terumo Advantage wire. I then used a Kumpe catheter and the advantage wire to easily navigate through the occlusion and down into the tibioperoneal trunk were then exchanged for a V 18 wire which was taken into the peroneal artery distally.  I began by using the [place] Rex device to debulk the chronic thrombus in the right SFA and popliteal arteries.  The wire and catheter crossed very easily with no resistance and this appeared to be a more subacute on chronic situation.  After 2 passes with the [place] Rex device, there was now a channel but there remained multiple areas residual stenosis and thrombus throughout.  A 4 mm diameter by 10 cm length Lutonix drug-coated angioplasty balloon was inflated in the tibioperoneal trunk the below-knee popliteal artery and taken to 6 atm for 1 minute.  2 inflations with a 5 mm diameter by 15 cm length Lutonix drug-coated angioplasty balloon was taken from the mid to distal SFA down to the below-knee popliteal artery.  Both inflations were 8 to 10 atm for a minute.  Completion imaging showed a high-grade residual stenosis both proximally and distally at the edge of the previously placed stents as well as thrombus now in the peroneal artery distally.  I addressed the  SFA and popliteal disease with a 6 mm diameter by 25 cm length Viabahn stent from the most distal popliteal artery up to the mid to distal SFA.  This was postdilated with 5 and 6 mm balloons.  Completion imaging showed no greater than 10 to 15% residual stenosis in the SFA and popliteal arteries, but there remained thrombus in the mid peroneal artery.  I then selected the penumbra CAT 3 device and made 2 passes in the right peroneal artery with restoration of flow and resolution of the thrombus after mechanical thrombectomy.  There was significant spasm distally but after treatment with intra-arterial nitroglycerin he appeared to have intact distal flow. I elected to terminate the procedure. The sheath was removed and StarClose closure device was deployed in the left femoral artery with excellent hemostatic result. The patient was taken to the recovery room in stable condition having tolerated the procedure well.  Findings:  Aortogram: Known abdominal aortic aneurysm.  Renal arteries appear to be patent.  There is least a moderate stenosis at the origin of the right common iliac artery in the 60 to 70% range, but this would not be amenable to any treatment without concomitantly fixing the aneurysm superiorly.  The right external iliac artery was patent including the previously placed stent.  The left common and external iliac artery did not appear to have any significant stenosis but the left hypogastric artery was occluded.             Right Lower Extremity:  This demonstrated fairly normal common femoral artery, profunda femoris artery, and the proximal portion of the superficial femoral artery.  In the mid to distal superficial femoral artery a few centimeters above the previously placed stent there was an occlusion and this occlusion continued down to the tibioperoneal trunk.  The peroneal artery was the only runoff distally and distal opacification was fairly faint due to the sluggish  flow.   Disposition: Patient was taken to the recovery room in stable condition having tolerated the procedure well.  Complications: None  [name] 08/19/2021 12:36 PM   This note was created with Dragon Medical transcription system. Any errors in dictation are purely unintentional.

## 2021-08-20 ENCOUNTER — Telehealth (INDEPENDENT_AMBULATORY_CARE_PROVIDER_SITE_OTHER): Payer: Self-pay | Admitting: Vascular Surgery

## 2021-08-20 ENCOUNTER — Other Ambulatory Visit (INDEPENDENT_AMBULATORY_CARE_PROVIDER_SITE_OTHER): Payer: Self-pay | Admitting: Nurse Practitioner

## 2021-08-20 MED ORDER — TRAMADOL HCL 50 MG PO TABS: 50.0000 mg | ORAL_TABLET | Freq: Four times a day (QID) | ORAL | 0 refills | Status: DC | PRN

## 2021-08-20 NOTE — Telephone Encounter (Signed)
Called stating that he is experiencing discomfort in rle where he had rle angio done yesterday (JD). Patient wanted advice on what he can do for relief. Patient also stated that his pain level (1-10 is 8) and is interested in pain relief if possible. Please advise.

## 2021-08-20 NOTE — Telephone Encounter (Signed)
Sent!

## 2021-08-20 NOTE — Telephone Encounter (Signed)
Made patient aware Pharmacy [facility] [place], [place]

## 2021-08-20 NOTE — Telephone Encounter (Signed)
We can send in tramadol, verify pharmacy please

## 2021-08-24 ENCOUNTER — Emergency Department
Admission: EM | Admit: 2021-08-24 | Discharge: 2021-08-24 | Disposition: A | Discharge status: Home / Self Care | Payer: Medicare Other | Attending: Emergency Medicine | Admitting: Emergency Medicine

## 2021-08-24 ENCOUNTER — Other Ambulatory Visit: Payer: Self-pay

## 2021-08-24 ENCOUNTER — Encounter: Payer: Self-pay | Admitting: Emergency Medicine

## 2021-08-24 DIAGNOSIS — R110 Nausea: Secondary | ICD-10-CM | POA: Insufficient documentation

## 2021-08-24 DIAGNOSIS — Z87891 Personal history of nicotine dependence: Secondary | ICD-10-CM | POA: Diagnosis not present

## 2021-08-24 DIAGNOSIS — I129 Hypertensive chronic kidney disease with stage 1 through stage 4 chronic kidney disease, or unspecified chronic kidney disease: Secondary | ICD-10-CM | POA: Insufficient documentation

## 2021-08-24 DIAGNOSIS — Z7982 Long term (current) use of aspirin: Secondary | ICD-10-CM | POA: Diagnosis not present

## 2021-08-24 DIAGNOSIS — M109 Gout, unspecified: Secondary | ICD-10-CM | POA: Diagnosis not present

## 2021-08-24 DIAGNOSIS — Z96611 Presence of right artificial shoulder joint: Secondary | ICD-10-CM | POA: Insufficient documentation

## 2021-08-24 DIAGNOSIS — I2510 Atherosclerotic heart disease of native coronary artery without angina pectoris: Secondary | ICD-10-CM | POA: Insufficient documentation

## 2021-08-24 DIAGNOSIS — Z7901 Long term (current) use of anticoagulants: Secondary | ICD-10-CM | POA: Insufficient documentation

## 2021-08-24 DIAGNOSIS — M79671 Pain in right foot: Secondary | ICD-10-CM

## 2021-08-24 DIAGNOSIS — N183 Chronic kidney disease, stage 3 unspecified: Secondary | ICD-10-CM | POA: Insufficient documentation

## 2021-08-24 DIAGNOSIS — Z79899 Other long term (current) drug therapy: Secondary | ICD-10-CM | POA: Diagnosis not present

## 2021-08-24 LAB — CBC WITH DIFFERENTIAL/PLATELET
Abs Immature Granulocytes: 0.08 10*3/uL — ABNORMAL HIGH (ref 0.00–0.07)
Basophils Absolute: 0.1 10*3/uL (ref 0.0–0.1)
Basophils Relative: 1 %
Eosinophils Absolute: 0.2 10*3/uL (ref 0.0–0.5)
Eosinophils Relative: 2 %
HCT: 44.2 % (ref 39.0–52.0)
Hemoglobin: 15.3 g/dL (ref 13.0–17.0)
Immature Granulocytes: 1 %
Lymphocytes Relative: 8 %
Lymphs Abs: 0.9 10*3/uL (ref 0.7–4.0)
MCH: 30.4 pg (ref 26.0–34.0)
MCHC: 34.6 g/dL (ref 30.0–36.0)
MCV: 87.7 fL (ref 80.0–100.0)
Monocytes Absolute: 0.7 10*3/uL (ref 0.1–1.0)
Monocytes Relative: 7 %
Neutro Abs: 8.9 10*3/uL — ABNORMAL HIGH (ref 1.7–7.7)
Neutrophils Relative %: 81 %
Platelets: 221 10*3/uL (ref 150–400)
RBC: 5.04 MIL/uL (ref 4.22–5.81)
RDW: 12.1 % (ref 11.5–15.5)
WBC: 10.9 10*3/uL — ABNORMAL HIGH (ref 4.0–10.5)
nRBC: 0 % (ref 0.0–0.2)

## 2021-08-24 LAB — BASIC METABOLIC PANEL
Anion gap: 13 (ref 5–15)
BUN: 33 mg/dL — ABNORMAL HIGH (ref 8–23)
CO2: 28 mmol/L (ref 22–32)
Calcium: 9.5 mg/dL (ref 8.9–10.3)
Chloride: 91 mmol/L — ABNORMAL LOW (ref 98–111)
Creatinine, Ser: 1.94 mg/dL — ABNORMAL HIGH (ref 0.61–1.24)
GFR, Estimated: 34 mL/min — ABNORMAL LOW (ref >60)
Glucose, Bld: 146 mg/dL — ABNORMAL HIGH (ref 70–99)
Potassium: 3.6 mmol/L (ref 3.5–5.1)
Sodium: 132 mmol/L — ABNORMAL LOW (ref 135–145)

## 2021-08-24 LAB — URIC ACID: Uric Acid, Serum: 10.4 mg/dL — ABNORMAL HIGH (ref 3.7–8.6)

## 2021-08-24 MED ORDER — PREDNISONE 10 MG PO TABS: ORAL_TABLET | ORAL | 0 refills | Status: DC

## 2021-08-24 NOTE — Discharge Instructions (Addendum)
Please seek medical attention for any high fevers, chest pain, shortness of breath, change in behavior, persistent vomiting, bloody stool or any other new or concerning symptoms.  

## 2021-08-24 NOTE — ED Triage Notes (Signed)
Pt to ED via POV with complaints of right foot pain. Pt had vascular surgery on it on Thursday with Dr. Wyn Quaker. Pt noticed that his right first digit on his foot is res and swollen yesterday and today it is starting to spread to his 2nd digit. He does have history of Gout

## 2021-08-24 NOTE — ED Provider Notes (Signed)
[facility] Emergency Department Provider Note   ____________________________________________   I have reviewed the triage vital signs and the nursing notes.   HISTORY  Chief Complaint Foot Pain   History limited by: Not Limited   HPI Bryan Welch is a 85 y.o. male who presents to the emergency department today because of concern for right foot pain. The patient states that the pain started 4 days ago. Located at the base of his right great toe. He has also noticed some redness and swelling. Does have history of gout and states that this feels similar to him to his previous gout flares. The patient did have a vascular surgery procedure to his right leg the day before his toe symptoms started. The patient denies any fevers. Did have some nausea one day but attributed that to a tramadol he tried taking.    Records reviewed. Per medical record review patient has a history of HLD, HTN.  Past Medical History:  Diagnosis Date   Arthritis    Toes   Coronary artery disease    History of hiatal hernia    Hyperlipidemia    Hypertension    Kidney disorder    "right kidney doesn't work"   Presence of stent in artery    "4 in legs, 1 in neck"   Vascular abnormality    Wears dentures    full upper and lower    Patient Active Problem List   Diagnosis Date Noted   Atherosclerosis of native arteries of extremity with intermittent claudication (HCC) 08/10/2021   Olecranon bursitis, right elbow 05/25/2020   UPJ (ureteropelvic junction) obstruction 09/13/2018   CRF (chronic renal failure), stage 3 (moderate) 07/25/2018   Limb ischemia 09/20/2017   Peripheral vascular disease (HCC) 04/05/2017   AAA (abdominal aortic aneurysm) without rupture 04/05/2017   Hyperlipidemia 11/15/2016   Benign essential hypertension 11/15/2016   Carotid stenosis 11/15/2016    Past Surgical History:  Procedure Laterality Date   BACK SURGERY     CAROTID STENT Right     CATARACT EXTRACTION W/PHACO Left 06/17/2019   Procedure: CATARACT EXTRACTION PHACO AND INTRAOCULAR LENS PLACEMENT (IOC) LEFT;  Surgeon: Nevada Crane, MD;  Location: [facility];  Service: Ophthalmology;  Laterality: Left;   CATARACT EXTRACTION W/PHACO Right 07/08/2019   Procedure: CATARACT EXTRACTION PHACO AND INTRAOCULAR LENS PLACEMENT (IOC) right  00:39.2  10.8%  5.02  ;  Surgeon: Nevada Crane, MD;  Location: [facility];  Service: Ophthalmology;  Laterality: Right;   LEG SURGERY Bilateral    stent placement   LOWER EXTREMITY ANGIOGRAPHY Right 08/19/2021   Procedure: LOWER EXTREMITY ANGIOGRAPHY;  Surgeon: Annice Needy, MD;  Location: [facility];  Service: Cardiovascular;  Laterality: Right;   PERIPHERAL VASCULAR BALLOON ANGIOPLASTY Right 09/21/2017   Procedure: PERIPHERAL VASCULAR BALLOON ANGIOPLASTY;  Surgeon: Annice Needy, MD;  Location: [facility];  Service: Cardiovascular;  Laterality: Right;   REVERSE SHOULDER ARTHROPLASTY Right 11/26/2019   Procedure: REVERSE SHOULDER ARTHROPLASTY;  Surgeon: Christena Flake, MD;  Location: [facility];  Service: Orthopedics;  Laterality: Right;    Prior to Admission medications   Medication Sig Start Date End Date Taking? Authorizing Provider  acetaminophen (TYLENOL) 500 MG tablet Take 1,000 mg by mouth every 6 (six) hours as needed for moderate pain.     [provider]  ALPRAZolam Prudy Feeler) 0.25 MG tablet Take 0.25 mg by mouth at bedtime.  09/07/17   [provider]  amLODipine (  NORVASC) 2.5 MG tablet Take 2.5 mg by mouth daily. 01/07/16   [provider]  aspirin EC 81 MG tablet Take 81 mg daily by mouth.     [provider]  Cholecalciferol 25 MCG (1000 UT) capsule Take 2,000 Units by mouth daily.    [provider]  diphenhydrAMINE (BENADRYL) 25 mg capsule Take 25 mg at bedtime by mouth.     [provider]  famotidine (PEPCID) 10 MG tablet Take 10 mg by mouth  daily.     [provider]  ferrous sulfate 325 (65 FE) MG tablet Take 325 mg by mouth daily with breakfast.    [provider]  gabapentin (NEURONTIN) 300 MG capsule Take by mouth. 10/23/20 10/23/21  [provider]  hydrochlorothiazide (HYDRODIURIL) 25 MG tablet Take 1 tablet (25 mg total) by mouth daily. 03/28/21   Triplett, Rulon Eisenmenger B, FNP  pravastatin (PRAVACHOL) 20 MG tablet Take 20 mg daily by mouth.  08/30/16   [provider]  predniSONE (STERAPRED UNI-PAK 21 TAB) 10 MG (21) TBPK tablet Take 6 tablets on the first day and decrease by 1 tablet each day until finished. 03/28/21   Triplett, Cari B, FNP  tamsulosin (FLOMAX) 0.4 MG CAPS capsule Take 0.4 mg by mouth daily.  01/06/18   [provider]  traMADol (ULTRAM) 50 MG tablet Take 1 tablet (50 mg total) by mouth every 6 (six) hours as needed. 08/20/21   Georgiana Spinner, NP  XARELTO 20 MG TABS tablet TAKE 1 TABLET BY MOUTH EVERY DAY *STOP ELIQUIS* 01/19/21   Georgiana Spinner, NP    Allergies Codeine  Family History  Problem Relation Age of Onset   Cancer Mother    Cancer Father     Social History Social History   Tobacco Use   Smoking status: Former    Types: Cigarettes    Quit date: 2001    Years since quitting: 21.8   Smokeless tobacco: Never  Vaping Use   Vaping Use: Never used  Substance Use Topics   Alcohol use: Yes    Alcohol/week: 3.0 standard drinks    Types: 3 Cans of beer per week    Comment: occasionally   Drug use: No    Review of Systems Constitutional: No fever/chills Eyes: No visual changes. ENT: No sore throat. Cardiovascular: Denies chest pain. Respiratory: Denies shortness of breath. Gastrointestinal: No abdominal pain.  No nausea, no vomiting.  No diarrhea.   Genitourinary: Negative for dysuria. Musculoskeletal: Positive for right foot pain. Skin: Negative for rash. Neurological: Negative for headaches, focal weakness or  numbness.  ____________________________________________   PHYSICAL EXAM:  VITAL SIGNS: ED Triage Vitals  Enc Vitals Group     BP 08/24/21 1213 128/61     Pulse Rate 08/24/21 1213 91     Resp 08/24/21 1213 16     Temp 08/24/21 1213 98.8 F (37.1 C)     Temp Source 08/24/21 1213 Oral     SpO2 08/24/21 1213 97 %     Weight 08/24/21 1217 129 lb (58.5 kg)     Height 08/24/21 1217 5\' 6"  (1.676 m)     Head Circumference --      Peak Flow --      Pain Score 08/24/21 1217 10   Constitutional: Alert and oriented.  Eyes: Conjunctivae are normal.  ENT      Head: Normocephalic and atraumatic.      Nose: No congestion/rhinnorhea.      Mouth/Throat: Mucous  membranes are moist.      Neck: No stridor. Hematological/Lymphatic/Immunilogical: No cervical lymphadenopathy. Cardiovascular: Normal rate, regular rhythm.  No murmurs, rubs, or gallops.  Respiratory: Normal respiratory effort without tachypnea nor retractions. Breath sounds are clear and equal bilaterally. No wheezes/rales/rhonchi. Gastrointestinal: Soft and non tender. No rebound. No guarding.  Genitourinary: Deferred Musculoskeletal: Right great toe with some redness, swelling. Tender to manipulation and tenderness.  Neurologic:  Normal speech and language. No gross focal neurologic deficits are appreciated.  Skin:  Skin is warm, dry and intact. No rash noted. Psychiatric: Mood and affect are normal. Speech and behavior are normal. Patient exhibits appropriate insight and judgment.  ____________________________________________    LABS (pertinent positives/negatives)  BMP na 132, k 3.6, glu 146, cr 1.94 CBC wbc 10.9, hgb 15.3, plt 221  ____________________________________________   EKG  None  ____________________________________________    RADIOLOGY  None  ____________________________________________   PROCEDURES  Procedures  ____________________________________________   INITIAL IMPRESSION / ASSESSMENT  AND PLAN / ED COURSE  Pertinent labs & imaging results that were available during my care of the patient were reviewed by me and considered in my medical decision making (see chart for details).   Patient presented to the emergency department today because of concern for right great toe pain. On exam there is some swelling and redness to the toe. It is extremely tender with manipulation and palpation. Exam is consistent with gout, and patient states he has history of the same and this feels similar. No paleness or discoloration to raise concern for ischemic toe. Doubt cellulitis at this time. Patient states he has done well with prednisone in the past for his gout.   ____________________________________________   FINAL CLINICAL IMPRESSION(S) / ED DIAGNOSES  Final diagnoses:  Foot pain, right  Acute gout involving toe of right foot, unspecified cause     Note: This dictation was prepared with Dragon dictation. Any transcriptional errors that result from this process are unintentional     Phineas Semen, MD 08/24/21 [phone]

## 2021-08-24 NOTE — ED Notes (Signed)
See triage note presents with pain and swelling to right foot  great toe is red and swollen  noticed sx's on friday

## 2021-08-24 NOTE — ED Provider Notes (Signed)
Emergency Medicine Provider Triage Evaluation Note  Bryan Welch, a 85 y.o. male  was evaluated in triage.  Pt complains of right foot pain.  He had vascular surgery on Thursday with Dr. Debroah Loop, but then began to notice couple days ago some redness to the ball of his first toe as well as some redness across the top of the foot.  He also noted some skin breakdown between the first and second toes.  He does give a history of gout and notes that the foot is tender, despite the skin breakdown noted.  Denies any fevers, chills, or sweats.  He is taken some tramadol but denies any significant benefit.  Review of Systems  Positive: Right foot pain Negative: FCS  Physical Exam  There were no vitals taken for this visit. Gen:   Awake, no distress  NAD Resp:  Normal effort CTA MSK:   Moves extremities without difficulty  Other:  Skin: right 1st MTP & dorsal foot erythema, tenderness. Ulcers to lateral 1st toe & medial 2nd toes   Medical Decision Making  Medically screening exam initiated at 12:06 PM.  Appropriate orders placed.  [name] was informed that the remainder of the evaluation will be completed by another provider, this initial triage assessment does not replace that evaluation, and the importance of remaining in the ED until their evaluation is complete.  Geriatric patient ED evaluation of some right great toe pain at the first MTP, as well as some skin breakdown between the first and second toes.   Lissa Hoard, PA-C 08/24/21 1219    Georga Hacking, MD 08/24/21 [phone]

## 2021-09-08 ENCOUNTER — Other Ambulatory Visit: Payer: Self-pay | Admitting: Podiatry

## 2021-09-09 ENCOUNTER — Encounter: Payer: Self-pay | Admitting: Podiatry

## 2021-09-13 NOTE — Discharge Instructions (Signed)
[facility]  POST OPERATIVE INSTRUCTIONS FOR DR. FOWLER AND [facility] PODIATRY [facility]PART[facility]   Take your medication as prescribed.  Pain medication should be taken only as needed.  Keep the dressing clean, dry and intact.  Keep your foot elevated above the heart level for the first 48 hours.  Walking to the bathroom and brief periods of walking are acceptable, unless we have instructed you to be non-weight bearing.  Always wear your post-op shoe when walking.  Always use your crutches if you are to be non-weight bearing.  Do not take a shower. Baths are permissible as long as the foot is kept out of the water.   Every hour you are awake:  Bend your knee 15 times. Flex foot 15 times Massage calf 15 times  Call [facility] ([phone]) if any of the following problems occur: You develop a temperature or fever. The bandage becomes saturated with blood. Medication does not stop your pain. Injury of the foot occurs. Any symptoms of infection including redness, odor, or red streaks running from wound. 

## 2021-09-14 ENCOUNTER — Ambulatory Visit (INDEPENDENT_AMBULATORY_CARE_PROVIDER_SITE_OTHER): Payer: Medicare Other | Admitting: Vascular Surgery

## 2021-09-14 ENCOUNTER — Other Ambulatory Visit (INDEPENDENT_AMBULATORY_CARE_PROVIDER_SITE_OTHER): Payer: Medicare Other

## 2021-09-16 ENCOUNTER — Encounter
Admission: RE | Disposition: A | Discharge status: Home / Self Care | Payer: Self-pay | Source: Home / Self Care | Attending: Podiatry

## 2021-09-16 ENCOUNTER — Ambulatory Visit: Payer: Medicare Other | Admitting: Anesthesiology

## 2021-09-16 ENCOUNTER — Ambulatory Visit
Admission: RE | Admit: 2021-09-16 | Discharge: 2021-09-16 | Disposition: A | Discharge status: Home / Self Care | Payer: Medicare Other | Attending: Podiatry | Admitting: Podiatry

## 2021-09-16 ENCOUNTER — Other Ambulatory Visit: Payer: Self-pay

## 2021-09-16 DIAGNOSIS — E1142 Type 2 diabetes mellitus with diabetic polyneuropathy: Secondary | ICD-10-CM | POA: Diagnosis not present

## 2021-09-16 DIAGNOSIS — L97514 Non-pressure chronic ulcer of other part of right foot with necrosis of bone: Secondary | ICD-10-CM | POA: Insufficient documentation

## 2021-09-16 DIAGNOSIS — E1151 Type 2 diabetes mellitus with diabetic peripheral angiopathy without gangrene: Secondary | ICD-10-CM | POA: Insufficient documentation

## 2021-09-16 DIAGNOSIS — L03031 Cellulitis of right toe: Secondary | ICD-10-CM | POA: Insufficient documentation

## 2021-09-16 DIAGNOSIS — I70239 Atherosclerosis of native arteries of right leg with ulceration of unspecified site: Secondary | ICD-10-CM | POA: Insufficient documentation

## 2021-09-16 DIAGNOSIS — Z87891 Personal history of nicotine dependence: Secondary | ICD-10-CM | POA: Insufficient documentation

## 2021-09-16 DIAGNOSIS — Z95820 Peripheral vascular angioplasty status with implants and grafts: Secondary | ICD-10-CM | POA: Diagnosis not present

## 2021-09-16 DIAGNOSIS — E11621 Type 2 diabetes mellitus with foot ulcer: Secondary | ICD-10-CM | POA: Diagnosis not present

## 2021-09-16 HISTORY — PX: AMPUTATION TOE: SHX6595

## 2021-09-16 SURGERY — AMPUTATION, TOE
Anesthesia: General | Site: Toe | Laterality: Right

## 2021-09-16 MED ORDER — LIDOCAINE HCL (PF) 1 % IJ SOLN
INTRAMUSCULAR | Status: DC | PRN
  Administered 2021-09-16: 20 mL via INTRADERMAL

## 2021-09-16 MED ORDER — ONDANSETRON HCL 4 MG/2ML IJ SOLN
INTRAMUSCULAR | Status: DC | PRN
  Administered 2021-09-16: 4 mg via INTRAVENOUS

## 2021-09-16 MED ORDER — 0.9 % SODIUM CHLORIDE (POUR BTL) OPTIME
TOPICAL | Status: DC | PRN
  Administered 2021-09-16: 500 mL

## 2021-09-16 MED ORDER — HYDROCODONE-ACETAMINOPHEN 7.5-325 MG PO TABS
1.0000 | ORAL_TABLET | Freq: Four times a day (QID) | ORAL | 0 refills | Status: AC | PRN
Start: 2021-09-16 — End: 2021-09-23

## 2021-09-16 MED ORDER — POVIDONE-IODINE 7.5 % EX SOLN: Freq: Once | CUTANEOUS | Status: DC

## 2021-09-16 MED ORDER — CEFAZOLIN SODIUM-DEXTROSE 2-4 GM/100ML-% IV SOLN
2.0000 g | INTRAVENOUS | Status: AC
  Administered 2021-09-16: 2 g via INTRAVENOUS

## 2021-09-16 MED ORDER — LIDOCAINE HCL (CARDIAC) PF 100 MG/5ML IV SOSY
PREFILLED_SYRINGE | INTRAVENOUS | Status: DC | PRN
  Administered 2021-09-16: 30 mg via INTRAVENOUS

## 2021-09-16 MED ORDER — LACTATED RINGERS IV SOLN: INTRAVENOUS | Status: DC

## 2021-09-16 MED ORDER — PROPOFOL 500 MG/50ML IV EMUL
INTRAVENOUS | Status: DC | PRN
  Administered 2021-09-16: 50 ug/kg/min via INTRAVENOUS

## 2021-09-16 SURGICAL SUPPLY — 27 items
BNDG CONFORM 2 STRL LF (GAUZE/BANDAGES/DRESSINGS) ×2 IMPLANT
BNDG CONFORM 3 STRL LF (GAUZE/BANDAGES/DRESSINGS) ×2 IMPLANT
BNDG ELASTIC 4X5.8 VLCR STR LF (GAUZE/BANDAGES/DRESSINGS) ×2 IMPLANT
BNDG ESMARK 4X12 TAN STRL LF (GAUZE/BANDAGES/DRESSINGS) ×2 IMPLANT
BNDG GAUZE ELAST 4 BULKY (GAUZE/BANDAGES/DRESSINGS) ×2 IMPLANT
CANISTER SUCT 1200ML W/VALVE (MISCELLANEOUS) ×2 IMPLANT
CUFF TOURN SGL QUICK 18X4 (TOURNIQUET CUFF) ×2 IMPLANT
DURAPREP 26ML APPLICATOR (WOUND CARE) ×2 IMPLANT
ELECT REM PT RETURN 9FT ADLT (ELECTROSURGICAL) ×2
ELECTRODE REM PT RTRN 9FT ADLT (ELECTROSURGICAL) ×1 IMPLANT
GAUZE SPONGE 4X4 12PLY STRL (GAUZE/BANDAGES/DRESSINGS) ×2 IMPLANT
GAUZE XEROFORM 1X8 LF (GAUZE/BANDAGES/DRESSINGS) ×2 IMPLANT
GLOVE SURG ENC MOIS LTX SZ7 (GLOVE) ×2 IMPLANT
GOWN STRL REUS W/ TWL LRG LVL3 (GOWN DISPOSABLE) ×2 IMPLANT
GOWN STRL REUS W/TWL LRG LVL3 (GOWN DISPOSABLE) ×4
KIT TURNOVER KIT A (KITS) ×2 IMPLANT
NS IRRIG 500ML POUR BTL (IV SOLUTION) ×2 IMPLANT
PACK EXTREMITY (MISCELLANEOUS) ×2 IMPLANT
SOL PREP PVP 2OZ (MISCELLANEOUS) ×2
SOLUTION PREP PVP 2OZ (MISCELLANEOUS) ×1 IMPLANT
SQUARED TOE POST OP SHOE M ×2 IMPLANT
STOCKINETTE STRL 6IN 960660 (GAUZE/BANDAGES/DRESSINGS) ×2 IMPLANT
SUT ETHILON 3-0 FS-10 30 BLK (SUTURE) ×4
SUT VIC AB 3-0 SH 27 (SUTURE) ×2
SUT VIC AB 3-0 SH 27X BRD (SUTURE) ×1 IMPLANT
SUTURE EHLN 3-0 FS-10 30 BLK (SUTURE) ×2 IMPLANT
SYR 10ML LL (SYRINGE) ×2 IMPLANT

## 2021-09-16 NOTE — Anesthesia Preprocedure Evaluation (Signed)
Anesthesia Evaluation  Patient identified by MRN, date of birth, ID band Patient awake    Reviewed: Allergy & Precautions, NPO status   Airway Mallampati: II  TM Distance: >3 FB     Dental   Pulmonary former smoker,    Pulmonary exam normal        Cardiovascular hypertension, + CAD and + Peripheral Vascular Disease (s/p right external iliac artery stent in past and balloon angioplasty 08/19/21 )   Rhythm:Regular Rate:Normal     Neuro/Psych    GI/Hepatic hiatal hernia,   Endo/Other    Renal/GU CRFRenal disease     Musculoskeletal  (+) Arthritis ,   Abdominal   Peds  Hematology   Anesthesia Other Findings   Reproductive/Obstetrics                             Anesthesia Physical Anesthesia Plan  ASA: 3  Anesthesia Plan: General   Post-op Pain Management:    Induction: Intravenous  PONV Risk Score and Plan: Propofol infusion, TIVA and Treatment may vary due to age or medical condition  Airway Management Planned: Natural Airway and Nasal Cannula  Additional Equipment:   Intra-op Plan:   Post-operative Plan:   Informed Consent: I have reviewed the patients History and Physical, chart, labs and discussed the procedure including the risks, benefits and alternatives for the proposed anesthesia with the patient or authorized representative who has indicated his/her understanding and acceptance.       Plan Discussed with: CRNA  Anesthesia Plan Comments:         Anesthesia Quick Evaluation

## 2021-09-16 NOTE — Transfer of Care (Signed)
Immediate Anesthesia Transfer of Care Note  [name]  Procedure(s) Performed: AMPUTATION TOE (Right: Toe)  Patient Location: PACU  Anesthesia Type: General  Level of Consciousness: awake, alert  and patient cooperative  Airway and Oxygen Therapy: Patient Spontanous Breathing and Patient connected to supplemental oxygen  Post-op Assessment: Post-op Vital signs reviewed, Patient's Cardiovascular Status Stable, Respiratory Function Stable, Patent Airway and No signs of Nausea or vomiting  Post-op Vital Signs: Reviewed and stable  Complications: No notable events documented.

## 2021-09-16 NOTE — H&P (Signed)
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  The various methods of treatment have been discussed with the patient.  No guarantees were given.  After consideration of risks, benefits and other options for treatment, the patient has consented to surgery.  I have reviewed the patients' chart and labs.    PROCEDURE: RIGHT 2ND TOE AMPUTATION  A history and physical examination was performed in my office.  The patient was reexamined.  There have been no changes to this history and physical examination.  Rosetta Posner, DPM

## 2021-09-16 NOTE — Anesthesia Procedure Notes (Signed)
Procedure Name: General with mask airway Date/Time: 09/16/2021 10:48 AM Performed by: Jinny Blossom, CRNA Pre-anesthesia Checklist: Patient identified, Emergency Drugs available, Suction available, Timeout performed and Patient being monitored Patient Re-evaluated:Patient Re-evaluated prior to induction Oxygen Delivery Method: Simple face mask Placement Confirmation: positive ETCO2

## 2021-09-16 NOTE — Op Note (Signed)
PODIATRY / FOOT AND ANKLE SURGERY OPERATIVE REPORT    SURGEON: Caroline More, DPM  PRE-OPERATIVE DIAGNOSIS:  1.  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  PVD  POST-OPERATIVE DIAGNOSIS: Same  PROCEDURE(S): Right second toe amputation  HEMOSTASIS: No tourniquet  ANESTHESIA: MAC  ESTIMATED BLOOD LOSS: 10 cc  FINDING(S): 1.  Minimal blood loss during procedure without tourniquet indicative of severe peripheral vascular disease  PATHOLOGY/SPECIMEN(S): Right second toe with proximal margin marked in purple ink  INDICATIONS:   Bryan Welch is a 85 y.o. male who presents with a painful nonhealing ulceration to the distal medial aspect of the DIPJ of the right second toe due to the big toe rubbing against the area.  Patient was seen by Dr. Cleda Mccreedy for this issue and was noted to have an ulceration that probed to bone.  Patient has been treated with antibiotic therapy as well as local wound care.  Patient is also had revascularization procedure performed to the right foot and has been optimized for surgical intervention.  All treatment options were discussed with patient both conservative and surgical attempts at correction clean potential risks and complications at this time patient is elected for surgical procedure consisting of right second toe amputation.  No guarantees given.  Discussed with patient who is very high risk due to history of severe vascular disease.  DESCRIPTION: After obtaining full informed written consent, the patient was brought back to the operating room and placed supine upon the operating table.  The patient received IV antibiotics prior to induction.  After obtaining adequate anesthesia, 20 cc of 1% lidocaine plain was injected about the second ray and second digit.  The patient was prepped and draped in the standard fashion.  A tourniquet was applied but never inflated throughout the case.  Attention was directed to the  right second toe where an incision was drawn to make an elliptical type of incision around the second toe at the base the proximal phalanx of the second digit.  The ellipse was started at the dorsal aspect of the second metatarsal phalange joint and extended across the toe to the base the proximal phalanx and to the plantar aspect of the second metatarsal phalangeal joint creating a 3-1 type of ellipse around the digit.  The incision was made straight to bone.  At this time an extensor tenotomy and capsulotomy was performed followed by release the collateral and suspensory ligaments as well as the plantar plate and tendon of the flexor hallucis longus.  The second digit was disarticulated at the second metatarsal phalangeal joint and passed off the operative site.  The proximal margin was marked in purple ink and sent off to pathology.  The extensor and flexor tendons were cut as far proximally as possible.  At this time the surgical site was flushed with copious amounts normal sterile saline.  Minimal bleeding occurred during procedure overall indicative of fairly significant/severe peripheral vascular disease.  No significant bleeding vessels were noted.  The surgical site was once again flushed with copious amounts normal sterile saline.  The deep fascia and subcutaneous tissue was reapproximated well coapted with 3-0 Vicryl and the skin was then reapproximated well coapted with 3-0 nylon.    A postoperative dressing was applied consisting of Xeroform to the incision line followed by 4 x 4 gauze, Kerlix, Ace wrap.  The patient tolerated the procedure and anesthesia well and was transferred to recovery room vital signs stable vascular status appearing  to be intact to digits of the right foot.  Patient was placed in a postoperative shoe.  Postoperative orders were written and patient given instructions.  Instructed patient on partial weightbearing with heel contact and surgical shoe.  Discussed with family that  patient did not bleed much during procedure indicative of peripheral vascular disease and could lead to wound healing complications.  We will continue to monitor closely in outpatient setting, patient to follow-up in 1 week for evaluation.  COMPLICATIONS: None  CONDITION: Good, stable  Caroline More, DPM

## 2021-09-16 NOTE — Anesthesia Postprocedure Evaluation (Signed)
Anesthesia Post Note  [name]  Procedure(s) Performed: AMPUTATION TOE (Right: Toe)     Patient location during evaluation: PACU Anesthesia Type: General Level of consciousness: awake Pain management: pain level controlled Vital Signs Assessment: post-procedure vital signs reviewed and stable Respiratory status: respiratory function stable Cardiovascular status: stable Postop Assessment: no signs of nausea or vomiting Anesthetic complications: no   No notable events documented.  Jola Babinski

## 2021-09-17 ENCOUNTER — Encounter (INDEPENDENT_AMBULATORY_CARE_PROVIDER_SITE_OTHER): Payer: Medicare Other

## 2021-09-17 ENCOUNTER — Encounter: Payer: Self-pay | Admitting: Podiatry

## 2021-09-20 LAB — SURGICAL PATHOLOGY

## 2021-09-28 ENCOUNTER — Ambulatory Visit (INDEPENDENT_AMBULATORY_CARE_PROVIDER_SITE_OTHER): Payer: Medicare Other

## 2021-09-28 ENCOUNTER — Other Ambulatory Visit: Payer: Self-pay

## 2021-09-28 ENCOUNTER — Ambulatory Visit (INDEPENDENT_AMBULATORY_CARE_PROVIDER_SITE_OTHER): Payer: Medicare Other | Admitting: Vascular Surgery

## 2021-09-28 VITALS — BP 121/64 | HR 86 | Ht 66.0 in | Wt 129.0 lb

## 2021-09-28 DIAGNOSIS — E785 Hyperlipidemia, unspecified: Secondary | ICD-10-CM

## 2021-09-28 DIAGNOSIS — I7025 Atherosclerosis of native arteries of other extremities with ulceration: Secondary | ICD-10-CM

## 2021-09-28 DIAGNOSIS — I739 Peripheral vascular disease, unspecified: Secondary | ICD-10-CM | POA: Diagnosis not present

## 2021-09-28 DIAGNOSIS — I6523 Occlusion and stenosis of bilateral carotid arteries: Secondary | ICD-10-CM | POA: Diagnosis not present

## 2021-09-28 DIAGNOSIS — I10 Essential (primary) hypertension: Secondary | ICD-10-CM | POA: Diagnosis not present

## 2021-09-28 DIAGNOSIS — I7143 Infrarenal abdominal aortic aneurysm, without rupture: Secondary | ICD-10-CM | POA: Diagnosis not present

## 2021-09-28 NOTE — Progress Notes (Signed)
MRN : [id number]  Bryan Welch is a 85 y.o. (05/18/1936) male who presents with chief complaint of  Chief Complaint  Patient presents with   Follow-up    4 wk post Op with ABIs  .  History of Present Illness: Patient returns today in follow up of his PAD.  His toe amputation site is healing slowly.  About a month ago, he underwent fairly extensive right lower extremity revascularization.  He seems to be doing pretty well today.  His access site is well-healed.  No periprocedural complications. ABIs today are significantly improved up to 0.97 on the right.  Previously these were below 0.8.  On the left, where stable at 0.89.  Current Outpatient Medications  Medication Sig Dispense Refill   acetaminophen (TYLENOL) 500 MG tablet Take 1,000 mg by mouth every 6 (six) hours as needed for moderate pain.      ALPRAZolam (XANAX) 0.25 MG tablet Take 0.25 mg by mouth at bedtime.     amLODipine (NORVASC) 2.5 MG tablet Take 2.5 mg by mouth daily.     aspirin EC 81 MG tablet Take 81 mg daily by mouth.      Cholecalciferol 25 MCG (1000 UT) capsule Take 2,000 Units by mouth daily.     diphenhydrAMINE (BENADRYL) 25 mg capsule Take 25 mg at bedtime by mouth.      famotidine (PEPCID) 10 MG tablet Take 10 mg by mouth daily.      ferrous sulfate 325 (65 FE) MG tablet Take 325 mg by mouth daily with breakfast.     gabapentin (NEURONTIN) 300 MG capsule Take by mouth.     hydrochlorothiazide (HYDRODIURIL) 25 MG tablet Take 1 tablet (25 mg total) by mouth daily. 30 tablet 0   pravastatin (PRAVACHOL) 20 MG tablet Take 20 mg daily by mouth.      predniSONE (DELTASONE) 10 MG tablet Days 1-3: 40 mg (4 tablets) orally, daily Days 4,5: 30 mg (3 tablets) orally, daily Days 6,7: 20 mg (2 tablets) orally, daily Days 8,9: 10 mg (1 tablet) orally, dailyl 24 tablet 0   predniSONE (STERAPRED UNI-PAK 21 TAB) 10 MG (21) TBPK tablet Take 6 tablets on the first day and decrease by 1 tablet each day until finished. 21 tablet  0   tamsulosin (FLOMAX) 0.4 MG CAPS capsule Take 0.4 mg by mouth daily.   3   traMADol (ULTRAM) 50 MG tablet Take 1 tablet (50 mg total) by mouth every 6 (six) hours as needed. 20 tablet 0   XARELTO 20 MG TABS tablet TAKE 1 TABLET BY MOUTH EVERY DAY *STOP ELIQUIS* 90 tablet 2   No current facility-administered medications for this visit.    Past Medical History:  Diagnosis Date   Arthritis    Toes   Coronary artery disease    History of hiatal hernia    Hyperlipidemia    Hypertension    Kidney disorder    "right kidney doesn't work"   Presence of stent in artery    "4 in legs, 1 in neck"   Vascular abnormality    Wears dentures    full upper and lower    Past Surgical History:  Procedure Laterality Date   AMPUTATION TOE Right 09/16/2021   Procedure: AMPUTATION TOE;  Surgeon: Rosetta PosnerBaker, Andrew, DPM;  Location: [facility];  Service: Podiatry;  Laterality: Right;   BACK SURGERY     CAROTID STENT Right    CATARACT EXTRACTION W/PHACO Left 06/17/2019   Procedure: CATARACT EXTRACTION  PHACO AND INTRAOCULAR LENS PLACEMENT (IOC) LEFT;  Surgeon: Nevada Crane, MD;  Location: [facility];  Service: Ophthalmology;  Laterality: Left;   CATARACT EXTRACTION W/PHACO Right 07/08/2019   Procedure: CATARACT EXTRACTION PHACO AND INTRAOCULAR LENS PLACEMENT (IOC) right  00:39.2  10.8%  5.02  ;  Surgeon: Nevada Crane, MD;  Location: [facility];  Service: Ophthalmology;  Laterality: Right;   LEG SURGERY Bilateral    stent placement   LOWER EXTREMITY ANGIOGRAPHY Right 08/19/2021   Procedure: LOWER EXTREMITY ANGIOGRAPHY;  Surgeon: Annice Needy, MD;  Location: [facility];  Service: Cardiovascular;  Laterality: Right;   PERIPHERAL VASCULAR BALLOON ANGIOPLASTY Right 09/21/2017   Procedure: PERIPHERAL VASCULAR BALLOON ANGIOPLASTY;  Surgeon: Annice Needy, MD;  Location: [facility];  Service: Cardiovascular;  Laterality: Right;   REVERSE SHOULDER  ARTHROPLASTY Right 11/26/2019   Procedure: REVERSE SHOULDER ARTHROPLASTY;  Surgeon: Christena Flake, MD;  Location: [facility];  Service: Orthopedics;  Laterality: Right;      Social History             Tobacco Use   Smoking status: Former Smoker      Types: Cigarettes      Quit date: 2001      Years since quitting: 20.8   Smokeless tobacco: Never Used  Building services engineer Use: Never used  Substance Use Topics   Alcohol use: Yes      Alcohol/week: 3.0 standard drinks      Types: 3 Cans of beer per week      Comment: occasionally   Drug use: No                 Family History  Problem Relation Age of Onset   Cancer Mother     Cancer Father    no bleeding or clotting disorders          Allergies  Allergen Reactions   Codeine Itching      REVIEW OF SYSTEMS (Negative unless checked)   Constitutional: [] Weight loss  [] Fever  [] Chills Cardiac: [] Chest pain   [] Chest pressure   [] Palpitations   [] Shortness of breath when laying flat   [] Shortness of breath at rest   [] Shortness of breath with exertion. Vascular:  [] Pain in legs with walking   [] Pain in legs at rest   [] Pain in legs when laying flat   [x] Claudication   [] Pain in feet when walking  [] Pain in feet at rest  [] Pain in feet when laying flat   [] History of DVT   [] Phlebitis   [] Swelling in legs   [] Varicose veins   [] Non-healing ulcers Pulmonary:   [] Uses home oxygen   [] Productive cough   [] Hemoptysis   [] Wheeze  [] COPD   [] Asthma Neurologic:  [] Dizziness  [] Blackouts   [] Seizures   [] History of stroke   [] History of TIA  [] Aphasia   [] Temporary blindness   [] Dysphagia   [] Weakness or numbness in arms   [] Weakness or numbness in legs Musculoskeletal:  [x] Arthritis   [] Joint swelling   [x] Joint pain   [] Low back pain Hematologic:  [] Easy bruising  [] Easy bleeding   [] Hypercoagulable state   [] Anemic   Gastrointestinal:  [] Blood in stool   [] Vomiting blood  [] Gastroesophageal reflux/heartburn   [] Abdominal  pain Genitourinary:  [] Chronic kidney disease   [] Difficult urination  [] Frequent urination  [] Burning with urination   [] Hematuria Skin:  [] Rashes   [] Ulcers   [] Wounds Psychological:  [] History of  anxiety   []  History of major depression.     Physical Examination  BP 121/64   Pulse 86   Ht 5\' 6"  (1.676 m)   Wt 129 lb (58.5 kg)   BMI 20.82 kg/m  Gen:  WD/WN, NAD. Appears younger than stated age. Head: [employer]/AT, No temporalis wasting. Ear/Nose/Throat: Hearing grossly intact, nares w/o erythema or drainage Eyes: Conjunctiva clear. Sclera non-icteric Neck: Supple.  Trachea midline Pulmonary:  Good air movement, no use of accessory muscles.  Cardiac: RRR, no JVD Vascular:  Vessel Right Left  Radial Palpable Palpable                          PT 2+ Palpable 1+ Palpable  DP 1+ Palpable 1+ Palpable   Gastrointestinal: soft, non-tender/non-distended. No guarding/reflex.  Musculoskeletal: M/S 5/5 throughout.  No deformity or atrophy. Right foot wound dressed. Trace RLE edema. Neurologic: Sensation grossly intact in extremities.  Symmetrical.  Speech is fluent.  Psychiatric: Judgment intact, Mood & affect appropriate for pt's clinical situation. Dermatologic: Right foot wound dressed.      Labs Recent Results (from the past 2160 hour(s))  BUN     Status: Abnormal   Collection Time: 08/19/21 10:35 AM  Result Value Ref Range   BUN 45 (H) 8 - 23 mg/dL    Comment: Performed at [facility], [address] [place], [place] [address]  Creatinine, serum     Status: Abnormal   Collection Time: 08/19/21 10:35 AM  Result Value Ref Range   Creatinine, Ser 1.96 (H) 0.61 - 1.24 mg/dL   GFR, Estimated 34 (L) >60 mL/min    Comment: (NOTE) Calculated using the CKD-EPI Creatinine Equation (2021) Performed at [facility], [address] [place], [place] [id number]   Basic metabolic panel     Status: Abnormal   Collection Time: 08/24/21 12:21 PM  Result Value Ref  Range   Sodium 132 (L) 135 - 145 mmol/L   Potassium 3.6 3.5 - 5.1 mmol/L   Chloride 91 (L) 98 - 111 mmol/L   CO2 28 22 - 32 mmol/L   Glucose, Bld 146 (H) 70 - 99 mg/dL    Comment: Glucose reference range applies only to samples taken after fasting for at least 8 hours.   BUN 33 (H) 8 - 23 mg/dL   Creatinine, Ser 1.94 (H) 0.61 - 1.24 mg/dL   Calcium 9.5 8.9 - 10.3 mg/dL   GFR, Estimated 34 (L) >60 mL/min    Comment: (NOTE) Calculated using the CKD-EPI Creatinine Equation (2021)    Anion gap 13 5 - 15    Comment: Performed at [facility], [address] [place], [place] [address]  CBC with Differential     Status: Abnormal   Collection Time: 08/24/21 12:21 PM  Result Value Ref Range   WBC 10.9 (H) 4.0 - 10.5 K/uL   RBC 5.04 4.22 - 5.81 MIL/uL   Hemoglobin 15.3 13.0 - 17.0 g/dL   HCT 44.2 39.0 - 52.0 %   MCV 87.7 80.0 - 100.0 fL   MCH 30.4 26.0 - 34.0 pg   MCHC 34.6 30.0 - 36.0 g/dL   RDW 12.1 11.5 - 15.5 %   Platelets 221 150 - 400 K/uL   nRBC 0.0 0.0 - 0.2 %   Neutrophils Relative % 81 %   Neutro Abs 8.9 (H) 1.7 - 7.7 K/uL   Lymphocytes Relative 8 %   Lymphs Abs 0.9 0.7 -  4.0 K/uL   Monocytes Relative 7 %   Monocytes Absolute 0.7 0.1 - 1.0 K/uL   Eosinophils Relative 2 %   Eosinophils Absolute 0.2 0.0 - 0.5 K/uL   Basophils Relative 1 %   Basophils Absolute 0.1 0.0 - 0.1 K/uL   Immature Granulocytes 1 %   Abs Immature Granulocytes 0.08 (H) 0.00 - 0.07 K/uL    Comment: Performed at [facility], [address] [place], [place] [address]  Uric acid     Status: Abnormal   Collection Time: 08/24/21 12:21 PM  Result Value Ref Range   Uric Acid, Serum 10.4 (H) 3.7 - 8.6 mg/dL    Comment: Performed at [facility], [address], [place], [place] [address]  Surgical pathology     Status: None   Collection Time: 09/16/21 10:49 AM  Result Value Ref Range   SURGICAL PATHOLOGY      SURGICAL PATHOLOGY CASE: [id number] [phone] PATIENT: Earma Reading Surgical Pathology Report     Specimen Submitted: A. 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    DIAGNOSIS: A. TOE, RIGHT SECOND TOE; AMPUTATION: - ULCERATION AND ACUTE SUPPURATIVE INFLAMMATION. - VIABLE BONE WITHOUT EVIDENCE OF ACUTE OSTEOMYELITIS. - VIABLE SKIN, SOFT TISSUE, AND BONY MARGINS WITHOUT ACUTE INFLAMMATION.  GROSS DESCRIPTION: A. Labeled: Right second toe Received: Formalin Collection time: 10:49 AM on 09/16/2021 Placed into formalin time: 10:49 AM on 09/16/2021 Size: 6.3 x 2.2 x 1.8 cm Description of lesion(s): There is a 1.0  x 0.8 cm ulcerated lesion on the skin surface which is 2.4 cm from the closest skin margin. Proximal margin: The proximal margin is grossly viable with tan skin, tan smooth and glistening bone, and yellow, hemorrhagic soft tissue. The margins are inked black. Bone: The bone underlying the ulcer is tan-yellow and softened Other findings: None present  Block summary: 1 -perpendicular sections of bone at margin 2-cross-section of ulcerated lesion with underlying bone  Tissue decalcification: Cassettes 1 and 2  [facility] 09/17/2021  Final Diagnosis performed by Allena Napoleon, MD.   Electronically signed 09/20/2021 9:48:44AM The electronic signature indicates that the named Attending Pathologist has evaluated the specimen Technical component performed at [facility], [address] [place], [place] [address] Lab: [phone] Dir: Rush Farmer, MD, MMM  Professional component performed at [facility], [address], [place], [place] [address] Lab: [phone] Dir: Kathi Simpers, MD     Radiology No results found.  Assessment/Plan Hyperlipidemia lipid control important in reducing the progression of atherosclerotic disease. Continue statin therapy     Essential hypertension, benign blood pressure control important in reducing the progression of atherosclerotic disease. On appropriate oral medications.   Carotid stenosis Stable disease checked earlier this year.  To be checked next year.  Atherosclerosis of native arteries of the extremities with ulceration ([place]) ABIs today are significantly improved up to 0.97 on the right.  Previously these were below 0.8.  On the left, where stable at 0.89.  His toe amputation site is healing quite slowly.  He is scheduled to see the podiatrist again in about 2 weeks.  It would appear his perfusion is reasonably intact at this point.  I will plan on seeing him back in about 3 months with noninvasive studies.  Continue current medical regimen.    [name], MD  09/28/2021 9:33 AM    This note was created with Dragon medical transcription system.  Any errors from dictation are purely unintentional

## 2021-09-28 NOTE — Assessment & Plan Note (Signed)
ABIs today are significantly improved up to 0.97 on the right.  Previously these were below 0.8.  On the left, where stable at 0.89.  His toe amputation site is healing quite slowly.  He is scheduled to see the podiatrist again in about 2 weeks.  It would appear his perfusion is reasonably intact at this point.  I will plan on seeing him back in about 3 months with noninvasive studies.  Continue current medical regimen.

## 2021-10-15 ENCOUNTER — Other Ambulatory Visit (INDEPENDENT_AMBULATORY_CARE_PROVIDER_SITE_OTHER): Payer: Self-pay | Admitting: Nurse Practitioner

## 2022-01-14 ENCOUNTER — Encounter (INDEPENDENT_AMBULATORY_CARE_PROVIDER_SITE_OTHER): Payer: Self-pay | Admitting: Vascular Surgery

## 2022-01-14 ENCOUNTER — Ambulatory Visit (INDEPENDENT_AMBULATORY_CARE_PROVIDER_SITE_OTHER): Payer: Medicare Other

## 2022-01-14 ENCOUNTER — Ambulatory Visit (INDEPENDENT_AMBULATORY_CARE_PROVIDER_SITE_OTHER): Payer: Medicare Other | Admitting: Vascular Surgery

## 2022-01-14 ENCOUNTER — Other Ambulatory Visit: Payer: Self-pay

## 2022-01-14 VITALS — BP 128/67 | HR 70 | Resp 17 | Ht 66.0 in | Wt 148.0 lb

## 2022-01-14 DIAGNOSIS — I6523 Occlusion and stenosis of bilateral carotid arteries: Secondary | ICD-10-CM | POA: Diagnosis not present

## 2022-01-14 DIAGNOSIS — E785 Hyperlipidemia, unspecified: Secondary | ICD-10-CM | POA: Diagnosis not present

## 2022-01-14 DIAGNOSIS — I7025 Atherosclerosis of native arteries of other extremities with ulceration: Secondary | ICD-10-CM

## 2022-01-14 DIAGNOSIS — I10 Essential (primary) hypertension: Secondary | ICD-10-CM | POA: Diagnosis not present

## 2022-01-14 NOTE — Assessment & Plan Note (Signed)
His ABIs today are 1.13 on the right and 1.11 on the left.  Ulcer has almost completely healed and is down to a scab at this point.  No limb threatening pain or signs of infection.  Continue current medical regimen.  Recheck in 6 months. ?

## 2022-01-14 NOTE — Assessment & Plan Note (Signed)
We will check at his next visit. ?

## 2022-01-14 NOTE — Progress Notes (Signed)
MRN : [id number]  Bryan Welch is a 86 y.o. (1935-12-05) male who presents with chief complaint of  Chief Complaint  Patient presents with   Follow-up    ultrasound  .  History of Present Illness: Patient returns today in follow up of his peripheral arterial disease.  He underwent revascularization about 4 months ago for PAD with ulceration.  His ulcer has almost completely healed at this point.  No infection.  No rest pain.  No gangrenous changes.  His ABIs today are 1.13 on the right and 1.11 on the left.  Current Outpatient Medications  Medication Sig Dispense Refill   acetaminophen (TYLENOL) 500 MG tablet Take 1,000 mg by mouth every 6 (six) hours as needed for moderate pain.      ALPRAZolam (XANAX) 0.25 MG tablet Take 0.25 mg by mouth at bedtime.     amLODipine (NORVASC) 2.5 MG tablet Take 2.5 mg by mouth daily.     aspirin EC 81 MG tablet Take 81 mg daily by mouth.      Cholecalciferol 25 MCG (1000 UT) capsule Take 2,000 Units by mouth daily.     diphenhydrAMINE (BENADRYL) 25 mg capsule Take 25 mg at bedtime by mouth.      famotidine (PEPCID) 10 MG tablet Take 10 mg by mouth daily.      ferrous sulfate 325 (65 FE) MG tablet Take 325 mg by mouth daily with breakfast.     gabapentin (NEURONTIN) 300 MG capsule Take by mouth.     hydrochlorothiazide (HYDRODIURIL) 25 MG tablet Take 1 tablet (25 mg total) by mouth daily. 30 tablet 0   mupirocin ointment (BACTROBAN) 2 % APPLY TO AFFECTED AREA 3 TIMES A DAY FOR 7 DAYS     pravastatin (PRAVACHOL) 20 MG tablet Take 20 mg daily by mouth.      tamsulosin (FLOMAX) 0.4 MG CAPS capsule Take 0.4 mg by mouth daily.   3   XARELTO 20 MG TABS tablet TAKE 1 TABLET BY MOUTH EVERY DAY *STOP ELIQUIS* 90 tablet 2   gabapentin (NEURONTIN) 300 MG capsule Take by mouth.     predniSONE (DELTASONE) 10 MG tablet Days 1-3: 40 mg (4 tablets) orally, daily Days 4,5: 30 mg (3 tablets) orally, daily Days 6,7: 20 mg (2 tablets) orally, daily Days 8,9: 10 mg (1  tablet) orally, dailyl (Patient not taking: Reported on 01/14/2022) 24 tablet 0   predniSONE (STERAPRED UNI-PAK 21 TAB) 10 MG (21) TBPK tablet Take 6 tablets on the first day and decrease by 1 tablet each day until finished. (Patient not taking: Reported on 01/14/2022) 21 tablet 0   traMADol (ULTRAM) 50 MG tablet Take 1 tablet (50 mg total) by mouth every 6 (six) hours as needed. (Patient not taking: Reported on 01/14/2022) 20 tablet 0   No current facility-administered medications for this visit.    Past Medical History:  Diagnosis Date   Arthritis    Toes   Coronary artery disease    History of hiatal hernia    Hyperlipidemia    Hypertension    Kidney disorder    "right kidney doesn't work"   Presence of stent in artery    "4 in legs, 1 in neck"   Vascular abnormality    Wears dentures    full upper and lower    Past Surgical History:  Procedure Laterality Date   AMPUTATION TOE Right 09/16/2021   Procedure: AMPUTATION TOE;  Surgeon: Rosetta Posner, DPM;  Location: [facility];  Service: Podiatry;  Laterality: Right;   BACK SURGERY     CAROTID STENT Right    CATARACT EXTRACTION W/PHACO Left 06/17/2019   Procedure: CATARACT EXTRACTION PHACO AND INTRAOCULAR LENS PLACEMENT (IOC) LEFT;  Surgeon: Nevada Crane, MD;  Location: [facility];  Service: Ophthalmology;  Laterality: Left;   CATARACT EXTRACTION W/PHACO Right 07/08/2019   Procedure: CATARACT EXTRACTION PHACO AND INTRAOCULAR LENS PLACEMENT (IOC) right  00:39.2  10.8%  5.02  ;  Surgeon: Nevada Crane, MD;  Location: [facility];  Service: Ophthalmology;  Laterality: Right;   LEG SURGERY Bilateral    stent placement   LOWER EXTREMITY ANGIOGRAPHY Right 08/19/2021   Procedure: LOWER EXTREMITY ANGIOGRAPHY;  Surgeon: Annice Needy, MD;  Location: [facility];  Service: Cardiovascular;  Laterality: Right;   PERIPHERAL VASCULAR BALLOON ANGIOPLASTY Right 09/21/2017   Procedure: PERIPHERAL VASCULAR  BALLOON ANGIOPLASTY;  Surgeon: Annice Needy, MD;  Location: [facility];  Service: Cardiovascular;  Laterality: Right;   REVERSE SHOULDER ARTHROPLASTY Right 11/26/2019   Procedure: REVERSE SHOULDER ARTHROPLASTY;  Surgeon: Christena Flake, MD;  Location: [facility];  Service: Orthopedics;  Laterality: Right;    Social History             Tobacco Use   Smoking status: Former Smoker      Types: Cigarettes      Quit date: 2001      Years since quitting: 20.8   Smokeless tobacco: Never Used  Building services engineer Use: Never used  Substance Use Topics   Alcohol use: Yes      Alcohol/week: 3.0 standard drinks      Types: 3 Cans of beer per week      Comment: occasionally   Drug use: No                 Family History  Problem Relation Age of Onset   Cancer Mother     Cancer Father    no bleeding or clotting disorders          Allergies  Allergen Reactions   Codeine Itching      REVIEW OF SYSTEMS (Negative unless checked)   Constitutional: [] Weight loss  [] Fever  [] Chills Cardiac: [] Chest pain   [] Chest pressure   [] Palpitations   [] Shortness of breath when laying flat   [] Shortness of breath at rest   [] Shortness of breath with exertion. Vascular:  [] Pain in legs with walking   [] Pain in legs at rest   [] Pain in legs when laying flat   [x] Claudication   [] Pain in feet when walking  [] Pain in feet at rest  [] Pain in feet when laying flat   [] History of DVT   [] Phlebitis   [] Swelling in legs   [] Varicose veins   [] Non-healing ulcers Pulmonary:   [] Uses home oxygen   [] Productive cough   [] Hemoptysis   [] Wheeze  [] COPD   [] Asthma Neurologic:  [] Dizziness  [] Blackouts   [] Seizures   [] History of stroke   [] History of TIA  [] Aphasia   [] Temporary blindness   [] Dysphagia   [] Weakness or numbness in arms   [] Weakness or numbness in legs Musculoskeletal:  [x] Arthritis   [] Joint swelling   [x] Joint pain   [] Low back pain Hematologic:  [] Easy bruising  [] Easy bleeding    [] Hypercoagulable state   [] Anemic   Gastrointestinal:  [] Blood in stool   [] Vomiting blood  [] Gastroesophageal reflux/heartburn   [] Abdominal pain Genitourinary:  [] Chronic kidney disease   []   Difficult urination  [] Frequent urination  [] Burning with urination   [] Hematuria Skin:  [] Rashes   [] Ulcers   [] Wounds Psychological:  [] History of anxiety   []  History of major depression.   Physical Examination  BP 128/67 (BP Location: Left Arm)    Pulse 70    Resp 17    Ht 5\' 6"  (1.676 m)    Wt 148 lb (67.1 kg)    BMI 23.89 kg/m  Gen:  WD/WN, NAD Head: [place]/AT, No temporalis wasting. Ear/Nose/Throat: Hearing grossly intact, nares w/o erythema or drainage Eyes: Conjunctiva clear. Sclera non-icteric Neck: Supple.  Trachea midline Pulmonary:  Good air movement, no use of accessory muscles.  Cardiac: irregular Vascular:  Vessel Right Left  Radial Palpable Palpable                          PT 1+ Palpable 1+ Palpable  DP 2+ Palpable 1+ Palpable   Gastrointestinal: soft, non-tender/non-distended. No guarding/reflex.  Musculoskeletal: M/S 5/5 throughout.  No deformity or atrophy. Trace LE edema. Neurologic: Sensation grossly intact in extremities.  Symmetrical.  Speech is fluent.  Psychiatric: Judgment intact, Mood & affect appropriate for pt's clinical situation. Dermatologic: No rashes or ulcers noted.  No cellulitis or open wounds.      Labs No results found for this or any previous visit (from the past 2160 hour(s)).  Radiology No results found.  Assessment/Plan Hyperlipidemia lipid control important in reducing the progression of atherosclerotic disease. Continue statin therapy     Essential hypertension, benign blood pressure control important in reducing the progression of atherosclerotic disease. On appropriate oral medications.  Carotid stenosis We will check at his next visit.  Atherosclerosis of native arteries of the extremities with ulceration ([facility]) His ABIs  today are 1.13 on the right and 1.11 on the left.  Ulcer has almost completely healed and is down to a scab at this point.  No limb threatening pain or signs of infection.  Continue current medical regimen.  Recheck in 6 months.    Festus BarrenJason Eloni Darius, MD  01/14/2022 9:29 AM    This note was created with Dragon medical transcription system.  Any errors from dictation are purely unintentional

## 2022-03-15 ENCOUNTER — Encounter (INDEPENDENT_AMBULATORY_CARE_PROVIDER_SITE_OTHER): Payer: Medicare Other

## 2022-03-15 ENCOUNTER — Ambulatory Visit (INDEPENDENT_AMBULATORY_CARE_PROVIDER_SITE_OTHER): Payer: Medicare Other | Admitting: Vascular Surgery

## 2022-07-19 ENCOUNTER — Encounter (INDEPENDENT_AMBULATORY_CARE_PROVIDER_SITE_OTHER): Payer: Self-pay | Admitting: Vascular Surgery

## 2022-07-19 ENCOUNTER — Ambulatory Visit (INDEPENDENT_AMBULATORY_CARE_PROVIDER_SITE_OTHER): Payer: Medicare Other

## 2022-07-19 ENCOUNTER — Ambulatory Visit (INDEPENDENT_AMBULATORY_CARE_PROVIDER_SITE_OTHER): Payer: Medicare Other | Admitting: Vascular Surgery

## 2022-07-19 VITALS — BP 131/65 | HR 69 | Resp 17 | Ht 62.0 in | Wt 148.2 lb

## 2022-07-19 DIAGNOSIS — I6523 Occlusion and stenosis of bilateral carotid arteries: Secondary | ICD-10-CM

## 2022-07-19 DIAGNOSIS — E785 Hyperlipidemia, unspecified: Secondary | ICD-10-CM | POA: Diagnosis not present

## 2022-07-19 DIAGNOSIS — I10 Essential (primary) hypertension: Secondary | ICD-10-CM

## 2022-07-19 DIAGNOSIS — I70211 Atherosclerosis of native arteries of extremities with intermittent claudication, right leg: Secondary | ICD-10-CM

## 2022-07-19 DIAGNOSIS — I7025 Atherosclerosis of native arteries of other extremities with ulceration: Secondary | ICD-10-CM | POA: Diagnosis not present

## 2022-07-19 NOTE — Progress Notes (Signed)
MRN : [id number]  Bryan Welch is a 86 y.o. (Mar 07, 1936) male who presents with chief complaint of No chief complaint on file. Marland Kitchen  History of Present Illness: Patient follows up for multiple vascular issues.  He is doing fairly well today.  His biggest issue current is some right wrist issues and he is in a splint now.  He denies any focal neurologic symptoms. Specifically, the patient denies amaurosis fugax, speech or swallowing difficulties, or arm or leg weakness or numbness.  He is about 9 years status post right carotid endarterectomy.  Duplex today shows some increase in velocities that would be consistent with a greater than 50% stenosis in both common carotid arteries although significantly more on the left than the right.  The right internal carotid artery has 1 to 39% stenosis in the left internal carotid artery has 40 to 59% stenosis. He is also seen for peripheral arterial disease.  He has had multiple revascularizations in the past with the most recent being about 10 to 11 months ago on the right side.  He lost toe around that time and was found to have reduced perfusion and that was why revascularization was performed.  Since that time, he is done well.  No lifestyle limiting claudication, ischemic rest pain, or ulceration currently. ABIs today are 0.99 on the right and 0.85 on the left with biphasic waveforms and normal digital pressures and waveforms bilaterally  Current Outpatient Medications  Medication Sig Dispense Refill   acetaminophen (TYLENOL) 500 MG tablet Take 1,000 mg by mouth every 6 (six) hours as needed for moderate pain.      ALPRAZolam (XANAX) 0.25 MG tablet Take 0.25 mg by mouth at bedtime.     aspirin EC 81 MG tablet Take 81 mg daily by mouth.      Cholecalciferol 25 MCG (1000 UT) capsule Take 2,000 Units by mouth daily.     diphenhydrAMINE (BENADRYL) 25 mg capsule Take 25 mg at bedtime by mouth.      famotidine (PEPCID) 10 MG tablet Take 10 mg by mouth daily.       hydrochlorothiazide (HYDRODIURIL) 25 MG tablet Take 1 tablet (25 mg total) by mouth daily. 30 tablet 0   pravastatin (PRAVACHOL) 20 MG tablet Take 20 mg daily by mouth.      tamsulosin (FLOMAX) 0.4 MG CAPS capsule Take 0.4 mg by mouth daily.   3   XARELTO 20 MG TABS tablet TAKE 1 TABLET BY MOUTH EVERY DAY *STOP ELIQUIS* 90 tablet 2   gabapentin (NEURONTIN) 300 MG capsule Take by mouth.     gabapentin (NEURONTIN) 300 MG capsule Take by mouth.     No current facility-administered medications for this visit.    Past Medical History:  Diagnosis Date   Arthritis    Toes   Coronary artery disease    History of hiatal hernia    Hyperlipidemia    Hypertension    Kidney disorder    "right kidney doesn't work"   Presence of stent in artery    "4 in legs, 1 in neck"   Vascular abnormality    Wears dentures    full upper and lower    Past Surgical History:  Procedure Laterality Date   AMPUTATION TOE Right 09/16/2021   Procedure: AMPUTATION TOE;  Surgeon: Rosetta Posner, DPM;  Location: [facility];  Service: Podiatry;  Laterality: Right;   BACK SURGERY     CAROTID STENT Right    CATARACT EXTRACTION W/PHACO Left  06/17/2019   Procedure: CATARACT EXTRACTION PHACO AND INTRAOCULAR LENS PLACEMENT ([place]) LEFT;  Surgeon: Eulogio Bear, MD;  Location: [place];  Service: Ophthalmology;  Laterality: Left;   CATARACT EXTRACTION W/PHACO Right 07/08/2019   Procedure: CATARACT EXTRACTION PHACO AND INTRAOCULAR LENS PLACEMENT ([facility]) right  00:39.2  10.8%  5.02  ;  Surgeon: Eulogio Bear, MD;  Location: [place];  Service: Ophthalmology;  Laterality: Right;   LEG SURGERY Bilateral    stent placement   LOWER EXTREMITY ANGIOGRAPHY Right 08/19/2021   Procedure: LOWER EXTREMITY ANGIOGRAPHY;  Surgeon: Algernon Huxley, MD;  Location: [place] [facility];  Service: Cardiovascular;  Laterality: Right;   PERIPHERAL VASCULAR BALLOON ANGIOPLASTY Right 09/21/2017   Procedure:  PERIPHERAL VASCULAR BALLOON ANGIOPLASTY;  Surgeon: Algernon Huxley, MD;  Location: [place] [facility];  Service: Cardiovascular;  Laterality: Right;   REVERSE SHOULDER ARTHROPLASTY Right 11/26/2019   Procedure: REVERSE SHOULDER ARTHROPLASTY;  Surgeon: Corky Mull, MD;  Location: [facility];  Service: Orthopedics;  Laterality: Right;     Social History   Tobacco Use   Smoking status: Former    Types: Cigarettes    Quit date: 2001    Years since quitting: 22.7   Smokeless tobacco: Never  Vaping Use   Vaping Use: Never used  Substance Use Topics   Alcohol use: Not Currently   Drug use: No      Family History  Problem Relation Age of Onset   Cancer Mother    Cancer Father   No bleeding or clotting disorders  Allergies  Allergen Reactions   Codeine Itching    REVIEW OF SYSTEMS (Negative unless checked)   Constitutional: [] Weight loss  [] Fever  [] Chills Cardiac: [] Chest pain   [] Chest pressure   [] Palpitations   [] Shortness of breath when laying flat   [] Shortness of breath at rest   [] Shortness of breath with exertion. Vascular:  [] Pain in legs with walking   [] Pain in legs at rest   [] Pain in legs when laying flat   [x] Claudication   [] Pain in feet when walking  [] Pain in feet at rest  [] Pain in feet when laying flat   [] History of DVT   [] Phlebitis   [] Swelling in legs   [] Varicose veins   [] Non-healing ulcers Pulmonary:   [] Uses home oxygen   [] Productive cough   [] Hemoptysis   [] Wheeze  [] COPD   [] Asthma Neurologic:  [] Dizziness  [] Blackouts   [] Seizures   [] History of stroke   [] History of TIA  [] Aphasia   [] Temporary blindness   [] Dysphagia   [] Weakness or numbness in arms   [] Weakness or numbness in legs Musculoskeletal:  [x] Arthritis   [] Joint swelling   [x] Joint pain   [] Low back pain Hematologic:  [] Easy bruising  [] Easy bleeding   [] Hypercoagulable state   [] Anemic   Gastrointestinal:  [] Blood in stool   [] Vomiting blood  [] Gastroesophageal reflux/heartburn    [] Abdominal pain Genitourinary:  [] Chronic kidney disease   [] Difficult urination  [] Frequent urination  [] Burning with urination   [] Hematuria Skin:  [] Rashes   [] Ulcers   [] Wounds Psychological:  [] History of anxiety   []  History of major depression.  Physical Examination  Vitals:   07/19/22 1000  BP: 131/65  Pulse: 69  Resp: 17  Weight: 148 lb 3.2 oz (67.2 kg)  Height: 5\' 2"  (1.575 m)   Body mass index is 27.11 kg/m. Gen:  WD/WN, NAD.  Appears younger than stated age Head: [place]/AT, No temporalis wasting.  Ear/Nose/Throat: Hearing grossly intact, nares w/o erythema or drainage, trachea midline Eyes: Conjunctiva clear. Sclera non-icteric Neck: Supple.  Soft right carotid bruit, louder left carotid bruit  Pulmonary:  Good air movement, equal and clear to auscultation bilaterally.  Cardiac: RRR, No JVD Vascular:  Vessel Right Left  Radial Palpable Palpable                          PT Palpable Palpable  DP Palpable Palpable   Gastrointestinal: soft, non-tender/non-distended. No guarding/reflex.  Musculoskeletal: M/S 5/5 throughout.  No deformity or atrophy.  Right wrist is in a splint.  No significant lower extremity edema. Neurologic: CN 2-12 intact. Sensation grossly intact in extremities.  Symmetrical.  Speech is fluent. Motor exam as listed above. Psychiatric: Judgment intact, Mood & affect appropriate for pt's clinical situation. Dermatologic: No rashes or ulcers noted.  No cellulitis or open wounds.     CBC Lab Results  Component Value Date   WBC 10.9 (H) 08/24/2021   HGB 15.3 08/24/2021   HCT 44.2 08/24/2021   MCV 87.7 08/24/2021   PLT 221 08/24/2021    BMET    Component Value Date/Time   NA 132 (L) 08/24/2021 1221   NA 141 03/22/2013 0237   K 3.6 08/24/2021 1221   K 4.3 03/22/2013 0237   CL 91 (L) 08/24/2021 1221   CL 110 (H) 03/22/2013 0237   CO2 28 08/24/2021 1221   CO2 24 03/22/2013 0237   GLUCOSE 146 (H) 08/24/2021 1221   GLUCOSE 101 (H)  03/22/2013 0237   BUN 33 (H) 08/24/2021 1221   BUN 19 (H) 03/22/2013 0237   CREATININE 1.94 (H) 08/24/2021 1221   CREATININE 1.53 (H) 03/22/2013 0237   CALCIUM 9.5 08/24/2021 1221   CALCIUM 8.1 (L) 03/22/2013 0237   GFRNONAA 34 (L) 08/24/2021 1221   GFRNONAA 44 (L) 03/22/2013 0237   GFRAA 43 (L) 11/21/2019 0911   GFRAA 51 (L) 03/22/2013 0237   CrCl cannot be calculated (Patient's most recent lab result is older than the maximum 21 days allowed.).  COAG Lab Results  Component Value Date   INR 2.0 (H) 11/21/2019   INR 1.03 09/22/2017   INR 1.00 09/21/2017    Radiology No results found.   Assessment/Plan Hyperlipidemia lipid control important in reducing the progression of atherosclerotic disease. Continue statin therapy     Essential hypertension, benign blood pressure control important in reducing the progression of atherosclerotic disease. On appropriate oral medications.  Atherosclerosis of native arteries of extremity with intermittent claudication (HCC) ABIs today are 0.99 on the right and 0.85 on the left with biphasic waveforms and normal digital pressures and waveforms bilaterally.  Doing well about 10 months after right lower extremity revascularization.  Previous revascularization about a decade ago as well.  Recheck in 6 months and then may be able to go to an annual basis after that.  Continue current medical regimen.  Carotid stenosis Duplex today shows some increase in velocities that would be consistent with a greater than 50% stenosis in both common carotid arteries although significantly more on the left than the right.  The right internal carotid artery has 1 to 39% stenosis in the left internal carotid artery has 40 to 59% stenosis.  This velocities would suggest a borderline high-grade lesion on the left but he remains asymptomatic.  We will do another 37-month duplex and if he has progression of disease, we will likely get a CT scan at that  time.    Festus Barren, MD  07/19/2022 10:31 AM    This note was created with Dragon medical transcription system.  Any errors from dictation are purely unintentional

## 2022-07-19 NOTE — Assessment & Plan Note (Signed)
Duplex today shows some increase in velocities that would be consistent with a greater than 50% stenosis in both common carotid arteries although significantly more on the left than the right.  The right internal carotid artery has 1 to 39% stenosis in the left internal carotid artery has 40 to 59% stenosis.  This velocities would suggest a borderline high-grade lesion on the left but he remains asymptomatic.  We will do another 6-month duplex and if he has progression of disease, we will likely get a CT scan at that time.

## 2022-07-19 NOTE — Assessment & Plan Note (Signed)
ABIs today are 0.99 on the right and 0.85 on the left with biphasic waveforms and normal digital pressures and waveforms bilaterally.  Doing well about 10 months after right lower extremity revascularization.  Previous revascularization about a decade ago as well.  Recheck in 6 months and then may be able to go to an annual basis after that.  Continue current medical regimen.

## 2022-07-31 ENCOUNTER — Other Ambulatory Visit (INDEPENDENT_AMBULATORY_CARE_PROVIDER_SITE_OTHER): Payer: Self-pay | Admitting: Nurse Practitioner

## 2022-09-05 ENCOUNTER — Encounter (INDEPENDENT_AMBULATORY_CARE_PROVIDER_SITE_OTHER): Payer: Self-pay

## 2023-01-17 ENCOUNTER — Ambulatory Visit (INDEPENDENT_AMBULATORY_CARE_PROVIDER_SITE_OTHER): Payer: Medicare Other | Admitting: Vascular Surgery

## 2023-01-17 ENCOUNTER — Ambulatory Visit (INDEPENDENT_AMBULATORY_CARE_PROVIDER_SITE_OTHER): Payer: Medicare Other

## 2023-01-17 ENCOUNTER — Encounter (INDEPENDENT_AMBULATORY_CARE_PROVIDER_SITE_OTHER): Payer: Self-pay | Admitting: Vascular Surgery

## 2023-01-17 VITALS — BP 138/64 | HR 72 | Resp 18 | Ht 66.0 in | Wt 150.0 lb

## 2023-01-17 DIAGNOSIS — I6523 Occlusion and stenosis of bilateral carotid arteries: Secondary | ICD-10-CM

## 2023-01-17 DIAGNOSIS — I10 Essential (primary) hypertension: Secondary | ICD-10-CM | POA: Diagnosis not present

## 2023-01-17 DIAGNOSIS — I70211 Atherosclerosis of native arteries of extremities with intermittent claudication, right leg: Secondary | ICD-10-CM | POA: Diagnosis not present

## 2023-01-17 DIAGNOSIS — E785 Hyperlipidemia, unspecified: Secondary | ICD-10-CM

## 2023-01-17 DIAGNOSIS — I7143 Infrarenal abdominal aortic aneurysm, without rupture: Secondary | ICD-10-CM

## 2023-01-17 NOTE — Assessment & Plan Note (Signed)
ABIs today are 0.79 on the right and 0.66 on the left but digit pressures were normal bilaterally.  Stable and not symptomatic that is concerning.  Recheck in 6 months.

## 2023-01-17 NOTE — Assessment & Plan Note (Signed)
Check duplex at next visit.

## 2023-01-17 NOTE — Progress Notes (Signed)
MRN : [id number]  Bryan Welch is a 87 y.o. (1936-01-31) male who presents with chief complaint of  Chief Complaint  Patient presents with   Follow-up    f/u in 6 months with carotid and abi  .  History of Present Illness: Patient returns today in follow up of multiple vascular issues. He is doing well.  No new complaints today.  He has neuropathy but no rest pain or ulceration.  Mild claudication.  ABIs today are 0.79 on the right and 0.66 on the left but digit pressures were normal bilaterally.   He is also followed for carotid disease.  No focal neurologic symptoms. Specifically, the patient denies amaurosis fugax, speech or swallowing difficulties, or arm or leg weakness or numbness. Carotid duplex shows velocities in the Viabahn percent range on the right and 40 to 59% range on the left without significant progression study.   Current Outpatient Medications  Medication Sig Dispense Refill   acetaminophen (TYLENOL) 500 MG tablet Take 1,000 mg by mouth every 6 (six) hours as needed for moderate pain.      ALPRAZolam (XANAX) 0.25 MG tablet Take 0.25 mg by mouth at bedtime.     aspirin EC 81 MG tablet Take 81 mg daily by mouth.      Cholecalciferol 25 MCG (1000 UT) capsule Take 2,000 Units by mouth daily.     diphenhydrAMINE (BENADRYL) 25 mg capsule Take 25 mg at bedtime by mouth.      famotidine (PEPCID) 10 MG tablet Take 10 mg by mouth daily.      hydrochlorothiazide (HYDRODIURIL) 25 MG tablet Take 1 tablet (25 mg total) by mouth daily. 30 tablet 0   pravastatin (PRAVACHOL) 20 MG tablet Take 20 mg daily by mouth.      tamsulosin (FLOMAX) 0.4 MG CAPS capsule Take 0.4 mg by mouth daily.   3   XARELTO 20 MG TABS tablet TAKE 1 TABLET BY MOUTH EVERY DAY *STOP ELIQUIS* 30 tablet 8   gabapentin (NEURONTIN) 300 MG capsule Take by mouth.     gabapentin (NEURONTIN) 300 MG capsule Take by mouth.     No current facility-administered medications for this visit.    Past Medical History:   Diagnosis Date   Arthritis    Toes   Coronary artery disease    History of hiatal hernia    Hyperlipidemia    Hypertension    Kidney disorder    "right kidney doesn't work"   Presence of stent in artery    "4 in legs, 1 in neck"   Vascular abnormality    Wears dentures    full upper and lower    Past Surgical History:  Procedure Laterality Date   AMPUTATION TOE Right 09/16/2021   Procedure: AMPUTATION TOE;  Surgeon: Caroline More, DPM;  Location: [place];  Service: Podiatry;  Laterality: Right;   BACK SURGERY     CAROTID STENT Right    CATARACT EXTRACTION W/PHACO Left 06/17/2019   Procedure: CATARACT EXTRACTION PHACO AND INTRAOCULAR LENS PLACEMENT ([place]) LEFT;  Surgeon: Eulogio Bear, MD;  Location: [place];  Service: Ophthalmology;  Laterality: Left;   CATARACT EXTRACTION W/PHACO Right 07/08/2019   Procedure: CATARACT EXTRACTION PHACO AND INTRAOCULAR LENS PLACEMENT ([facility]) right  00:39.2  10.8%  5.02  ;  Surgeon: Eulogio Bear, MD;  Location: [place];  Service: Ophthalmology;  Laterality: Right;   LEG SURGERY Bilateral    stent placement   LOWER EXTREMITY ANGIOGRAPHY Right  08/19/2021   Procedure: LOWER EXTREMITY ANGIOGRAPHY;  Surgeon: Algernon Huxley, MD;  Location: [place] [facility];  Service: Cardiovascular;  Laterality: Right;   PERIPHERAL VASCULAR BALLOON ANGIOPLASTY Right 09/21/2017   Procedure: PERIPHERAL VASCULAR BALLOON ANGIOPLASTY;  Surgeon: Algernon Huxley, MD;  Location: [place] [facility];  Service: Cardiovascular;  Laterality: Right;   REVERSE SHOULDER ARTHROPLASTY Right 11/26/2019   Procedure: REVERSE SHOULDER ARTHROPLASTY;  Surgeon: Corky Mull, MD;  Location: [facility];  Service: Orthopedics;  Laterality: Right;     Social History   Tobacco Use   Smoking status: Former    Types: Cigarettes    Quit date: 2001    Years since quitting: 23.2   Smokeless tobacco: Never  Vaping Use   Vaping Use: Never used  Substance  Use Topics   Alcohol use: Not Currently   Drug use: No      Family History  Problem Relation Age of Onset   Cancer Mother    Cancer Father   No bleeding or clotting disorders  Allergies  Allergen Reactions   Codeine Itching     REVIEW OF SYSTEMS (Negative unless checked)  Constitutional: '[]'$ Weight loss  '[]'$ Fever  '[]'$ Chills Cardiac: '[]'$ Chest pain   '[]'$ Chest pressure   '[]'$ Palpitations   '[]'$ Shortness of breath when laying flat   '[]'$ Shortness of breath at rest   '[]'$ Shortness of breath with exertion. Vascular:  '[x]'$ Pain in legs with walking   '[]'$ Pain in legs at rest   '[]'$ Pain in legs when laying flat   '[x]'$ Claudication   '[]'$ Pain in feet when walking  '[]'$ Pain in feet at rest  '[]'$ Pain in feet when laying flat   '[]'$ History of DVT   '[]'$ Phlebitis   '[]'$ Swelling in legs   '[]'$ Varicose veins   '[]'$ Non-healing ulcers Pulmonary:   '[]'$ Uses home oxygen   '[]'$ Productive cough   '[]'$ Hemoptysis   '[]'$ Wheeze  '[]'$ COPD   '[]'$ Asthma Neurologic:  '[]'$ Dizziness  '[]'$ Blackouts   '[]'$ Seizures   '[]'$ History of stroke   '[]'$ History of TIA  '[]'$ Aphasia   '[]'$ Temporary blindness   '[]'$ Dysphagia   '[]'$ Weakness or numbness in arms   '[]'$ Weakness or numbness in legs Musculoskeletal:  '[x]'$ Arthritis   '[]'$ Joint swelling   '[x]'$ Joint pain   '[]'$ Low back pain Hematologic:  '[]'$ Easy bruising  '[]'$ Easy bleeding   '[]'$ Hypercoagulable state   '[]'$ Anemic   Gastrointestinal:  '[]'$ Blood in stool   '[]'$ Vomiting blood  '[]'$ Gastroesophageal reflux/heartburn   '[]'$ Abdominal pain Genitourinary:  '[]'$ Chronic kidney disease   '[]'$ Difficult urination  '[]'$ Frequent urination  '[]'$ Burning with urination   '[]'$ Hematuria Skin:  '[]'$ Rashes   '[]'$ Ulcers   '[]'$ Wounds Psychological:  '[]'$ History of anxiety   '[]'$  History of major depression.  Physical Examination  BP 138/64 (BP Location: Left Arm)   Pulse 72   Resp 18   Ht '5\' 6"'$  (1.676 m)   Wt 150 lb (68 kg)   BMI 24.21 kg/m  Gen:  WD/WN, NAD. Appears younger than stated age. Head: [place]/AT, No temporalis wasting. Ear/Nose/Throat: Hearing grossly intact, nares w/o erythema or  drainage Eyes: Conjunctiva clear. Sclera non-icteric Neck: Supple.  Trachea midline Pulmonary:  Good air movement, no use of accessory muscles.  Cardiac: irregular Vascular:  Vessel Right Left  Radial Palpable Palpable                          PT 1+ Palpable 1+ Palpable  DP 2+ Palpable 1+ Palpable    Musculoskeletal: M/S 5/5 throughout.  No deformity or atrophy. No edema. Neurologic: Sensation  grossly intact in extremities.  Symmetrical.  Speech is fluent.  Psychiatric: Judgment intact, Mood & affect appropriate for pt's clinical situation. Dermatologic: No rashes or ulcers noted.  No cellulitis or open wounds.      Labs No results found for this or any previous visit (from the past 2160 hour(s)).  Radiology No results found.  Assessment/Plan Hyperlipidemia lipid control important in reducing the progression of atherosclerotic disease. Continue statin therapy     Essential hypertension, benign blood pressure control important in reducing the progression of atherosclerotic disease. On appropriate oral medications.  AAA (abdominal aortic aneurysm) without rupture (HCC) Check duplex at next visit.  Atherosclerosis of native arteries of extremity with intermittent claudication (HCC) ABIs today are 0.79 on the right and 0.66 on the left but digit pressures were normal bilaterally.  Stable and not symptomatic that is concerning.  Recheck in 6 months.   Carotid stenosis Carotid duplex shows velocities in the Viabahn percent range on the right and 40 to 59% range on the left without significant progression study.  No role for intervention.  Recheck in 6 months. NO change in medications.      [name], MD  01/17/2023 1:59 PM    This note was created with Dragon medical transcription system.  Any errors from dictation are purely unintentional

## 2023-01-17 NOTE — Assessment & Plan Note (Signed)
Carotid duplex shows velocities in the Viabahn percent range on the right and 40 to 59% range on the left without significant progression study.  No role for intervention.  Recheck in 6 months. NO change in medications.

## 2023-01-23 LAB — VAS US ABI WITH/WO TBI
Left ABI: 0.66
Right ABI: 0.79

## 2023-03-06 IMAGING — CT CT CERVICAL SPINE W/O CM
3 of 4 series · 10 of 35 positions shown, 12 images · non-contrast
Comparison: 01/28/2013

CLINICAL DATA: Neck pain and right upper extremity pain. No known
injury.

EXAM:
CT CERVICAL SPINE WITHOUT CONTRAST
TECHNIQUE: Multidetector CT imaging of the cervical spine was performed without
intravenous contrast. Multiplanar CT image reconstructions were also
generated.

[Series 4: sagittal bone · sagittal · 0.31mm/px · 5 of 71 slices shown, 6 images]
[im 24/71  bone]
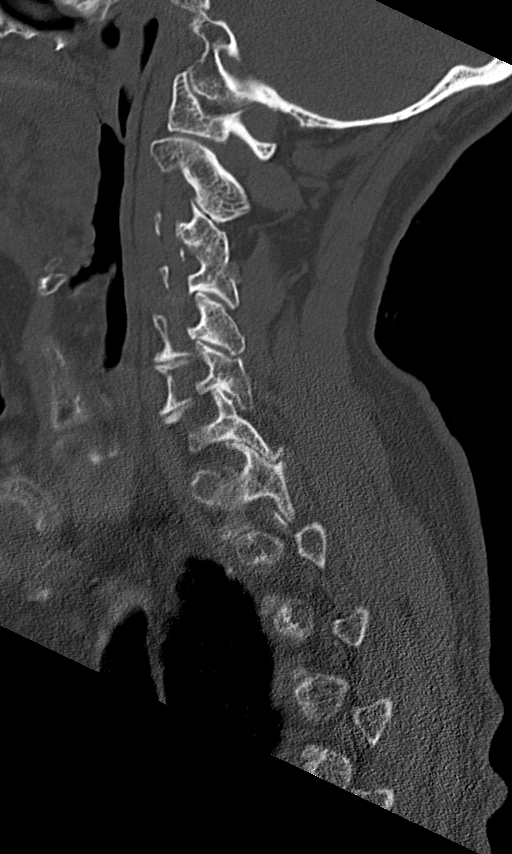
[im 30/71  bone]
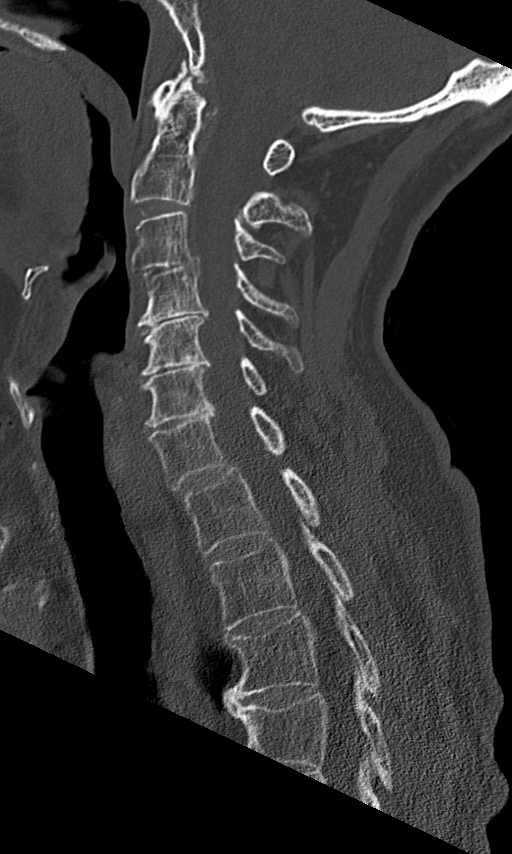
[im 36/71  soft-tissue]
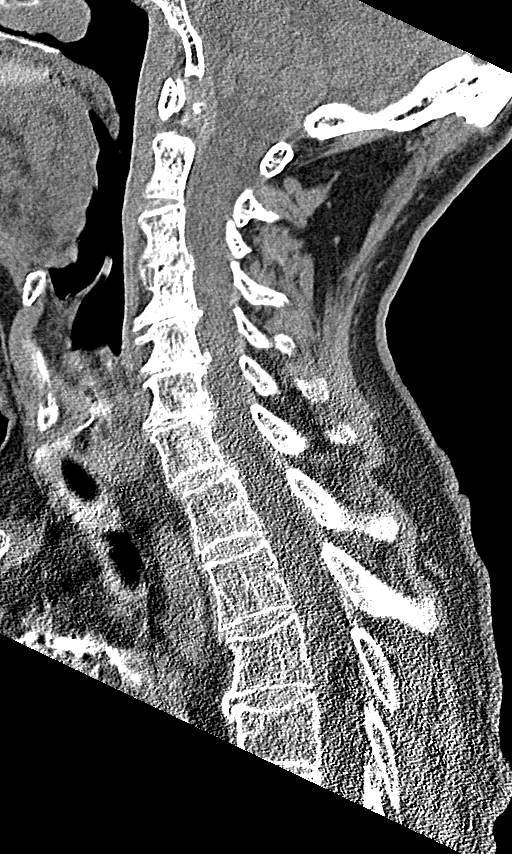
[im 36/71  bone]
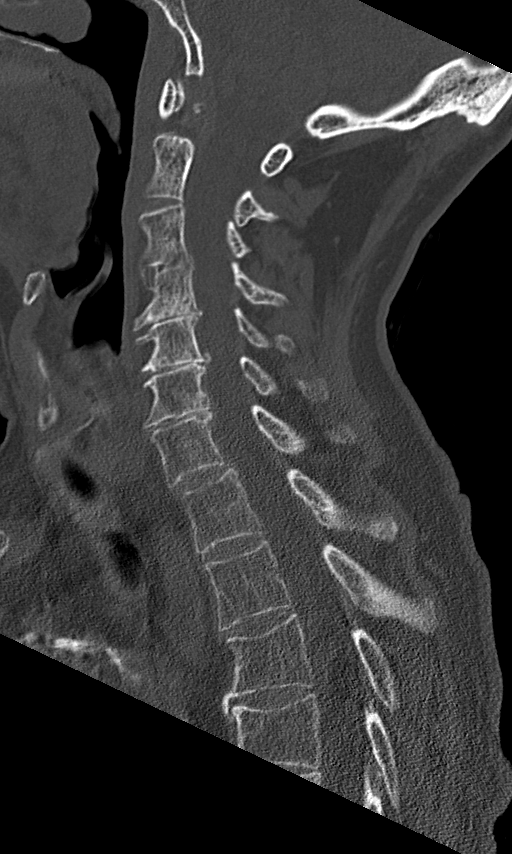
[im 41/71  bone]
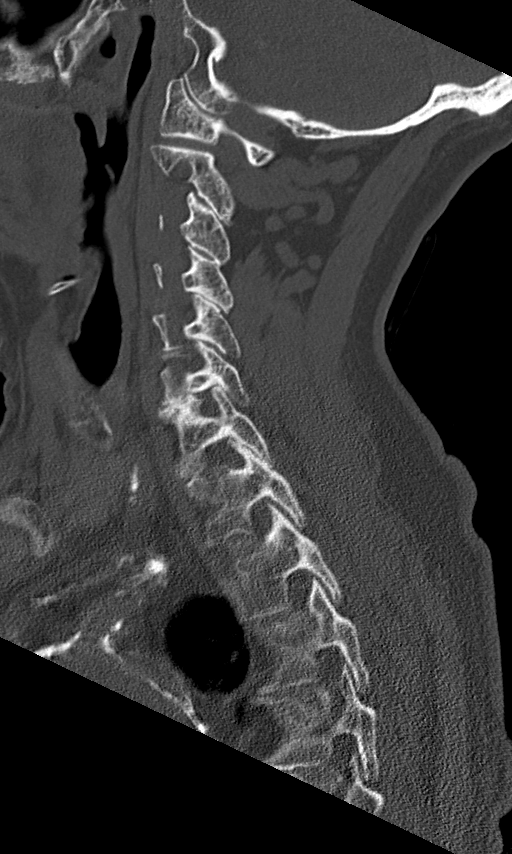
[im 47/71  bone]
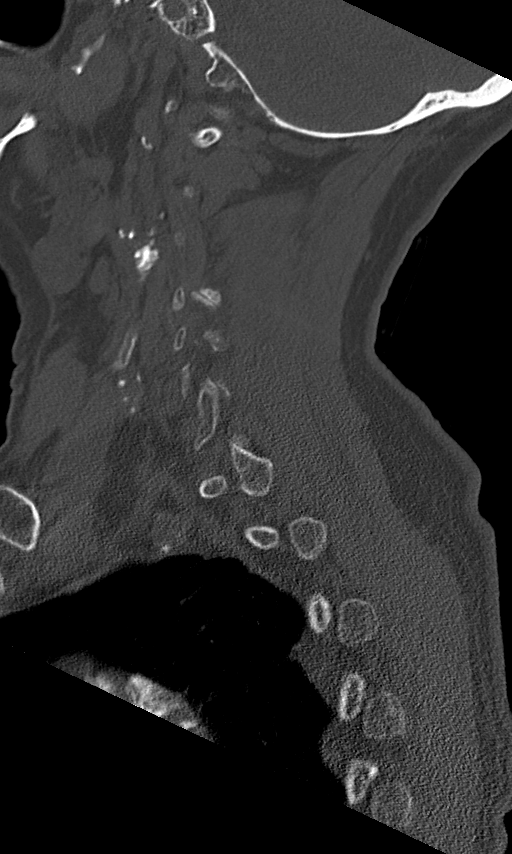

[Series 5: coronal bone · coronal · 0.26mm/px · 3 of 66 slices shown]
[im 15/66  bone]
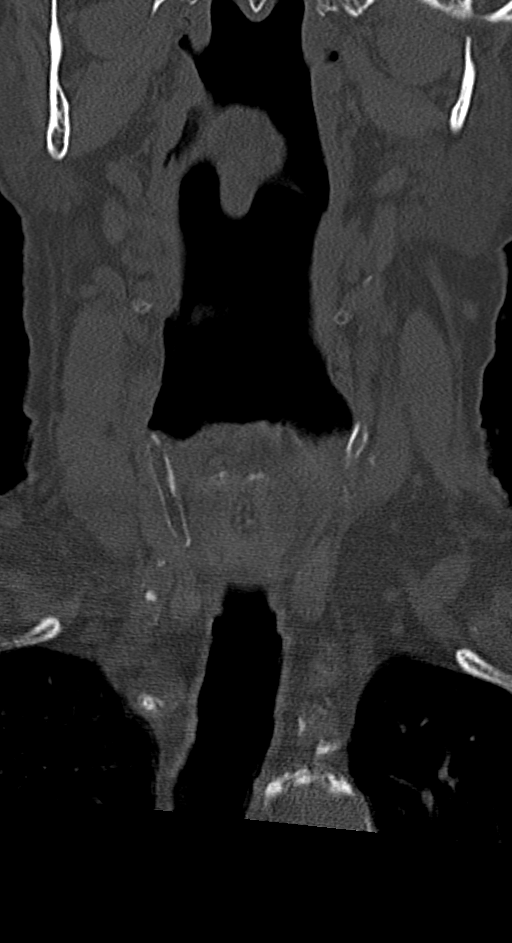
[im 27/66  bone]
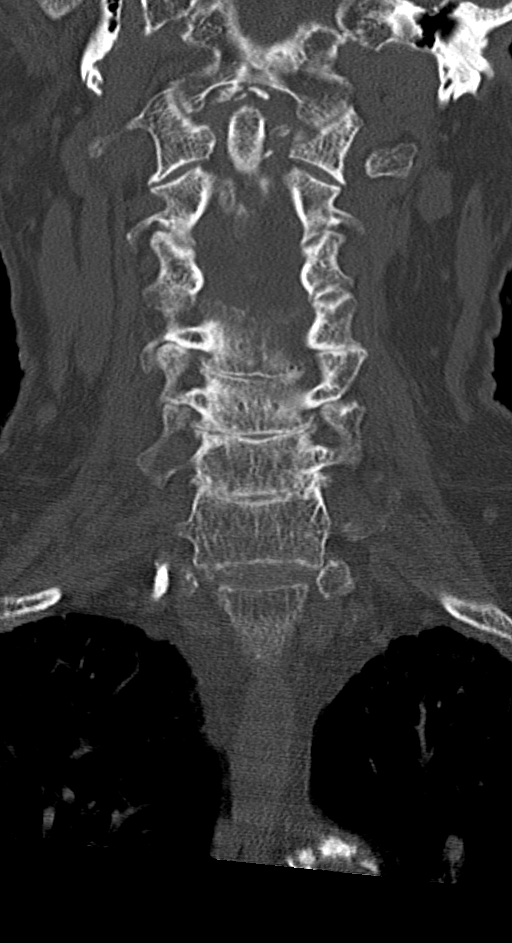
[im 39/66  bone]
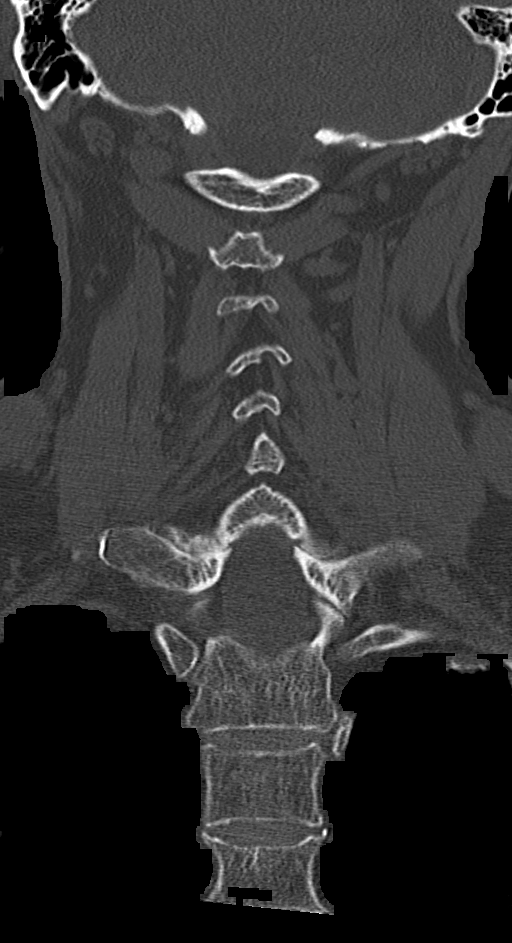

[Series 6: orthogonal bone · axial · 0.26mm/px · z∈[-286,-211]mm · 2 of 127 slices shown, 3 images]
[im 43/127  soft-tissue]
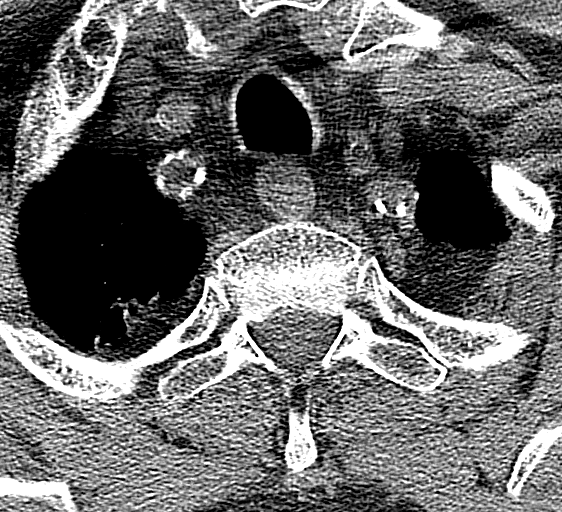
[im 43/127  bone]
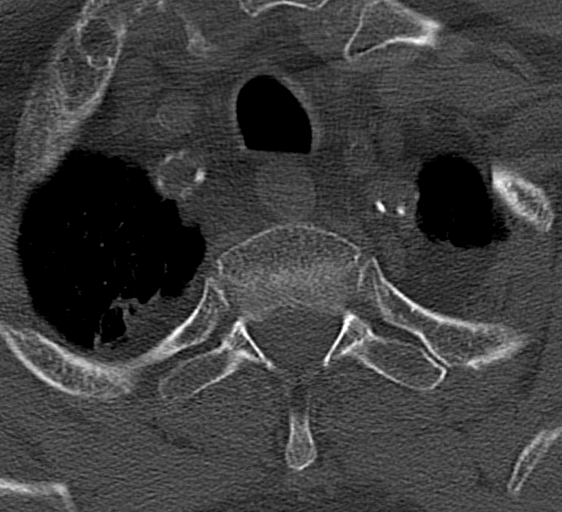
[im 85/127  bone]
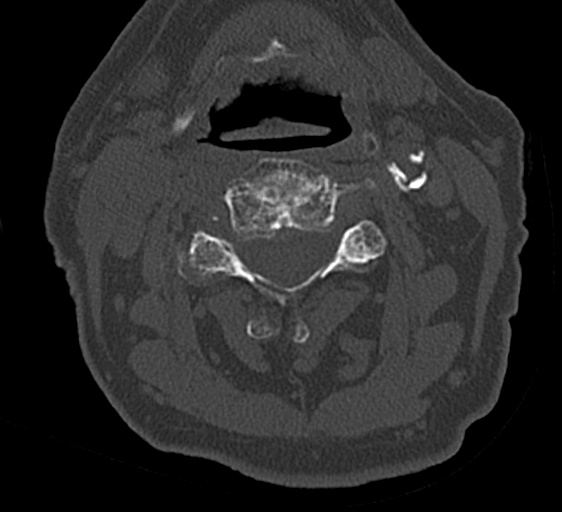

[10 of 35 positions shown; findings below may reference images not displayed]

FINDINGS: Alignment: 3 mm grade 1 anterolisthesis C7 on T1. Facet joints are
aligned without dislocation or traumatic listhesis. Dens and lateral
masses are aligned.

Skull base and vertebrae: No acute fracture. No primary bone lesion
or focal pathologic process.

Soft tissues and spinal canal: No prevertebral fluid or swelling. No
visible canal hematoma.

Disc levels: Mild-to-moderate multilevel degenerative changes
throughout the cervical spine with disc height loss and endplate
spurring from C3-4 through C6-7. Bilateral facet and uncovertebral
arthropathy. The right C3-4 facet joint is fused. Bony foraminal
narrowing at multiple levels is most severe at C5-6, right worse
than left. No evidence of high-grade canal stenosis by CT, although
this is not well evaluated.

Upper chest: Emphysematous changes within the lung apices. Biapical
pleuroparenchymal scarring.

Other: Atherosclerotic calcifications of the visualized aorta and
arch vessels. Atherosclerotic calcifications throughout the courses
of the bilateral carotid and vertebral arteries.
IMPRESSION: 1. No acute fracture or traumatic listhesis of the cervical spine.
2. Mild-to-moderate multilevel degenerative changes of the cervical
spine. Bony foraminal narrowing at multiple levels is most severe at
C5-6, right worse than left. No evidence of high-grade canal
stenosis by CT, although this is not well evaluated.

Aortic Atherosclerosis (97T6D-9VW.W) and Emphysema (97T6D-TPJ.8).

## 2023-03-22 ENCOUNTER — Emergency Department: Payer: Medicare Other

## 2023-03-22 ENCOUNTER — Inpatient Hospital Stay
Admission: EM | Admit: 2023-03-22 | Discharge: 2023-03-23 | DRG: 378 | Discharge status: Against Medical Advice | Payer: Medicare Other | Attending: Internal Medicine | Admitting: Internal Medicine

## 2023-03-22 DIAGNOSIS — Z86010 Personal history of colonic polyps: Secondary | ICD-10-CM

## 2023-03-22 DIAGNOSIS — K922 Gastrointestinal hemorrhage, unspecified: Principal | ICD-10-CM | POA: Diagnosis present

## 2023-03-22 DIAGNOSIS — I719 Aortic aneurysm of unspecified site, without rupture: Secondary | ICD-10-CM

## 2023-03-22 DIAGNOSIS — Z5329 Procedure and treatment not carried out because of patient's decision for other reasons: Secondary | ICD-10-CM | POA: Diagnosis present

## 2023-03-22 DIAGNOSIS — I70219 Atherosclerosis of native arteries of extremities with intermittent claudication, unspecified extremity: Secondary | ICD-10-CM | POA: Diagnosis present

## 2023-03-22 DIAGNOSIS — N400 Enlarged prostate without lower urinary tract symptoms: Secondary | ICD-10-CM | POA: Diagnosis present

## 2023-03-22 DIAGNOSIS — Z87891 Personal history of nicotine dependence: Secondary | ICD-10-CM | POA: Diagnosis not present

## 2023-03-22 DIAGNOSIS — E785 Hyperlipidemia, unspecified: Secondary | ICD-10-CM | POA: Diagnosis present

## 2023-03-22 DIAGNOSIS — Z961 Presence of intraocular lens: Secondary | ICD-10-CM | POA: Diagnosis present

## 2023-03-22 DIAGNOSIS — I7143 Infrarenal abdominal aortic aneurysm, without rupture: Secondary | ICD-10-CM | POA: Diagnosis present

## 2023-03-22 DIAGNOSIS — Z7982 Long term (current) use of aspirin: Secondary | ICD-10-CM

## 2023-03-22 DIAGNOSIS — D500 Iron deficiency anemia secondary to blood loss (chronic): Secondary | ICD-10-CM

## 2023-03-22 DIAGNOSIS — Z79899 Other long term (current) drug therapy: Secondary | ICD-10-CM

## 2023-03-22 DIAGNOSIS — J439 Emphysema, unspecified: Secondary | ICD-10-CM | POA: Diagnosis present

## 2023-03-22 DIAGNOSIS — Z9842 Cataract extraction status, left eye: Secondary | ICD-10-CM

## 2023-03-22 DIAGNOSIS — R54 Age-related physical debility: Secondary | ICD-10-CM | POA: Diagnosis present

## 2023-03-22 DIAGNOSIS — Z9862 Peripheral vascular angioplasty status: Secondary | ICD-10-CM

## 2023-03-22 DIAGNOSIS — Z7901 Long term (current) use of anticoagulants: Secondary | ICD-10-CM

## 2023-03-22 DIAGNOSIS — Z96611 Presence of right artificial shoulder joint: Secondary | ICD-10-CM | POA: Diagnosis present

## 2023-03-22 DIAGNOSIS — Z885 Allergy status to narcotic agent status: Secondary | ICD-10-CM | POA: Diagnosis not present

## 2023-03-22 DIAGNOSIS — D62 Acute posthemorrhagic anemia: Secondary | ICD-10-CM | POA: Diagnosis present

## 2023-03-22 DIAGNOSIS — I513 Intracardiac thrombosis, not elsewhere classified: Secondary | ICD-10-CM | POA: Diagnosis present

## 2023-03-22 DIAGNOSIS — I129 Hypertensive chronic kidney disease with stage 1 through stage 4 chronic kidney disease, or unspecified chronic kidney disease: Secondary | ICD-10-CM | POA: Diagnosis present

## 2023-03-22 DIAGNOSIS — I2510 Atherosclerotic heart disease of native coronary artery without angina pectoris: Secondary | ICD-10-CM | POA: Diagnosis present

## 2023-03-22 DIAGNOSIS — D649 Anemia, unspecified: Secondary | ICD-10-CM | POA: Diagnosis present

## 2023-03-22 DIAGNOSIS — M545 Low back pain, unspecified: Secondary | ICD-10-CM | POA: Diagnosis present

## 2023-03-22 DIAGNOSIS — Z9841 Cataract extraction status, right eye: Secondary | ICD-10-CM

## 2023-03-22 DIAGNOSIS — Z89421 Acquired absence of other right toe(s): Secondary | ICD-10-CM

## 2023-03-22 DIAGNOSIS — I10 Essential (primary) hypertension: Secondary | ICD-10-CM | POA: Diagnosis present

## 2023-03-22 DIAGNOSIS — R531 Weakness: Principal | ICD-10-CM

## 2023-03-22 DIAGNOSIS — G629 Polyneuropathy, unspecified: Secondary | ICD-10-CM | POA: Diagnosis present

## 2023-03-22 DIAGNOSIS — I714 Abdominal aortic aneurysm, without rupture, unspecified: Secondary | ICD-10-CM | POA: Diagnosis present

## 2023-03-22 DIAGNOSIS — N1831 Chronic kidney disease, stage 3a: Secondary | ICD-10-CM | POA: Diagnosis present

## 2023-03-22 DIAGNOSIS — K828 Other specified diseases of gallbladder: Secondary | ICD-10-CM | POA: Diagnosis present

## 2023-03-22 HISTORY — DX: Aortic aneurysm of unspecified site, without rupture: I71.9

## 2023-03-22 LAB — BASIC METABOLIC PANEL
Anion gap: 11 (ref 5–15)
BUN: 34 mg/dL — ABNORMAL HIGH (ref 8–23)
CO2: 21 mmol/L — ABNORMAL LOW (ref 22–32)
Calcium: 8.3 mg/dL — ABNORMAL LOW (ref 8.9–10.3)
Chloride: 105 mmol/L (ref 98–111)
Creatinine, Ser: 1.69 mg/dL — ABNORMAL HIGH (ref 0.61–1.24)
GFR, Estimated: 40 mL/min — ABNORMAL LOW (ref >60)
Glucose, Bld: 131 mg/dL — ABNORMAL HIGH (ref 70–99)
Potassium: 3.2 mmol/L — ABNORMAL LOW (ref 3.5–5.1)
Sodium: 137 mmol/L (ref 135–145)

## 2023-03-22 LAB — IRON AND TIBC
Iron: 13 ug/dL — ABNORMAL LOW (ref 45–182)
Saturation Ratios: 3 % — ABNORMAL LOW (ref 17.9–39.5)
TIBC: 454 ug/dL — ABNORMAL HIGH (ref 250–450)
UIBC: 441 ug/dL

## 2023-03-22 LAB — RETICULOCYTES
Immature Retic Fract: 28.7 % — ABNORMAL HIGH (ref 2.3–15.9)
RBC.: 2.5 MIL/uL — ABNORMAL LOW (ref 4.22–5.81)
Retic Count, Absolute: 96.3 10*3/uL (ref 19.0–186.0)
Retic Ct Pct: 3.9 % — ABNORMAL HIGH (ref 0.4–3.1)

## 2023-03-22 LAB — TYPE AND SCREEN: Antibody Screen: NEGATIVE

## 2023-03-22 LAB — HEPATIC FUNCTION PANEL
ALT: 13 U/L (ref 0–44)
AST: 26 U/L (ref 15–41)
Albumin: 4.1 g/dL (ref 3.5–5.0)
Alkaline Phosphatase: 50 U/L (ref 38–126)
Bilirubin, Direct: <0.1 mg/dL (ref 0.0–0.2)
Total Bilirubin: 0.6 mg/dL (ref 0.3–1.2)
Total Protein: 7 g/dL (ref 6.5–8.1)

## 2023-03-22 LAB — URINALYSIS, ROUTINE W REFLEX MICROSCOPIC
Bilirubin Urine: NEGATIVE
Glucose, UA: NEGATIVE mg/dL
Hgb urine dipstick: NEGATIVE
Ketones, ur: NEGATIVE mg/dL
Leukocytes,Ua: NEGATIVE
Nitrite: NEGATIVE
Protein, ur: NEGATIVE mg/dL
Specific Gravity, Urine: 1.016 (ref 1.005–1.030)
pH: 5 (ref 5.0–8.0)

## 2023-03-22 LAB — CBC
HCT: 20 % — ABNORMAL LOW (ref 39.0–52.0)
Hemoglobin: 6.1 g/dL — ABNORMAL LOW (ref 13.0–17.0)
MCH: 24.6 pg — ABNORMAL LOW (ref 26.0–34.0)
MCHC: 30.5 g/dL (ref 30.0–36.0)
MCV: 80.6 fL (ref 80.0–100.0)
Platelets: 232 10*3/uL (ref 150–400)
RBC: 2.48 MIL/uL — ABNORMAL LOW (ref 4.22–5.81)
RDW: 15.9 % — ABNORMAL HIGH (ref 11.5–15.5)
WBC: 7.8 10*3/uL (ref 4.0–10.5)
nRBC: 0 % (ref 0.0–0.2)

## 2023-03-22 LAB — HEMOGLOBIN AND HEMATOCRIT, BLOOD
HCT: 27.7 % — ABNORMAL LOW (ref 39.0–52.0)
Hemoglobin: 8.6 g/dL — ABNORMAL LOW (ref 13.0–17.0)

## 2023-03-22 LAB — PREPARE RBC (CROSSMATCH)

## 2023-03-22 LAB — TROPONIN I (HIGH SENSITIVITY)
Troponin I (High Sensitivity): 190 ng/L (ref <18)
Troponin I (High Sensitivity): 180 ng/L (ref <18)

## 2023-03-22 LAB — BPAM RBC
ISSUE DATE / TIME: 202405152007
Unit Type and Rh: 5100

## 2023-03-22 LAB — FOLATE: Folate: 26 ng/mL (ref >5.9)

## 2023-03-22 LAB — PROTIME-INR
INR: 2.5 — ABNORMAL HIGH (ref 0.8–1.2)
Prothrombin Time: 27 seconds — ABNORMAL HIGH (ref 11.4–15.2)

## 2023-03-22 LAB — FERRITIN: Ferritin: 5 ng/mL — ABNORMAL LOW (ref 24–336)

## 2023-03-22 LAB — MAGNESIUM: Magnesium: 1.7 mg/dL (ref 1.7–2.4)

## 2023-03-22 MED ORDER — IOHEXOL 350 MG/ML SOLN
75.0000 mL | Freq: Once | INTRAVENOUS | Status: AC | PRN
  Administered 2023-03-22: 75 mL via INTRAVENOUS

## 2023-03-22 MED ORDER — SENNOSIDES-DOCUSATE SODIUM 8.6-50 MG PO TABS: 1.0000 | ORAL_TABLET | Freq: Every evening | ORAL | Status: DC | PRN

## 2023-03-22 MED ORDER — ONDANSETRON HCL 4 MG PO TABS: 4.0000 mg | ORAL_TABLET | Freq: Four times a day (QID) | ORAL | Status: DC | PRN

## 2023-03-22 MED ORDER — HYDRALAZINE HCL 20 MG/ML IJ SOLN: 5.0000 mg | Freq: Four times a day (QID) | INTRAMUSCULAR | Status: DC | PRN

## 2023-03-22 MED ORDER — SODIUM CHLORIDE 0.9 % IV SOLN
10.0000 mL/h | Freq: Once | INTRAVENOUS | Status: AC
  Administered 2023-03-22: 10 mL/h via INTRAVENOUS

## 2023-03-22 MED ORDER — ACETAMINOPHEN 325 MG PO TABS: 650.0000 mg | ORAL_TABLET | Freq: Four times a day (QID) | ORAL | Status: DC | PRN

## 2023-03-22 MED ORDER — SODIUM CHLORIDE 0.9% IV SOLUTION
Freq: Once | INTRAVENOUS | Status: DC
  Filled 2023-03-22: qty 250

## 2023-03-22 MED ORDER — TAMSULOSIN HCL 0.4 MG PO CAPS
0.4000 mg | ORAL_CAPSULE | Freq: Every day | ORAL | Status: DC
  Administered 2023-03-22 – 2023-03-23 (×2): 0.4 mg via ORAL
  Filled 2023-03-22 (×2): qty 1

## 2023-03-22 MED ORDER — ACETAMINOPHEN 650 MG RE SUPP: 650.0000 mg | Freq: Four times a day (QID) | RECTAL | Status: DC | PRN

## 2023-03-22 MED ORDER — ONDANSETRON HCL 4 MG/2ML IJ SOLN: 4.0000 mg | Freq: Four times a day (QID) | INTRAMUSCULAR | Status: DC | PRN

## 2023-03-22 MED ORDER — ALPRAZOLAM 0.25 MG PO TABS: 0.2500 mg | ORAL_TABLET | Freq: Every evening | ORAL | Status: DC | PRN

## 2023-03-22 MED ORDER — PANTOPRAZOLE 80MG IVPB - SIMPLE MED
80.0000 mg | Freq: Two times a day (BID) | INTRAVENOUS | Status: DC
  Administered 2023-03-22 – 2023-03-23 (×2): 80 mg via INTRAVENOUS
  Filled 2023-03-22 (×2): qty 100

## 2023-03-22 NOTE — ED Provider Notes (Signed)
[facility] Provider Note    Event Date/Time   First MD Initiated Contact with Patient 03/22/23 1502     (approximate)   History   Weakness   HPI  Bryan Welch is a 87 y.o. male here with generalized weakness.  The patient states that over the last 2 to 3 weeks, he has had progressively worsening generalized weakness, fatigue, and difficulty getting around.  He normally walks several laps around his local [employer] daily.  He states that about 2 weeks ago, he was unable to complete this due to fatigue.  Since then, he has not been back.  He is felt very fatigued.  He is felt lightheaded.  He is felt some shortness of breath with exertion.  No history of similar symptoms.  No chest pain.  He does note that he thought he saw some blood in his stools about a week and a half ago, but is unsure whether has had any since then.  No other complaints.  No abdominal pain.     Physical Exam   Triage Vital Signs: ED Triage Vitals  Enc Vitals Group     BP 03/22/23 1109 (!) 140/51     Pulse Rate 03/22/23 1109 82     Resp 03/22/23 1109 18     Temp 03/22/23 1109 (!) 97.5 F (36.4 C)     Temp Source 03/22/23 1109 Oral     SpO2 03/22/23 1109 99 %     Weight 03/22/23 1105 149 lb 14.6 oz (68 kg)     Height 03/22/23 1105 5\' 6"  (1.676 m)     Head Circumference --      Peak Flow --      Pain Score 03/22/23 1105 8     Pain Loc --      Pain Edu? --      Excl. in GC? --     Most recent vital signs: Vitals:   03/22/23 1728 03/22/23 1743  BP: 97/77 (!) 120/49  Pulse: 86 81  Resp: (!) 23 16  Temp: 97.6 F (36.4 C) 97.8 F (36.6 C)  SpO2:  100%     General: Awake, no distress.  CV:  Good peripheral perfusion.  Regular rate and rhythm.  No murmurs. Resp:  Normal work of breathing.  Abd:  No distention.  No tenderness.  No rebound or guarding. Other:  Mild pallor noted.   ED Results / Procedures / Treatments   Labs (all labs ordered are listed, but only  abnormal results are displayed) Labs Reviewed  BASIC METABOLIC PANEL - Abnormal; Notable for the following components:      Result Value   Potassium 3.2 (*)    CO2 21 (*)    Glucose, Bld 131 (*)    BUN 34 (*)    Creatinine, Ser 1.69 (*)    Calcium 8.3 (*)    GFR, Estimated 40 (*)    All other components within normal limits  CBC - Abnormal; Notable for the following components:   RBC 2.48 (*)    Hemoglobin 6.1 (*)    HCT 20.0 (*)    MCH 24.6 (*)    RDW 15.9 (*)    All other components within normal limits  URINALYSIS, ROUTINE W REFLEX MICROSCOPIC - Abnormal; Notable for the following components:   Color, Urine YELLOW (*)    APPearance CLEAR (*)    Bacteria, UA RARE (*)    All other components within normal limits  PROTIME-INR -  Abnormal; Notable for the following components:   Prothrombin Time 27.0 (*)    INR 2.5 (*)    All other components within normal limits  IRON AND TIBC - Abnormal; Notable for the following components:   Iron 13 (*)    TIBC 454 (*)    Saturation Ratios 3 (*)    All other components within normal limits  FERRITIN - Abnormal; Notable for the following components:   Ferritin 5 (*)    All other components within normal limits  RETICULOCYTES - Abnormal; Notable for the following components:   Retic Ct Pct 3.9 (*)    RBC. 2.50 (*)    Immature Retic Fract 28.7 (*)    All other components within normal limits  TROPONIN I (HIGH SENSITIVITY) - Abnormal; Notable for the following components:   Troponin I (High Sensitivity) 190 (*)    All other components within normal limits  HEPATIC FUNCTION PANEL  FOLATE  VITAMIN B12  TYPE AND SCREEN  PREPARE RBC (CROSSMATCH)  TROPONIN I (HIGH SENSITIVITY)     EKG Normal sinus rhythm, ventricular rate 82.  PR 142, QRS 90, QTc 448.  No acute ST elevations or depressions.  EKG evidence of acute ischemia or infarct.   RADIOLOGY CT angio: diffuse severe PVD with area of aortic ulceration, diverticulosis   I also  independently reviewed and agree with radiologist interpretations.   PROCEDURES:  Critical Care performed: Yes, see critical care procedure note(s)  .Critical Care  Performed by: Shaune Pollack, MD Authorized by: Shaune Pollack, MD   Critical care provider statement:    Critical care time (minutes):  30   Critical care time was exclusive of:  Separately billable procedures and treating other patients   Critical care was necessary to treat or prevent imminent or life-threatening deterioration of the following conditions:  Cardiac failure, circulatory failure and respiratory failure   Critical care was time spent personally by me on the following activities:  Development of treatment plan with patient or surrogate, discussions with consultants, evaluation of patient's response to treatment, examination of patient, ordering and review of laboratory studies, ordering and review of radiographic studies, ordering and performing treatments and interventions, pulse oximetry, re-evaluation of patient's condition and review of old charts     MEDICATIONS ORDERED IN ED: Medications  0.9 %  sodium chloride infusion (0 mL/hr Intravenous Stopped 03/22/23 1823)  iohexol (OMNIPAQUE) 350 MG/ML injection 75 mL (75 mLs Intravenous Contrast Given 03/22/23 1645)     IMPRESSION / MDM / ASSESSMENT AND PLAN / ED COURSE  I reviewed the triage vital signs and the nursing notes.                              Differential diagnosis includes, but is not limited to, blood loss anemia from GIB, less likely RP hematoma from AAA, other anemia, deconditioning, ACS, CHF, arrhythmia.  Patient's presentation is most consistent with acute presentation with potential threat to life or bodily function.  The patient is on the cardiac monitor to evaluate for evidence of arrhythmia and/or significant heart rate changes  87 year old male here with generalized weakness and fatigue.  Primary issue is likely acute blood loss  anemia secondary to GI bleeding.  The patient has Hemoccult positive and slightly maroon-colored stool on rectal exam.  No evidence of active bleeding on CT angio which I reviewed independently.  He is hemodynamically stable.  He has no pain to suggest mesenteric ischemia.  Will transfuse for hemoglobin of 6.1, admit to medicine.  Iron panel sent.  Patient consented for transfusion.  Of note, patient has diverticulosis without acute inflammation on the scan, which I suspect is the etiology of his bleeding.  Patient also complains of back pain.  He has a an extensive history including AAA.  CT angio obtained for both his GI bleed as well as evaluation of possible retroperitoneal hematoma or other complication, and shows no evidence of active bleeding but he does seem to have what appears to be a possible penetrating ulceration in the thoracic aorta.  I discussed this with Dr. Wyn Quaker, who is the patient's vascular surgeon.  He will review the imaging and see the patient.  Unclear whether this is related to his back pain, versus worsening of his chronic back pain in the setting of decreased mobility from his anemia.   FINAL CLINICAL IMPRESSION(S) / ED DIAGNOSES   Final diagnoses:  Generalized weakness  Blood loss anemia  Ulcer of aorta (HCC)     Rx / DC Orders   ED Discharge Orders     None        Note:  This document was prepared using Dragon voice recognition software and may include unintentional dictation errors.   Shaune Pollack, MD 03/22/23 [phone]

## 2023-03-22 NOTE — Assessment & Plan Note (Signed)
At baseline 

## 2023-03-22 NOTE — ED Triage Notes (Signed)
Pt from [facility] with reports of generalized weakness, unsteady gait and lower back pain that began over the weekend.

## 2023-03-22 NOTE — Assessment & Plan Note (Signed)
No CT read of bleeding at this time

## 2023-03-22 NOTE — ED Notes (Signed)
Blood consent paper form signed and with chart.

## 2023-03-22 NOTE — Assessment & Plan Note (Addendum)
Possible upper GI bleed Iron deficiency anemia with possible complication due to slow GI bleed Type and cross 2 units of PRBC have been ordered for transfusion (1 unit ordered by EDP for transfusion and 1 unit ordered by myself) Baseline hemoglobin is 12-13 Inpatient consultation to gastroenterology via staff message to Dr. Timothy Lasso placed Protonix 80 mg IV twice daily, 5 days ordered Recheck hemoglobin and hematocrit timed recheck for 22:00 on day of admission, discussed with cross coverage provider Goal hemoglobin level is greater than 8, discussed with cross coverage provider Repeat CBC in a.m.

## 2023-03-22 NOTE — Assessment & Plan Note (Signed)
-   Hydralazine 5 mg IV every 6 hours as needed for SBP greater than 170, 2 days ordered ?

## 2023-03-22 NOTE — Assessment & Plan Note (Signed)
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 Wyn Quaker Surgeon states that if the aortic ulceration is the source of his back pain, this can be stented however patient would need acute anemia addressed prior to consideration of aortic stenting

## 2023-03-22 NOTE — H&P (Addendum)
History and Physical   Bryan Welch ZOX:[id number] DOB: 05/30/1936 DOA: 03/22/2023  PCP: Bryan Nurse, MD  Outpatient Specialists: Dr. Wyn Welch, vascular surgery specialists Patient coming from: Home  I have personally briefly reviewed patient's old medical records in [facility] EMR.  Chief Concern: Weakness and low back pain  HPI: [name] is an 87 year old male with iron deficiency anemia, BPH, hypertension, neuropathy, hyperlipidemia, currently on Xarelto, who presents emergency department for chief concerns of generalized weakness, difficulty walking and low back pain.  Vitals in the ED showed temperature 97.8, respiration rate of 16, heart rate 81, blood pressure 120/49, SpO2 100% on room air.  Serum sodium is 137, potassium 3.2, chloride 105, bicarb 21, BUN of 34, serum creatinine 1.69, nonfasting blood glucose 131, WBC 7.8, hemoglobin 6.1, platelets of 232.  Alk phos was 50.  AST was 26.  ALT was 13. High sensitive troponin was 190.  CTA chest abdomen pelvis for dissection: Was read as negative for aortic dissection or retroperitoneal hematoma.  Advanced atherosclerotic vascular disease of the aorta.  Multiple areas of aortic ulceration at the distal arch and proximal descending thoracic aorta with evidence of pedunculated mural thrombus at the distal arch.  17 x 9 mm penetrating aortic ulcer involving the descending distal thoracic aorta with mild luminal enlargement up to 3.2 cm at this level. Infrarenal abdominal aortic aneurysm measuring 4.3 cm previously 3.8 cm.  Emphysema.  Diverticular disease of the colon without acute inflammatory process.  Distended gallbladder with stones.  ED treatment: None --------------------------- At bedside, patient was eating his dinner.  He was able to tell me his name, age, location, current calendar year.  He reports that over the last 3 to 4 days he has been having worsening weakness especially with exertion.  He also endorses  bilateral lower extremity leg pain that has now resolved.  He endorses low back pain that started about 1 month ago.  He denies trauma to his person.  He endorses that Saturday evening he had 4 loose bowel movements that were tarry looking in addition to some bright red blood with wiping.  He denied his history of NSAID use and EtOH use.  He states he takes 2 Tylenol at night if he needs it.  He does not remember the last time he has had alcohol as he does not like the taste of it.  Social history: He lives at home with his wife Bryan Welch.  He denies tobacco, EtOH, recreational drug use.  He is retired.  ROS: Constitutional: no weight change, no fever ENT/Mouth: no sore throat, no rhinorrhea Eyes: no eye pain, no vision changes Cardiovascular: no chest pain, no dyspnea,  no edema, no palpitations Respiratory: no cough, no sputum, no wheezing Gastrointestinal: no nausea, no vomiting, no diarrhea, no constipation Genitourinary: no urinary incontinence, no dysuria, no hematuria Musculoskeletal: no arthralgias, no myalgias Skin: no skin lesions, no pruritus, Neuro: + weakness, no loss of consciousness, no syncope Psych: no anxiety, no depression, + decrease appetite Heme/Lymph: no bruising, no bleeding  ED Course: Discussed with emergency medicine provider, patient requiring hospitalization for chief concerns of symptomatic anemia.  Assessment/Plan  Principal Problem:   Symptomatic anemia Active Problems:   AAA (abdominal aortic aneurysm) without rupture (HCC)   Ulcer of aorta (HCC)   Hyperlipidemia   Benign essential hypertension   Atherosclerosis of native arteries of extremity with intermittent claudication (HCC)   CKD stage 3a, GFR 45-59 ml/min (HCC)   Assessment and Plan:  *  Symptomatic anemia Possible upper GI bleed Iron deficiency anemia with possible complication due to slow GI bleed Type and cross 2 units of PRBC have been ordered for transfusion (1 unit ordered by EDP for  transfusion and 1 unit ordered by myself) Baseline hemoglobin is 12-13 Inpatient consultation to gastroenterology via staff message to Dr. Timothy Welch placed Protonix 80 mg IV twice daily, 5 days ordered Recheck hemoglobin and hematocrit timed recheck for 22:00 on day of admission, discussed with cross coverage provider Goal hemoglobin level is greater than 8, discussed with cross coverage provider Repeat CBC in a.m.  Ulcer of aorta (HCC) Per CT read multiple areas of aortic ulceration at distal arch and proximal descending thoracic aorta with evidence of pedunculated mural thrombus at the distal arch 17 x 9 mm penetrating aortic ulcer descending the distal thoracic aorta with mild luminal enlargement Vascular surgeon has been consulted, Dr. Wyn Welch Surgeon states that if the aortic ulceration is the source of his back pain, this can be stented however patient would need acute anemia addressed prior to consideration of aortic stenting  AAA (abdominal aortic aneurysm) without rupture (HCC) No CT read of bleeding at this time  CKD stage 3a, GFR 45-59 ml/min (HCC) At baseline  Benign essential hypertension Hydralazine 5 mg IV every 6 hours as needed for SBP greater than 170, 2 days ordered  Chart reviewed.   DVT prophylaxis: TED hose; AM team to initiate pharmacologic DVT prophylaxis when the benefits outweigh the risk Code Status: Full code Diet: Heart healthy Family Communication: A phone call was offered, patient states that his wife Bryan Welch knows he is in the hospital Disposition Plan: Pending clinical course Consults called: Vascular surgery Admission status: Telemetry cardiac, inpatient  Past Medical History:  Diagnosis Date   Arthritis    Toes   Coronary artery disease    History of hiatal hernia    Hyperlipidemia    Hypertension    Kidney disorder    "right kidney doesn't work"   Presence of stent in artery    "4 in legs, 1 in neck"   Vascular abnormality    Wears  dentures    full upper and lower   Past Surgical History:  Procedure Laterality Date   AMPUTATION TOE Right 09/16/2021   Procedure: AMPUTATION TOE;  Surgeon: Rosetta Posner, DPM;  Location: [facility];  Service: Podiatry;  Laterality: Right;   BACK SURGERY     CAROTID STENT Right    CATARACT EXTRACTION W/PHACO Left 06/17/2019   Procedure: CATARACT EXTRACTION PHACO AND INTRAOCULAR LENS PLACEMENT (IOC) LEFT;  Surgeon: Nevada Crane, MD;  Location: [facility];  Service: Ophthalmology;  Laterality: Left;   CATARACT EXTRACTION W/PHACO Right 07/08/2019   Procedure: CATARACT EXTRACTION PHACO AND INTRAOCULAR LENS PLACEMENT (IOC) right  00:39.2  10.8%  5.02  ;  Surgeon: Nevada Crane, MD;  Location: [facility];  Service: Ophthalmology;  Laterality: Right;   LEG SURGERY Bilateral    stent placement   LOWER EXTREMITY ANGIOGRAPHY Right 08/19/2021   Procedure: LOWER EXTREMITY ANGIOGRAPHY;  Surgeon: Annice Needy, MD;  Location: [facility];  Service: Cardiovascular;  Laterality: Right;   PERIPHERAL VASCULAR BALLOON ANGIOPLASTY Right 09/21/2017   Procedure: PERIPHERAL VASCULAR BALLOON ANGIOPLASTY;  Surgeon: Annice Needy, MD;  Location: [facility];  Service: Cardiovascular;  Laterality: Right;   REVERSE SHOULDER ARTHROPLASTY Right 11/26/2019   Procedure: REVERSE SHOULDER ARTHROPLASTY;  Surgeon: Christena Flake, MD;  Location: [facility] ORS;  Service: Orthopedics;  Laterality: Right;   Social History:  reports that he quit smoking about 23 years ago. His smoking use included cigarettes. He has never used smokeless tobacco. He reports that he does not currently use alcohol. He reports that he does not use drugs.  Allergies  Allergen Reactions   Codeine Itching   Family History  Problem Relation Age of Onset   Cancer Mother    Cancer Father    Family history: Family history reviewed and not pertinent.  Prior to Admission medications   Medication Sig Start  Date End Date Taking? Authorizing Provider  acetaminophen (TYLENOL) 500 MG tablet Take 1,000 mg by mouth every 6 (six) hours as needed for moderate pain.     [provider]  ALPRAZolam Prudy Feeler) 0.25 MG tablet Take 0.25 mg by mouth at bedtime. 09/07/17   [provider]  aspirin EC 81 MG tablet Take 81 mg daily by mouth.     [provider]  Cholecalciferol 25 MCG (1000 UT) capsule Take 2,000 Units by mouth daily.    [provider]  diphenhydrAMINE (BENADRYL) 25 mg capsule Take 25 mg at bedtime by mouth.     [provider]  famotidine (PEPCID) 10 MG tablet Take 10 mg by mouth daily.     [provider]  gabapentin (NEURONTIN) 300 MG capsule Take by mouth. 10/23/20 10/23/21  [provider]  gabapentin (NEURONTIN) 300 MG capsule Take by mouth. 12/27/21 03/27/22  [provider]  hydrochlorothiazide (HYDRODIURIL) 25 MG tablet Take 1 tablet (25 mg total) by mouth daily. 03/28/21   Triplett, Rulon Eisenmenger B, FNP  pravastatin (PRAVACHOL) 20 MG tablet Take 20 mg daily by mouth.  08/30/16   [provider]  tamsulosin (FLOMAX) 0.4 MG CAPS capsule Take 0.4 mg by mouth daily.  01/06/18   [provider]  XARELTO 20 MG TABS tablet TAKE 1 TABLET BY MOUTH EVERY DAY *STOP ELIQUIS* 08/01/22   Georgiana Spinner, NP   Physical Exam: Vitals:   03/22/23 1900 03/22/23 1930 03/22/23 1952 03/22/23 2016  BP: (!) 120/45 (!) 145/88 (!) 141/52 (!) 117/55  Pulse: 76 87 78 78  Resp: 17 19 19 19   Temp:   (!) 97.5 F (36.4 C) (!) 97.5 F (36.4 C)  TempSrc:   Oral Oral  SpO2:      Weight:      Height:       Constitutional: appears age-appropriate,, frail, calm Eyes: PERRL, lids and conjunctivae normal ENMT: Mucous membranes are moist. Posterior pharynx clear of any exudate or lesions. Age-appropriate dentition. Hearing appropriate Neck: normal, supple, no masses, no thyromegaly Respiratory: clear to auscultation bilaterally, no wheezing, no  crackles. Normal respiratory effort. No accessory muscle use.  Cardiovascular: Regular rate and rhythm, no murmurs / rubs / gallops. No extremity edema. 2+ pedal pulses. No carotid bruits.  Abdomen: no tenderness, no masses palpated, no hepatosplenomegaly. Bowel sounds positive.  Musculoskeletal: no clubbing / cyanosis. No joint deformity upper and lower extremities. Good ROM, no contractures, no atrophy. Normal muscle tone.  Skin: no rashes, lesions, ulcers. No induration Neurologic: Sensation intact. Strength 5/5 in all 4.  Psychiatric: Normal judgment and insight. Alert and oriented x 3. Normal mood.   EKG: independently reviewed, showing sinus rhythm with rate of 82, QTc 448  Chest x-ray on Admission: Not indicated at this time  CT Angio Chest/Abd/Pel for Dissection W and/or Wo Contrast  Result Date: 03/22/2023 CLINICAL DATA:  Back pain anemia EXAM: CT ANGIOGRAPHY CHEST,  ABDOMEN AND PELVIS TECHNIQUE: Non-contrast CT of the chest was initially obtained. Multidetector CT imaging through the chest, abdomen and pelvis was performed using the standard protocol during bolus administration of intravenous contrast. Multiplanar reconstructed images and MIPs were obtained and reviewed to evaluate the vascular anatomy. RADIATION DOSE REDUCTION: This exam was performed according to the departmental dose-optimization program which includes automated exposure control, adjustment of the mA and/or kV according to patient size and/or use of iterative reconstruction technique. CONTRAST:  75mL OMNIPAQUE IOHEXOL 350 MG/ML SOLN COMPARISON:  CT 09/20/2017, 03/13/2013 FINDINGS: CTA CHEST FINDINGS Cardiovascular: Non contrasted images of the chest demonstrate no acute intramural hematoma. Heavily calcified thoracic aorta without aneurysm. Multiple areas of aortic ulceration at the distal arch and proximal descending thoracic aorta. Probable penetrating ulcer at the distal arch, series 5 image 49 and sagittal series 11,  image 92. 1.7 by 0.9 cm penetrating aortic ulcer at the distal descending thoracic aorta, series 5, image 106, maximal luminal diameter at this level of 3.2 cm on axial images. Irregular pedunculated mural thrombus projecting into lumen of aorta on series 5, image 58. coronary vascular calcification. Normal cardiac size. No pericardial effusion Mediastinum/Nodes: Midline trachea. No thyroid mass. No suspicious lymph nodes. Esophagus within normal limits. Lungs/Pleura: Emphysema. Scarring at the apices. No consolidation, pleural effusion, or pneumothorax. Right upper lobe ground-glass nodule measuring 8 x 9 mm on series 6, image 49, adjacent to emphysematous disease. Musculoskeletal: Sternum is intact. Chronic appearing mild superior endplate deformity at T3 and T4. Review of the MIP images confirms the above findings. CTA ABDOMEN AND PELVIS FINDINGS VASCULAR Aorta: Moderate severe aortic atherosclerosis. Infrarenal abdominal aortic aneurysm measuring 4.3 x 4.1 cm on axial series 5, image 183, previously 3.8 x 3.6 cm. Web like defect slightly cranial to this without significant stenosis, coronal series 10 image 85. This finding was present on the previous exam. No acute occlusive disease. No dissection. Celiac: Calcification at the origin. No significant stenosis. Focal ectasias of the distal trunk of the celiac measuring up to 9 mm, previously 10 mm. Moderate disease of its branch vessels. SMA: Mixed calcified and noncalcified plaque at the origin and proximal aspect of the superior mesenteric artery. Slightly greater than 50% luminal stenosis of the SMA, series 5, image 161 and series 9, image 185 secondary to noncalcified plaque. No occlusion. No aneurysm. Renals: Single right and single left renal arteries bilaterally. Mixed calcified and noncalcified plaque involving the bilateral renal arteries. Diffuse atrophy of the right renal artery. Suspect moderate diffuse stenosis of the right and left renal arteries.  IMA: Occluded with reconstitution via SMA collaterals Inflow: Moderate severe calcified and noncalcified disease of the iliac vessels without aneurysm or dissection. Right external iliac stent appears patent. Veins: No obvious venous abnormality within the limitations of this arterial phase study. Review of the MIP images confirms the above findings. NON-VASCULAR Hepatobiliary: Distended gallbladder with stones. No focal hepatic abnormality. No biliary dilatation Pancreas: Unremarkable. No pancreatic ductal dilatation or surrounding inflammatory changes. Spleen: Normal in size without focal abnormality. Adrenals/Urinary Tract: Adrenal glands are within normal limits. Atrophy of right kidney. Chronic moderate hydronephrosis of right kidney without hydroureter consistent with UPJ stenosis. Urinary bladder is unremarkable Stomach/Bowel: The stomach is nonenlarged. There is no dilated small bowel. Diverticular disease of the colon. Negative appendix. Lymphatic: No suspicious lymph nodes Reproductive: Mildly enlarged prostate with mass effect on the bladder Other: Negative for free air or free fluid. Musculoskeletal: No acute osseous abnormality. Review of the MIP images confirms  the above findings. IMPRESSION: 1. Negative for acute dissection or retroperitoneal hematoma. 2. Advanced atherosclerotic vascular disease of the aorta. Multiple areas of aortic ulceration at the distal arch and proximal descending thoracic aorta with evidence for pedunculated mural thrombus at the distal arch. 17 x 9 mm penetrating aortic ulcer involving the descending distal thoracic aorta with mild luminal enlargement up to 3.2 cm at this level. Vascular surgery consultation is recommended. 3. Infrarenal abdominal aortic aneurysm measuring up to 4.3 cm, previously 3.8 cm. Recommend follow-up every 12 months and vascular consultation. This recommendation follows ACR consensus guidelines: White Paper of the ACR Incidental Findings Committee II  on Vascular Findings. J Am Coll Radiol 2013; 10:789-794. 4. Emphysema. 8 x 9 mm ground-glass nodule within the right upper lobe. Initial follow-up with CT at 6 months is recommended to confirm persistence. If persistent, repeat CT is recommended every 2 years until 5 years of stability has been established. This recommendation follows the consensus statement: Guidelines for Management of Incidental Pulmonary Nodules Detected on CT Images: From the Fleischner Society 2017; Radiology 2017; 284:228-243. 5. Distended gallbladder with stones. 6. Diverticular disease of the colon without acute inflammatory process. 7. 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 Electronically Signed   By: Jasmine Pang M.D.   On: 03/22/2023 17:51    Labs on Admission: I have personally reviewed following labs  CBC: Recent Labs  Lab 03/22/23 1107  WBC 7.8  HGB 6.1*  HCT 20.0*  MCV 80.6  PLT 232   Basic Metabolic Panel: Recent Labs  Lab 03/22/23 1107 03/22/23 1557  NA 137  --   K 3.2*  --   CL 105  --   CO2 21*  --   GLUCOSE 131*  --   BUN 34*  --   CREATININE 1.69*  --   CALCIUM 8.3*  --   MG  --  1.7   GFR: Estimated Creatinine Clearance: 29.4 mL/min (A) (by C-G formula based on SCr of 1.69 mg/dL (H)).  Liver Function Tests: Recent Labs  Lab 03/22/23 1557  AST 26  ALT 13  ALKPHOS 50  BILITOT 0.6  PROT 7.0  ALBUMIN 4.1   Coagulation Profile: Recent Labs  Lab 03/22/23 1557  INR 2.5*   Anemia Panel: Recent Labs    03/22/23 1557 03/22/23 1806  FOLATE 26.0  --   FERRITIN 5*  --   TIBC 454*  --   IRON 13*  --   RETICCTPCT  --  3.9*   Urine analysis:    Component Value Date/Time   COLORURINE YELLOW (A) 03/22/2023 1353   APPEARANCEUR CLEAR (A) 03/22/2023 1353   APPEARANCEUR Clear 02/09/2018 1036   LABSPEC 1.016 03/22/2023 1353   LABSPEC 1.014 03/13/2013 1403   PHURINE 5.0 03/22/2023 1353    GLUCOSEU NEGATIVE 03/22/2023 1353   GLUCOSEU Negative 03/13/2013 1403   HGBUR NEGATIVE 03/22/2023 1353   BILIRUBINUR NEGATIVE 03/22/2023 1353   BILIRUBINUR Negative 02/09/2018 1036   BILIRUBINUR Negative 03/13/2013 1403   KETONESUR NEGATIVE 03/22/2023 1353   PROTEINUR NEGATIVE 03/22/2023 1353   NITRITE NEGATIVE 03/22/2023 1353   LEUKOCYTESUR NEGATIVE 03/22/2023 1353   LEUKOCYTESUR Negative 03/13/2013 1403   This document was prepared using Dragon Voice Recognition software and may include unintentional dictation errors.  Dr. Sedalia Muta [facility]  If 7PM-7AM, please contact overnight-coverage provider If 7AM-7PM, please contact day attending provider [email or web address]  03/22/2023, 8:20 PM

## 2023-03-22 NOTE — ED Notes (Signed)
Patient up to side of bed for dinner tray.

## 2023-03-22 NOTE — Hospital Course (Signed)
Mr. Yoshiharu Staebell is an 87 year old male with iron deficiency anemia, BPH, hypertension, neuropathy, hyperlipidemia, currently on Xarelto, who presents emergency department for chief concerns of generalized weakness, difficulty walking and low back pain.  Vitals in the ED showed temperature 97.8, respiration rate of 16, heart rate 81, blood pressure 120/49, SpO2 100% on room air.  Serum sodium is 137, potassium 3.2, chloride 105, bicarb 21, BUN of 34, serum creatinine 1.69, nonfasting blood glucose 131, WBC 7.8, hemoglobin 6.1, platelets of 232.  Alk phos was 50.  AST was 26.  ALT was 13. High sensitive troponin was 190.  CTA chest abdomen pelvis for dissection: Was read as negative for aortic dissection or retroperitoneal hematoma.  Advanced atherosclerotic vascular disease of the aorta.  Multiple areas of aortic ulceration at the distal arch and proximal descending thoracic aorta with evidence of pedunculated mural thrombus at the distal arch.  17 x 9 mm penetrating aortic ulcer involving the descending distal thoracic aorta with mild luminal enlargement up to 3.2 cm at this level. Infrarenal abdominal aortic aneurysm measuring 4.3 cm previously 3.8 cm.  Emphysema.  Diverticular disease of the colon without acute inflammatory process.  Distended gallbladder with stones.  ED treatment: None

## 2023-03-22 NOTE — ED Notes (Signed)
MD Cox in with patient.

## 2023-03-22 NOTE — ED Notes (Signed)
Multiple family members asking for 4 visitors for the patient. Policy explained on these occasions.

## 2023-03-23 ENCOUNTER — Other Ambulatory Visit: Payer: Self-pay

## 2023-03-23 ENCOUNTER — Encounter: Payer: Self-pay | Admitting: Internal Medicine

## 2023-03-23 DIAGNOSIS — D649 Anemia, unspecified: Secondary | ICD-10-CM

## 2023-03-23 DIAGNOSIS — I7143 Infrarenal abdominal aortic aneurysm, without rupture: Secondary | ICD-10-CM

## 2023-03-23 LAB — CBC
HCT: 27.8 % — ABNORMAL LOW (ref 39.0–52.0)
Hemoglobin: 8.7 g/dL — ABNORMAL LOW (ref 13.0–17.0)
MCH: 25.9 pg — ABNORMAL LOW (ref 26.0–34.0)
MCHC: 31.3 g/dL (ref 30.0–36.0)
MCV: 82.7 fL (ref 80.0–100.0)
Platelets: 217 10*3/uL (ref 150–400)
RBC: 3.36 MIL/uL — ABNORMAL LOW (ref 4.22–5.81)
RDW: 16.4 % — ABNORMAL HIGH (ref 11.5–15.5)
WBC: 7.6 10*3/uL (ref 4.0–10.5)
nRBC: 0.3 % — ABNORMAL HIGH (ref 0.0–0.2)

## 2023-03-23 LAB — TYPE AND SCREEN
ABO/RH(D): O POS
Unit division: 0
Unit division: 0

## 2023-03-23 LAB — BPAM RBC
Blood Product Expiration Date: 202406162359
Blood Product Expiration Date: 202406182359
ISSUE DATE / TIME: 202405151724
Unit Type and Rh: 5100

## 2023-03-23 LAB — BASIC METABOLIC PANEL
Anion gap: 9 (ref 5–15)
BUN: 35 mg/dL — ABNORMAL HIGH (ref 8–23)
CO2: 24 mmol/L (ref 22–32)
Calcium: 8 mg/dL — ABNORMAL LOW (ref 8.9–10.3)
Chloride: 105 mmol/L (ref 98–111)
Creatinine, Ser: 1.88 mg/dL — ABNORMAL HIGH (ref 0.61–1.24)
GFR, Estimated: 35 mL/min — ABNORMAL LOW (ref >60)
Glucose, Bld: 104 mg/dL — ABNORMAL HIGH (ref 70–99)
Potassium: 3.4 mmol/L — ABNORMAL LOW (ref 3.5–5.1)
Sodium: 138 mmol/L (ref 135–145)

## 2023-03-23 LAB — VITAMIN B12: Vitamin B-12: 182 pg/mL (ref 180–914)

## 2023-03-23 MED ORDER — POLYETHYLENE GLYCOL 3350 17 GM/SCOOP PO POWD
1.0000 | Freq: Once | ORAL | Status: DC
  Filled 2023-03-23: qty 255

## 2023-03-23 MED ORDER — PANTOPRAZOLE SODIUM 40 MG IV SOLR: 40.0000 mg | Freq: Two times a day (BID) | INTRAVENOUS | Status: DC

## 2023-03-23 MED ORDER — PHYTONADIONE 5 MG PO TABS
5.0000 mg | ORAL_TABLET | Freq: Once | ORAL | Status: AC
  Administered 2023-03-23: 5 mg via ORAL
  Filled 2023-03-23: qty 1

## 2023-03-23 MED ORDER — DIPHENHYDRAMINE HCL 25 MG PO CAPS: 25.0000 mg | ORAL_CAPSULE | Freq: Every day | ORAL | Status: DC

## 2023-03-23 MED ORDER — PRAVASTATIN SODIUM 20 MG PO TABS: 20.0000 mg | ORAL_TABLET | Freq: Every day | ORAL | Status: DC

## 2023-03-23 MED ORDER — VITAMIN D 25 MCG (1000 UNIT) PO TABS
2000.0000 [IU] | ORAL_TABLET | Freq: Every day | ORAL | Status: DC
  Administered 2023-03-23: 2000 [IU] via ORAL
  Filled 2023-03-23: qty 2

## 2023-03-23 MED ORDER — PEG 3350-KCL-NA BICARB-NACL 420 G PO SOLR: 2000.0000 mL | Freq: Once | ORAL | Status: DC | PRN

## 2023-03-23 MED ORDER — PROSIGHT PO TABS
1.0000 | ORAL_TABLET | Freq: Two times a day (BID) | ORAL | Status: DC
  Administered 2023-03-23: 1 via ORAL
  Filled 2023-03-23 (×3): qty 1

## 2023-03-23 MED ORDER — POLYSACCHARIDE IRON COMPLEX 150 MG PO CAPS
150.0000 mg | ORAL_CAPSULE | Freq: Every day | ORAL | Status: DC
  Administered 2023-03-23: 150 mg via ORAL
  Filled 2023-03-23: qty 1

## 2023-03-23 MED ORDER — PEG 3350-KCL-NA BICARB-NACL 420 G PO SOLR
4000.0000 mL | Freq: Once | ORAL | Status: AC
  Administered 2023-03-23: 4000 mL via ORAL
  Filled 2023-03-23: qty 4000

## 2023-03-23 NOTE — ED Notes (Signed)
Pt's family arrived to bedside. Family is anxious for pt to get his home daily meds. Wife states already talked with pharm tech over the phone. Family reassured there may be a specific reason for delay in pt receiving daily meds and that this RN will check in with attending. Attending S. Patel notified via secure chat.

## 2023-03-23 NOTE — ED Notes (Signed)
Pt has old bruising to R arm near IV; spot of swelling noted at R elbow which pt told this RN has been there for a few months; cyst-like with 1 spot that is hard and rest of area is soft and shifts around like a pocket of fluid; pt denies pain at site and can fully bend and straighten arm; pt told PT that this spot has been there since his shoulder surgery. Area is normal in color and warmth.

## 2023-03-23 NOTE — Consult Note (Signed)
Hospital Consult    Reason for Consult:  Aortic Ulceration  Requesting Physician:  Dr Londell Moh MD  MRN #:  [id number]  History of Present Illness: This is a 87 y.o. male [name] is an 87 year old male with iron deficiency anemia, BPH, hypertension, neuropathy, hyperlipidemia, currently on Xarelto, who presents emergency department for chief concerns of generalized weakness, difficulty walking and low back pain.   Exam this morning the patient is resting comfortably in bed in the emergency department.  I discussed with the patient his anemia and possible GI bleed.  He acknowledges dark bloody stools with some bright red blood over the past few days.  Endorses he knows his hemoglobin is 6.1 this morning.  Patient states someone from GI has come and spoken to him about an upper endoscopy for possibly today.  This morning he denies any chest pain or shortness of breath.  Endorses still having some low back pain but not as bad.  No other complaints overnight.  Vitals all remained stable.  Past Medical History:  Diagnosis Date   Arthritis    Toes   Coronary artery disease    History of hiatal hernia    Hyperlipidemia    Hypertension    Kidney disorder    "right kidney doesn't work"   Presence of stent in artery    "4 in legs, 1 in neck"   Vascular abnormality    Wears dentures    full upper and lower    Past Surgical History:  Procedure Laterality Date   AMPUTATION TOE Right 09/16/2021   Procedure: AMPUTATION TOE;  Surgeon: Rosetta Posner, DPM;  Location: [facility];  Service: Podiatry;  Laterality: Right;   BACK SURGERY     CAROTID STENT Right    CATARACT EXTRACTION W/PHACO Left 06/17/2019   Procedure: CATARACT EXTRACTION PHACO AND INTRAOCULAR LENS PLACEMENT (IOC) LEFT;  Surgeon: Nevada Crane, MD;  Location: [facility];  Service: Ophthalmology;  Laterality: Left;   CATARACT EXTRACTION W/PHACO Right 07/08/2019   Procedure: CATARACT EXTRACTION PHACO AND  INTRAOCULAR LENS PLACEMENT (IOC) right  00:39.2  10.8%  5.02  ;  Surgeon: Nevada Crane, MD;  Location: [facility];  Service: Ophthalmology;  Laterality: Right;   LEG SURGERY Bilateral    stent placement   LOWER EXTREMITY ANGIOGRAPHY Right 08/19/2021   Procedure: LOWER EXTREMITY ANGIOGRAPHY;  Surgeon: Annice Needy, MD;  Location: [facility];  Service: Cardiovascular;  Laterality: Right;   PERIPHERAL VASCULAR BALLOON ANGIOPLASTY Right 09/21/2017   Procedure: PERIPHERAL VASCULAR BALLOON ANGIOPLASTY;  Surgeon: Annice Needy, MD;  Location: [facility];  Service: Cardiovascular;  Laterality: Right;   REVERSE SHOULDER ARTHROPLASTY Right 11/26/2019   Procedure: REVERSE SHOULDER ARTHROPLASTY;  Surgeon: Christena Flake, MD;  Location: [facility];  Service: Orthopedics;  Laterality: Right;    Allergies  Allergen Reactions   Codeine Itching    Prior to Admission medications   Medication Sig Start Date End Date Taking? Authorizing Provider  acetaminophen (TYLENOL) 500 MG tablet Take 1,000 mg by mouth at bedtime.   Yes [provider]  ALPRAZolam (XANAX) 0.25 MG tablet Take 0.25 mg by mouth at bedtime. 09/07/17  Yes [provider]  aspirin EC 81 MG tablet Take 81 mg daily by mouth.    Yes [provider]  Cholecalciferol 25 MCG (1000 UT) capsule Take 2,000 Units by mouth daily.   Yes [provider]  diphenhydrAMINE (BENADRYL) 25 mg capsule Take 25  mg at bedtime by mouth.    Yes [provider]  gabapentin (NEURONTIN) 300 MG capsule Take 300 mg by mouth 2 (two) times daily. 12/27/21 03/22/23 Yes [provider]  hydrochlorothiazide (HYDRODIURIL) 25 MG tablet Take 1 tablet (25 mg total) by mouth daily. 03/28/21  Yes Triplett, Cari B, FNP  Multiple Vitamins-Minerals (PRESERVISION AREDS 2 PO) Take 1 tablet by mouth 2 (two) times daily.   Yes [provider]  omeprazole (PRILOSEC) 20 MG capsule Take 20 mg by mouth at  bedtime.   Yes [provider]  potassium chloride (KLOR-CON) 10 MEQ tablet Take 1 tablet by mouth daily. 02/09/23  Yes [provider]  pravastatin (PRAVACHOL) 20 MG tablet Take 20 mg by mouth at bedtime. 08/30/16  Yes [provider]  tamsulosin (FLOMAX) 0.4 MG CAPS capsule Take 0.4 mg by mouth daily.  01/06/18  Yes [provider]  XARELTO 20 MG TABS tablet TAKE 1 TABLET BY MOUTH EVERY DAY *STOP ELIQUIS* Patient taking differently: Take 20 mg by mouth daily. 08/01/22  Yes Georgiana Spinner, NP    Social History   Socioeconomic History   Marital status: Married    Spouse name: Not on file   Number of children: Not on file   Years of education: Not on file   Highest education level: Not on file  Occupational History   Not on file  Tobacco Use   Smoking status: Former    Types: Cigarettes    Quit date: 2001    Years since quitting: 23.3   Smokeless tobacco: Never  Vaping Use   Vaping Use: Never used  Substance and Sexual Activity   Alcohol use: Not Currently   Drug use: No   Sexual activity: Not on file  Other Topics Concern   Not on file  Social History Narrative   Not on file   Social Determinants of Health   Financial Resource Strain: Not on file  Food Insecurity: Not on file  Transportation Needs: Not on file  Physical Activity: Not on file  Stress: Not on file  Social Connections: Not on file  Intimate Partner Violence: Not on file     Family History  Problem Relation Age of Onset   Cancer Mother    Cancer Father     ROS: Otherwise negative unless mentioned in HPI  Physical Examination  Vitals:   03/23/23 0530 03/23/23 0534  BP: (!) 110/51   Pulse: 80   Resp: 18   Temp:  98.3 F (36.8 C)  SpO2: 96%    Body mass index is 24.2 kg/m.  General:  WDWN in NAD Gait: Not observed HENT: WNL, normocephalic Pulmonary: normal non-labored breathing, without Rales, rhonchi,  wheezing Cardiac: regular, without  Murmurs, rubs  or gallops; without carotid bruits Abdomen: Positive bowel sounds, soft, NT/ND, no masses Skin: without rashes Vascular Exam/Pulses: Lateral lower extremities with positive +2 palpable pulses.  Warm to touch. Extremities: without ischemic changes, without Gangrene , without cellulitis; without open wounds;  Musculoskeletal: no muscle wasting or atrophy  Neurologic: A&O X 3;  No focal weakness or paresthesias are detected; speech is fluent/normal Psychiatric:  The pt has Normal affect. Lymph:  Unremarkable  CBC    Component Value Date/Time   WBC 7.6 03/23/2023 0527   RBC 3.36 (L) 03/23/2023 0527   HGB 8.7 (L) 03/23/2023 0527   HGB 14.5 03/22/2013 0237   HCT 27.8 (L) 03/23/2023 0527   HCT 42.6 03/22/2013 0237   PLT 217  03/23/2023 0527   PLT 241 03/22/2013 0237   MCV 82.7 03/23/2023 0527   MCV 87 03/22/2013 0237   MCH 25.9 (L) 03/23/2023 0527   MCHC 31.3 03/23/2023 0527   RDW 16.4 (H) 03/23/2023 0527   RDW 13.5 03/22/2013 0237   LYMPHSABS 0.9 08/24/2021 1221   LYMPHSABS 1.2 03/22/2013 0237   MONOABS 0.7 08/24/2021 1221   MONOABS 0.7 03/22/2013 0237   EOSABS 0.2 08/24/2021 1221   EOSABS 0.2 03/22/2013 0237   BASOSABS 0.1 08/24/2021 1221   BASOSABS 0.1 03/22/2013 0237    BMET    Component Value Date/Time   NA 138 03/23/2023 0527   NA 141 03/22/2013 0237   K 3.4 (L) 03/23/2023 0527   K 4.3 03/22/2013 0237   CL 105 03/23/2023 0527   CL 110 (H) 03/22/2013 0237   CO2 24 03/23/2023 0527   CO2 24 03/22/2013 0237   GLUCOSE 104 (H) 03/23/2023 0527   GLUCOSE 101 (H) 03/22/2013 0237   BUN 35 (H) 03/23/2023 0527   BUN 19 (H) 03/22/2013 0237   CREATININE 1.88 (H) 03/23/2023 0527   CREATININE 1.53 (H) 03/22/2013 0237   CALCIUM 8.0 (L) 03/23/2023 0527   CALCIUM 8.1 (L) 03/22/2013 0237   GFRNONAA 35 (L) 03/23/2023 0527   GFRNONAA 44 (L) 03/22/2013 0237   GFRAA 43 (L) 11/21/2019 0911   GFRAA 51 (L) 03/22/2013 0237    COAGS: Lab Results  Component Value Date   INR 2.5 (H)  03/22/2023   INR 2.0 (H) 11/21/2019   INR 1.03 09/22/2017     Non-Invasive Vascular Imaging:   EXAM: 03/23/2023 CT ANGIOGRAPHY CHEST, ABDOMEN AND PELVIS   TECHNIQUE: Non-contrast CT of the chest was initially obtained.   Multidetector CT imaging through the chest, abdomen and pelvis was performed using the standard protocol during bolus administration of intravenous contrast. Multiplanar reconstructed images and MIPs were obtained and reviewed to evaluate the vascular anatomy.   RADIATION DOSE REDUCTION: This exam was performed according to the departmental dose-optimization program which includes automated exposure control, adjustment of the mA and/or kV according to patient size and/or use of iterative reconstruction technique.   CONTRAST:  75mL OMNIPAQUE IOHEXOL 350 MG/ML SOLN   COMPARISON:  CT 09/20/2017, 03/13/2013   FINDINGS: CTA CHEST FINDINGS   Cardiovascular: Non contrasted images of the chest demonstrate no acute intramural hematoma. Heavily calcified thoracic aorta without aneurysm. Multiple areas of aortic ulceration at the distal arch and proximal descending thoracic aorta. Probable penetrating ulcer at the distal arch, series 5 image 49 and sagittal series 11, image 92. 1.7 by 0.9 cm penetrating aortic ulcer at the distal descending thoracic aorta, series 5, image 106, maximal luminal diameter at this level of 3.2 cm on axial images. Irregular pedunculated mural thrombus projecting into lumen of aorta on series 5, image 58. coronary vascular calcification. Normal cardiac size. No pericardial effusion   Mediastinum/Nodes: Midline trachea. No thyroid mass. No suspicious lymph nodes. Esophagus within normal limits.   Lungs/Pleura: Emphysema. Scarring at the apices. No consolidation, pleural effusion, or pneumothorax. Right upper lobe ground-glass nodule measuring 8 x 9 mm on series 6, image 49, adjacent to emphysematous disease.   Musculoskeletal: Sternum  is intact. Chronic appearing mild superior endplate deformity at T3 and T4.   Review of the MIP images confirms the above findings.   CTA ABDOMEN AND PELVIS FINDINGS   VASCULAR   Aorta: Moderate severe aortic atherosclerosis. Infrarenal abdominal aortic aneurysm measuring 4.3 x 4.1 cm on axial series 5,  image 183, previously 3.8 x 3.6 cm. Web like defect slightly cranial to this without significant stenosis, coronal series 10 image 85. This finding was present on the previous exam. No acute occlusive disease. No dissection.   Celiac: Calcification at the origin. No significant stenosis. Focal ectasias of the distal trunk of the celiac measuring up to 9 mm, previously 10 mm. Moderate disease of its branch vessels.   SMA: Mixed calcified and noncalcified plaque at the origin and proximal aspect of the superior mesenteric artery. Slightly greater than 50% luminal stenosis of the SMA, series 5, image 161 and series 9, image 185 secondary to noncalcified plaque. No occlusion. No aneurysm.   Renals: Single right and single left renal arteries bilaterally. Mixed calcified and noncalcified plaque involving the bilateral renal arteries. Diffuse atrophy of the right renal artery. Suspect moderate diffuse stenosis of the right and left renal arteries.   IMA: Occluded with reconstitution via SMA collaterals   Inflow: Moderate severe calcified and noncalcified disease of the iliac vessels without aneurysm or dissection. Right external iliac stent appears patent.   Veins: No obvious venous abnormality within the limitations of this arterial phase study.   Review of the MIP images confirms the above findings.   NON-VASCULAR   Hepatobiliary: Distended gallbladder with stones. No focal hepatic abnormality. No biliary dilatation   Pancreas: Unremarkable. No pancreatic ductal dilatation or surrounding inflammatory changes.   Spleen: Normal in size without focal abnormality.    Adrenals/Urinary Tract: Adrenal glands are within normal limits. Atrophy of right kidney. Chronic moderate hydronephrosis of right kidney without hydroureter consistent with UPJ stenosis. Urinary bladder is unremarkable   Stomach/Bowel: The stomach is nonenlarged. There is no dilated small bowel. Diverticular disease of the colon. Negative appendix.   Lymphatic: No suspicious lymph nodes   Reproductive: Mildly enlarged prostate with mass effect on the bladder   Other: Negative for free air or free fluid.   Musculoskeletal: No acute osseous abnormality.   Review of the MIP images confirms the above findings.   IMPRESSION: 1. Negative for acute dissection or retroperitoneal hematoma. 2. Advanced atherosclerotic vascular disease of the aorta. Multiple areas of aortic ulceration at the distal arch and proximal descending thoracic aorta with evidence for pedunculated mural thrombus at the distal arch. 17 x 9 mm penetrating aortic ulcer involving the descending distal thoracic aorta with mild luminal enlargement up to 3.2 cm at this level. Vascular surgery consultation is recommended. 3. Infrarenal abdominal aortic aneurysm measuring up to 4.3 cm, previously 3.8 cm. Recommend follow-up every 12 months and vascular consultation. This recommendation follows ACR consensus guidelines: White Paper of the ACR Incidental Findings Committee II on Vascular Findings. J Am Coll Radiol 2013; 10:789-794. 4. Emphysema. 8 x 9 mm ground-glass nodule within the right upper lobe. Initial follow-up with CT at 6 months is recommended to confirm persistence. If persistent, repeat CT is recommended every 2 years until 5 years of stability has been established. This recommendation follows the consensus statement: Guidelines for Management of Incidental Pulmonary Nodules Detected on CT Images: From the Fleischner Society 2017; Radiology 2017; 284:228-243. 5. Distended gallbladder with stones. 6.  Diverticular disease of the colon without acute inflammatory process. 7. 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   Beta Blocker:  No. Aspirin:  No. ACEI:  No. ARB:  No. CCB use:  No Other antiplatelets/anticoagulants:  Yes.   Xarelto 20 MG daily   ASSESSMENT/PLAN: This is a 87 y.o. male who  presents to [facility] emergency department with chief concerns of generalized weakness, difficulty walking with low back pain.  On workup patient underwent CTA of chest abdomen and pelvis.  This was negative for any acute dissection but showed advanced atherosclerotic vascular disease in his aorta.  He also had multiple areas of aortic ulceration most notably at the distal arch and proximal descending thoracic aorta.  There was also evidence of pedunculated thrombus in his distal arch.  Patient was also noted to have an infrarenal abdominal aortic aneurysm measuring up to 4.3 cm.  Previously back in 2018 it was 3.8 cm.  Patient is known to Dr. Wyn Quaker and has had follow-up accordingly.  PLAN: Per vascular surgery the plan going forward will be to correct the patient's anemia/GI bleed before proceeding with any type of procedure to fix an infrarenal abdominal aortic aneurysm measuring 4.3 cm. He is known to be on Xarelto for prior stent placement in bilateral lower extremities.  Will need to resume this with any type of intervention and therefore any known bleeding is contraindicated at this point in time.  His anemia/GI bleed is controlled we will proceed with AAA repair.  I did discuss briefly with the patient what AAA repair would entail.  Due to his advanced age and what appears to be his difficulty with memory issues at this time I will plan on seeing the patient again prior to any intervention.  At that point I will get the patient's consent.   I discussed the plan in detail with Dr. Festus Barren MD and he agrees with the plan.   Marcie Bal [facility] 03/23/2023 7:27 AM

## 2023-03-23 NOTE — Progress Notes (Signed)
                                                  Against Medical Advice Patient at this time expresses desire to leave the Hospital immediately, patient has been warned that this is not Medically advisable at this time, and can result in Medical complications like Death and Disability, patient understands and accepts the risks involved and assumes full responsibilty of this decision. Patient is alert and oriented to person, place, time and situation  Risks of bleeding and death discussed with patient and with son. Patient refused to stay even with son at bedside.  This patient has also been advised that if they feel the need for further medical assistance to return here or go to any available ER or dial [phone].  Witnessed at bedside patient has signed the form  Against Medical Advice on 03/23/2023 at 10:21 PM  Donnie Mesa NP [facility] 7pm-7am - check amion for availability Pager [phone]

## 2023-03-23 NOTE — ED Notes (Signed)
Dietary stopped by to drop off pt's tray; notified pt is now in room 253.

## 2023-03-23 NOTE — Evaluation (Signed)
Physical Therapy Evaluation Patient Details Name: Bryan Welch MRN: [id number] DOB: May 08, 1936 Today's Date: 03/23/2023  History of Present Illness  Pt is an 87 y.o. male presenting to hospital 03/22/23 with c/o generalized weakness, fatigue, and difficulty getting around; also c/o LBP.  Pt admitted with symptomatic anemia, ulcer of aorta, AAA without rupture.  PMH includes iron deficiency anemia, BPH, htn, neuropathy, HLD, R toe amputation, back sx, R reverse shoulder arthroplasty.  Clinical Impression  Prior to hospital admission, pt was independent with ambulation within home (occasionally holding onto furniture) and used walking stick outside; lives with his wife in 1 level home with 2 STE B railings.  No c/o pain during session.  Currently pt is modified independent with bed mobility; independent with transfers; and CGA to ambulate 30 feet x2 (to/from bathroom) without AD use.  Pt steady without any loss of balance during sessions activities.  Pt demonstrating generalized weakness and declining to walk further d/t fatigue.  Pt would currently benefit from skilled PT to address noted impairments and functional limitations (see below for any additional details).  Upon hospital discharge, no further therapy needs anticipated.    Recommendations for follow up therapy are one component of a multi-disciplinary discharge planning process, led by the attending physician.  Recommendations may be updated based on patient status, additional functional criteria and insurance authorization.        Assistance Recommended at Discharge PRN  Patient can return home with the following  Assistance with cooking/housework;Assist for transportation;Help with stairs or ramp for entrance    Equipment Recommendations None recommended by PT  Recommendations for Other Services       Functional Status Assessment Patient has had a recent decline in their functional status and demonstrates the ability to make  significant improvements in function in a reasonable and predictable amount of time.     Precautions / Restrictions Precautions Precautions: Fall Restrictions Weight Bearing Restrictions: No      Mobility  Bed Mobility Overal bed mobility: Modified Independent             General bed mobility comments: No difficulties noted    Transfers Overall transfer level: Independent Equipment used: None               General transfer comment: steady transfer from bed    Ambulation/Gait Ambulation/Gait assistance: Min guard Gait Distance (Feet):  (30 feet x2 (to/from bathroom)) Assistive device: None Gait Pattern/deviations: Step-through pattern       General Gait Details: steady ambulation  Stairs            Wheelchair Mobility    Modified Rankin (Stroke Patients Only)       Balance Overall balance assessment: Needs assistance Sitting-balance support: No upper extremity supported, Feet supported Sitting balance-Leahy Scale: Normal Sitting balance - Comments: steady sitting reaching outside BOS   Standing balance support: No upper extremity supported, During functional activity Standing balance-Leahy Scale: Good Standing balance comment: steady ambulating but appearing a little cautious at times                             Pertinent Vitals/Pain Pain Assessment Pain Assessment: No/denies pain Vitals (HR and O2 on room air) stable and WFL throughout treatment session.    Home Living Family/patient expects to be discharged to:: Private residence Living Arrangements: Spouse/significant other Available Help at Discharge: Family;Available PRN/intermittently Type of Home: House Home Access: Stairs to enter Entrance Stairs-Rails: Right;Left Entrance  Stairs-Number of Steps: 2   Home Layout: One level Home Equipment: Grab bars - toilet;Other (comment) (walking stick)      Prior Function Prior Level of Function : Independent/Modified  Independent             Mobility Comments: Pt occasionally holding onto furniture within home; uses walking stick outside.  No recent falls reported.       Hand Dominance        Extremity/Trunk Assessment   Upper Extremity Assessment Upper Extremity Assessment: Overall WFL for tasks assessed    Lower Extremity Assessment Lower Extremity Assessment: Generalized weakness    Cervical / Trunk Assessment Cervical / Trunk Assessment: Normal  Communication   Communication: No difficulties  Cognition Arousal/Alertness: Awake/alert Behavior During Therapy: WFL for tasks assessed/performed Overall Cognitive Status: Within Functional Limits for tasks assessed                                          General Comments General comments (skin integrity, edema, etc.): R elbow swelling noted (boggy in general but also firm in spot)--pt reports chronic since after R shoulder surgery (had it drained 1x but then did not go back)--nurse notified.    Exercises     Assessment/Plan    PT Assessment Patient needs continued PT services  PT Problem List Decreased strength;Decreased activity tolerance;Decreased balance;Decreased mobility       PT Treatment Interventions DME instruction;Gait training;Stair training;Functional mobility training;Therapeutic activities;Therapeutic exercise;Balance training;Patient/family education    PT Goals (Current goals can be found in the Care Plan section)  Acute Rehab PT Goals Patient Stated Goal: to improve strength and mobility PT Goal Formulation: With patient Time For Goal Achievement: 04/06/23 Potential to Achieve Goals: Good    Frequency Min 2X/week     Co-evaluation               AM-PAC PT "6 Clicks" Mobility  Outcome Measure Help needed turning from your back to your side while in a flat bed without using bedrails?: None Help needed moving from lying on your back to sitting on the side of a flat bed without  using bedrails?: None Help needed moving to and from a bed to a chair (including a wheelchair)?: None Help needed standing up from a chair using your arms (e.g., wheelchair or bedside chair)?: None Help needed to walk in hospital room?: A Little Help needed climbing 3-5 steps with a railing? : A Little 6 Click Score: 22    End of Session Equipment Utilized During Treatment: Gait belt Activity Tolerance: Patient limited by fatigue Patient left: in bed;with call bell/phone within reach;with bed alarm set Nurse Communication: Mobility status;Precautions;Other (comment) (R elbow swelling (pt reports chronic)) PT Visit Diagnosis: Other abnormalities of gait and mobility (R26.89);Muscle weakness (generalized) (M62.81)    Time: 1610-9604 PT Time Calculation (min) (ACUTE ONLY): 17 min   Charges:   PT Evaluation $PT Eval Low Complexity: 1 Low         Carey Lafon, PT 03/23/23, 5:24 PM

## 2023-03-23 NOTE — ED Notes (Signed)
HOB adjusted for pt. Pt denies any needs currently.

## 2023-03-23 NOTE — Progress Notes (Signed)
[facility]  - [place] [facility]   PATIENT NAME: Bryan Welch    MR#:  [id number]  DATE OF BIRTH:  11-Apr-1936  SUBJECTIVE:  patient seen in the ER. No family at bedside during my evaluation. Came in with generalized weakness found to have hemoglobin of 6.1. Also had some dark stories stools. Denies hematemesis or any abdominal pain. Got blood transfusion. Hemoglobin stable. Patient denies any pain.    VITALS:  Blood pressure 105/83, pulse 73, temperature 98.2 F (36.8 C), resp. rate 18, height 5\' 6"  (1.676 m), weight 68 kg, SpO2 98 %.  PHYSICAL EXAMINATION:   GENERAL:  87 y.o.-year-old patient with no acute distress.  LUNGS: Normal breath sounds bilaterally, no wheezing CARDIOVASCULAR: S1, S2 normal. No murmur   ABDOMEN: Soft, nontender, nondistended. Bowel sounds present.  EXTREMITIES: No  edema b/l.    NEUROLOGIC: nonfocal  patient is alert and awake SKIN: No obvious rash, lesion, or ulcer.   LABORATORY PANEL:  CBC Recent Labs  Lab 03/23/23 0527  WBC 7.6  HGB 8.7*  HCT 27.8*  PLT 217    Chemistries  Recent Labs  Lab 03/22/23 1557 03/23/23 0527  NA  --  138  K  --  3.4*  CL  --  105  CO2  --  24  GLUCOSE  --  104*  BUN  --  35*  CREATININE  --  1.88*  CALCIUM  --  8.0*  MG 1.7  --   AST 26  --   ALT 13  --   ALKPHOS 50  --   BILITOT 0.6  --    Cardiac Enzymes No results for input(s): "TROPONINI" in the last 168 hours. RADIOLOGY:  CT Angio Chest/Abd/Pel for Dissection W and/or Wo Contrast  Result Date: 03/22/2023 CLINICAL DATA:  Back pain anemia EXAM: CT ANGIOGRAPHY CHEST, ABDOMEN AND PELVIS TECHNIQUE: Non-contrast CT of the chest was initially obtained. Multidetector CT imaging through the chest, abdomen and pelvis was performed using the standard protocol during bolus administration of intravenous contrast. Multiplanar reconstructed images and MIPs were obtained and reviewed to evaluate the vascular anatomy. RADIATION DOSE  REDUCTION: This exam was performed according to the departmental dose-optimization program which includes automated exposure control, adjustment of the mA and/or kV according to patient size and/or use of iterative reconstruction technique. CONTRAST:  75mL OMNIPAQUE IOHEXOL 350 MG/ML SOLN COMPARISON:  CT 09/20/2017, 03/13/2013 FINDINGS: CTA CHEST FINDINGS Cardiovascular: Non contrasted images of the chest demonstrate no acute intramural hematoma. Heavily calcified thoracic aorta without aneurysm. Multiple areas of aortic ulceration at the distal arch and proximal descending thoracic aorta. Probable penetrating ulcer at the distal arch, series 5 image 49 and sagittal series 11, image 92. 1.7 by 0.9 cm penetrating aortic ulcer at the distal descending thoracic aorta, series 5, image 106, maximal luminal diameter at this level of 3.2 cm on axial images. Irregular pedunculated mural thrombus projecting into lumen of aorta on series 5, image 58. coronary vascular calcification. Normal cardiac size. No pericardial effusion Mediastinum/Nodes: Midline trachea. No thyroid mass. No suspicious lymph nodes. Esophagus within normal limits. Lungs/Pleura: Emphysema. Scarring at the apices. No consolidation, pleural effusion, or pneumothorax. Right upper lobe ground-glass nodule measuring 8 x 9 mm on series 6, image 49, adjacent to emphysematous disease. Musculoskeletal: Sternum is intact. Chronic appearing mild superior endplate deformity at T3 and T4. Review of the MIP images confirms the above findings. CTA ABDOMEN AND PELVIS FINDINGS VASCULAR Aorta: Moderate severe aortic atherosclerosis. Infrarenal abdominal aortic  aneurysm measuring 4.3 x 4.1 cm on axial series 5, image 183, previously 3.8 x 3.6 cm. Web like defect slightly cranial to this without significant stenosis, coronal series 10 image 85. This finding was present on the previous exam. No acute occlusive disease. No dissection. Celiac: Calcification at the origin. No  significant stenosis. Focal ectasias of the distal trunk of the celiac measuring up to 9 mm, previously 10 mm. Moderate disease of its branch vessels. SMA: Mixed calcified and noncalcified plaque at the origin and proximal aspect of the superior mesenteric artery. Slightly greater than 50% luminal stenosis of the SMA, series 5, image 161 and series 9, image 185 secondary to noncalcified plaque. No occlusion. No aneurysm. Renals: Single right and single left renal arteries bilaterally. Mixed calcified and noncalcified plaque involving the bilateral renal arteries. Diffuse atrophy of the right renal artery. Suspect moderate diffuse stenosis of the right and left renal arteries. IMA: Occluded with reconstitution via SMA collaterals Inflow: Moderate severe calcified and noncalcified disease of the iliac vessels without aneurysm or dissection. Right external iliac stent appears patent. Veins: No obvious venous abnormality within the limitations of this arterial phase study. Review of the MIP images confirms the above findings. NON-VASCULAR Hepatobiliary: Distended gallbladder with stones. No focal hepatic abnormality. No biliary dilatation Pancreas: Unremarkable. No pancreatic ductal dilatation or surrounding inflammatory changes. Spleen: Normal in size without focal abnormality. Adrenals/Urinary Tract: Adrenal glands are within normal limits. Atrophy of right kidney. Chronic moderate hydronephrosis of right kidney without hydroureter consistent with UPJ stenosis. Urinary bladder is unremarkable Stomach/Bowel: The stomach is nonenlarged. There is no dilated small bowel. Diverticular disease of the colon. Negative appendix. Lymphatic: No suspicious lymph nodes Reproductive: Mildly enlarged prostate with mass effect on the bladder Other: Negative for free air or free fluid. Musculoskeletal: No acute osseous abnormality. Review of the MIP images confirms the above findings. IMPRESSION: 1. Negative for acute dissection or  retroperitoneal hematoma. 2. Advanced atherosclerotic vascular disease of the aorta. Multiple areas of aortic ulceration at the distal arch and proximal descending thoracic aorta with evidence for pedunculated mural thrombus at the distal arch. 17 x 9 mm penetrating aortic ulcer involving the descending distal thoracic aorta with mild luminal enlargement up to 3.2 cm at this level. Vascular surgery consultation is recommended. 3. Infrarenal abdominal aortic aneurysm measuring up to 4.3 cm, previously 3.8 cm. Recommend follow-up every 12 months and vascular consultation. This recommendation follows ACR consensus guidelines: White Paper of the ACR Incidental Findings Committee II on Vascular Findings. J Am Coll Radiol 2013; 10:789-794. 4. Emphysema. 8 x 9 mm ground-glass nodule within the right upper lobe. Initial follow-up with CT at 6 months is recommended to confirm persistence. If persistent, repeat CT is recommended every 2 years until 5 years of stability has been established. This recommendation follows the consensus statement: Guidelines for Management of Incidental Pulmonary Nodules Detected on CT Images: From the Fleischner Society 2017; Radiology 2017; 284:228-243. 5. Distended gallbladder with stones. 6. Diverticular disease of the colon without acute inflammatory process. 7. 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 Electronically Signed   By: Jasmine Pang M.D.   On: 03/22/2023 17:51    Assessment and Plan Noberto Ligman is an 87 year old male with iron deficiency anemia, BPH, hypertension, neuropathy, hyperlipidemia, currently on Xarelto, who presents emergency department for chief concerns of generalized weakness, difficulty walking and low back pain.   CTA chest abdomen pelvis for dissection: Was read as negative  for aortic dissection or retroperitoneal hematoma.   --Advanced atherosclerotic vascular  disease of the aorta.  -- Multiple areas of aortic ulceration at the distal arch and proximal descending thoracic aorta with evidence of pedunculated mural thrombus at the distal arch.  17 x 9 mm penetrating aortic ulcer involving the descending distal thoracic aorta with mild luminal enlargement up to 3.2 cm at this level. --Infrarenal abdominal aortic aneurysm measuring 4.3 cm previously 3.8 cm.  -- Emphysema.  -- Diverticular disease of the colon without acute inflammatory process.   --Distended gallbladder with stones.   Symptomatic anemia with possible G.I. bleed iron deficiency anemia Melena chronic anticoagulation due to peripheral arterial disease with stent (follows Dr Wyn Quaker) -- patient came in with generalized weakness of back pain.  --Found to have hemoglobin of 6.1. Received two unit blood transfusion-- hemoglobin 8.7 -- will start patient on oral iron. Will discuss about IV iron infusion. -- G.I. consultation with Dr. Timothy Lasso. Plans for EGD and colonoscopy tomorrow. -- IV Protonix BID -- transfuse blood as needed  Ulceration of the aorta as seen on CT abdomen distal abdominal aortic aneurysm without rupture -- vascular surgery consult appreciated. Follow-up further recommendations  CKD stage IIIa -- stable  Hypertension -- the soft will hold off hydrochlorothiazide    Procedures: Family communication :none today Consults : G.I., vascular CODE STATUS: full DVT Prophylaxis : SCD Level of care: Telemetry Cardiac Status is: Inpatient Remains inpatient appropriate because: GI bleed w/u    TOTAL TIME TAKING CARE OF THIS PATIENT: 35 minutes.  >50% time spent on counselling and coordination of care  Note: This dictation was prepared with Dragon dictation along with smaller phrase technology. Any transcriptional errors that result from this process are unintentional.  Enedina Finner M.D    [facility]   CC: Primary care physician; Gracelyn Nurse, MD

## 2023-03-23 NOTE — ED Notes (Signed)
Pt states stomach feels "uneasy/empty"; declined offer of nausea med stating he isn't nauseous.

## 2023-03-23 NOTE — ED Notes (Signed)
NT at bedside assisting pt.  

## 2023-03-23 NOTE — ED Notes (Signed)
Pt walking hallway with PT. 1 assist/standby with gait belt in use.

## 2023-03-23 NOTE — ED Notes (Signed)
Pt requesting beef broth for dinner; called dietary team who states they will make sure pt gets it this evening.

## 2023-03-23 NOTE — ED Notes (Signed)
Will provide other scheduled med to pt once received from pharm.

## 2023-03-23 NOTE — ED Notes (Signed)
Attending S. Patel at bedside.

## 2023-03-23 NOTE — ED Notes (Signed)
Called floor to notify there is no floor nurse yet linked to pt's chart though has been assigned room for some time. Staff states will link soon.

## 2023-03-23 NOTE — Consult Note (Addendum)
GI Inpatient Consult Note  Reason for Consult: Hematic anemia, possible upper GI bleed   Attending Requesting Consult: Amy Cox  History of Present Illness: Bryan Welch is a 87 y.o. male seen for evaluation of symptomatic anemia, possible upper GI bleed at the request of Dr. Londell Moh. 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  He is on anticoagulation with Xarelto.  Patient presented to the [facility] ED yesterday for chief complaint of generalized weakness, unsteady gait, lower back pain that began over the weekend. Upon presentation to the ED, vital signs were 140/51, 82, 18, 97.5, SpO2 99%. Labs were significant for hemoglobin 6.1, hematocrit 20, BUN 34, creatinine 1.69, GFR 40, potassium 3.2, CO2 21, glucose 131. Imaging studies: CT chest/abd/pelvis for dissection:  revealed no evidence of active GI bleed; diverticulosis,  possible penetrating ulceration of thoracic aorta, infrarenal AAA, emphysema, distended gallbladder with stones.   Since he has been in the emergency room, he had  DRE with DRE slight maroon stool reported. A consult order placed for Vascular service to address the aorta who recommended treatment of symptomatic anemia and potential treatment of thoracic ulcer as needed.  His baseline Hgb is 14 -13 range and 2 months ago 12.4 with admitting Hgb 6.1, ferritin 5.  The patient has received 2 unit PRBC, and hemoglobin is 8.7 up appropriately this morning.  The patient reports he had a large bowel movement today and he saw no evidence of fresh blood or black stool.  Patient is feeling well today without any abdominal pain.  He reports leg weakness over the last 2 weeks. He regularly walks at [facility], and has had to use a cart as he could barely make his legs work through the shopping [place]. He also had  lower back pain onset one month ago. He has had lower leg pain- now resolved.  He did note fresh blood in the commode last Saturday after a bowel movement.  He said he this was the first and last episode of rectal bleeding that he noticed. He denies black stool.  He has had no weight loss, anorexia, abdominal pain, cramping, bloating.  He has noted no change in bowel habits. He does not  have a history of hemorrhoids, and has no rectal complaints. He has had normal appetite and diet. Positive weight gain.  He denies any heartburn, dysphagia, reflux, regurgitation, nausea or vomiting.  He does take a Tums 1 every bedtime as well as over-the-counter famotidine to help prevent any heartburn from coming in the middle of the night. He does not have nocturnal reflux.  He does not take a PPI.  He stopped Advil many years ago when it caused kidney damage.  He takes Tylenol for pain.  Patient is on chronic Xarelto and took his last dose yesterday.  He ate a full dinner last night.  He has remained n.p.o. today.   Neg tob or Etoh. FH neg for GI malignancy.   Colonoscopy: 11/23/1999: Hemoccult positive stool: Impression: A few polyps, colitis, diverticulosis.  Pathology hyperplastic, no active colitis or adenoma. Last csy: 06/20/2006 colonoscopy: Follow-up adenomatous colon polyp: Impression: large mouth  diverticulosis sigmoid colon Last Endoscopy:  none   Past Medical History:  Past Medical History:  Diagnosis Date   Arthritis    Toes   Coronary artery disease    History of hiatal hernia  Hyperlipidemia    Hypertension    Kidney disorder    "right kidney doesn't work"   Presence of stent in artery    "4 in legs, 1 in neck"   Vascular abnormality    Wears dentures    full upper and lower    Problem List: Patient Active Problem List   Diagnosis Date Noted   Symptomatic anemia 03/22/2023   Ulcer of aorta (HCC) 03/22/2023   CKD stage 3a, GFR 45-59 ml/min (HCC) 03/22/2023   Atherosclerosis of  native arteries of the extremities with ulceration (HCC) 09/28/2021   Atherosclerosis of native arteries of extremity with intermittent claudication (HCC) 08/10/2021   Olecranon bursitis, right elbow 05/25/2020   UPJ (ureteropelvic junction) obstruction 09/13/2018   CRF (chronic renal failure), stage 3 (moderate) 07/25/2018   Limb ischemia 09/20/2017   Peripheral vascular disease (HCC) 04/05/2017   AAA (abdominal aortic aneurysm) without rupture (HCC) 04/05/2017   Hyperlipidemia 11/15/2016   Benign essential hypertension 11/15/2016   Carotid stenosis 11/15/2016    Past Surgical History: Past Surgical History:  Procedure Laterality Date   AMPUTATION TOE Right 09/16/2021   Procedure: AMPUTATION TOE;  Surgeon: Rosetta Posner, DPM;  Location: [facility];  Service: Podiatry;  Laterality: Right;   BACK SURGERY     CAROTID STENT Right    CATARACT EXTRACTION W/PHACO Left 06/17/2019   Procedure: CATARACT EXTRACTION PHACO AND INTRAOCULAR LENS PLACEMENT (IOC) LEFT;  Surgeon: Nevada Crane, MD;  Location: [facility];  Service: Ophthalmology;  Laterality: Left;   CATARACT EXTRACTION W/PHACO Right 07/08/2019   Procedure: CATARACT EXTRACTION PHACO AND INTRAOCULAR LENS PLACEMENT (IOC) right  00:39.2  10.8%  5.02  ;  Surgeon: Nevada Crane, MD;  Location: [facility];  Service: Ophthalmology;  Laterality: Right;   LEG SURGERY Bilateral    stent placement   LOWER EXTREMITY ANGIOGRAPHY Right 08/19/2021   Procedure: LOWER EXTREMITY ANGIOGRAPHY;  Surgeon: Annice Needy, MD;  Location: [facility];  Service: Cardiovascular;  Laterality: Right;   PERIPHERAL VASCULAR BALLOON ANGIOPLASTY Right 09/21/2017   Procedure: PERIPHERAL VASCULAR BALLOON ANGIOPLASTY;  Surgeon: Annice Needy, MD;  Location: [facility];  Service: Cardiovascular;  Laterality: Right;   REVERSE SHOULDER ARTHROPLASTY Right 11/26/2019   Procedure: REVERSE SHOULDER ARTHROPLASTY;  Surgeon: Christena Flake, MD;  Location: [facility];  Service: Orthopedics;  Laterality: Right;    Allergies: Allergies  Allergen Reactions   Codeine Itching    Home Medications: (Not in a hospital admission)  Home medication reconciliation was completed with the patient.   Scheduled Inpatient Medications:    sodium chloride   Intravenous Once   tamsulosin  0.4 mg Oral QPC supper    Continuous Inpatient Infusions:    pantoprazole (PROTONIX) IV Stopped (03/22/23 2258)    PRN Inpatient Medications:  acetaminophen **OR** acetaminophen, ALPRAZolam, hydrALAZINE, ondansetron **OR** ondansetron (ZOFRAN) IV, senna-docusate  Family History: family history includes Cancer in his father and mother.  [name] family history is negative for inflammatory bowel disorders, GI malignancy, or solid organ transplantation.  Social History:   reports that he quit smoking about 23 years ago. His smoking use included cigarettes. He has never used smokeless tobacco. He reports that he does not currently use alcohol. He reports that he does not use drugs. The patient denies ETOH, tobacco, or drug use.   Review of Systems: Constitutional: Weight is stable.  Eyes: No changes in vision. ENT: No oral lesions, sore throat.  GI: see HPI.  Heme/Lymph: No easy bruising.  CV: No chest pain.  GU: No hematuria.  Integumentary: No rashes.  Neuro: No headaches.  Psych: No depression/anxiety.  Endocrine: No heat/cold intolerance.  Allergic/Immunologic: No urticaria.  Resp: No cough, SOB.  Musculoskeletal: No joint swelling.    Physical Examination: BP (!) 110/51   Pulse 80   Temp 98.3 F (36.8 C) (Oral)   Resp 18   Ht 5\' 6"  (1.676 m)   Wt 68 kg   SpO2 96%   BMI 24.20 kg/m  Gen: NAD, alert and oriented x 4, Spry and healthy appertain  HEENT: EOMI, Neck: supple, no JVD or thyromegaly Chest: CTA bilaterally, no wheezes, crackles, or other adventitious sounds CV: RRR, pso systolic m Abd: soft, NT, ND, +BS in  all four quadrants; no HSM, guarding, rigidity, or rebound tenderness Ext: no edema Skin: no rash or lesions noted Lymph: no LAD  Data: Lab Results  Component Value Date   WBC 7.6 03/23/2023   HGB 8.7 (L) 03/23/2023   HCT 27.8 (L) 03/23/2023   MCV 82.7 03/23/2023   PLT 217 03/23/2023   Recent Labs  Lab 03/22/23 1107 03/22/23 2258 03/23/23 0527  HGB 6.1* 8.6* 8.7*   Lab Results  Component Value Date   NA 138 03/23/2023   K 3.4 (L) 03/23/2023   CL 105 03/23/2023   CO2 24 03/23/2023   BUN 35 (H) 03/23/2023   CREATININE 1.88 (H) 03/23/2023   Lab Results  Component Value Date   ALT 13 03/22/2023   AST 26 03/22/2023   ALKPHOS 50 03/22/2023   BILITOT 0.6 03/22/2023   Recent Labs  Lab 03/22/23 1557  INR 2.5*    CLINICAL DATA:  Back pain anemia   EXAM: CT ANGIOGRAPHY CHEST, ABDOMEN AND PELVIS   TECHNIQUE: Non-contrast CT of the chest was initially obtained.   Multidetector CT imaging through the chest, abdomen and pelvis was performed using the standard protocol during bolus administration of intravenous contrast. Multiplanar reconstructed images and MIPs were obtained and reviewed to evaluate the vascular anatomy.   RADIATION DOSE REDUCTION: This exam was performed according to the departmental dose-optimization program which includes automated exposure control, adjustment of the mA and/or kV according to patient size and/or use of iterative reconstruction technique.   CONTRAST:  75mL OMNIPAQUE IOHEXOL 350 MG/ML SOLN   COMPARISON:  CT 09/20/2017, 03/13/2013   FINDINGS: CTA CHEST FINDINGS   Cardiovascular: Non contrasted images of the chest demonstrate no acute intramural hematoma. Heavily calcified thoracic aorta without aneurysm. Multiple areas of aortic ulceration at the distal arch and proximal descending thoracic aorta. Probable penetrating ulcer at the distal arch, series 5 image 49 and sagittal series 11, image 92. 1.7 by 0.9 cm penetrating  aortic ulcer at the distal descending thoracic aorta, series 5, image 106, maximal luminal diameter at this level of 3.2 cm on axial images. Irregular pedunculated mural thrombus projecting into lumen of aorta on series 5, image 58. coronary vascular calcification. Normal cardiac size. No pericardial effusion   Mediastinum/Nodes: Midline trachea. No thyroid mass. No suspicious lymph nodes. Esophagus within normal limits.   Lungs/Pleura: Emphysema. Scarring at the apices. No consolidation, pleural effusion, or pneumothorax. Right upper lobe ground-glass nodule measuring 8 x 9 mm on series 6, image 49, adjacent to emphysematous disease.   Musculoskeletal: Sternum is intact. Chronic appearing mild superior endplate deformity at T3 and T4.   Review of the MIP images confirms the above findings.   CTA ABDOMEN AND PELVIS FINDINGS  VASCULAR   Aorta: Moderate severe aortic atherosclerosis. Infrarenal abdominal aortic aneurysm measuring 4.3 x 4.1 cm on axial series 5, image 183, previously 3.8 x 3.6 cm. Web like defect slightly cranial to this without significant stenosis, coronal series 10 image 85. This finding was present on the previous exam. No acute occlusive disease. No dissection.   Celiac: Calcification at the origin. No significant stenosis. Focal ectasias of the distal trunk of the celiac measuring up to 9 mm, previously 10 mm. Moderate disease of its branch vessels.   SMA: Mixed calcified and noncalcified plaque at the origin and proximal aspect of the superior mesenteric artery. Slightly greater than 50% luminal stenosis of the SMA, series 5, image 161 and series 9, image 185 secondary to noncalcified plaque. No occlusion. No aneurysm.   Renals: Single right and single left renal arteries bilaterally. Mixed calcified and noncalcified plaque involving the bilateral renal arteries. Diffuse atrophy of the right renal artery. Suspect moderate diffuse stenosis of the right  and left renal arteries.   IMA: Occluded with reconstitution via SMA collaterals   Inflow: Moderate severe calcified and noncalcified disease of the iliac vessels without aneurysm or dissection. Right external iliac stent appears patent.   Veins: No obvious venous abnormality within the limitations of this arterial phase study.   Review of the MIP images confirms the above findings.   NON-VASCULAR   Hepatobiliary: Distended gallbladder with stones. No focal hepatic abnormality. No biliary dilatation   Pancreas: Unremarkable. No pancreatic ductal dilatation or surrounding inflammatory changes.   Spleen: Normal in size without focal abnormality.   Adrenals/Urinary Tract: Adrenal glands are within normal limits. Atrophy of right kidney. Chronic moderate hydronephrosis of right kidney without hydroureter consistent with UPJ stenosis. Urinary bladder is unremarkable   Stomach/Bowel: The stomach is nonenlarged. There is no dilated small bowel. Diverticular disease of the colon. Negative appendix.   Lymphatic: No suspicious lymph nodes   Reproductive: Mildly enlarged prostate with mass effect on the bladder   Other: Negative for free air or free fluid.   Musculoskeletal: No acute osseous abnormality.   Review of the MIP images confirms the above findings.   IMPRESSION: 1. Negative for acute dissection or retroperitoneal hematoma. 2. Advanced atherosclerotic vascular disease of the aorta. Multiple areas of aortic ulceration at the distal arch and proximal descending thoracic aorta with evidence for pedunculated mural thrombus at the distal arch. 17 x 9 mm penetrating aortic ulcer involving the descending distal thoracic aorta with mild luminal enlargement up to 3.2 cm at this level. Vascular surgery consultation is recommended. 3. Infrarenal abdominal aortic aneurysm measuring up to 4.3 cm, previously 3.8 cm. Recommend follow-up every 12 months and vascular consultation.  This recommendation follows ACR consensus guidelines: White Paper of the ACR Incidental Findings Committee II on Vascular Findings. J Am Coll Radiol 2013; 10:789-794. 4. Emphysema. 8 x 9 mm ground-glass nodule within the right upper lobe. Initial follow-up with CT at 6 months is recommended to confirm persistence. If persistent, repeat CT is recommended every 2 years until 5 years of stability has been established. This recommendation follows the consensus statement: Guidelines for Management of Incidental Pulmonary Nodules Detected on CT Images: From the Fleischner Society 2017; Radiology 2017; 284:228-243. 5. Distended gallbladder with stones. 6. Diverticular disease of the colon without acute inflammatory process. 7. Atrophic right kidney with moderate chronic right UPJ obstruction   Aortic Atherosclerosis (ICD10-I70.0) and Emphysema (ICD10-J43.9).     Electronically Signed   By: Adrian Prows.D.  On: 03/22/2023 17:51   Assessment/Plan: Mr. Point is a 87 y.o. 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  He is on anticoagulation with Xarelto (last dose 03/22/23 morning).    Patient presented to the [facility] ED yesterday for chief complaint of generalized weakness, unsteady gait, lower back pain that began over the weekend. Upon presentation to the ED, vital signs were 140/51, 82, 18, 97.5, SpO2 99%. Labs were significant for hemoglobin 6.1, hematocrit 20, BUN 34, creatinine 1.69, GFR 40, potassium 3.2, CO2 21, glucose 131. Imaging studies: CT chest/abd/pelvis for dissection:  revealed no evidence of active GI bleed; diverticulosis,  possible penetrating ulceration of thoracic aorta, infrarenal AAA, emphysema, distended gallbladder with stones.   -Acute hemorrhagic symptomatic anemia- GIB suspected -  IDA- suspect acute on chronic aspect given findings - Rectal bleeding  - He has acute on chronic anemia with iron deficiency.  He has had 6 gram drop in hemoglobin from a few months ago.  - He has noted isolated episode of fresh blood in the commode last Saturday.  He reports this was his one and only event of rectal bleeding.  - Patient denied and denies any associated abdominal pain cramping, bloating, nausea, vomiting or GI symptoms otherwise.  - Chronic use of  Tums and famotidine at bedtime that helps relax his digestion at bedtime.  He denies NSAID use. He is not on a proton pump inhibitor as an outpatient -  -History diverticulosis, colon polyps per remote csy -  no history of upper or lower GI bleed.  - Chronic anticoagulation: Xarelto with last dose yesterday morning.     - Given his vague reports of upper GI symptoms at bedtime PUD and needs to be considered along with LGI bleed- painless diverticular, AVM, malignancy  Recommendations: N.p.o. for potential procedure when clinically feasible after Xarelot wash out  Protonix 40 mg IV every 12 hours next hold DVT PPx Monitor H&H.  Transfusion resuscitation as per primary team. Avoid frequent lab draws prevent lab induced anemia Supportive care and antiemetics as needed per primary care Maintain 2 sites IV access Avoid NSAIDs Monitor for GI bleed Further recommendations regarding additional study with colonoscopy. Thank you for the consult. Please call with questions or concerns.    Ulcer of aorta AAA HTN Followed by medical staff and vascular consult placed.  CKD: BUN/creatinine is at his baseline.   Amedeo Kinsman, ANP [facility] [phone]

## 2023-03-23 NOTE — ED Notes (Signed)
NuLYTELY not yet received from pharm.

## 2023-03-24 SURGERY — ESOPHAGOGASTRODUODENOSCOPY (EGD) WITH PROPOFOL
Anesthesia: General

## 2023-03-24 NOTE — Progress Notes (Signed)
At approximately 2105, patient walked to nurses station and stated that he had contacted his son and would be leaving tonight. Patient stated that he got too sick drinking the bowel prep and still felt sick and that no one understood how bad it was. Patient escorted back to room.  Patient was asked if he continued to drink the bowel prep after being instructed to stop until an alternate bowel prep could be obtained and patient confirmed that he had continued to drink the bowel prep. Patient informed that provider had been notified and an alternate bowel prep was being approved for the patient to try instead. Patient continued to state that no matter what was said or who said it, he was going home tonight and that his son was on his way to pick him up. Patient proceeded to take telemetry off and requested that IV be removed. Patient educated on necessity of staying for his EGD and colonoscopy tomorrow and the risks of not having the procedure performed. Patient stated that he understood.  On-call provider at nurses station, witnessed patient asking to leave, and educated patient again about risks of leaving without having procedure performed. Patient again stated he understood and would be leaving. Patient alluded to being scared of upcoming procedure. IV was then removed and patient was asked to remain in room until his son could pick him up. Patient agreed to remain in room, but attempted to leave room several times. CNA remained at patient's side to ensure safety.  At approximately 2110, patient's son called and asked if patient could be given his anxiety medication until he could arrive and talk with the patient. Patient refused all medications at this time.  Son and daughter-in-law arrived at approximately 2200 and talked with patient, trying to convince him to stay for procedure. Patient still adamant about leaving. Patient, his son, and daughter-in-law all educated on risks involved with leaving AMA.  On-call provider also educated patient and family on the risks of leaving. Patient stated he understood and would still like to leave. Patient, son, and daughter-in-law advised to contact patient's primary care physician or to return to hospital if condition worsened. Patient stated understanding and signed provided AMA form at 2220 and left hospital with his son and daughter-in-law.

## 2023-03-24 NOTE — Progress Notes (Signed)
Patient vomited substantial amount of bowel prep at shift change. Patient stated that the bowel prep was too sweet and he could not tolerate it. Patient instructed to stop drinking bowel prep and that provider would be notified for orders for alternate bowel prep that would be more tolerable.

## 2023-04-12 ENCOUNTER — Encounter: Payer: Self-pay | Admitting: Gastroenterology

## 2023-04-12 NOTE — H&P (Signed)
Pre-Procedure H&P   Patient ID: Bryan Welch is a 87 y.o. male.  Gastroenterology Provider: Jaynie Collins, DO  Referring Provider: Tawni Pummel, PA PCP: Gracelyn Nurse, MD  Date: 04/13/2023  HPI [name] is a 87 y.o. male who presents today for Colonoscopy for Iron deficiency anemia .  Patient was recently hospitalized for symptomatic anemia.  He was set up for upper endoscopy and colonoscopy, however, he left AMA.  He was then seen in the office and bidirectional endoscopy was arranged for today.  Hemoglobin at his emergency department visit was 6.1.  He received 2 units of PRBCs with adequate improvement to 8.7.  BUN 34 creatinine 1.7 at that time.  He denies any upper GI symptoms.  He reported daily bowel movements without melena hematochezia diarrhea or constipation.  There is a question if he did pass blood with the stool at one time.  Per the ED report he had maroon stool on DRE. The patient has been on Xarelto which been held for this procedure (last dose Monday 04/10/23) Most recent hemoglobin 8.8 MCV 79 platelets 295,000 creatinine 2.0 BUN 33 ferritin 5 iron saturation 3% TIBC elevated at 454 folate 26 Colonoscopy last performed 2007 demonstrating diverticulosis in the sigmoid.  He may have had 1 adenomatous polyp in the past (1994).  Also underwent colonoscopies in 2001   Past Medical History:  Diagnosis Date   Arthritis    Toes   Coronary artery disease    History of hiatal hernia    Hyperlipidemia    Hypertension    Kidney disorder    "right kidney doesn't work"   Presence of stent in artery    "4 in legs, 1 in neck"   Vascular abnormality    Wears dentures    full upper and lower    Past Surgical History:  Procedure Laterality Date   AMPUTATION TOE Right 09/16/2021   Procedure: AMPUTATION TOE;  Surgeon: Rosetta Posner, DPM;  Location: [facility];  Service: Podiatry;  Laterality: Right;   BACK SURGERY     CAROTID STENT Right     CATARACT EXTRACTION W/PHACO Left 06/17/2019   Procedure: CATARACT EXTRACTION PHACO AND INTRAOCULAR LENS PLACEMENT (IOC) LEFT;  Surgeon: Nevada Crane, MD;  Location: [facility];  Service: Ophthalmology;  Laterality: Left;   CATARACT EXTRACTION W/PHACO Right 07/08/2019   Procedure: CATARACT EXTRACTION PHACO AND INTRAOCULAR LENS PLACEMENT (IOC) right  00:39.2  10.8%  5.02  ;  Surgeon: Nevada Crane, MD;  Location: [facility];  Service: Ophthalmology;  Laterality: Right;   EYE SURGERY     LEG SURGERY Bilateral    stent placement   LOWER EXTREMITY ANGIOGRAPHY Right 08/19/2021   Procedure: LOWER EXTREMITY ANGIOGRAPHY;  Surgeon: Annice Needy, MD;  Location: [facility];  Service: Cardiovascular;  Laterality: Right;   PERIPHERAL VASCULAR BALLOON ANGIOPLASTY Right 09/21/2017   Procedure: PERIPHERAL VASCULAR BALLOON ANGIOPLASTY;  Surgeon: Annice Needy, MD;  Location: [facility];  Service: Cardiovascular;  Laterality: Right;   REVERSE SHOULDER ARTHROPLASTY Right 11/26/2019   Procedure: REVERSE SHOULDER ARTHROPLASTY;  Surgeon: Christena Flake, MD;  Location: [facility];  Service: Orthopedics;  Laterality: Right;    Family History No h/o GI disease or malignancy  Review of Systems  Constitutional:  Negative for activity change, appetite change, chills, diaphoresis, fatigue, fever and unexpected weight change.  HENT:  Negative for trouble swallowing and voice change.   Respiratory:  Negative  for shortness of breath and wheezing.   Cardiovascular:  Negative for chest pain, palpitations and leg swelling.  Gastrointestinal:  Positive for blood in stool. Negative for abdominal distention, abdominal pain, anal bleeding, constipation, diarrhea, nausea and vomiting.  Musculoskeletal:  Negative for arthralgias and myalgias.  Skin:  Negative for color change and pallor.  Neurological:  Negative for dizziness, syncope, weakness and numbness.  Psychiatric/Behavioral:   Negative for confusion. The patient is not nervous/anxious.   All other systems reviewed and are negative.    Medications No current facility-administered medications on file prior to encounter.   Current Outpatient Medications on File Prior to Encounter  Medication Sig Dispense Refill   acetaminophen (TYLENOL) 500 MG tablet Take 1,000 mg by mouth at bedtime.     ALPRAZolam (XANAX) 0.25 MG tablet Take 0.25 mg by mouth at bedtime.     aspirin EC 81 MG tablet Take 81 mg daily by mouth.      Cholecalciferol 25 MCG (1000 UT) capsule Take 2,000 Units by mouth daily.     diphenhydrAMINE (BENADRYL) 25 mg capsule Take 25 mg at bedtime by mouth.      hydrochlorothiazide (HYDRODIURIL) 25 MG tablet Take 1 tablet (25 mg total) by mouth daily. 30 tablet 0   Multiple Vitamins-Minerals (PRESERVISION AREDS 2 PO) Take 1 tablet by mouth 2 (two) times daily.     potassium chloride (KLOR-CON) 10 MEQ tablet Take 1 tablet by mouth daily.     pravastatin (PRAVACHOL) 20 MG tablet Take 20 mg by mouth at bedtime.     gabapentin (NEURONTIN) 300 MG capsule Take 300 mg by mouth 2 (two) times daily.     omeprazole (PRILOSEC) 20 MG capsule Take 20 mg by mouth at bedtime. (Patient not taking: Reported on 04/13/2023)     tamsulosin (FLOMAX) 0.4 MG CAPS capsule Take 0.4 mg by mouth daily.  (Patient not taking: Reported on 04/13/2023)  3   XARELTO 20 MG TABS tablet TAKE 1 TABLET BY MOUTH EVERY DAY *STOP ELIQUIS* (Patient taking differently: Take 20 mg by mouth daily.) 30 tablet 8    Pertinent medications related to GI and procedure were reviewed by me with the patient prior to the procedure   Current Facility-Administered Medications:    0.9 %  sodium chloride infusion, , Intravenous, Continuous, Jaynie Collins, DO, Last Rate: 20 mL/hr at 04/13/23 1212, 1,000 mL at 04/13/23 1212  sodium chloride 1,000 mL (04/13/23 1212)       Allergies  Allergen Reactions   Codeine Itching   Allergies were reviewed by me prior  to the procedure  Objective   Body mass index is 22.51 kg/m. Vitals:   04/13/23 1207  BP: (!) 174/55  Pulse: 73  Resp: 18  Temp: (!) 96.8 F (36 C)  TempSrc: Temporal  SpO2: 100%  Weight: 63.3 kg  Height: 5\' 6"  (1.676 m)     Physical Exam Vitals and nursing note reviewed.  Constitutional:      General: He is not in acute distress.    Appearance: Normal appearance. He is not ill-appearing, toxic-appearing or diaphoretic.     Comments: Frail-appearing  HENT:     Head: Normocephalic and atraumatic.     Nose: Nose normal.     Mouth/Throat:     Mouth: Mucous membranes are moist.     Pharynx: Oropharynx is clear.  Eyes:     General: No scleral icterus.    Extraocular Movements: Extraocular movements intact.  Cardiovascular:     Rate  and Rhythm: Normal rate and regular rhythm.     Heart sounds: Normal heart sounds. No murmur heard.    No friction rub. No gallop.  Pulmonary:     Effort: Pulmonary effort is normal. No respiratory distress.     Breath sounds: Normal breath sounds. No wheezing, rhonchi or rales.  Abdominal:     General: Abdomen is flat. Bowel sounds are normal. There is no distension.     Palpations: Abdomen is soft.     Tenderness: There is no abdominal tenderness. There is no guarding or rebound.  Musculoskeletal:     Cervical back: Neck supple.     Right lower leg: No edema.     Left lower leg: No edema.  Skin:    General: Skin is warm and dry.     Coloration: Skin is not jaundiced or pale.  Neurological:     General: No focal deficit present.     Mental Status: He is alert and oriented to person, place, and time. Mental status is at baseline.  Psychiatric:        Mood and Affect: Mood normal.        Behavior: Behavior normal.        Thought Content: Thought content normal.        Judgment: Judgment normal.      Assessment:  [name] is a 87 y.o. male  who presents today for Colonoscopy for Iron deficiency anemia .  Plan:   Colonoscopy with possible intervention today  Colonoscopy with possible biopsy, control of bleeding, polypectomy, and interventions as necessary has been discussed with the patient/patient representative. Informed consent was obtained from the patient/patient representative after explaining the indication, nature, and risks of the procedure including but not limited to death, bleeding, perforation, missed neoplasm/lesions, cardiorespiratory compromise, and reaction to medications. Opportunity for questions was given and appropriate answers were provided. Patient/patient representative has verbalized understanding is amenable to undergoing the procedure.   Jaynie Collins, DO  [facility]  Portions of the record may have been created with voice recognition software. Occasional wrong-word or 'sound-a-like' substitutions may have occurred due to the inherent limitations of voice recognition software.  Read the chart carefully and recognize, using context, where substitutions may have occurred.

## 2023-04-13 ENCOUNTER — Encounter: Payer: Self-pay | Admitting: Gastroenterology

## 2023-04-13 ENCOUNTER — Encounter
Admission: RE | Disposition: A | Discharge status: Home / Self Care | Payer: Self-pay | Source: Home / Self Care | Attending: Gastroenterology

## 2023-04-13 ENCOUNTER — Ambulatory Visit: Payer: Medicare Other | Admitting: General Practice

## 2023-04-13 ENCOUNTER — Ambulatory Visit
Admission: RE | Admit: 2023-04-13 | Discharge: 2023-04-13 | Disposition: A | Discharge status: Home / Self Care | Payer: Medicare Other | Attending: Gastroenterology | Admitting: Gastroenterology

## 2023-04-13 DIAGNOSIS — I739 Peripheral vascular disease, unspecified: Secondary | ICD-10-CM | POA: Diagnosis not present

## 2023-04-13 DIAGNOSIS — Z7901 Long term (current) use of anticoagulants: Secondary | ICD-10-CM | POA: Insufficient documentation

## 2023-04-13 DIAGNOSIS — K5730 Diverticulosis of large intestine without perforation or abscess without bleeding: Secondary | ICD-10-CM | POA: Diagnosis not present

## 2023-04-13 DIAGNOSIS — N189 Chronic kidney disease, unspecified: Secondary | ICD-10-CM | POA: Diagnosis not present

## 2023-04-13 DIAGNOSIS — I10 Essential (primary) hypertension: Secondary | ICD-10-CM | POA: Insufficient documentation

## 2023-04-13 DIAGNOSIS — I129 Hypertensive chronic kidney disease with stage 1 through stage 4 chronic kidney disease, or unspecified chronic kidney disease: Secondary | ICD-10-CM | POA: Diagnosis not present

## 2023-04-13 DIAGNOSIS — K2270 Barrett's esophagus without dysplasia: Secondary | ICD-10-CM | POA: Insufficient documentation

## 2023-04-13 DIAGNOSIS — K644 Residual hemorrhoidal skin tags: Secondary | ICD-10-CM | POA: Diagnosis not present

## 2023-04-13 DIAGNOSIS — Z87891 Personal history of nicotine dependence: Secondary | ICD-10-CM | POA: Insufficient documentation

## 2023-04-13 DIAGNOSIS — Z95820 Peripheral vascular angioplasty status with implants and grafts: Secondary | ICD-10-CM | POA: Insufficient documentation

## 2023-04-13 DIAGNOSIS — D122 Benign neoplasm of ascending colon: Secondary | ICD-10-CM | POA: Diagnosis not present

## 2023-04-13 DIAGNOSIS — K2289 Other specified disease of esophagus: Secondary | ICD-10-CM | POA: Insufficient documentation

## 2023-04-13 DIAGNOSIS — K2950 Unspecified chronic gastritis without bleeding: Secondary | ICD-10-CM | POA: Insufficient documentation

## 2023-04-13 DIAGNOSIS — I2510 Atherosclerotic heart disease of native coronary artery without angina pectoris: Secondary | ICD-10-CM | POA: Insufficient documentation

## 2023-04-13 DIAGNOSIS — D509 Iron deficiency anemia, unspecified: Secondary | ICD-10-CM | POA: Insufficient documentation

## 2023-04-13 HISTORY — PX: COLONOSCOPY WITH PROPOFOL: SHX5780

## 2023-04-13 HISTORY — PX: ESOPHAGOGASTRODUODENOSCOPY (EGD) WITH PROPOFOL: SHX5813

## 2023-04-13 SURGERY — COLONOSCOPY WITH PROPOFOL
Anesthesia: General

## 2023-04-13 MED ORDER — PHENYLEPHRINE 80 MCG/ML (10ML) SYRINGE FOR IV PUSH (FOR BLOOD PRESSURE SUPPORT)
PREFILLED_SYRINGE | INTRAVENOUS | Status: AC
  Filled 2023-04-13: qty 10

## 2023-04-13 MED ORDER — LIDOCAINE HCL (CARDIAC) PF 100 MG/5ML IV SOSY
PREFILLED_SYRINGE | INTRAVENOUS | Status: DC | PRN
  Administered 2023-04-13: 100 mg via INTRAVENOUS

## 2023-04-13 MED ORDER — PHENYLEPHRINE HCL (PRESSORS) 10 MG/ML IV SOLN
INTRAVENOUS | Status: DC | PRN
  Administered 2023-04-13 (×4): 80 ug via INTRAVENOUS

## 2023-04-13 MED ORDER — SODIUM CHLORIDE 0.9 % IV SOLN
INTRAVENOUS | Status: DC
  Administered 2023-04-13: 1000 mL via INTRAVENOUS

## 2023-04-13 MED ORDER — PROPOFOL 10 MG/ML IV BOLUS
INTRAVENOUS | Status: DC | PRN
  Administered 2023-04-13: 160 ug/kg/min via INTRAVENOUS
  Administered 2023-04-13: 90 mg via INTRAVENOUS

## 2023-04-13 NOTE — Anesthesia Postprocedure Evaluation (Signed)
Anesthesia Post Note  [name]  Procedure(s) Performed: COLONOSCOPY WITH PROPOFOL ESOPHAGOGASTRODUODENOSCOPY (EGD) WITH PROPOFOL  Patient location during evaluation: Endoscopy Anesthesia Type: General Level of consciousness: awake and alert Pain management: pain level controlled Vital Signs Assessment: post-procedure vital signs reviewed and stable Respiratory status: spontaneous breathing, nonlabored ventilation, respiratory function stable and patient connected to nasal cannula oxygen Cardiovascular status: blood pressure returned to baseline and stable Postop Assessment: no apparent nausea or vomiting Anesthetic complications: no  No notable events documented.   Last Vitals:  Vitals:   04/13/23 1414 04/13/23 1415  BP: 117/71 117/71  Pulse: 67 69  Resp: 17 17  Temp:    SpO2: 99% 100%    Last Pain:  Vitals:   04/13/23 1344  TempSrc: Temporal  PainSc: Asleep                 Stephanie Coup

## 2023-04-13 NOTE — Anesthesia Preprocedure Evaluation (Signed)
Anesthesia Evaluation  Patient identified by MRN, date of birth, ID band Patient awake    Reviewed: Allergy & Precautions, NPO status   Airway Mallampati: II  TM Distance: >3 FB     Dental   Pulmonary former smoker   Pulmonary exam normal        Cardiovascular hypertension, + CAD and + Peripheral Vascular Disease (s/p right external iliac artery stent in past and balloon angioplasty 08/19/21 )   Rhythm:Regular Rate:Normal     Neuro/Psych    GI/Hepatic hiatal hernia,,,  Endo/Other    Renal/GU CRFRenal disease     Musculoskeletal  (+) Arthritis ,    Abdominal   Peds  Hematology   Anesthesia Other Findings   Reproductive/Obstetrics                             Anesthesia Physical Anesthesia Plan  ASA: 3  Anesthesia Plan: General   Post-op Pain Management:    Induction: Intravenous  PONV Risk Score and Plan: 3 and Propofol infusion, TIVA, Treatment may vary due to age or medical condition and Ondansetron  Airway Management Planned: Natural Airway and Nasal Cannula  Additional Equipment:   Intra-op Plan:   Post-operative Plan:   Informed Consent: I have reviewed the patients History and Physical, chart, labs and discussed the procedure including the risks, benefits and alternatives for the proposed anesthesia with the patient or authorized representative who has indicated his/her understanding and acceptance.     Dental Advisory Given  Plan Discussed with: CRNA  Anesthesia Plan Comments: (Patient consented for risks of anesthesia including but not limited to:  - adverse reactions to medications - risk of airway placement if required - damage to eyes, teeth, lips or other oral mucosa - nerve damage due to positioning  - sore throat or hoarseness - Damage to heart, brain, nerves, lungs, other parts of body or loss of life  Patient voiced understanding.)         Anesthesia Quick Evaluation

## 2023-04-13 NOTE — Op Note (Signed)
[facility] Gastroenterology Patient Name: Chayanne Steg Procedure Date: 04/13/2023 12:38 PM MRN: [id number] Account #: [id number] Date of Birth: 01-13-1936 Admit Type: Outpatient Age: 87 Room: [facility] ENDO ROOM 1 Gender: Male Note Status: Finalized Instrument Name: Upper Endoscope [id number] Procedure:             Upper GI endoscopy Indications:           Iron deficiency anemia Providers:             Trenda Moots, DO Referring MD:          Jaynie Collins DO, DO (Referring MD) Medicines:             Monitored Anesthesia Care Complications:         No immediate complications. Estimated blood loss:                         Minimal. Procedure:             Pre-Anesthesia Assessment:                        - Prior to the procedure, a History and Physical was                         performed, and patient medications and allergies were                         reviewed. The patient is competent. The risks and                         benefits of the procedure and the sedation options and                         risks were discussed with the patient. All questions                         were answered and informed consent was obtained.                         Patient identification and proposed procedure were                         verified by the physician, the nurse, the anesthetist                         and the technician in the endoscopy suite. Mental                         Status Examination: alert and oriented. Airway                         Examination: normal oropharyngeal airway and neck                         mobility. Respiratory Examination: clear to                         auscultation. CV Examination: RRR, no murmurs, no S3  or S4. Prophylactic Antibiotics: The patient does not                         require prophylactic antibiotics. Prior                         Anticoagulants: The patient has taken Xarelto                          (rivaroxaban), last dose was 3 days prior to                         procedure. ASA Grade Assessment: III - A patient with                         severe systemic disease. After reviewing the risks and                         benefits, the patient was deemed in satisfactory                         condition to undergo the procedure. The anesthesia                         plan was to use monitored anesthesia care (MAC).                         Immediately prior to administration of medications,                         the patient was re-assessed for adequacy to receive                         sedatives. The heart rate, respiratory rate, oxygen                         saturations, blood pressure, adequacy of pulmonary                         ventilation, and response to care were monitored                         throughout the procedure. The physical status of the                         patient was re-assessed after the procedure.                        After obtaining informed consent, the endoscope was                         passed under direct vision. Throughout the procedure,                         the patient's blood pressure, pulse, and oxygen                         saturations were monitored continuously. The Endoscope  was introduced through the mouth, and advanced to the                         second part of duodenum. The upper GI endoscopy was                         accomplished without difficulty. The patient tolerated                         the procedure well. Findings:      The duodenal bulb, first portion of the duodenum and second portion of       the duodenum were normal. Biopsies for histology were taken with a cold       forceps for evaluation of celiac disease. Estimated blood loss was       minimal.      Localized mild inflammation characterized by erosions and erythema was       found at the incisura. Biopsies were taken with a  cold forceps for       Helicobacter pylori testing. Biopsy at incisura site as well. Estimated       blood loss was minimal.      The exam of the stomach was otherwise normal.      Esophagogastric landmarks were identified: the gastroesophageal junction       was found at 38 cm from the incisors. Estimated blood loss: none.      The Z-line was irregular.      There were esophageal mucosal changes classified as Barrett's stage       C2-M2 per Prague criteria present in the lower third of the esophagus.       The maximum longitudinal extent of these mucosal changes was 2 cm in       length. Mucosa was biopsied with a cold forceps for histology. A total       of 2 specimen bottles were sent to pathology. at 38 and 36 cm. Estimated       blood loss was minimal.      The exam of the esophagus was otherwise normal. Impression:            - Normal duodenal bulb, first portion of the duodenum                         and second portion of the duodenum. Biopsied.                        - Gastritis. Biopsied.                        - Esophagogastric landmarks identified.                        - Z-line irregular.                        - Esophageal mucosal changes classified as Barrett's                         stage C2-M2 per Prague criteria. Biopsied. Recommendation:        - Patient has a contact number available for  emergencies. The signs and symptoms of potential                         delayed complications were discussed with the patient.                         Return to normal activities tomorrow. Written                         discharge instructions were provided to the patient.                        - Discharge patient to home.                        - Resume previous diet.                        - Continue present medications.                        - Await pathology results.                        - Return to GI clinic as previously scheduled.                         - proceed with colonoscopy                        - The findings and recommendations were discussed with                         the patient.                        - The findings and recommendations were discussed with                         the patient's family. Procedure Code(s):     --- Professional ---                        [phone], Esophagogastroduodenoscopy, flexible,                         transoral; with biopsy, single or multiple Diagnosis Code(s):     --- Professional ---                        K22.70, Barrett's esophagus without dysplasia                        K29.70, Gastritis, unspecified, without bleeding                        K22.89, Other specified disease of esophagus                        D50.9, Iron deficiency anemia, unspecified CPT copyright 2022 American Medical Association. All rights reserved. The codes documented in this report are preliminary and upon coder review may  be revised to meet current compliance requirements. Attending  Participation:      I personally performed the entire procedure. Elfredia Nevins, DO Jaynie Collins DO, DO 04/13/2023 1:11:06 PM This report has been signed electronically. Number of Addenda: 0 Note Initiated On: 04/13/2023 12:38 PM Estimated Blood Loss:  Estimated blood loss was minimal.      [facility]

## 2023-04-13 NOTE — Interval H&P Note (Signed)
History and Physical Interval Note: Preprocedure H&P from 04/13/23  was reviewed and there was no interval change after seeing and examining the patient.  Written consent was obtained from the patient after discussion of risks, benefits, and alternatives. Patient has consented to proceed with Esophagogastroduodenoscopy and Colonoscopy with possible intervention   04/13/2023 12:42 PM  [name]  has presented today for surgery, with the diagnosis of IDA.  The various methods of treatment have been discussed with the patient and family. After consideration of risks, benefits and other options for treatment, the patient has consented to  Procedure(s): COLONOSCOPY WITH PROPOFOL (N/A) ESOPHAGOGASTRODUODENOSCOPY (EGD) WITH PROPOFOL (N/A) as a surgical intervention.  [name] history has been reviewed, patient examined, no change in status, stable for surgery.  I have reviewed the patient's chart and labs.  Questions were answered to the patient's satisfaction.     Bryan Welch

## 2023-04-13 NOTE — Op Note (Signed)
[facility] Gastroenterology Patient Name: Bryan Welch Procedure Date: 04/13/2023 12:39 PM MRN: [id number] Account #: [id number] Date of Birth: 09-25-1936 Admit Type: Outpatient Age: 87 Room: [facility] ENDO ROOM 1 Gender: Male Note Status: Finalized Instrument Name: Peds Colonoscope [id number] Procedure:             Colonoscopy Indications:           Iron deficiency anemia Providers:             Trenda Moots, DO Referring MD:          Jaynie Collins DO, DO (Referring MD) Medicines:             Monitored Anesthesia Care Complications:         No immediate complications. Estimated blood loss:                         Minimal. Procedure:             Pre-Anesthesia Assessment:                        - Prior to the procedure, a History and Physical was                         performed, and patient medications and allergies were                         reviewed. The patient is competent. The risks and                         benefits of the procedure and the sedation options and                         risks were discussed with the patient. All questions                         were answered and informed consent was obtained.                         Patient identification and proposed procedure were                         verified by the physician, the nurse, the anesthetist                         and the technician in the endoscopy suite. Mental                         Status Examination: alert and oriented. Airway                         Examination: normal oropharyngeal airway and neck                         mobility. Respiratory Examination: clear to                         auscultation. CV Examination: RRR, no murmurs, no S3  or S4. Prophylactic Antibiotics: The patient does not                         require prophylactic antibiotics. Prior                         Anticoagulants: The patient has taken Xarelto                          (rivaroxaban), last dose was 3 days prior to                         procedure. ASA Grade Assessment: III - A patient with                         severe systemic disease. After reviewing the risks and                         benefits, the patient was deemed in satisfactory                         condition to undergo the procedure. The anesthesia                         plan was to use monitored anesthesia care (MAC).                         Immediately prior to administration of medications,                         the patient was re-assessed for adequacy to receive                         sedatives. The heart rate, respiratory rate, oxygen                         saturations, blood pressure, adequacy of pulmonary                         ventilation, and response to care were monitored                         throughout the procedure. The physical status of the                         patient was re-assessed after the procedure.                        After obtaining informed consent, the colonoscope was                         passed under direct vision. Throughout the procedure,                         the patient's blood pressure, pulse, and oxygen                         saturations were monitored continuously. The  Colonoscope was introduced through the anus and                         advanced to the the terminal ileum, with                         identification of the appendiceal orifice and IC                         valve. The colonoscopy was performed without                         difficulty. The patient tolerated the procedure well.                         The quality of the bowel preparation was evaluated                         using the BBPS [facility] Bowel Preparation Scale) with                         scores of: Right Colon = 3 (entire mucosa seen well                         with no residual staining, small fragments of stool or                          opaque liquid), Transverse Colon = 3 (entire mucosa                         seen well with no residual staining, small fragments                         of stool or opaque liquid) and Left Colon = 2 (minor                         amount of residual staining, small fragments of stool                         and/or opaque liquid, but mucosa seen well). The total                         BBPS score equals 8. The quality of the bowel                         preparation was excellent. The terminal ileum,                         ileocecal valve, appendiceal orifice, and rectum were                         photographed. Findings:      The perianal and digital rectal examinations were normal. Pertinent       negatives include normal sphincter tone.      The terminal ileum appeared normal. Brief visualization as had difficult       intubating the TI. Laterally spreading lesion/polyp was on  the distal       fold of the IC Valve. On brief view within TI, could not appreciate       spread into TI. Estimated blood loss: none.      A 14 mm polypoid lesion was found at the ileocecal valve. The lesion was       granular lateral spreading. No bleeding was present. Central depression       of area. Biopsies were taken with a cold forceps for histology.       Estimated blood loss was minimal.      Retroflexion in the right colon was performed.      A 1 to 2 mm polyp was found in the cecum. The polyp was sessile. The       polyp was removed with a jumbo cold forceps. Resection and retrieval       were complete. Estimated blood loss was minimal.      Skin tags were found on perianal exam.      An 11 to 12 mm polyp was found in the ascending colon. The polyp was       sessile. The polyp was removed with a cold snare. Resection and       retrieval were complete. taken in two snares. To prevent bleeding after       the polypectomy, two hemostatic clips were successfully placed (MR       conditional).  There was no bleeding at the end of the procedure.       Estimated blood loss was minimal.      A 2 to 3 mm polyp was found in the descending colon. The polyp was       sessile. The polyp was removed with a cold snare. Resection and       retrieval were complete. Estimated blood loss was minimal.      A localized area of mildly granular mucosa was found in the rectum.       Biopsies were taken with a cold forceps for histology. Estimated blood       loss was minimal.      The exam was otherwise without abnormality on direct and retroflexion       views.      Multiple small-mouthed diverticula were found in the left colon.       Estimated blood loss: none. Impression:            - The examined portion of the ileum was normal.                        - Rule out malignancy, polypoid lesion at the                         ileocecal valve. Biopsied.                        - One 1 to 2 mm polyp in the cecum, removed with a                         jumbo cold forceps. Resected and retrieved.                        - Perianal skin tags found on perianal exam.                        -  One 11 to 12 mm polyp in the ascending colon,                         removed with a cold snare. Resected and retrieved.                         Clips (MR conditional) were placed.                        - One 2 to 3 mm polyp in the descending colon, removed                         with a cold snare. Resected and retrieved.                        - Granular mucosa in the rectum. Biopsied.                        - The examination was otherwise normal on direct and                         retroflexion views.                        - Diverticulosis in the left colon. Recommendation:        - Patient has a contact number available for                         emergencies. The signs and symptoms of potential                         delayed complications were discussed with the patient.                         Return to  normal activities tomorrow. Written                         discharge instructions were provided to the patient.                        - Discharge patient to home.                        - Resume previous diet.                        - Continue present medications.                        - No ibuprofen, naproxen, or other non-steroidal                         anti-inflammatory drugs for 5 days after polyp removal.                        - Resume Xarelto (rivaroxaban) at prior dose in 2                         days. Refer to managing physician for  further                         adjustment of therapy.                        - Xarelto restart "Sunday.                        Arrange for Advanced Endoscopy evaluation for possible                         cecal lesion resection pending biopsy results.                        - Await pathology results.                        - Repeat colonoscopy at appointment to be scheduled                         for retreatment.                        - Return to GI office as previously scheduled.                        - The findings and recommendations were discussed with                         the patient's family.                        - The findings and recommendations were discussed with                         the patient. Procedure Code(s):     --- Professional ---                        45385, Colonoscopy, flexible; with removal of                         tumor(s), polyp(s), or other lesion(s) by snare                         technique                        45" 380, 59, Colonoscopy, flexible; with biopsy, single                         or multiple Diagnosis Code(s):     --- Professional ---                        D49.0, Neoplasm of unspecified behavior of digestive                         system                        D12.0, Benign neoplasm of cecum  D12.2, Benign neoplasm of ascending colon                        D12.4,  Benign neoplasm of descending colon                        K62.89, Other specified diseases of anus and rectum                        K64.4, Residual hemorrhoidal skin tags                        D50.9, Iron deficiency anemia, unspecified                        K57.30, Diverticulosis of large intestine without                         perforation or abscess without bleeding CPT copyright 2022 [employer]. All rights reserved. The codes documented in this report are preliminary and upon coder review may  be revised to meet current compliance requirements. Attending Participation:      I personally performed the entire procedure. Elfredia Nevins, DO Jaynie Collins DO, DO 04/13/2023 1:50:35 PM This report has been signed electronically. Number of Addenda: 0 Note Initiated On: 04/13/2023 12:39 PM Scope Withdrawal Time: 0 hours 21 minutes 35 seconds  Total Procedure Duration: 0 hours 26 minutes 28 seconds  Estimated Blood Loss:  Estimated blood loss was minimal.      [employer]

## 2023-04-13 NOTE — Transfer of Care (Signed)
Immediate Anesthesia Transfer of Care Note  [name]  Procedure(s) Performed: COLONOSCOPY WITH PROPOFOL ESOPHAGOGASTRODUODENOSCOPY (EGD) WITH PROPOFOL  Patient Location: PACU  Anesthesia Type:General  Level of Consciousness: drowsy  Airway & Oxygen Therapy: Patient Spontanous Breathing  Post-op Assessment: Report given to RN and Post -op Vital signs reviewed and stable  Post vital signs: Reviewed and stable  Last Vitals:  Vitals Value Taken Time  BP 127/53 04/13/23 1344  Temp 35.8 1344  Pulse 64 04/13/23 1344  Resp 15 04/13/23 1344  SpO2 100 % 04/13/23 1344    Last Pain:  Vitals:   04/13/23 1344  TempSrc: Temporal  PainSc: Asleep         Complications: No notable events documented.

## 2023-04-14 ENCOUNTER — Encounter: Payer: Self-pay | Admitting: Gastroenterology

## 2023-04-15 ENCOUNTER — Other Ambulatory Visit (INDEPENDENT_AMBULATORY_CARE_PROVIDER_SITE_OTHER): Payer: Self-pay | Admitting: Nurse Practitioner

## 2023-04-21 ENCOUNTER — Inpatient Hospital Stay: Payer: Medicare Other | Attending: Oncology | Admitting: Internal Medicine

## 2023-04-21 ENCOUNTER — Encounter: Payer: Self-pay | Admitting: Internal Medicine

## 2023-04-21 ENCOUNTER — Inpatient Hospital Stay: Payer: Medicare Other

## 2023-04-21 VITALS — BP 134/57 | HR 76 | Temp 98.6°F | Wt 146.9 lb

## 2023-04-21 DIAGNOSIS — D649 Anemia, unspecified: Secondary | ICD-10-CM

## 2023-04-21 DIAGNOSIS — R5383 Other fatigue: Secondary | ICD-10-CM | POA: Insufficient documentation

## 2023-04-21 DIAGNOSIS — E785 Hyperlipidemia, unspecified: Secondary | ICD-10-CM | POA: Diagnosis not present

## 2023-04-21 DIAGNOSIS — Z8042 Family history of malignant neoplasm of prostate: Secondary | ICD-10-CM | POA: Diagnosis not present

## 2023-04-21 DIAGNOSIS — Z79899 Other long term (current) drug therapy: Secondary | ICD-10-CM | POA: Insufficient documentation

## 2023-04-21 DIAGNOSIS — Z8051 Family history of malignant neoplasm of kidney: Secondary | ICD-10-CM | POA: Diagnosis not present

## 2023-04-21 DIAGNOSIS — R200 Anesthesia of skin: Secondary | ICD-10-CM | POA: Diagnosis not present

## 2023-04-21 DIAGNOSIS — I10 Essential (primary) hypertension: Secondary | ICD-10-CM | POA: Diagnosis not present

## 2023-04-21 DIAGNOSIS — Z7982 Long term (current) use of aspirin: Secondary | ICD-10-CM | POA: Insufficient documentation

## 2023-04-21 DIAGNOSIS — R531 Weakness: Secondary | ICD-10-CM | POA: Insufficient documentation

## 2023-04-21 DIAGNOSIS — I700 Atherosclerosis of aorta: Secondary | ICD-10-CM | POA: Diagnosis not present

## 2023-04-21 DIAGNOSIS — N183 Chronic kidney disease, stage 3 unspecified: Secondary | ICD-10-CM | POA: Insufficient documentation

## 2023-04-21 DIAGNOSIS — E538 Deficiency of other specified B group vitamins: Secondary | ICD-10-CM | POA: Diagnosis not present

## 2023-04-21 DIAGNOSIS — I2510 Atherosclerotic heart disease of native coronary artery without angina pectoris: Secondary | ICD-10-CM | POA: Diagnosis not present

## 2023-04-21 DIAGNOSIS — K2270 Barrett's esophagus without dysplasia: Secondary | ICD-10-CM | POA: Diagnosis not present

## 2023-04-21 DIAGNOSIS — I129 Hypertensive chronic kidney disease with stage 1 through stage 4 chronic kidney disease, or unspecified chronic kidney disease: Secondary | ICD-10-CM | POA: Insufficient documentation

## 2023-04-21 DIAGNOSIS — Z7901 Long term (current) use of anticoagulants: Secondary | ICD-10-CM | POA: Diagnosis not present

## 2023-04-21 DIAGNOSIS — D509 Iron deficiency anemia, unspecified: Secondary | ICD-10-CM

## 2023-04-21 DIAGNOSIS — Z87891 Personal history of nicotine dependence: Secondary | ICD-10-CM | POA: Insufficient documentation

## 2023-04-21 NOTE — Progress Notes (Signed)
[facility]  [phone] [phone]) [phone]  ID: Bryan Welch OB: November 14, 1935  MR#: [id number]  AOZ#:[id number]  Patient Care Team: Gracelyn Nurse, MD as PCP - General (Internal Medicine)  REFERRING PROVIDER: Dr. Clydene Pugh  REASON FOR REFERRAL: Iron deficiency anemia  HPI: Bryan Welch is a 87 y.o. male with past medical history of CAD, hypertension, hyperlipidemia, PAD status post stents and right toe amputation, CKD stage III was referred to hematology for management of iron deficiency anemia.  Patient presented to ER on 03/22/2023 for generalized weakness and fatigue.  He noticed some blood in the stool about a week and a half ago.  Labs showed hemoglobin of 6.1, iron 13, saturation 3%, ferritin 5, folate 26, vitamin B12 182.  He received 1 unit of PRBC.  Repeat CBC from 03/28/2023 showed hemoglobin 9.2.   Was evaluated by Dr. Timothy Lasso of GI.  Colonoscopy done on 04/13/2023 showed 14 mm polypoid lesion at the ileocecal valve.  Had difficulty intubating TI.  One 1 to 2 mm polyp in the cecum.  111 to 12 mm polyp in ascending colon.  One 2 to 3 mm polyp in the descending colon.  Pathology showed tubular adenoma. Upper endoscopy showed gastritis and esophageal mucosal changes classified as Barrett's.  Pathology showed mild gastritis and Barrett's esophagus.  Patient reports some tingling and numbness in the feet for past 3 weeks.  He uses Tylenol every night which helps with sleep.  He had a trial of iron pills could not tolerate due to diarrhea.   REVIEW OF SYSTEMS:   ROS  As per HPI. Otherwise, a complete review of systems is negative.  PAST MEDICAL HISTORY: Past Medical History:  Diagnosis Date   Arthritis    Toes   Coronary artery disease    History of hiatal hernia    Hyperlipidemia    Hypertension    Kidney disorder    "right kidney doesn't work"   Presence of stent in artery    "4 in legs, 1 in neck"   Vascular abnormality    Wears  dentures    full upper and lower    PAST SURGICAL HISTORY: Past Surgical History:  Procedure Laterality Date   AMPUTATION TOE Right 09/16/2021   Procedure: AMPUTATION TOE;  Surgeon: Rosetta Posner, DPM;  Location: [facility];  Service: Podiatry;  Laterality: Right;   BACK SURGERY     CAROTID STENT Right    CATARACT EXTRACTION W/PHACO Left 06/17/2019   Procedure: CATARACT EXTRACTION PHACO AND INTRAOCULAR LENS PLACEMENT (IOC) LEFT;  Surgeon: Nevada Crane, MD;  Location: [facility];  Service: Ophthalmology;  Laterality: Left;   CATARACT EXTRACTION W/PHACO Right 07/08/2019   Procedure: CATARACT EXTRACTION PHACO AND INTRAOCULAR LENS PLACEMENT (IOC) right  00:39.2  10.8%  5.02  ;  Surgeon: Nevada Crane, MD;  Location: [facility];  Service: Ophthalmology;  Laterality: Right;   COLONOSCOPY WITH PROPOFOL N/A 04/13/2023   Procedure: COLONOSCOPY WITH PROPOFOL;  Surgeon: Jaynie Collins, DO;  Location: [facility];  Service: Gastroenterology;  Laterality: N/A;   ESOPHAGOGASTRODUODENOSCOPY (EGD) WITH PROPOFOL N/A 04/13/2023   Procedure: ESOPHAGOGASTRODUODENOSCOPY (EGD) WITH PROPOFOL;  Surgeon: Jaynie Collins, DO;  Location: [facility] ENDOSCOPY;  Service: Gastroenterology;  Laterality: N/A;   EYE SURGERY     LEG SURGERY Bilateral    stent placement   LOWER EXTREMITY ANGIOGRAPHY Right 08/19/2021   Procedure: LOWER EXTREMITY ANGIOGRAPHY;  Surgeon: Annice Needy, MD;  Location: [facility] INVASIVE CV LAB;  Service: Cardiovascular;  Laterality: Right;   PERIPHERAL VASCULAR BALLOON ANGIOPLASTY Right 09/21/2017   Procedure: PERIPHERAL VASCULAR BALLOON ANGIOPLASTY;  Surgeon: Annice Needy, MD;  Location: [facility] INVASIVE CV LAB;  Service: Cardiovascular;  Laterality: Right;   REVERSE SHOULDER ARTHROPLASTY Right 11/26/2019   Procedure: REVERSE SHOULDER ARTHROPLASTY;  Surgeon: Christena Flake, MD;  Location: [facility] ORS;  Service: Orthopedics;  Laterality: Right;    FAMILY  HISTORY: Family History  Problem Relation Age of Onset   Bladder Cancer Mother    Prostate cancer Father    Prostate cancer Brother    Diabetes Maternal Grandfather     HEALTH MAINTENANCE: Social History   Tobacco Use   Smoking status: Former    Types: Cigarettes    Quit date: 2001    Years since quitting: 23.4   Smokeless tobacco: Never  Vaping Use   Vaping Use: Never used  Substance Use Topics   Alcohol use: Not Currently   Drug use: No     Allergies  Allergen Reactions   Codeine Itching    Current Outpatient Medications  Medication Sig Dispense Refill   acetaminophen (TYLENOL) 500 MG tablet Take 1,000 mg by mouth at bedtime.     ALPRAZolam (XANAX) 0.25 MG tablet Take 0.25 mg by mouth at bedtime.     aspirin EC 81 MG tablet Take 81 mg daily by mouth.      Cholecalciferol 25 MCG (1000 UT) capsule Take 2,000 Units by mouth daily.     diphenhydrAMINE (BENADRYL) 25 mg capsule Take 25 mg at bedtime by mouth.      hydrochlorothiazide (HYDRODIURIL) 25 MG tablet Take 1 tablet (25 mg total) by mouth daily. 30 tablet 0   Multiple Vitamins-Minerals (PRESERVISION AREDS 2 PO) Take 1 tablet by mouth 2 (two) times daily.     potassium chloride (KLOR-CON) 10 MEQ tablet Take 1 tablet by mouth daily.     pravastatin (PRAVACHOL) 20 MG tablet Take 20 mg by mouth at bedtime.     XARELTO 20 MG TABS tablet TAKE 1 TABLET BY MOUTH EVERY DAY *STOP ELIQUIS* 30 tablet 8   gabapentin (NEURONTIN) 300 MG capsule Take 300 mg by mouth 2 (two) times daily.     omeprazole (PRILOSEC) 20 MG capsule Take 20 mg by mouth at bedtime. (Patient not taking: Reported on 04/13/2023)     tamsulosin (FLOMAX) 0.4 MG CAPS capsule Take 0.4 mg by mouth daily.  (Patient not taking: Reported on 04/13/2023)  3   No current facility-administered medications for this visit.    OBJECTIVE: Vitals:   04/21/23 0935  BP: (!) 134/57  Pulse: 76  Temp: 98.6 F (37 C)  SpO2: 100%     Body mass index is 23.71 kg/m.       General: Well-developed, well-nourished, no acute distress. Eyes: Pink conjunctiva, anicteric sclera. HEENT: Normocephalic, moist mucous membranes, clear oropharnyx. Lungs: Clear to auscultation bilaterally. Heart: Regular rate and rhythm. No rubs, murmurs, or gallops. Abdomen: Soft, nontender, nondistended. No organomegaly noted, normoactive bowel sounds. Musculoskeletal: No edema, cyanosis, or clubbing. Neuro: Alert, answering all questions appropriately. Cranial nerves grossly intact. Skin: No rashes or petechiae noted. Psych: Normal affect. Lymphatics: No cervical, calvicular, axillary or inguinal LAD.   LAB RESULTS:  Lab Results  Component Value Date   NA 138 03/23/2023   K 3.4 (L) 03/23/2023   CL 105 03/23/2023   CO2 24 03/23/2023   GLUCOSE 104 (H) 03/23/2023   BUN 35 (H) 03/23/2023   CREATININE 1.88 (H) 03/23/2023  CALCIUM 8.0 (L) 03/23/2023   PROT 7.0 03/22/2023   ALBUMIN 4.1 03/22/2023   AST 26 03/22/2023   ALT 13 03/22/2023   ALKPHOS 50 03/22/2023   BILITOT 0.6 03/22/2023   GFRNONAA 35 (L) 03/23/2023   GFRAA 43 (L) 11/21/2019    Lab Results  Component Value Date   WBC 7.6 03/23/2023   NEUTROABS 8.9 (H) 08/24/2021   HGB 8.7 (L) 03/23/2023   HCT 27.8 (L) 03/23/2023   MCV 82.7 03/23/2023   PLT 217 03/23/2023    Lab Results  Component Value Date   TIBC 454 (H) 03/22/2023   FERRITIN 5 (L) 03/22/2023   IRONPCTSAT 3 (L) 03/22/2023     STUDIES: CT Angio Chest/Abd/Pel for Dissection W and/or Wo Contrast  Result Date: 03/22/2023 CLINICAL DATA:  Back pain anemia EXAM: CT ANGIOGRAPHY CHEST, ABDOMEN AND PELVIS TECHNIQUE: Non-contrast CT of the chest was initially obtained. Multidetector CT imaging through the chest, abdomen and pelvis was performed using the standard protocol during bolus administration of intravenous contrast. Multiplanar reconstructed images and MIPs were obtained and reviewed to evaluate the vascular anatomy. RADIATION DOSE REDUCTION: This  exam was performed according to the departmental dose-optimization program which includes automated exposure control, adjustment of the mA and/or kV according to patient size and/or use of iterative reconstruction technique. CONTRAST:  75mL OMNIPAQUE IOHEXOL 350 MG/ML SOLN COMPARISON:  CT 09/20/2017, 03/13/2013 FINDINGS: CTA CHEST FINDINGS Cardiovascular: Non contrasted images of the chest demonstrate no acute intramural hematoma. Heavily calcified thoracic aorta without aneurysm. Multiple areas of aortic ulceration at the distal arch and proximal descending thoracic aorta. Probable penetrating ulcer at the distal arch, series 5 image 49 and sagittal series 11, image 92. 1.7 by 0.9 cm penetrating aortic ulcer at the distal descending thoracic aorta, series 5, image 106, maximal luminal diameter at this level of 3.2 cm on axial images. Irregular pedunculated mural thrombus projecting into lumen of aorta on series 5, image 58. coronary vascular calcification. Normal cardiac size. No pericardial effusion Mediastinum/Nodes: Midline trachea. No thyroid mass. No suspicious lymph nodes. Esophagus within normal limits. Lungs/Pleura: Emphysema. Scarring at the apices. No consolidation, pleural effusion, or pneumothorax. Right upper lobe ground-glass nodule measuring 8 x 9 mm on series 6, image 49, adjacent to emphysematous disease. Musculoskeletal: Sternum is intact. Chronic appearing mild superior endplate deformity at T3 and T4. Review of the MIP images confirms the above findings. CTA ABDOMEN AND PELVIS FINDINGS VASCULAR Aorta: Moderate severe aortic atherosclerosis. Infrarenal abdominal aortic aneurysm measuring 4.3 x 4.1 cm on axial series 5, image 183, previously 3.8 x 3.6 cm. Web like defect slightly cranial to this without significant stenosis, coronal series 10 image 85. This finding was present on the previous exam. No acute occlusive disease. No dissection. Celiac: Calcification at the origin. No significant  stenosis. Focal ectasias of the distal trunk of the celiac measuring up to 9 mm, previously 10 mm. Moderate disease of its branch vessels. SMA: Mixed calcified and noncalcified plaque at the origin and proximal aspect of the superior mesenteric artery. Slightly greater than 50% luminal stenosis of the SMA, series 5, image 161 and series 9, image 185 secondary to noncalcified plaque. No occlusion. No aneurysm. Renals: Single right and single left renal arteries bilaterally. Mixed calcified and noncalcified plaque involving the bilateral renal arteries. Diffuse atrophy of the right renal artery. Suspect moderate diffuse stenosis of the right and left renal arteries. IMA: Occluded with reconstitution via SMA collaterals Inflow: Moderate severe calcified and noncalcified disease of the iliac  vessels without aneurysm or dissection. Right external iliac stent appears patent. Veins: No obvious venous abnormality within the limitations of this arterial phase study. Review of the MIP images confirms the above findings. NON-VASCULAR Hepatobiliary: Distended gallbladder with stones. No focal hepatic abnormality. No biliary dilatation Pancreas: Unremarkable. No pancreatic ductal dilatation or surrounding inflammatory changes. Spleen: Normal in size without focal abnormality. Adrenals/Urinary Tract: Adrenal glands are within normal limits. Atrophy of right kidney. Chronic moderate hydronephrosis of right kidney without hydroureter consistent with UPJ stenosis. Urinary bladder is unremarkable Stomach/Bowel: The stomach is nonenlarged. There is no dilated small bowel. Diverticular disease of the colon. Negative appendix. Lymphatic: No suspicious lymph nodes Reproductive: Mildly enlarged prostate with mass effect on the bladder Other: Negative for free air or free fluid. Musculoskeletal: No acute osseous abnormality. Review of the MIP images confirms the above findings. IMPRESSION: 1. Negative for acute dissection or  retroperitoneal hematoma. 2. Advanced atherosclerotic vascular disease of the aorta. Multiple areas of aortic ulceration at the distal arch and proximal descending thoracic aorta with evidence for pedunculated mural thrombus at the distal arch. 17 x 9 mm penetrating aortic ulcer involving the descending distal thoracic aorta with mild luminal enlargement up to 3.2 cm at this level. Vascular surgery consultation is recommended. 3. Infrarenal abdominal aortic aneurysm measuring up to 4.3 cm, previously 3.8 cm. Recommend follow-up every 12 months and vascular consultation. This recommendation follows ACR consensus guidelines: White Paper of the ACR Incidental Findings Committee II on Vascular Findings. J Am Coll Radiol 2013; 10:789-794. 4. Emphysema. 8 x 9 mm ground-glass nodule within the right upper lobe. Initial follow-up with CT at 6 months is recommended to confirm persistence. If persistent, repeat CT is recommended every 2 years until 5 years of stability has been established. This recommendation follows the consensus statement: Guidelines for Management of Incidental Pulmonary Nodules Detected on CT Images: From the Fleischner Society 2017; Radiology 2017; 284:228-243. 5. Distended gallbladder with stones. 6. Diverticular disease of the colon without acute inflammatory process. 7. 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 Electronically Signed   By: Jasmine Pang M.D.   On: 03/22/2023 17:51    ASSESSMENT AND PLAN:   Bryan Welch is a 87 y.o. male with pmh of CAD, hypertension, hyperlipidemia, PAD status post stents and right toe amputation, CKD stage III was referred to hematology for management of iron deficiency anemia.  # Iron deficiency anemia -Progressive. Patient presented to ER on 03/22/2023 for generalized weakness and fatigue.  He noticed some blood in the stool about a week and a half ago.  Labs showed  hemoglobin of 6.1, iron 13, saturation 3%, ferritin 5, folate 26, vitamin B12 182.  He received 1 unit of PRBC.  Repeat CBC from 03/28/2023 showed hemoglobin 9.2.   Was evaluated by Dr. Timothy Lasso of GI.  Colonoscopy done on 04/13/2023 showed 14 mm polypoid lesion at the ileocecal valve.  Had difficulty intubating TI.  One 1 to 2 mm polyp in the cecum.  111 to 12 mm polyp in ascending colon.  One 2 to 3 mm polyp in the descending colon.  Pathology showed tubular adenoma. Upper endoscopy showed gastritis and esophageal mucosal changes classified as Barrett's.  Pathology showed mild gastritis and Barrett's esophagus.  Patient reports he is referred [facility] GI at [facility] due to inability to intubate TI.  He is pending scheduling.  -Could not tolerate oral iron due to diarrhea.  I discussed about IV Venofer  200 mg weekly x 5 doses.  Occasional nausea and back pain was discussed.  Rarely can cause allergic reaction.  Patient is agreeable to proceed.  I will follow-up with him in 3 months to repeat labs.  # Vitamin B12 deficiency -B12 182.  Advised the patient to start B12 supplement 1000 mcg daily.  # PAD -Status post stents in bilateral lower extremity.  On Xarelto and aspirin  # CKD stage III -stable  Orders Placed This Encounter  Procedures   CBC with Differential/Platelet   Iron and TIBC   Ferritin   Vitamin B12     Patient expressed understanding and was in agreement with this plan. He also understands that He can call clinic at any time with any questions, concerns, or complaints.   I spent a total of 45 minutes reviewing chart data, face-to-face evaluation with the patient, counseling and coordination of care as detailed above.  Michaelyn Barter, MD   04/21/2023 12:14 PM , CKD stage IIIa

## 2023-04-21 NOTE — Patient Instructions (Signed)
Please start vitamin b12 1000 mcg daily. Available over the counter 

## 2023-04-27 MED FILL — Iron Sucrose Inj 20 MG/ML (Fe Equiv): INTRAVENOUS | Qty: 10 | Status: AC

## 2023-04-28 ENCOUNTER — Inpatient Hospital Stay: Payer: Medicare Other

## 2023-04-28 VITALS — BP 117/35 | HR 94 | Temp 97.9°F | Resp 18

## 2023-04-28 DIAGNOSIS — D509 Iron deficiency anemia, unspecified: Secondary | ICD-10-CM

## 2023-04-28 MED ORDER — SODIUM CHLORIDE 0.9 % IV SOLN
Freq: Once | INTRAVENOUS | Status: AC
  Filled 2023-04-28: qty 250

## 2023-04-28 MED ORDER — SODIUM CHLORIDE 0.9 % IV SOLN
200.0000 mg | Freq: Once | INTRAVENOUS | Status: AC
  Administered 2023-04-28: 200 mg via INTRAVENOUS
  Filled 2023-04-28: qty 200

## 2023-04-28 NOTE — Patient Instructions (Signed)

## 2023-05-03 ENCOUNTER — Encounter: Payer: Self-pay | Admitting: Internal Medicine

## 2023-05-05 ENCOUNTER — Inpatient Hospital Stay: Payer: Medicare Other

## 2023-05-05 VITALS — BP 120/48 | HR 83 | Temp 98.2°F | Resp 17

## 2023-05-05 DIAGNOSIS — D509 Iron deficiency anemia, unspecified: Secondary | ICD-10-CM

## 2023-05-05 MED ORDER — SODIUM CHLORIDE 0.9 % IV SOLN
200.0000 mg | Freq: Once | INTRAVENOUS | Status: AC
  Administered 2023-05-05: 200 mg via INTRAVENOUS
  Filled 2023-05-05: qty 200

## 2023-05-05 MED ORDER — SODIUM CHLORIDE 0.9 % IV SOLN
Freq: Once | INTRAVENOUS | Status: AC
  Filled 2023-05-05: qty 250

## 2023-05-05 NOTE — Patient Instructions (Signed)
[facility]  Discharge Instructions: Thank you for choosing [facility] to provide your oncology and hematology care.  If you have a lab appointment with the Cancer Center, please go directly to the Cancer Center and check in at the registration area.  Wear comfortable clothing and clothing appropriate for easy access to any Portacath or PICC line.   We strive to give you quality time with your provider. You may need to reschedule your appointment if you arrive late (15 or more minutes).  Arriving late affects you and other patients whose appointments are after yours.  Also, if you miss three or more appointments without notifying the office, you may be dismissed from the clinic at the provider's discretion.      For prescription refill requests, have your pharmacy contact our office and allow 72 hours for refills to be completed.    Today you received the following chemotherapy and/or immunotherapy agents Venofer.      To help prevent nausea and vomiting after your treatment, we encourage you to take your nausea medication as directed.  BELOW ARE SYMPTOMS THAT SHOULD BE REPORTED IMMEDIATELY: *FEVER GREATER THAN 100.4 F (38 C) OR HIGHER *CHILLS OR SWEATING *NAUSEA AND VOMITING THAT IS NOT CONTROLLED WITH YOUR NAUSEA MEDICATION *UNUSUAL SHORTNESS OF BREATH *UNUSUAL BRUISING OR BLEEDING *URINARY PROBLEMS (pain or burning when urinating, or frequent urination) *BOWEL PROBLEMS (unusual diarrhea, constipation, pain near the anus) TENDERNESS IN MOUTH AND THROAT WITH OR WITHOUT PRESENCE OF ULCERS (sore throat, sores in mouth, or a toothache) UNUSUAL RASH, SWELLING OR PAIN  UNUSUAL VAGINAL DISCHARGE OR ITCHING   Items with * indicate a potential emergency and should be followed up as soon as possible or go to the Emergency Department if any problems should occur.  Please show the CHEMOTHERAPY ALERT CARD or IMMUNOTHERAPY ALERT CARD at check-in to  the Emergency Department and triage nurse.  Should you have questions after your visit or need to cancel or reschedule your appointment, please contact [facility]  [phone] and follow the prompts.  Office hours are 8:00 a.m. to 4:30 p.m. Monday - Friday. Please note that voicemails left after 4:00 p.m. may not be returned until the following business day.  We are closed weekends and major holidays. You have access to a nurse at all times for urgent questions. Please call the main number to the clinic [phone] and follow the prompts.  For any non-urgent questions, you may also contact your provider using MyChart. We now offer e-Visits for anyone 18 and older to request care online for non-urgent symptoms. For details visit [email or web address].   Also download the MyChart app! Go to the app store, search "MyChart", open the app, select [facility], and log in with your MyChart username and password.   

## 2023-05-08 ENCOUNTER — Emergency Department: Payer: Medicare Other

## 2023-05-08 ENCOUNTER — Encounter: Payer: Self-pay | Admitting: Internal Medicine

## 2023-05-08 ENCOUNTER — Inpatient Hospital Stay
Admission: EM | Admit: 2023-05-08 | Discharge: 2023-05-12 | DRG: 811 | Disposition: A | Discharge status: Home Health | Payer: Medicare Other | Attending: Student | Admitting: Student

## 2023-05-08 ENCOUNTER — Other Ambulatory Visit: Payer: Self-pay

## 2023-05-08 DIAGNOSIS — K922 Gastrointestinal hemorrhage, unspecified: Secondary | ICD-10-CM

## 2023-05-08 DIAGNOSIS — Z833 Family history of diabetes mellitus: Secondary | ICD-10-CM

## 2023-05-08 DIAGNOSIS — Z96611 Presence of right artificial shoulder joint: Secondary | ICD-10-CM | POA: Diagnosis present

## 2023-05-08 DIAGNOSIS — R42 Dizziness and giddiness: Secondary | ICD-10-CM | POA: Diagnosis not present

## 2023-05-08 DIAGNOSIS — Z7982 Long term (current) use of aspirin: Secondary | ICD-10-CM

## 2023-05-08 DIAGNOSIS — I700 Atherosclerosis of aorta: Secondary | ICD-10-CM | POA: Diagnosis present

## 2023-05-08 DIAGNOSIS — E876 Hypokalemia: Secondary | ICD-10-CM | POA: Diagnosis present

## 2023-05-08 DIAGNOSIS — I10 Essential (primary) hypertension: Secondary | ICD-10-CM | POA: Diagnosis not present

## 2023-05-08 DIAGNOSIS — D649 Anemia, unspecified: Secondary | ICD-10-CM | POA: Diagnosis not present

## 2023-05-08 DIAGNOSIS — Z89421 Acquired absence of other right toe(s): Secondary | ICD-10-CM

## 2023-05-08 DIAGNOSIS — I129 Hypertensive chronic kidney disease with stage 1 through stage 4 chronic kidney disease, or unspecified chronic kidney disease: Secondary | ICD-10-CM | POA: Diagnosis present

## 2023-05-08 DIAGNOSIS — D509 Iron deficiency anemia, unspecified: Secondary | ICD-10-CM | POA: Diagnosis not present

## 2023-05-08 DIAGNOSIS — I739 Peripheral vascular disease, unspecified: Secondary | ICD-10-CM

## 2023-05-08 DIAGNOSIS — Z7901 Long term (current) use of anticoagulants: Secondary | ICD-10-CM

## 2023-05-08 DIAGNOSIS — I7143 Infrarenal abdominal aortic aneurysm, without rupture: Secondary | ICD-10-CM | POA: Diagnosis present

## 2023-05-08 DIAGNOSIS — K2970 Gastritis, unspecified, without bleeding: Secondary | ICD-10-CM | POA: Diagnosis present

## 2023-05-08 DIAGNOSIS — Z79899 Other long term (current) drug therapy: Secondary | ICD-10-CM

## 2023-05-08 DIAGNOSIS — Z87891 Personal history of nicotine dependence: Secondary | ICD-10-CM

## 2023-05-08 DIAGNOSIS — I2510 Atherosclerotic heart disease of native coronary artery without angina pectoris: Secondary | ICD-10-CM | POA: Diagnosis present

## 2023-05-08 DIAGNOSIS — Z885 Allergy status to narcotic agent status: Secondary | ICD-10-CM

## 2023-05-08 DIAGNOSIS — R933 Abnormal findings on diagnostic imaging of other parts of digestive tract: Secondary | ICD-10-CM

## 2023-05-08 DIAGNOSIS — K31811 Angiodysplasia of stomach and duodenum with bleeding: Secondary | ICD-10-CM | POA: Diagnosis present

## 2023-05-08 DIAGNOSIS — N1832 Chronic kidney disease, stage 3b: Secondary | ICD-10-CM

## 2023-05-08 DIAGNOSIS — E785 Hyperlipidemia, unspecified: Secondary | ICD-10-CM

## 2023-05-08 DIAGNOSIS — Z8052 Family history of malignant neoplasm of bladder: Secondary | ICD-10-CM

## 2023-05-08 DIAGNOSIS — Z8042 Family history of malignant neoplasm of prostate: Secondary | ICD-10-CM

## 2023-05-08 HISTORY — DX: Hypokalemia: E87.6

## 2023-05-08 LAB — COMPREHENSIVE METABOLIC PANEL
ALT: 9 U/L (ref 0–44)
AST: 18 U/L (ref 15–41)
Albumin: 3.9 g/dL (ref 3.5–5.0)
Alkaline Phosphatase: 54 U/L (ref 38–126)
Anion gap: 10 (ref 5–15)
BUN: 30 mg/dL — ABNORMAL HIGH (ref 8–23)
CO2: 21 mmol/L — ABNORMAL LOW (ref 22–32)
Calcium: 8.2 mg/dL — ABNORMAL LOW (ref 8.9–10.3)
Chloride: 107 mmol/L (ref 98–111)
Creatinine, Ser: 1.63 mg/dL — ABNORMAL HIGH (ref 0.61–1.24)
GFR, Estimated: 41 mL/min — ABNORMAL LOW (ref >60)
Glucose, Bld: 126 mg/dL — ABNORMAL HIGH (ref 70–99)
Potassium: 3.4 mmol/L — ABNORMAL LOW (ref 3.5–5.1)
Sodium: 138 mmol/L (ref 135–145)
Total Bilirubin: 0.5 mg/dL (ref 0.3–1.2)
Total Protein: 6.7 g/dL (ref 6.5–8.1)

## 2023-05-08 LAB — CBC
HCT: 20.8 % — ABNORMAL LOW (ref 39.0–52.0)
HCT: 23.4 % — ABNORMAL LOW (ref 39.0–52.0)
Hemoglobin: 5.9 g/dL — ABNORMAL LOW (ref 13.0–17.0)
Hemoglobin: 7 g/dL — ABNORMAL LOW (ref 13.0–17.0)
MCH: 24.5 pg — ABNORMAL LOW (ref 26.0–34.0)
MCH: 25.2 pg — ABNORMAL LOW (ref 26.0–34.0)
MCHC: 28.4 g/dL — ABNORMAL LOW (ref 30.0–36.0)
MCHC: 29.9 g/dL — ABNORMAL LOW (ref 30.0–36.0)
MCV: 86.3 fL (ref 80.0–100.0)
MCV: 84.2 fL (ref 80.0–100.0)
Platelets: 304 10*3/uL (ref 150–400)
Platelets: 292 10*3/uL (ref 150–400)
RBC: 2.41 MIL/uL — ABNORMAL LOW (ref 4.22–5.81)
RBC: 2.78 MIL/uL — ABNORMAL LOW (ref 4.22–5.81)
RDW: 23.5 % — ABNORMAL HIGH (ref 11.5–15.5)
RDW: 21.6 % — ABNORMAL HIGH (ref 11.5–15.5)
WBC: 5.9 10*3/uL (ref 4.0–10.5)
WBC: 7.2 10*3/uL (ref 4.0–10.5)
nRBC: 0 % (ref 0.0–0.2)
nRBC: 0.4 % — ABNORMAL HIGH (ref 0.0–0.2)

## 2023-05-08 LAB — MAGNESIUM: Magnesium: 1.8 mg/dL (ref 1.7–2.4)

## 2023-05-08 LAB — IRON AND TIBC
Iron: 22 ug/dL — ABNORMAL LOW (ref 45–182)
Saturation Ratios: 5 % — ABNORMAL LOW (ref 17.9–39.5)
TIBC: 414 ug/dL (ref 250–450)
UIBC: 392 ug/dL

## 2023-05-08 LAB — FOLATE: Folate: 12.1 ng/mL (ref >5.9)

## 2023-05-08 LAB — RETICULOCYTES
Immature Retic Fract: 40.1 % — ABNORMAL HIGH (ref 2.3–15.9)
RBC.: 2.43 MIL/uL — ABNORMAL LOW (ref 4.22–5.81)
Retic Count, Absolute: 137.3 10*3/uL (ref 19.0–186.0)
Retic Ct Pct: 5.7 % — ABNORMAL HIGH (ref 0.4–3.1)

## 2023-05-08 LAB — TYPE AND SCREEN

## 2023-05-08 LAB — PREPARE RBC (CROSSMATCH)

## 2023-05-08 LAB — PROTIME-INR
INR: 2.5 — ABNORMAL HIGH (ref 0.8–1.2)
Prothrombin Time: 27.1 seconds — ABNORMAL HIGH (ref 11.4–15.2)

## 2023-05-08 LAB — APTT: aPTT: 40 seconds — ABNORMAL HIGH (ref 24–36)

## 2023-05-08 LAB — FERRITIN: Ferritin: 54 ng/mL (ref 24–336)

## 2023-05-08 MED ORDER — SODIUM CHLORIDE 0.9 % IV SOLN: INTRAVENOUS | Status: DC

## 2023-05-08 MED ORDER — IOHEXOL 350 MG/ML SOLN
80.0000 mL | Freq: Once | INTRAVENOUS | Status: AC | PRN
  Administered 2023-05-08: 80 mL via INTRAVENOUS

## 2023-05-08 MED ORDER — PRAVASTATIN SODIUM 20 MG PO TABS
20.0000 mg | ORAL_TABLET | Freq: Every day | ORAL | Status: DC
  Administered 2023-05-08 – 2023-05-12 (×5): 20 mg via ORAL
  Filled 2023-05-08 (×5): qty 1

## 2023-05-08 MED ORDER — TAMSULOSIN HCL 0.4 MG PO CAPS
0.4000 mg | ORAL_CAPSULE | Freq: Every day | ORAL | Status: DC
  Administered 2023-05-09 – 2023-05-12 (×4): 0.4 mg via ORAL
  Filled 2023-05-08 (×4): qty 1

## 2023-05-08 MED ORDER — SODIUM CHLORIDE 0.9 % IV SOLN: 10.0000 mL/h | Freq: Once | INTRAVENOUS | Status: DC

## 2023-05-08 MED ORDER — ACETAMINOPHEN 325 MG PO TABS
650.0000 mg | ORAL_TABLET | Freq: Four times a day (QID) | ORAL | Status: DC | PRN
  Administered 2023-05-11: 650 mg via ORAL
  Filled 2023-05-08: qty 2

## 2023-05-08 MED ORDER — PANTOPRAZOLE SODIUM 40 MG IV SOLR
40.0000 mg | Freq: Two times a day (BID) | INTRAVENOUS | Status: DC
  Administered 2023-05-09 (×2): 40 mg via INTRAVENOUS
  Filled 2023-05-08 (×3): qty 10

## 2023-05-08 MED ORDER — DIPHENHYDRAMINE HCL 25 MG PO CAPS
25.0000 mg | ORAL_CAPSULE | Freq: Every day | ORAL | Status: DC
  Administered 2023-05-08 – 2023-05-11 (×4): 25 mg via ORAL
  Filled 2023-05-08 (×4): qty 1

## 2023-05-08 MED ORDER — POTASSIUM CHLORIDE CRYS ER 20 MEQ PO TBCR
40.0000 meq | EXTENDED_RELEASE_TABLET | Freq: Once | ORAL | Status: AC
  Administered 2023-05-08: 40 meq via ORAL
  Filled 2023-05-08: qty 2

## 2023-05-08 MED ORDER — PROSIGHT PO TABS
1.0000 | ORAL_TABLET | Freq: Two times a day (BID) | ORAL | Status: DC
  Administered 2023-05-08 – 2023-05-12 (×8): 1 via ORAL
  Filled 2023-05-08 (×8): qty 1

## 2023-05-08 MED ORDER — VITAMIN D 25 MCG (1000 UNIT) PO TABS
2000.0000 [IU] | ORAL_TABLET | Freq: Every day | ORAL | Status: DC
  Administered 2023-05-09 – 2023-05-12 (×4): 2000 [IU] via ORAL
  Filled 2023-05-08 (×4): qty 2

## 2023-05-08 MED ORDER — PANTOPRAZOLE SODIUM 40 MG IV SOLR
40.0000 mg | Freq: Once | INTRAVENOUS | Status: AC
  Administered 2023-05-08: 40 mg via INTRAVENOUS
  Filled 2023-05-08: qty 10

## 2023-05-08 MED ORDER — ONDANSETRON HCL 4 MG/2ML IJ SOLN: 4.0000 mg | Freq: Three times a day (TID) | INTRAMUSCULAR | Status: DC | PRN

## 2023-05-08 MED ORDER — GABAPENTIN 300 MG PO CAPS
300.0000 mg | ORAL_CAPSULE | Freq: Two times a day (BID) | ORAL | Status: DC
  Administered 2023-05-08 – 2023-05-12 (×8): 300 mg via ORAL
  Filled 2023-05-08 (×8): qty 1

## 2023-05-08 MED ORDER — ALPRAZOLAM 0.25 MG PO TABS
0.2500 mg | ORAL_TABLET | Freq: Every day | ORAL | Status: DC
  Administered 2023-05-08 – 2023-05-11 (×4): 0.25 mg via ORAL
  Filled 2023-05-08 (×4): qty 1

## 2023-05-08 MED ORDER — HYDRALAZINE HCL 20 MG/ML IJ SOLN: 5.0000 mg | INTRAMUSCULAR | Status: DC | PRN

## 2023-05-08 MED ORDER — SODIUM CHLORIDE 0.9 % IV BOLUS
500.0000 mL | Freq: Once | INTRAVENOUS | Status: AC
  Administered 2023-05-08: 500 mL via INTRAVENOUS

## 2023-05-08 MED FILL — Iron Sucrose Inj 20 MG/ML (Fe Equiv): INTRAVENOUS | Qty: 10 | Status: AC

## 2023-05-08 NOTE — H&P (Signed)
History and Physical    Bryan Welch:[id number] DOB: Jun 11, 1936 DOA: 05/08/2023  Referring MD/NP/PA:   PCP: Gracelyn Nurse, MD   Patient coming from:  The patient is coming from home.     Chief Complaint: weakness, dizziness, shortness of breath  HPI: Bryan Welch is a 87 y.o. male with medical history significant of AAA, ulcer of aorta, carotid artery stenosis, peripheral vascular disease on Xarelto, HTN, HLD, CAD, anixety, CKD-3b, iron deficiency anemia on IV iron, who presents with weakness, dizziness and shortness of breath.  Patient states that in the past several days, he has dizziness and weakness which has been progressively worsening.  No fall, no unilateral numbness or tinglings in extremities.  No facial droop or slurred speech.  Patient reported dark stool to ED physician, but denies dark stool to me.  No rectal bleeding.  He has mild shortness of breath, no chest pain.  No fever or chills.  Patient does not have nausea, vomiting, diarrhea or abdominal pain.  Denies chest pain, cough, fever or chills.  No symptoms of UTI. He took last dose of ASA and Xarelto this AM (7/1/)  Of note, patient has hx of iron deficiency anemia, could not tolerate oral iron supplement.  Per hematologist, Dr. Alan Ripper clinical note, patient was started on IV infusion of Venofer 200 mg weekly for 5 doses, started on 04/21/2023.  Patient has received 2 dose so far.  Tomorrow will be due for next dose per patient.  Data reviewed independently and ED Course: pt was found to have Hgb 8.8 on 5/16 --> 5.9 today, WBC 5.9, INR 2.5, stable renal function, potassium 3.4, temperature normal, blood pressure 126/95, heart rate 85, RR 20, oxygen saturation 100% on room air.  Patient is placed on head of bed for patient.  CT of abdomen/pelvis showed colonic diverticulosis, but no active GI bleeding.  Dr. Servando Snare of GI is consulted.   CTA of abd/pelvis: 1. Colonic diverticulosis. No acute GI hemorrhage  identified. 2. 4.3 cm infrarenal abdominal aortic aneurysm, stable since previous. Recommend follow-up every 12 months and vascular consultation. This recommendation follows ACR consensus guidelines: White Paper of the ACR Incidental Findings Committee II on Vascular Findings. J Am Coll Radiol 2013; 10:789-794. 3. Cholelithiasis with small amount of pericholecystic fluid. 4. Chronic right hydronephrosis with right renal parenchymal loss. 5.  Aortic Atherosclerosis (ICD10-I70.0).   EKG: I have personally reviewed.  Sinus rhythm, QTc 459, early R wave progression, and stable baseline.   Review of Systems:   General: no fevers, chills, no body weight gain, has fatigue HEENT: no blurry vision, hearing changes or sore throat Respiratory: no dyspnea, coughing, wheezing CV: no chest pain, no palpitations GI: no nausea, vomiting, abdominal pain, diarrhea, constipation GU: no dysuria, burning on urination, increased urinary frequency, hematuria  Ext: no leg edema Neuro: no unilateral weakness, numbness, or tingling, no vision change or hearing loss.  Has dizziness and lightheadedness. Skin: no rash, no skin tear. MSK: No muscle spasm, no deformity, no limitation of range of movement in spin Heme: No easy bruising.  Travel history: No recent long distant travel.   Allergy:  Allergies  Allergen Reactions   Codeine Itching    Past Medical History:  Diagnosis Date   Arthritis    Toes   Coronary artery disease    History of hiatal hernia    Hyperlipidemia    Hypertension    Kidney disorder    "right kidney doesn't work"   Presence of  stent in artery    "4 in legs, 1 in neck"   Vascular abnormality    Wears dentures    full upper and lower    Past Surgical History:  Procedure Laterality Date   AMPUTATION TOE Right 09/16/2021   Procedure: AMPUTATION TOE;  Surgeon: Rosetta Posner, DPM;  Location: [facility];  Service: Podiatry;  Laterality: Right;   BACK SURGERY      CAROTID STENT Right    CATARACT EXTRACTION W/PHACO Left 06/17/2019   Procedure: CATARACT EXTRACTION PHACO AND INTRAOCULAR LENS PLACEMENT (IOC) LEFT;  Surgeon: Nevada Crane, MD;  Location: [facility];  Service: Ophthalmology;  Laterality: Left;   CATARACT EXTRACTION W/PHACO Right 07/08/2019   Procedure: CATARACT EXTRACTION PHACO AND INTRAOCULAR LENS PLACEMENT (IOC) right  00:39.2  10.8%  5.02  ;  Surgeon: Nevada Crane, MD;  Location: [facility];  Service: Ophthalmology;  Laterality: Right;   COLONOSCOPY WITH PROPOFOL N/A 04/13/2023   Procedure: COLONOSCOPY WITH PROPOFOL;  Surgeon: Jaynie Collins, DO;  Location: [facility];  Service: Gastroenterology;  Laterality: N/A;   ESOPHAGOGASTRODUODENOSCOPY (EGD) WITH PROPOFOL N/A 04/13/2023   Procedure: ESOPHAGOGASTRODUODENOSCOPY (EGD) WITH PROPOFOL;  Surgeon: Jaynie Collins, DO;  Location: [facility];  Service: Gastroenterology;  Laterality: N/A;   EYE SURGERY     LEG SURGERY Bilateral    stent placement   LOWER EXTREMITY ANGIOGRAPHY Right 08/19/2021   Procedure: LOWER EXTREMITY ANGIOGRAPHY;  Surgeon: Annice Needy, MD;  Location: [facility] IN[facility] CV LAB;  Service: Cardiovascular;  Laterality: Right;   PERIPHERAL VASCULAR BALLOON ANGIOPLASTY Right 09/21/2017   Procedure: PERIPHERAL VASCULAR BALLOON ANGIOPLASTY;  Surgeon: Annice Needy, MD;  Location: [facility] INV[facility] CV [facility];  Service: Cardiovascular;  Laterality: Right;   REVERSE SHOULDER ARTHROPLASTY Right 11/26/2019   Procedure: REVERSE SHOULDER ARTHROPLASTY;  Surgeon: Christena Flake, MD;  Location: [facility] ORS;  Service: Orthopedics;  Laterality: Right;    Social History:  reports that he quit smoking about 23 years ago. His smoking use included cigarettes. He has never used smokeless tobacco. He reports that he does not currently use alcohol. He reports that he does not use drugs.  Family History:  Family History  Problem Relation Age of Onset   Bladder Cancer  Mother    Prostate cancer Father    Prostate cancer Brother    Diabetes Maternal Grandfather      Prior to Admission medications   Medication Sig Start Date End Date Taking? Authorizing Provider  acetaminophen (TYLENOL) 500 MG tablet Take 1,000 mg by mouth at bedtime.    [provider]  ALPRAZolam Prudy Feeler) 0.25 MG tablet Take 0.25 mg by mouth at bedtime. 09/07/17   [provider]  aspirin EC 81 MG tablet Take 81 mg daily by mouth.     [provider]  Cholecalciferol 25 MCG (1000 UT) capsule Take 2,000 Units by mouth daily.    [provider]  diphenhydrAMINE (BENADRYL) 25 mg capsule Take 25 mg at bedtime by mouth.     [provider]  gabapentin (NEURONTIN) 300 MG capsule Take 300 mg by mouth 2 (two) times daily. 12/27/21 03/22/23  [provider]  hydrochlorothiazide (HYDRODIURIL) 25 MG tablet Take 1 tablet (25 mg total) by mouth daily. 03/28/21   Triplett, Rulon Eisenmenger B, FNP  Multiple Vitamins-Minerals (PRESERVISION AREDS 2 PO) Take 1 tablet by mouth 2 (two) times daily.    [provider]  omeprazole (PRILOSEC) 20 MG capsule Take 20 mg by mouth at bedtime.  Patient not taking: Reported on 04/13/2023    [provider]  potassium chloride (KLOR-CON) 10 MEQ tablet Take 1 tablet by mouth daily. 02/09/23   [provider]  pravastatin (PRAVACHOL) 20 MG tablet Take 20 mg by mouth at bedtime. 08/30/16   [provider]  tamsulosin (FLOMAX) 0.4 MG CAPS capsule Take 0.4 mg by mouth daily.  Patient not taking: Reported on 04/13/2023 01/06/18   [provider]  XARELTO 20 MG TABS tablet TAKE 1 TABLET BY MOUTH EVERY DAY *STOP ELIQUIS* 04/17/23   Georgiana Spinner, NP    Physical Exam: Vitals:   05/08/23 1700 05/08/23 1730 05/08/23 1800 05/08/23 1830  BP: (!) 121/33 (!) 126/49 (!) 140/55 (!) 126/95  Pulse: 73 76 76 95  Resp: 18 18 18  (!) 24  Temp: 98.2 F (36.8 C) 97.9 F (36.6 C)    TempSrc:  Oral    SpO2: 100%  100% 100% 99%  Weight:       General: Not in acute distress.  Pale looking HEENT:       Eyes: PERRL, EOMI, no jaundice       ENT: No discharge from the ears and nose, no pharynx injection, no tonsillar enlargement.        Neck: No JVD, no bruit, no mass felt. Heme: No neck lymph node enlargement. Cardiac: S1/S2, RRR, No murmurs, No gallops or rubs. Respiratory: No rales, wheezing, rhonchi or rubs. GI: Soft, nondistended, nontender, no rebound pain, no organomegaly, BS present. GU: No hematuria Ext: No pitting leg edema bilaterally. 1+DP/PT pulse bilaterally. Musculoskeletal: No joint deformities, No joint redness or warmth, no limitation of ROM in spin. Skin: No rashes.  Neuro: Alert, oriented X3, cranial nerves II-XII grossly intact, moves all extremities normally. Psych: Patient is not psychotic, no suicidal or hemocidal ideation.  Labs on Admission: I have personally reviewed following labs and imaging studies  CBC: Recent Labs  Lab 05/08/23 1502  WBC 5.9  HGB 5.9*  HCT 20.8*  MCV 86.3  PLT 304   Basic Metabolic Panel: Recent Labs  Lab 05/08/23 1510  NA 138  K 3.4*  CL 107  CO2 21*  GLUCOSE 126*  BUN 30*  CREATININE 1.63*  CALCIUM 8.2*   GFR: Estimated Creatinine Clearance: 30.4 mL/min (A) (by C-G formula based on SCr of 1.63 mg/dL (H)). Liver Function Tests: Recent Labs  Lab 05/08/23 1510  AST 18  ALT 9  ALKPHOS 54  BILITOT 0.5  PROT 6.7  ALBUMIN 3.9   No results for input(s): "LIPASE", "AMYLASE" in the last 168 hours. No results for input(s): "AMMONIA" in the last 168 hours. Coagulation Profile: Recent Labs  Lab 05/08/23 1510  INR 2.5*   Cardiac Enzymes: No results for input(s): "CKTOTAL", "CKMB", "CKMBINDEX", "TROPONINI" in the last 168 hours. BNP (last 3 results) No results for input(s): "PROBNP" in the last 8760 hours. HbA1C: No results for input(s): "HGBA1C" in the last 72 hours. CBG: No results for input(s): "GLUCAP" in the last 168  hours. Lipid Profile: No results for input(s): "CHOL", "HDL", "LDLCALC", "TRIG", "CHOLHDL", "LDLDIRECT" in the last 72 hours. Thyroid Function Tests: No results for input(s): "TSH", "T4TOTAL", "FREET4", "T3FREE", "THYROIDAB" in the last 72 hours. Anemia Panel: Recent Labs    05/08/23 1501  RETICCTPCT 5.7*   Urine analysis:    Component Value Date/Time   COLORURINE YELLOW (A) 03/22/2023 1353   APPEARANCEUR CLEAR (A) 03/22/2023 1353   APPEARANCEUR Clear 02/09/2018 1036   LABSPEC 1.016 03/22/2023 1353  LABSPEC 1.014 03/13/2013 1403   PHURINE 5.0 03/22/2023 1353   GLUCOSEU NEGATIVE 03/22/2023 1353   GLUCOSEU Negative 03/13/2013 1403   HGBUR NEGATIVE 03/22/2023 1353   BILIRUBINUR NEGATIVE 03/22/2023 1353   BILIRUBINUR Negative 02/09/2018 1036   BILIRUBINUR Negative 03/13/2013 1403   KETONESUR NEGATIVE 03/22/2023 1353   PROTEINUR NEGATIVE 03/22/2023 1353   NITRITE NEGATIVE 03/22/2023 1353   LEUKOCYTESUR NEGATIVE 03/22/2023 1353   LEUKOCYTESUR Negative 03/13/2013 1403   Sepsis Labs: @LABRCNTIP (procalcitonin:4,lacticidven:4) )No results found for this or any previous visit (from the past 240 hour(s)).   Radiological Exams on Admission: CT Angio Abd/Pel W and/or Wo Contrast  Result Date: 05/08/2023 CLINICAL DATA:  Dizziness and generalized weakness, recent blood transfusion, lower GI bleed EXAM: CTA ABDOMEN AND PELVIS WITHOUT AND WITH CONTRAST TECHNIQUE: Multidetector CT imaging of the abdomen and pelvis was performed using the standard protocol during bolus administration of intravenous contrast. Multiplanar reconstructed images and MIPs were obtained and reviewed to evaluate the vascular anatomy. RADIATION DOSE REDUCTION: This exam was performed according to the departmental dose-optimization program which includes automated exposure control, adjustment of the mA and/or kV according to patient size and/or use of iterative reconstruction technique. CONTRAST:  80mL OMNIPAQUE IOHEXOL 350  MG/ML SOLN COMPARISON:  03/22/2023 FINDINGS: VASCULAR Aorta: Extensive partially calcified atheromatous plaque in the visualized distal descending and abdominal aorta. Fusiform infrarenal aneurysm 4.3 cm maximum diameter common (stable since previous) terminating above bifurcation. No evidence of leak or rupture. Celiac: Calcified ostial plaque without stenosis. SMA: Partially calcified ostial plaque extending over length of 2.3 cm resulting in at least mild stenosis, atheromatous but patent distally. Renals: Single left, with partially calcified ostial plaque resulting in at least mild stenosis, atheromatous but patent distally. Single right renal artery is more heavily diseased with at least moderate proximal stenosis, and plaque throughout its length. IMA: Short-segment origin occlusion, reconstituted distally by visceral collaterals. Inflow: Extensive calcified atheromatous plaque through the common iliac arteries. Patent right external iliac stent. Bilateral internal iliac ostial stenoses. Proximal Outflow: Moderately atheromatous, patent. Veins: Patent hepatic veins, portal vein, SMV, splenic vein, bilateral renal veins, iliac venous confluence and IVC. Review of the MIP images confirms the above findings. NON-VASCULAR Lower chest: No pleural or pericardial effusion. Visualized lung bases clear. Hepatobiliary: Subcentimeter calcified gallstone in the dependent aspect of the nondilated gallbladder. There is a small amount of pericholecystic fluid. No focal liver lesion or biliary ductal dilatation. Pancreas: Unremarkable. No pancreatic ductal dilatation or surrounding inflammatory changes. Spleen: Normal in size without focal abnormality. Adrenals/Urinary Tract: No adrenal mass. Right renal parenchymal loss. Chronic right hydronephrosis. No urolithiasis. Urinary bladder distended. Stomach/Bowel: Stomach is incompletely distended, without acute finding. Small bowel decompressed. Normal appendix. Scattered distal  descending and innumerable sigmoid diverticula without adjacent inflammatory change. No acute intraluminal hemorrhage is identified. Lymphatic: No abdominal or pelvic adenopathy. Reproductive: Prostate enlargement with central coarse calcifications. Other: No ascites.  No free air. Musculoskeletal: Lumbar dextroscoliosis with fusion L2-L4, multilevel spondylitic change. No fracture or worrisome bone lesion. IMPRESSION: 1. Colonic diverticulosis. No acute GI hemorrhage identified. 2. 4.3 cm infrarenal abdominal aortic aneurysm, stable since previous. Recommend follow-up every 12 months and vascular consultation. This recommendation follows ACR consensus guidelines: White Paper of the ACR Incidental Findings Committee II on Vascular Findings. J Am Coll Radiol 2013; 10:789-794. 3. Cholelithiasis with small amount of pericholecystic fluid. 4. Chronic right hydronephrosis with right renal parenchymal loss. 5.  Aortic Atherosclerosis (ICD10-I70.0). Electronically Signed   By: Corlis Leak M.D.   On:  05/08/2023 16:40      Assessment/Plan Principal Problem:   Symptomatic anemia Active Problems:   GI bleeding   Iron deficiency anemia   Hyperlipidemia   HTN (hypertension)   CAD (coronary artery disease)   Chronic kidney disease, stage 3b (HCC)   Hypokalemia   Peripheral vascular disease (HCC)   Assessment and Plan:  Symptomatic anemia due to possible GI bleeding, and iron deficiency anemia: Hgb 8.7 --> 5.9. consulted Dr. Servando Snare of GI.  - will place in tele bed obs - transfuse 2 units of blood now (EDP ordered 1 unit of blood, I changed to 2 units) - IVF: 500 cc of  NS bolus, then at 75 mL/hr - Start IV pantoprazole 40 mg bid - Zofran IV for nausea - Avoid NSAIDs and SQ heparin - Maintain IV access (2 large bore IVs if possible). - Monitor closely and follow q6h cbc, transfuse as necessary, if Hgb<7.0 - LaB: INR, PTT and type screen - check anemia panel - Hold Xarelto and  ASA  Hyperlipidemia -Pravastatin  HTN (hypertension) -Hold HCTZ since patient need IV fluid -IV hydralazine as needed  CAD (coronary artery disease) -Hold aspirin -Pravastatin  Chronic kidney disease, stage 3b (HCC): Stable.  Recent baseline creatinine 1.88 on 03/23/2023.  His creatinine is 1.63, BUN 30, GFR 41 -Follow-up with BMP  Hypokalemia: Potassium 3.4 -Repleted potassium -Check magnesium level  History of Peripheral vascular disease [facility]): Patient has extensive peripheral vascular disease.  He states that he had stent placed to both legs and stent placement to right carotid artery.  Patient also has AAA and ulcer of aorta. Pt is following up with Dr. Wyn Quaker of [facility]. -Continue pravastatin -Hold aspirin and Xarelto temporarily      DVT ppx: SCd  Code Status: Full code  Family Communication: I offered to call his family, but patient states that he already spoke with his son, daughter and wife.  They know what is going on for him.  He does not want me to call his family.   Disposition Plan:  Anticipate discharge back to previous environment  Consults called:  Dr. Servando Snare of GI  Admission status and Level of care: Telemetry Medical:  for obs   Dispo: The patient is from: Home              Anticipated d/c is to: Home              Anticipated d/c date is: 1 day              Patient currently is not medically stable to d/c.    Severity of Illness:  The appropriate patient status for this patient is OBSERVATION. Observation status is judged to be reasonable and necessary in order to provide the required intensity of service to ensure the patient's safety. [name] presenting symptoms, physical exam findings, and initial radiographic and laboratory data in the context of their medical condition is felt to place them at decreased risk for further clinical deterioration. Furthermore, it is anticipated that the patient will be medically stable for discharge from the hospital  within 2 midnights of admission.        Date of Service 05/08/2023    Lorretta Harp [facility]   If 7PM-7AM, please contact night-coverage [email or web address] 05/08/2023, 6:40 PM

## 2023-05-08 NOTE — ED Notes (Signed)
Dr. Clyde Lundborg updated ED order from 1 unit of PRBC's to 2 units. Tiffanie RN on 1C made aware.

## 2023-05-08 NOTE — ED Triage Notes (Signed)
Pt to ED for worsening dizziness and generalized weakness yesterday. Reports recently had blood transfusion.

## 2023-05-08 NOTE — ED Provider Notes (Signed)
[facility] Provider Note    Event Date/Time   First MD Initiated Contact with Patient 05/08/23 1503     (approximate)   History   Chief Complaint Dizziness   HPI  Bryan Welch is a 87 y.o. male with past medical history of hypertension, hyperlipidemia, CAD, CKD, AAA, and peripheral vascular disease on Xarelto who presents to the ED complaining of dizziness.  Patient reports that he has been feeling increasingly weak and unsteady on his feet for the past 2 days.  He states that he gets much more out of breath with any exertion but denies any pain in his chest and has not had any fevers or cough.  He initially presented to [facility] clinic and was referred to the ED for further evaluation, reportedly had hemoglobin of less than 6 found on labs at [facility] clinic.  Patient has not noticed any blood in his stool, does state that his stool has been darker than usual.  He has not had any abdominal pain, nausea, or vomiting.     Physical Exam   Triage Vital Signs: ED Triage Vitals  Enc Vitals Group     BP 05/08/23 1439 (!) 136/50     Pulse Rate 05/08/23 1439 80     Resp 05/08/23 1439 18     Temp 05/08/23 1439 97.9 F (36.6 C)     Temp src --      SpO2 05/08/23 1439 100 %     Weight 05/08/23 1438 145 lb 8.1 oz (66 kg)     Height --      Head Circumference --      Peak Flow --      Pain Score 05/08/23 1438 0     Pain Loc --      Pain Edu? --      Excl. in GC? --     Most recent vital signs: Vitals:   05/08/23 1800 05/08/23 1830  BP: (!) 140/55 (!) 126/95  Pulse: 76 95  Resp: 18 (!) 24  Temp:    SpO2: 100% 99%    Constitutional: Alert and oriented.  Pale appearing. Eyes: Conjunctivae are normal. Head: Atraumatic. Nose: No congestion/rhinnorhea. Mouth/Throat: Mucous membranes are moist.  Cardiovascular: Normal rate, regular rhythm. Grossly normal heart sounds.  2+ radial pulses bilaterally. Respiratory: Normal respiratory effort.  No  retractions. Lungs CTAB. Gastrointestinal: Soft and nontender. No distention. Musculoskeletal: No lower extremity tenderness nor edema.  Neurologic:  Normal speech and language. No gross focal neurologic deficits are appreciated.    ED Results / Procedures / Treatments   Labs (all labs ordered are listed, but only abnormal results are displayed) Labs Reviewed  CBC - Abnormal; Notable for the following components:      Result Value   RBC 2.41 (*)    Hemoglobin 5.9 (*)    HCT 20.8 (*)    MCH 24.5 (*)    MCHC 28.4 (*)    RDW 23.5 (*)    All other components within normal limits  COMPREHENSIVE METABOLIC PANEL - Abnormal; Notable for the following components:   Potassium 3.4 (*)    CO2 21 (*)    Glucose, Bld 126 (*)    BUN 30 (*)    Creatinine, Ser 1.63 (*)    Calcium 8.2 (*)    GFR, Estimated 41 (*)    All other components within normal limits  PROTIME-INR - Abnormal; Notable for the following components:   Prothrombin Time 27.1 (*)  INR 2.5 (*)    All other components within normal limits  RETICULOCYTES - Abnormal; Notable for the following components:   Retic Ct Pct 5.7 (*)    RBC. 2.43 (*)    Immature Retic Fract 40.1 (*)    All other components within normal limits  CBC  CBC  MAGNESIUM  APTT  VITAMIN B12  FOLATE  IRON AND TIBC  FERRITIN  BASIC METABOLIC PANEL  TYPE AND SCREEN  PREPARE RBC (CROSSMATCH)     EKG  ED ECG REPORT I, Chesley Noon, the attending physician, personally viewed and interpreted this ECG.   Date: 05/08/2023  EKG Time: 14:49  Rate: 78  Rhythm: normal sinus rhythm, PVCs  Axis: Normal  Intervals:none  ST&T Change: None  RADIOLOGY CTA of abdomen/pelvis reviewed and interpreted by me with no acute hemorrhage, does show AAA similar to previous.  PROCEDURES:  Critical Care performed: Yes, see critical care procedure note(s)  .Critical Care  Performed by: Chesley Noon, MD Authorized by: Chesley Noon, MD   Critical care  provider statement:    Critical care time (minutes):  30   Critical care time was exclusive of:  Separately billable procedures and treating other patients and teaching time   Critical care was necessary to treat or prevent imminent or life-threatening deterioration of the following conditions: anemia.   Critical care was time spent personally by me on the following activities:  Development of treatment plan with patient or surrogate, discussions with consultants, evaluation of patient's response to treatment, examination of patient, ordering and review of laboratory studies, ordering and review of radiographic studies, ordering and performing treatments and interventions, pulse oximetry, re-evaluation of patient's condition and review of old charts   I assumed direction of critical care for this patient from another provider in my specialty: no     Care discussed with: admitting provider      MEDICATIONS ORDERED IN ED: Medications  0.9 %  sodium chloride infusion (has no administration in time range)  pantoprazole (PROTONIX) injection 40 mg (has no administration in time range)  ondansetron (ZOFRAN) injection 4 mg (has no administration in time range)  acetaminophen (TYLENOL) tablet 650 mg (has no administration in time range)  hydrALAZINE (APRESOLINE) injection 5 mg (has no administration in time range)  sodium chloride 0.9 % bolus 500 mL (has no administration in time range)  0.9 %  sodium chloride infusion (has no administration in time range)  potassium chloride SA (KLOR-CON M) CR tablet 40 mEq (has no administration in time range)  pantoprazole (PROTONIX) injection 40 mg (40 mg Intravenous Given 05/08/23 1608)  iohexol (OMNIPAQUE) 350 MG/ML injection 80 mL (80 mLs Intravenous Contrast Given 05/08/23 1551)     IMPRESSION / MDM / ASSESSMENT AND PLAN / ED COURSE  I reviewed the triage vital signs and the nursing notes.                              87 y.o. male with past medical history  of hypertension, hyperlipidemia, CAD, CKD, AAA, and peripheral vascular disease on Xarelto who presents to the ED complaining of increasing weakness and dyspnea on exertion over the past 2 days.  Patient's presentation is most consistent with acute presentation with potential threat to life or bodily function.  Differential diagnosis includes, but is not limited to, anemia, upper GI bleed, lower GI bleed, anemia, electrolyte abnormality, AKI, aortoenteric fistula.  Patient nontoxic-appearing and in no acute distress,  vital signs are unremarkable.  Labs here in the ED confirmed significant anemia with hemoglobin of 5.9 compared to 8.7 about a month and a half ago.  With his recent dark stools I am concerned for GI source of bleeding, although he had recent EGD and colonoscopy that was remarkable only for gastritis.  Will give dose of IV Protonix, also further assess with CT a of his abdomen/pelvis given known AAA.  Plan to transfuse 1 unit PRBCs, patient remains hemodynamically stable at this time.  Labs without significant leukocytosis or electrolyte abnormality, renal function stable compared to previous and LFTs are unremarkable.  CTA of abdomen/pelvis negative for active bleeding, AAA stable compared to previous.  Patient remains hemodynamically stable, case discussed with Dr. Servando Snare of GI, who will see the patient in consultation.  Case discussed with hospitalist for admission.      FINAL CLINICAL IMPRESSION(S) / ED DIAGNOSES   Final diagnoses:  Anemia, unspecified type  Dizziness     Rx / DC Orders   ED Discharge Orders     None        Note:  This document was prepared using Dragon voice recognition software and may include unintentional dictation errors.   Chesley Noon, MD 05/09/23 [phone]

## 2023-05-09 ENCOUNTER — Encounter
Admission: EM | Disposition: A | Discharge status: Home Health | Payer: Self-pay | Source: Home / Self Care | Attending: Student

## 2023-05-09 ENCOUNTER — Encounter: Payer: Self-pay | Admitting: Gastroenterology

## 2023-05-09 ENCOUNTER — Inpatient Hospital Stay: Payer: Medicare Other | Attending: Internal Medicine

## 2023-05-09 DIAGNOSIS — D649 Anemia, unspecified: Secondary | ICD-10-CM | POA: Diagnosis not present

## 2023-05-09 DIAGNOSIS — D509 Iron deficiency anemia, unspecified: Secondary | ICD-10-CM | POA: Insufficient documentation

## 2023-05-09 HISTORY — PX: GIVENS CAPSULE STUDY: SHX5432

## 2023-05-09 LAB — CBC
HCT: 21.1 % — ABNORMAL LOW (ref 39.0–52.0)
HCT: 25.6 % — ABNORMAL LOW (ref 39.0–52.0)
HCT: 25.3 % — ABNORMAL LOW (ref 39.0–52.0)
HCT: 25.9 % — ABNORMAL LOW (ref 39.0–52.0)
Hemoglobin: 6.4 g/dL — ABNORMAL LOW (ref 13.0–17.0)
Hemoglobin: 7.8 g/dL — ABNORMAL LOW (ref 13.0–17.0)
Hemoglobin: 7.6 g/dL — ABNORMAL LOW (ref 13.0–17.0)
Hemoglobin: 7.9 g/dL — ABNORMAL LOW (ref 13.0–17.0)
MCH: 25.5 pg — ABNORMAL LOW (ref 26.0–34.0)
MCH: 26.1 pg (ref 26.0–34.0)
MCH: 25.8 pg — ABNORMAL LOW (ref 26.0–34.0)
MCH: 26 pg (ref 26.0–34.0)
MCHC: 30.3 g/dL (ref 30.0–36.0)
MCHC: 30.5 g/dL (ref 30.0–36.0)
MCHC: 30 g/dL (ref 30.0–36.0)
MCHC: 30.5 g/dL (ref 30.0–36.0)
MCV: 84.1 fL (ref 80.0–100.0)
MCV: 85.6 fL (ref 80.0–100.0)
MCV: 85.8 fL (ref 80.0–100.0)
MCV: 85.2 fL (ref 80.0–100.0)
Platelets: 279 10*3/uL (ref 150–400)
Platelets: 272 10*3/uL (ref 150–400)
Platelets: 267 10*3/uL (ref 150–400)
Platelets: 285 10*3/uL (ref 150–400)
RBC: 2.51 MIL/uL — ABNORMAL LOW (ref 4.22–5.81)
RBC: 2.99 MIL/uL — ABNORMAL LOW (ref 4.22–5.81)
RBC: 2.95 MIL/uL — ABNORMAL LOW (ref 4.22–5.81)
RBC: 3.04 MIL/uL — ABNORMAL LOW (ref 4.22–5.81)
RDW: 22.1 % — ABNORMAL HIGH (ref 11.5–15.5)
RDW: 21.7 % — ABNORMAL HIGH (ref 11.5–15.5)
RDW: 21.4 % — ABNORMAL HIGH (ref 11.5–15.5)
RDW: 21.4 % — ABNORMAL HIGH (ref 11.5–15.5)
WBC: 8 10*3/uL (ref 4.0–10.5)
WBC: 6.8 10*3/uL (ref 4.0–10.5)
WBC: 6.6 10*3/uL (ref 4.0–10.5)
WBC: 6.9 10*3/uL (ref 4.0–10.5)
nRBC: 0.3 % — ABNORMAL HIGH (ref 0.0–0.2)
nRBC: 0 % (ref 0.0–0.2)
nRBC: 0 % (ref 0.0–0.2)
nRBC: 0 % (ref 0.0–0.2)

## 2023-05-09 LAB — BASIC METABOLIC PANEL
Anion gap: 9 (ref 5–15)
BUN: 26 mg/dL — ABNORMAL HIGH (ref 8–23)
CO2: 21 mmol/L — ABNORMAL LOW (ref 22–32)
Calcium: 7.9 mg/dL — ABNORMAL LOW (ref 8.9–10.3)
Chloride: 109 mmol/L (ref 98–111)
Creatinine, Ser: 1.39 mg/dL — ABNORMAL HIGH (ref 0.61–1.24)
GFR, Estimated: 50 mL/min — ABNORMAL LOW (ref >60)
Glucose, Bld: 91 mg/dL (ref 70–99)
Potassium: 3.7 mmol/L (ref 3.5–5.1)
Sodium: 139 mmol/L (ref 135–145)

## 2023-05-09 LAB — BPAM RBC
Blood Product Expiration Date: 202407032359
Blood Product Expiration Date: 202408052359

## 2023-05-09 LAB — TYPE AND SCREEN

## 2023-05-09 LAB — GLUCOSE, CAPILLARY: Glucose-Capillary: 89 mg/dL (ref 70–99)

## 2023-05-09 LAB — VITAMIN B12: Vitamin B-12: 380 pg/mL (ref 180–914)

## 2023-05-09 LAB — PREPARE RBC (CROSSMATCH)

## 2023-05-09 SURGERY — IMAGING PROCEDURE, GI TRACT, INTRALUMINAL, VIA CAPSULE

## 2023-05-09 MED ORDER — ACETAMINOPHEN 325 MG PO TABS
650.0000 mg | ORAL_TABLET | Freq: Once | ORAL | Status: AC
  Administered 2023-05-09: 650 mg via ORAL
  Filled 2023-05-09: qty 2

## 2023-05-09 MED ORDER — DIPHENHYDRAMINE HCL 25 MG PO CAPS
25.0000 mg | ORAL_CAPSULE | Freq: Once | ORAL | Status: AC
  Administered 2023-05-09: 25 mg via ORAL
  Filled 2023-05-09: qty 1

## 2023-05-09 MED ORDER — SODIUM CHLORIDE 0.9% IV SOLUTION: Freq: Once | INTRAVENOUS | Status: AC

## 2023-05-09 MED ORDER — PANTOPRAZOLE SODIUM 40 MG IV SOLR
40.0000 mg | Freq: Two times a day (BID) | INTRAVENOUS | Status: DC
  Administered 2023-05-10 – 2023-05-12 (×5): 40 mg via INTRAVENOUS
  Filled 2023-05-09 (×5): qty 10

## 2023-05-09 MED ORDER — SODIUM CHLORIDE 0.9 % IV SOLN
200.0000 mg | Freq: Once | INTRAVENOUS | Status: AC
  Administered 2023-05-09: 200 mg via INTRAVENOUS
  Filled 2023-05-09: qty 200

## 2023-05-09 NOTE — Progress Notes (Signed)
Pt with ongoing capsule study. Per MD, patient unable to have any intake until after 1030. Will administer medications then.

## 2023-05-09 NOTE — Plan of Care (Signed)

## 2023-05-09 NOTE — Progress Notes (Signed)
PROGRESS NOTE    Bryan FIFIELD   Welch:[id number] DOB: 1935/11/16  DOA: 05/08/2023 Date of Service: 05/09/23 PCP: Gracelyn Nurse, MD     Brief Narrative / Hospital Course:  Bryan Welch is a 87 y.o. male with medical history significant of AAA, ulcer of aorta, carotid artery stenosis, peripheral vascular disease on Xarelto, HTN, HLD, CAD, anixety, CKD-3b, iron deficiency anemia on IV iron, who presents with weakness, dizziness and shortness of breath. He took last dose of ASA and Xarelto in morning 05/08/23. Hx iron deficiency, on IV iron weekly x5 doses started 06/14.  Of note, he underwent an EGD and colonoscopy by Dr. Timothy Lasso last month. At that time the patient had the findings of a large ileocecal valve polyp with a 11 to 12 mm polyp in the ascending colon with a smaller polyp in the cecum and ascending colon. 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 There was no sign of active bleeding on Dr. Ferd Hibbs procedures.   07/01: Hgb 8.8 on 5/16 --> 5.9 in the ED. CTA of abdomen/pelvis showed colonic diverticulosis, but no active GI bleeding.  Dr. Servando Snare of GI was consulted.  07/02: capsule endoscopy multiple sites of bleeding, largest sire in proximal small bowel. Dr Servando Snare planning EGD tomorrow morning.   Consultants:  Gastroenterology   Procedures: Capsule endoscopy 05/09/23       ASSESSMENT & PLAN:   Principal Problem:   Symptomatic anemia Active Problems:   GI bleeding   Iron deficiency anemia   Hyperlipidemia   HTN (hypertension)   CAD (coronary artery disease)   Chronic kidney disease, stage 3b (HCC)   Hypokalemia   Peripheral vascular disease (HCC)  Symptomatic anemia due to upper small intestinal GI bleeding, complicated by underlying iron deficiency anemia:  Hgb 8.7 --> 5.9  S/p 2 units PRBC and dose IV Venofer  Plan EGD tomorrow w/ Dr Servando Snare to attempt achieve hemostasis  NPO p mn IV pantoprazole 40 mg bid Zofran IV for  nausea Avoid NSAIDs and SQ heparin Maintain IV access (2 large bore IVs if possible). Monitor closely and follow q6h cbc, transfuse as necessary, if Hgb<7.0 Hold Xarelto and ASA   Hyperlipidemia Pravastatin   HTN (hypertension) Hold HCTZ since patient need IV fluid IV hydralazine as needed   CAD (coronary artery disease) Hold aspirin Pravastatin   Chronic kidney disease, stage 3b (HCC):  Stable.  Recent baseline creatinine 1.88 on 03/23/2023.   His creatinine is 1.63, BUN 30, GFR 41 Follow-up with BMP   Hypokalemia:  Potassium 3.4 Repleted potassium Check magnesium level   History of Peripheral vascular disease (HCC):  stent placed to both legs and stent placement to right carotid artery.  Patient also has AAA and ulcer of aorta. Pt is following up with Dr. Wyn Quaker of [facility]. Continue pravastatin Hold aspirin and Xarelto as above      DVT prophylaxis: SCD Pertinent IV fluids/nutrition: none at this time Central lines / invasive devices: none  Code Status: FULL CODE ACP documentation reviewed: none on file   Current Admission Status: observation, may need to be inpatient will contact utilization review   TOC needs / Dispo plan: TBD pending clinical course  Barriers to discharge / significant pending items: EGD tomorrow, if unable to achieve hemostasis may need further procedures              Subjective / Brief ROS:  Patient reports doing well, no pain, lightheadedness is better  Denies CP/SOB.  Pain controlled.  Denies new weakness.  Reports no concerns w/ urination/defecation.   Family Communication: none at this time, pt is in communication w/ his wife     Objective Findings:  Vitals:   05/09/23 0432 05/09/23 0650 05/09/23 0728 05/09/23 1609  BP: 100/76 112/60 115/64 (!) 105/49  Pulse: 74 71 71 77  Resp: 18 18 18 18   Temp: 98.1 F (36.7 C) 98.3 F (36.8 C) 98.1 F (36.7 C) 97.9 F (36.6 C)  TempSrc: Oral     SpO2: 99% 99% 99% 100%  Weight:       Height:        Intake/Output Summary (Last 24 hours) at 05/09/2023 1638 Last data filed at 05/09/2023 1500 Gross per 24 hour  Intake 640 ml  Output 1425 ml  Net -785 ml   Filed Weights   05/08/23 1438 05/08/23 2330  Weight: 66 kg 65.5 kg    Examination:  Physical Exam Constitutional:      General: He is in acute distress.     Appearance: Normal appearance.  Cardiovascular:     Rate and Rhythm: Normal rate and regular rhythm.  Pulmonary:     Effort: Pulmonary effort is normal.     Breath sounds: Normal breath sounds.  Abdominal:     General: Abdomen is flat.     Palpations: Abdomen is soft.  Musculoskeletal:     Right lower leg: No edema.     Left lower leg: No edema.  Neurological:     General: No focal deficit present.     Mental Status: He is alert and oriented to person, place, and time. Mental status is at baseline.  Psychiatric:        Mood and Affect: Mood normal.        Behavior: Behavior normal.          Scheduled Medications:   ALPRAZolam  0.25 mg Oral QHS   cholecalciferol  2,000 Units Oral Daily   diphenhydrAMINE  25 mg Oral QHS   gabapentin  300 mg Oral BID   multivitamin  1 tablet Oral BID   pantoprazole (PROTONIX) IV  40 mg Intravenous Q12H   pravastatin  20 mg Oral Daily   tamsulosin  0.4 mg Oral Daily    Continuous Infusions:  sodium chloride      PRN Medications:  acetaminophen, hydrALAZINE, ondansetron (ZOFRAN) IV  Antimicrobials from admission:  Anti-infectives (From admission, onward)    None           Data Reviewed:  I have personally reviewed the following...  CBC: Recent Labs  Lab 05/08/23 1502 05/08/23 2137 05/09/23 0122 05/09/23 0711 05/09/23 1312  WBC 5.9 7.2 8.0 6.8 6.6  HGB 5.9* 7.0* 6.4* 7.8* 7.6*  HCT 20.8* 23.4* 21.1* 25.6* 25.3*  MCV 86.3 84.2 84.1 85.6 85.8  PLT 304 292 279 272 267   Basic Metabolic Panel: Recent Labs  Lab 05/08/23 1508 05/08/23 1510 05/09/23 0122  NA  --  138 139  K  --   3.4* 3.7  CL  --  107 109  CO2  --  21* 21*  GLUCOSE  --  126* 91  BUN  --  30* 26*  CREATININE  --  1.63* 1.39*  CALCIUM  --  8.2* 7.9*  MG 1.8  --   --    GFR: Estimated Creatinine Clearance: 35.7 mL/min (A) (by C-G formula based on SCr of 1.39 mg/dL (H)). Liver Function Tests: Recent Labs  Lab 05/08/23 1510  AST 18  ALT 9  ALKPHOS 54  BILITOT 0.5  PROT 6.7  ALBUMIN 3.9   No results for input(s): "LIPASE", "AMYLASE" in the last 168 hours. No results for input(s): "AMMONIA" in the last 168 hours. Coagulation Profile: Recent Labs  Lab 05/08/23 1510  INR 2.5*   Cardiac Enzymes: No results for input(s): "CKTOTAL", "CKMB", "CKMBINDEX", "TROPONINI" in the last 168 hours. BNP (last 3 results) No results for input(s): "PROBNP" in the last 8760 hours. HbA1C: No results for input(s): "HGBA1C" in the last 72 hours. CBG: Recent Labs  Lab 05/09/23 0729  GLUCAP 89   Lipid Profile: No results for input(s): "CHOL", "HDL", "LDLCALC", "TRIG", "CHOLHDL", "LDLDIRECT" in the last 72 hours. Thyroid Function Tests: No results for input(s): "TSH", "T4TOTAL", "FREET4", "T3FREE", "THYROIDAB" in the last 72 hours. Anemia Panel: Recent Labs    05/08/23 1501 05/08/23 1508 05/08/23 2137  VITAMINB12  --   --  380  FOLATE  --  12.1  --   FERRITIN  --  54  --   TIBC  --  414  --   IRON  --  22*  --   RETICCTPCT 5.7*  --   --    Most Recent Urinalysis On File:     Component Value Date/Time   COLORURINE YELLOW (A) 03/22/2023 1353   APPEARANCEUR CLEAR (A) 03/22/2023 1353   APPEARANCEUR Clear 02/09/2018 1036   LABSPEC 1.016 03/22/2023 1353   LABSPEC 1.014 03/13/2013 1403   PHURINE 5.0 03/22/2023 1353   GLUCOSEU NEGATIVE 03/22/2023 1353   GLUCOSEU Negative 03/13/2013 1403   HGBUR NEGATIVE 03/22/2023 1353   BILIRUBINUR NEGATIVE 03/22/2023 1353   BILIRUBINUR Negative 02/09/2018 1036   BILIRUBINUR Negative 03/13/2013 1403   KETONESUR NEGATIVE 03/22/2023 1353   PROTEINUR NEGATIVE  03/22/2023 1353   NITRITE NEGATIVE 03/22/2023 1353   LEUKOCYTESUR NEGATIVE 03/22/2023 1353   LEUKOCYTESUR Negative 03/13/2013 1403   Sepsis Labs: @LABRCNTIP (procalcitonin:4,lacticidven:4) Microbiology: No results found for this or any previous visit (from the past 240 hour(s)).    Radiology Studies last 3 days: CT Angio Abd/Pel W and/or Wo Contrast  Result Date: 05/08/2023 CLINICAL DATA:  Dizziness and generalized weakness, recent blood transfusion, lower GI bleed EXAM: CTA ABDOMEN AND PELVIS WITHOUT AND WITH CONTRAST TECHNIQUE: Multidetector CT imaging of the abdomen and pelvis was performed using the standard protocol during bolus administration of intravenous contrast. Multiplanar reconstructed images and MIPs were obtained and reviewed to evaluate the vascular anatomy. RADIATION DOSE REDUCTION: This exam was performed according to the departmental dose-optimization program which includes automated exposure control, adjustment of the mA and/or kV according to patient size and/or use of iterative reconstruction technique. CONTRAST:  80mL OMNIPAQUE IOHEXOL 350 MG/ML SOLN COMPARISON:  03/22/2023 FINDINGS: VASCULAR Aorta: Extensive partially calcified atheromatous plaque in the visualized distal descending and abdominal aorta. Fusiform infrarenal aneurysm 4.3 cm maximum diameter common (stable since previous) terminating above bifurcation. No evidence of leak or rupture. Celiac: Calcified ostial plaque without stenosis. SMA: Partially calcified ostial plaque extending over length of 2.3 cm resulting in at least mild stenosis, atheromatous but patent distally. Renals: Single left, with partially calcified ostial plaque resulting in at least mild stenosis, atheromatous but patent distally. Single right renal artery is more heavily diseased with at least moderate proximal stenosis, and plaque throughout its length. IMA: Short-segment origin occlusion, reconstituted distally by visceral collaterals. Inflow:  Extensive calcified atheromatous plaque through the common iliac arteries. Patent right external iliac stent. Bilateral internal iliac ostial stenoses.  Proximal Outflow: Moderately atheromatous, patent. Veins: Patent hepatic veins, portal vein, SMV, splenic vein, bilateral renal veins, iliac venous confluence and IVC. Review of the MIP images confirms the above findings. NON-VASCULAR Lower chest: No pleural or pericardial effusion. Visualized lung bases clear. Hepatobiliary: Subcentimeter calcified gallstone in the dependent aspect of the nondilated gallbladder. There is a small amount of pericholecystic fluid. No focal liver lesion or biliary ductal dilatation. Pancreas: Unremarkable. No pancreatic ductal dilatation or surrounding inflammatory changes. Spleen: Normal in size without focal abnormality. Adrenals/Urinary Tract: No adrenal mass. Right renal parenchymal loss. Chronic right hydronephrosis. No urolithiasis. Urinary bladder distended. Stomach/Bowel: Stomach is incompletely distended, without acute finding. Small bowel decompressed. Normal appendix. Scattered distal descending and innumerable sigmoid diverticula without adjacent inflammatory change. No acute intraluminal hemorrhage is identified. Lymphatic: No abdominal or pelvic adenopathy. Reproductive: Prostate enlargement with central coarse calcifications. Other: No ascites.  No free air. Musculoskeletal: Lumbar dextroscoliosis with fusion L2-L4, multilevel spondylitic change. No fracture or worrisome bone lesion. IMPRESSION: 1. Colonic diverticulosis. No acute GI hemorrhage identified. 2. 4.3 cm infrarenal abdominal aortic aneurysm, stable since previous. Recommend follow-up every 12 months and vascular consultation. This recommendation follows ACR consensus guidelines: White Paper of the ACR Incidental Findings Committee II on Vascular Findings. J Am Coll Radiol 2013; 10:789-794. 3. Cholelithiasis with small amount of pericholecystic fluid. 4.  Chronic right hydronephrosis with right renal parenchymal loss. 5.  Aortic Atherosclerosis (ICD10-I70.0). Electronically Signed   By: Corlis Leak M.D.   On: 05/08/2023 16:40             LOS: 0 days     Sunnie Nielsen, DO [facility] 05/09/2023, 4:38 PM    Dictation software may have been used to generate the above note. Typos may occur and escape review in typed/dictated notes. Please contact Dr Lyn Hollingshead directly for clarity if needed.  Staff may message me via secure chat in Epic  but this may not receive an immediate response,  please page me for urgent matters!  If 7PM-7AM, please contact night coverage [email or web address]

## 2023-05-09 NOTE — Consult Note (Signed)
Midge Minium, MD [facility]  [address], [address] [place], [place] [address] Phone: [phone] Fax : [phone]  Consultation  Referring Provider:     Dr. Clyde Lundborg Primary Care Physician:  Gracelyn Nurse, MD Primary Gastroenterologist:  Dr. Timothy Lasso         Reason for Consultation:     GI bleed  Date of Admission:  05/08/2023 Date of Consultation:  05/09/2023         HPI:   Bryan Welch is a 87 y.o. male with a history of anemia and underwent an EGD and colonoscopy by Dr. Timothy Lasso last month.  At that time the patient had the findings of a large ileocecal valve polyp with a 11 to 12 mm polyp in the ascending colon with a smaller polyp in the cecum and ascending colon.  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  There was no sign of active bleeding on Dr. Ferd Hibbs procedures.  The patient was reported to have dark stools but when asked about dark stools this morning the patient states that people keep telling him this but he does not recall ever telling anybody that he had dark stools. The patient reports that he has been feeling dizzy for last few days with some weakness.  The hospitalist also mention that the ED physician stated that the patient had dark stools although the patient had denied dark stools to the hospitalist admitting the patient the patient's blood work has shown:  Component     Latest Ref Rng 03/22/2023 03/23/2023 05/08/2023 05/09/2023  Hemoglobin     13.0 - 17.0 g/dL 8.6 (L)  8.7 (L)  7.0 (L)  6.4 (L)   Hemoglobin      6.1 (L)   5.9 (L)    HCT     39.0 - 52.0 % 27.7 (L)  27.8 (L)  23.4 (L)  21.1 (L)   HCT      20.0 (L)   20.8 (L)     Is now being transfused.  The patient has also had his Xarelto held.  It appears that the patient does have a repeat colonoscopy planned at [facility] to have the ileocecal valve lesion removed on August 29 of this year.  Past Medical History:  Diagnosis Date   Arthritis    Toes   Coronary  artery disease    History of hiatal hernia    Hyperlipidemia    Hypertension    Kidney disorder    "right kidney doesn't work"   Presence of stent in artery    "4 in legs, 1 in neck"   Vascular abnormality    Wears dentures    full upper and lower    Past Surgical History:  Procedure Laterality Date   AMPUTATION TOE Right 09/16/2021   Procedure: AMPUTATION TOE;  Surgeon: Rosetta Posner, DPM;  Location: [facility];  Service: Podiatry;  Laterality: Right;   BACK SURGERY     CAROTID STENT Right    CATARACT EXTRACTION W/PHACO Left 06/17/2019   Procedure: CATARACT EXTRACTION PHACO AND INTRAOCULAR LENS PLACEMENT (IOC) LEFT;  Surgeon: Nevada Crane, MD;  Location: [facility];  Service: Ophthalmology;  Laterality: Left;   CATARACT EXTRACTION W/PHACO Right 07/08/2019   Procedure: CATARACT EXTRACTION PHACO AND INTRAOCULAR LENS PLACEMENT (IOC) right  00:39.2  10.8%  5.02  ;  Surgeon: Nevada Crane, MD;  Location: [facility];  Service: Ophthalmology;  Laterality:  Right;   COLONOSCOPY WITH PROPOFOL N/A 04/13/2023   Procedure: COLONOSCOPY WITH PROPOFOL;  Surgeon: Jaynie Collins, DO;  Location: [facility] ENDOSCOPY;  Service: Gastroenterology;  Laterality: N/A;   ESOPHAGOGASTRODUODENOSCOPY (EGD) WITH PROPOFOL N/A 04/13/2023   Procedure: ESOPHAGOGASTRODUODENOSCOPY (EGD) WITH PROPOFOL;  Surgeon: Jaynie Collins, DO;  Location: [facility] ENDOSCOPY;  Service: Gastroenterology;  Laterality: N/A;   EYE SURGERY     LEG SURGERY Bilateral    stent placement   LOWER EXTREMITY ANGIOGRAPHY Right 08/19/2021   Procedure: LOWER EXTREMITY ANGIOGRAPHY;  Surgeon: Annice Needy, MD;  Location: [facility] INVASIVE CV LAB;  Service: Cardiovascular;  Laterality: Right;   PERIPHERAL VASCULAR BALLOON ANGIOPLASTY Right 09/21/2017   Procedure: PERIPHERAL VASCULAR BALLOON ANGIOPLASTY;  Surgeon: Annice Needy, MD;  Location: [facility] INVASIVE CV LAB;  Service: Cardiovascular;  Laterality: Right;    REVERSE SHOULDER ARTHROPLASTY Right 11/26/2019   Procedure: REVERSE SHOULDER ARTHROPLASTY;  Surgeon: Christena Flake, MD;  Location: [facility] ORS;  Service: Orthopedics;  Laterality: Right;    Prior to Admission medications   Medication Sig Start Date End Date Taking? Authorizing Provider  acetaminophen (TYLENOL) 500 MG tablet Take 1,000 mg by mouth at bedtime.   Yes [provider]  ALPRAZolam (XANAX) 0.25 MG tablet Take 0.25 mg by mouth at bedtime. 09/07/17  Yes [provider]  aspirin EC 81 MG tablet Take 81 mg daily by mouth.    Yes [provider]  Cholecalciferol 25 MCG (1000 UT) capsule Take 2,000 Units by mouth daily.   Yes [provider]  diphenhydrAMINE (BENADRYL) 25 mg capsule Take 25 mg at bedtime by mouth.    Yes [provider]  gabapentin (NEURONTIN) 300 MG capsule Take 300 mg by mouth 2 (two) times daily. 12/27/21 05/08/23 Yes [provider]  hydrochlorothiazide (HYDRODIURIL) 25 MG tablet Take 1 tablet (25 mg total) by mouth daily. 03/28/21  Yes Triplett, Cari B, FNP  Multiple Vitamins-Minerals (PRESERVISION AREDS 2 PO) Take 1 tablet by mouth 2 (two) times daily.   Yes [provider]  mupirocin ointment (BACTROBAN) 2 % Apply 1 Application topically 3 (three) times daily. 03/30/23  Yes [provider]  omeprazole (PRILOSEC) 20 MG capsule Take 20 mg by mouth at bedtime.   Yes [provider]  potassium chloride (KLOR-CON) 10 MEQ tablet Take 10 mEq by mouth daily. 02/09/23  Yes [provider]  pravastatin (PRAVACHOL) 20 MG tablet Take 20 mg by mouth at bedtime. 08/30/16  Yes [provider]  tamsulosin (FLOMAX) 0.4 MG CAPS capsule Take 0.4 mg by mouth daily. 01/06/18  Yes [provider]  XARELTO 20 MG TABS tablet TAKE 1 TABLET BY MOUTH EVERY DAY *STOP ELIQUIS* Patient taking differently: Take 20 mg by mouth daily. 04/17/23  Yes Georgiana Spinner, NP    Family History  Problem Relation  Age of Onset   Bladder Cancer Mother    Prostate cancer Father    Prostate cancer Brother    Diabetes Maternal Grandfather      Social History   Tobacco Use   Smoking status: Former    Types: Cigarettes    Quit date: 2001    Years since quitting: 23.5   Smokeless tobacco: Never  Vaping Use   Vaping Use: Never used  Substance Use Topics   Alcohol use: Not Currently   Drug use: No    Allergies as of 05/08/2023 - Review Complete 05/08/2023  Allergen Reaction Noted   Codeine Itching 03/18/2014    Review of  Systems:    All systems reviewed and negative except where noted in HPI.   Physical Exam:  Vital signs in last 24 hours: Temp:  [97.8 F (36.6 C)-98.5 F (36.9 C)] 98.3 F (36.8 C) (07/02 0650) Pulse Rate:  [71-95] 71 (07/02 0650) Resp:  [17-24] 18 (07/02 0650) BP: (98-140)/(33-95) 112/60 (07/02 0650) SpO2:  [92 %-100 %] 99 % (07/02 0650) Weight:  [65.5 kg-66 kg] 65.5 kg (07/01 2330) Last BM Date : 05/07/23 General:   Pleasant, cooperative in NAD Head:  Normocephalic and atraumatic. Eyes:   No icterus.   Conjunctiva pale. PERRLA. Ears:  Normal auditory acuity. Neck:  Supple; no masses or thyroidomegaly Lungs: Respirations even and unlabored. Lungs clear to auscultation bilaterally.   No wheezes, crackles, or rhonchi.  Heart:  Regular rate and rhythm;  Without murmur, clicks, rubs or gallops Abdomen:  Soft, nondistended, nontender. Normal bowel sounds. No appreciable masses or hepatomegaly.  No rebound or guarding.  Rectal:  Not performed. Msk:  Symmetrical without gross deformities.   Extremities:  Without edema, cyanosis or clubbing. Neurologic:  Alert and oriented x3;  grossly normal neurologically. Skin:  Intact without significant lesions or rashes. Cervical Nodes:  No significant cervical adenopathy. Psych:  Alert and cooperative. Normal affect.  LAB RESULTS: Recent Labs    05/08/23 1502 05/08/23 2137 05/09/23 0122  WBC 5.9 7.2 8.0  HGB 5.9* 7.0*  6.4*  HCT 20.8* 23.4* 21.1*  PLT 304 292 279   BMET Recent Labs    05/08/23 1510 05/09/23 0122  NA 138 139  K 3.4* 3.7  CL 107 109  CO2 21* 21*  GLUCOSE 126* 91  BUN 30* 26*  CREATININE 1.63* 1.39*  CALCIUM 8.2* 7.9*   LFT Recent Labs    05/08/23 1510  PROT 6.7  ALBUMIN 3.9  AST 18  ALT 9  ALKPHOS 54  BILITOT 0.5   PT/INR Recent Labs    05/08/23 1510  LABPROT 27.1*  INR 2.5*    STUDIES: CT Angio Abd/Pel W and/or Wo Contrast  Result Date: 05/08/2023 CLINICAL DATA:  Dizziness and generalized weakness, recent blood transfusion, lower GI bleed EXAM: CTA ABDOMEN AND PELVIS WITHOUT AND WITH CONTRAST TECHNIQUE: Multidetector CT imaging of the abdomen and pelvis was performed using the standard protocol during bolus administration of intravenous contrast. Multiplanar reconstructed images and MIPs were obtained and reviewed to evaluate the vascular anatomy. RADIATION DOSE REDUCTION: This exam was performed according to the departmental dose-optimization program which includes automated exposure control, adjustment of the mA and/or kV according to patient size and/or use of iterative reconstruction technique. CONTRAST:  80mL OMNIPAQUE IOHEXOL 350 MG/ML SOLN COMPARISON:  03/22/2023 FINDINGS: VASCULAR Aorta: Extensive partially calcified atheromatous plaque in the visualized distal descending and abdominal aorta. Fusiform infrarenal aneurysm 4.3 cm maximum diameter common (stable since previous) terminating above bifurcation. No evidence of leak or rupture. Celiac: Calcified ostial plaque without stenosis. SMA: Partially calcified ostial plaque extending over length of 2.3 cm resulting in at least mild stenosis, atheromatous but patent distally. Renals: Single left, with partially calcified ostial plaque resulting in at least mild stenosis, atheromatous but patent distally. Single right renal artery is more heavily diseased with at least moderate proximal stenosis, and plaque throughout  its length. IMA: Short-segment origin occlusion, reconstituted distally by visceral collaterals. Inflow: Extensive calcified atheromatous plaque through the common iliac arteries. Patent right external iliac stent. Bilateral internal iliac ostial stenoses. Proximal Outflow: Moderately atheromatous, patent. Veins: Patent hepatic veins, portal vein, SMV, splenic vein,  bilateral renal veins, iliac venous confluence and IVC. Review of the MIP images confirms the above findings. NON-VASCULAR Lower chest: No pleural or pericardial effusion. Visualized lung bases clear. Hepatobiliary: Subcentimeter calcified gallstone in the dependent aspect of the nondilated gallbladder. There is a small amount of pericholecystic fluid. No focal liver lesion or biliary ductal dilatation. Pancreas: Unremarkable. No pancreatic ductal dilatation or surrounding inflammatory changes. Spleen: Normal in size without focal abnormality. Adrenals/Urinary Tract: No adrenal mass. Right renal parenchymal loss. Chronic right hydronephrosis. No urolithiasis. Urinary bladder distended. Stomach/Bowel: Stomach is incompletely distended, without acute finding. Small bowel decompressed. Normal appendix. Scattered distal descending and innumerable sigmoid diverticula without adjacent inflammatory change. No acute intraluminal hemorrhage is identified. Lymphatic: No abdominal or pelvic adenopathy. Reproductive: Prostate enlargement with central coarse calcifications. Other: No ascites.  No free air. Musculoskeletal: Lumbar dextroscoliosis with fusion L2-L4, multilevel spondylitic change. No fracture or worrisome bone lesion. IMPRESSION: 1. Colonic diverticulosis. No acute GI hemorrhage identified. 2. 4.3 cm infrarenal abdominal aortic aneurysm, stable since previous. Recommend follow-up every 12 months and vascular consultation. This recommendation follows ACR consensus guidelines: White Paper of the ACR Incidental Findings Committee II on Vascular Findings. J  Am Coll Radiol 2013; 10:789-794. 3. Cholelithiasis with small amount of pericholecystic fluid. 4. Chronic right hydronephrosis with right renal parenchymal loss. 5.  Aortic Atherosclerosis (ICD10-I70.0). Electronically Signed   By: Corlis Leak M.D.   On: 05/08/2023 16:40      Impression / Plan:   Assessment: Principal Problem:   Symptomatic anemia Active Problems:   Hyperlipidemia   Peripheral vascular disease (HCC)   Iron deficiency anemia   GI bleeding   Chronic kidney disease, stage 3b (HCC)   Hypokalemia   HTN (hypertension)   CAD (coronary artery disease)   Bryan Welch is a 87 y.o. 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  The patient had been on anticoagulation and came back with significant anemia.  The patient had an EGD and colonoscopy last month without any active bleeding seen.  The patient was following with hematology for iron infusions.  He is now getting transfused due to his low hemoglobin.  Plan:  The patient has had an upper endoscopy and colonoscopy at his previous admission 1 month ago.  The patient will have a capsule endoscopy placed today so that we can evaluate the small bowel for a possible source of GI bleeding.  The patient has been explained the plan and agrees with it.  Thank you for involving me in the care of this patient.      LOS: 0 days   Midge Minium, MD, [facility] 05/09/2023, 6:59 AM,  Pager [phone] 7am-5pm  Check AMION for 5pm -7am coverage and on weekends   Note: This dictation was prepared with Dragon dictation along with smaller phrase technology. Any transcriptional errors that result from this process are unintentional.

## 2023-05-09 NOTE — Hospital Course (Signed)
Bryan Welch is a 87 y.o. male with medical history significant of AAA, ulcer of aorta, carotid artery stenosis, peripheral vascular disease on Xarelto, HTN, HLD, CAD, anixety, CKD-3b, iron deficiency anemia on IV iron, who presents with weakness, dizziness and shortness of breath. He took last dose of ASA and Xarelto in morning 05/08/23. Hx iron deficiency, on IV iron weekly x5 doses started 06/14.  Of note, he underwent an EGD and colonoscopy by Dr. Timothy Lasso last month. At that time the patient had the findings of a large ileocecal valve polyp with a 11 to 12 mm polyp in the ascending colon with a smaller polyp in the cecum and ascending colon. 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 There was no sign of active bleeding on Dr. Ferd Hibbs procedures.   07/01: Hgb 8.8 on 5/16 --> 5.9 in the ED. CTA of abdomen/pelvis showed colonic diverticulosis, but no active GI bleeding.  Dr. Servando Snare of GI was consulted.  07/02: capsule endoscopy multiple sites of bleeding, largest sire in proximal small bowel. Dr Servando Snare planning EGD tomorrow morning.   Consultants:  Gastroenterology   Procedures: Capsule endoscopy 05/09/23       ASSESSMENT & PLAN:   Principal Problem:   Symptomatic anemia Active Problems:   GI bleeding   Iron deficiency anemia   Hyperlipidemia   HTN (hypertension)   CAD (coronary artery disease)   Chronic kidney disease, stage 3b (HCC)   Hypokalemia   Peripheral vascular disease (HCC)  Symptomatic anemia due to upper small intestinal GI bleeding, complicated by underlying iron deficiency anemia:  Hgb 8.7 --> 5.9  S/p 2 units PRBC and dose IV Venofer  Plan EGD tomorrow w/ Dr Servando Snare to attempt achieve hemostasis  NPO p mn IV pantoprazole 40 mg bid Zofran IV for nausea Avoid NSAIDs and SQ heparin Maintain IV access (2 large bore IVs if possible). Monitor closely and follow q6h cbc, transfuse as necessary, if Hgb<7.0 Hold Xarelto and ASA    Hyperlipidemia Pravastatin   HTN (hypertension) Hold HCTZ since patient need IV fluid IV hydralazine as needed   CAD (coronary artery disease) Hold aspirin Pravastatin   Chronic kidney disease, stage 3b (HCC):  Stable.  Recent baseline creatinine 1.88 on 03/23/2023.   His creatinine is 1.63, BUN 30, GFR 41 Follow-up with BMP   Hypokalemia:  Potassium 3.4 Repleted potassium Check magnesium level   History of Peripheral vascular disease (HCC):  stent placed to both legs and stent placement to right carotid artery.  Patient also has AAA and ulcer of aorta. Pt is following up with Dr. Wyn Quaker of [facility]. Continue pravastatin Hold aspirin and Xarelto as above      DVT prophylaxis: SCD Pertinent IV fluids/nutrition: none at this time Central lines / invasive devices: none  Code Status: FULL CODE ACP documentation reviewed: none on file   Current Admission Status: observation, may need to be inpatient will contact utilization review   TOC needs / Dispo plan: TBD pending clinical course  Barriers to discharge / significant pending items: EGD tomorrow, if unable to achieve hemostasis may need further procedures

## 2023-05-10 ENCOUNTER — Observation Stay: Payer: Medicare Other | Admitting: Anesthesiology

## 2023-05-10 ENCOUNTER — Encounter
Admission: EM | Disposition: A | Discharge status: Home Health | Payer: Self-pay | Source: Home / Self Care | Attending: Student

## 2023-05-10 ENCOUNTER — Encounter: Payer: Self-pay | Admitting: Internal Medicine

## 2023-05-10 DIAGNOSIS — R933 Abnormal findings on diagnostic imaging of other parts of digestive tract: Secondary | ICD-10-CM

## 2023-05-10 DIAGNOSIS — K922 Gastrointestinal hemorrhage, unspecified: Secondary | ICD-10-CM | POA: Diagnosis present

## 2023-05-10 DIAGNOSIS — Z885 Allergy status to narcotic agent status: Secondary | ICD-10-CM | POA: Diagnosis not present

## 2023-05-10 DIAGNOSIS — I2510 Atherosclerotic heart disease of native coronary artery without angina pectoris: Secondary | ICD-10-CM | POA: Diagnosis present

## 2023-05-10 DIAGNOSIS — Z7901 Long term (current) use of anticoagulants: Secondary | ICD-10-CM | POA: Diagnosis not present

## 2023-05-10 DIAGNOSIS — K31811 Angiodysplasia of stomach and duodenum with bleeding: Secondary | ICD-10-CM | POA: Diagnosis present

## 2023-05-10 DIAGNOSIS — I7143 Infrarenal abdominal aortic aneurysm, without rupture: Secondary | ICD-10-CM | POA: Diagnosis present

## 2023-05-10 DIAGNOSIS — D649 Anemia, unspecified: Secondary | ICD-10-CM

## 2023-05-10 DIAGNOSIS — N1832 Chronic kidney disease, stage 3b: Secondary | ICD-10-CM | POA: Diagnosis present

## 2023-05-10 DIAGNOSIS — Z7982 Long term (current) use of aspirin: Secondary | ICD-10-CM | POA: Diagnosis not present

## 2023-05-10 DIAGNOSIS — Z79899 Other long term (current) drug therapy: Secondary | ICD-10-CM | POA: Diagnosis not present

## 2023-05-10 DIAGNOSIS — R42 Dizziness and giddiness: Secondary | ICD-10-CM | POA: Diagnosis present

## 2023-05-10 DIAGNOSIS — I129 Hypertensive chronic kidney disease with stage 1 through stage 4 chronic kidney disease, or unspecified chronic kidney disease: Secondary | ICD-10-CM | POA: Diagnosis present

## 2023-05-10 DIAGNOSIS — E876 Hypokalemia: Secondary | ICD-10-CM | POA: Diagnosis present

## 2023-05-10 DIAGNOSIS — Z833 Family history of diabetes mellitus: Secondary | ICD-10-CM | POA: Diagnosis not present

## 2023-05-10 DIAGNOSIS — Z8052 Family history of malignant neoplasm of bladder: Secondary | ICD-10-CM | POA: Diagnosis not present

## 2023-05-10 DIAGNOSIS — I739 Peripheral vascular disease, unspecified: Secondary | ICD-10-CM | POA: Diagnosis present

## 2023-05-10 DIAGNOSIS — K2970 Gastritis, unspecified, without bleeding: Secondary | ICD-10-CM | POA: Diagnosis present

## 2023-05-10 DIAGNOSIS — Z89421 Acquired absence of other right toe(s): Secondary | ICD-10-CM | POA: Diagnosis not present

## 2023-05-10 DIAGNOSIS — Z96611 Presence of right artificial shoulder joint: Secondary | ICD-10-CM | POA: Diagnosis present

## 2023-05-10 DIAGNOSIS — E785 Hyperlipidemia, unspecified: Secondary | ICD-10-CM | POA: Diagnosis present

## 2023-05-10 DIAGNOSIS — Z8042 Family history of malignant neoplasm of prostate: Secondary | ICD-10-CM | POA: Diagnosis not present

## 2023-05-10 DIAGNOSIS — D509 Iron deficiency anemia, unspecified: Secondary | ICD-10-CM | POA: Diagnosis present

## 2023-05-10 DIAGNOSIS — I700 Atherosclerosis of aorta: Secondary | ICD-10-CM | POA: Diagnosis present

## 2023-05-10 DIAGNOSIS — Z87891 Personal history of nicotine dependence: Secondary | ICD-10-CM | POA: Diagnosis not present

## 2023-05-10 HISTORY — PX: ENTEROSCOPY: SHX5533

## 2023-05-10 HISTORY — DX: Angiodysplasia of stomach and duodenum with bleeding: K31.811

## 2023-05-10 HISTORY — PX: HOT HEMOSTASIS: SHX5433

## 2023-05-10 LAB — BPAM RBC
ISSUE DATE / TIME: 202407011711
ISSUE DATE / TIME: 202407020409
Unit Type and Rh: 5100
Unit Type and Rh: 9500

## 2023-05-10 LAB — BASIC METABOLIC PANEL
Anion gap: 9 (ref 5–15)
BUN: 20 mg/dL (ref 8–23)
CO2: 21 mmol/L — ABNORMAL LOW (ref 22–32)
Calcium: 8.1 mg/dL — ABNORMAL LOW (ref 8.9–10.3)
Chloride: 107 mmol/L (ref 98–111)
Creatinine, Ser: 1.49 mg/dL — ABNORMAL HIGH (ref 0.61–1.24)
GFR, Estimated: 46 mL/min — ABNORMAL LOW (ref >60)
Glucose, Bld: 108 mg/dL — ABNORMAL HIGH (ref 70–99)
Potassium: 4.3 mmol/L (ref 3.5–5.1)
Sodium: 137 mmol/L (ref 135–145)

## 2023-05-10 LAB — TYPE AND SCREEN
ABO/RH(D): O POS
Antibody Screen: NEGATIVE
Unit division: 0
Unit division: 0

## 2023-05-10 LAB — HEMOGLOBIN AND HEMATOCRIT, BLOOD
HCT: 27.8 % — ABNORMAL LOW (ref 39.0–52.0)
Hemoglobin: 8.3 g/dL — ABNORMAL LOW (ref 13.0–17.0)

## 2023-05-10 LAB — VITAMIN D 25 HYDROXY (VIT D DEFICIENCY, FRACTURES): Vit D, 25-Hydroxy: 49.56 ng/mL (ref 30–100)

## 2023-05-10 LAB — MAGNESIUM: Magnesium: 1.6 mg/dL — ABNORMAL LOW (ref 1.7–2.4)

## 2023-05-10 LAB — PHOSPHORUS: Phosphorus: 2.9 mg/dL (ref 2.5–4.6)

## 2023-05-10 SURGERY — ENTEROSCOPY
Anesthesia: General

## 2023-05-10 SURGERY — ESOPHAGOGASTRODUODENOSCOPY (EGD) WITH PROPOFOL
Anesthesia: General

## 2023-05-10 MED ORDER — EPHEDRINE 5 MG/ML INJ
INTRAVENOUS | Status: AC
  Filled 2023-05-10: qty 5

## 2023-05-10 MED ORDER — VITAMIN B-12 1000 MCG PO TABS
1000.0000 ug | ORAL_TABLET | Freq: Every day | ORAL | Status: DC
  Administered 2023-05-10 – 2023-05-12 (×3): 1000 ug via ORAL
  Filled 2023-05-10 (×3): qty 1

## 2023-05-10 MED ORDER — MAGNESIUM SULFATE 2 GM/50ML IV SOLN
2.0000 g | Freq: Once | INTRAVENOUS | Status: AC
  Administered 2023-05-10: 2 g via INTRAVENOUS
  Filled 2023-05-10: qty 50

## 2023-05-10 MED ORDER — LIDOCAINE HCL (PF) 2 % IJ SOLN
INTRAMUSCULAR | Status: AC
  Filled 2023-05-10: qty 10

## 2023-05-10 MED ORDER — EPHEDRINE SULFATE (PRESSORS) 50 MG/ML IJ SOLN
INTRAMUSCULAR | Status: DC | PRN
  Administered 2023-05-10: 5 mg via INTRAVENOUS

## 2023-05-10 MED ORDER — GLYCOPYRROLATE 0.2 MG/ML IJ SOLN
INTRAMUSCULAR | Status: AC
  Filled 2023-05-10: qty 1

## 2023-05-10 MED ORDER — PROPOFOL 10 MG/ML IV BOLUS
INTRAVENOUS | Status: DC | PRN
  Administered 2023-05-10: 50 mg via INTRAVENOUS

## 2023-05-10 MED ORDER — SODIUM CHLORIDE 0.9 % IV SOLN
200.0000 mg | Freq: Every day | INTRAVENOUS | Status: DC
  Administered 2023-05-10 – 2023-05-12 (×3): 200 mg via INTRAVENOUS
  Filled 2023-05-10 (×3): qty 200

## 2023-05-10 MED ORDER — PROPOFOL 500 MG/50ML IV EMUL
INTRAVENOUS | Status: DC | PRN
  Administered 2023-05-10: 187.023 ug/kg/min via INTRAVENOUS

## 2023-05-10 MED ORDER — PHENYLEPHRINE 80 MCG/ML (10ML) SYRINGE FOR IV PUSH (FOR BLOOD PRESSURE SUPPORT)
PREFILLED_SYRINGE | INTRAVENOUS | Status: AC
  Filled 2023-05-10: qty 10

## 2023-05-10 MED ORDER — LIDOCAINE HCL (CARDIAC) PF 100 MG/5ML IV SOSY
PREFILLED_SYRINGE | INTRAVENOUS | Status: DC | PRN
  Administered 2023-05-10: 80 mg via INTRAVENOUS

## 2023-05-10 MED ORDER — LIDOCAINE HCL (PF) 2 % IJ SOLN
INTRAMUSCULAR | Status: AC
  Filled 2023-05-10: qty 5

## 2023-05-10 MED ORDER — PROPOFOL 1000 MG/100ML IV EMUL
INTRAVENOUS | Status: AC
  Filled 2023-05-10: qty 200

## 2023-05-10 MED ORDER — DEXMEDETOMIDINE HCL IN NACL 80 MCG/20ML IV SOLN
INTRAVENOUS | Status: AC
  Filled 2023-05-10: qty 20

## 2023-05-10 NOTE — Progress Notes (Signed)
[facility] Progress Note  [name]    NUU:[id number]  DOA: 05/08/2023     Date of Service: the patient was seen and examined on 05/10/2023  Chief Complaint  Patient presents with   Dizziness   Brief hospital course: Bryan Welch is a 87 y.o. male with medical history significant of AAA, ulcer of aorta, carotid artery stenosis, peripheral vascular disease on Xarelto, HTN, HLD, CAD, anixety, CKD-3b, iron deficiency anemia on IV iron, who presents with weakness, dizziness and shortness of breath. He took last dose of ASA and Xarelto in morning 05/08/23. Hx iron deficiency, on IV iron weekly x5 doses started 06/14.  Of note, he underwent an EGD and colonoscopy by Dr. Timothy Welch last month. At that time the patient had the findings of a large ileocecal valve polyp with a 11 to 12 mm polyp in the ascending colon with a smaller polyp in the cecum and ascending colon. 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 There was no sign of active bleeding on Dr. Ferd Welch procedures.   07/01: Hgb 8.8 on 5/16 --> 5.9 in the ED. CTA of abdomen/pelvis showed colonic diverticulosis, but no active GI bleeding.  Dr. Servando Welch of GI was consulted.  07/02: capsule endoscopy multiple sites of bleeding, largest sire in proximal small bowel. Dr Bryan Welch planning EGD on 7/3    Assessment and Plan: Symptomatic anemia due to upper small intestinal GI bleeding, complicated by underlying iron deficiency anemia:  Hgb 8.7 --> 5.9  S/p 2 units PRBC and IV Venofer  Continue IV pantoprazole 40 mg bid Zofran IV for nausea Avoid NSAIDs and SQ heparin Maintain IV access (2 large bore IVs if possible). 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  As per GI patient remains at high risk for oral anticoagulation.   Iron deficiency, transferrin  saturation 5% Venofer 200 mg IV daily for 5 days followed by oral supplement Vitamin B12 level 380, goal >400, started oral supplement.   Hyperlipidemia Pravastatin   HTN (hypertension) Hold HCTZ since patient need IV fluid IV hydralazine as needed   CAD (coronary artery disease) Hold aspirin Pravastatin   Chronic kidney disease, stage 3b (HCC):  Stable.  Recent baseline creatinine 1.88 on 03/23/2023.   His creatinine is 1.63-1.49 Follow-up with BMP   Hypokalemia:  Potassium repleted, Potassium 3.4--4.3 Magnesium, mag repleted. Monitor electrolytes and replete as needed.    History of Peripheral vascular disease (HCC):  stent placed to both legs and stent placement to right carotid artery.  Patient also has AAA and ulcer of aorta. Pt is following up with Bryan Welch of [facility]. Continue pravastatin Hold aspirin and Xarelto as above 7/3 high risk for DOAC as per GI, we will get an opinion from vascular surgery.   Body mass index is 23.31 kg/m.  Interventions:  Diet: Full liquid diet DVT Prophylaxis: SCD, pharmacological prophylaxis contraindicated due to GI bleeding    Advance goals of care discussion: Full code  Family Communication: family was not present at bedside, at the time of interview.  The pt provided permission to discuss medical plan with the family. Opportunity was given to ask question and all questions were answered satisfactorily.   Disposition:  Pt is from Home, admitted with GI bleeidng, still has risk of bleeding, which precludes a safe discharge. Discharge to Home, when  cleared by GI and stable.  Subjective: No significant events overnight, patient was seen after small bowel enteroscopy.  Patient tolerated procedure well, denied any abdominal pain.  No chest pain or palpitation, no shortness of breath.  Physical Exam: General: NAD, lying comfortably Appear in no distress, affect appropriate Eyes: PERRLA ENT: Oral Mucosa Clear, moist  Neck: no JVD,   Cardiovascular: S1 and S2 Present, no Murmur,  Respiratory: good respiratory effort, Bilateral Air entry equal and Decreased, no Crackles, no wheezes Abdomen: Bowel Sound present, Soft and no tenderness,  Skin: no rashes Extremities: no Pedal edema, no calf tenderness Neurologic: without any new focal findings Gait not checked due to patient safety concerns  Vitals:   05/10/23 0800 05/10/23 0807 05/10/23 0826 05/10/23 0922  BP: (!) 104/47 (!) 102/49 (!) 119/51 90/70  Pulse: 73 70 72 73  Resp: 13 14 18 18   Temp: (!) 96.3 F (35.7 C)   98.1 F (36.7 C)  TempSrc: Temporal   Oral  SpO2: 91% 99% 95% 99%  Weight:      Height:        Intake/Output Summary (Last 24 hours) at 05/10/2023 1414 Last data filed at 05/10/2023 1340 Gross per 24 hour  Intake 858.8 ml  Output 200 ml  Net 658.8 ml   Filed Weights   05/08/23 1438 05/08/23 2330  Weight: 66 kg 65.5 kg    Data Reviewed: I have personally reviewed and interpreted daily labs, tele strips, imagings as discussed above. I reviewed all nursing notes, pharmacy notes, vitals, pertinent old records I have discussed plan of care as described above with RN and patient/family.  CBC: Recent Labs  Lab 05/08/23 2137 05/09/23 0122 05/09/23 0711 05/09/23 1312 05/09/23 1933  WBC 7.2 8.0 6.8 6.6 6.9  HGB 7.0* 6.4* 7.8* 7.6* 7.9*  HCT 23.4* 21.1* 25.6* 25.3* 25.9*  MCV 84.2 84.1 85.6 85.8 85.2  PLT 292 279 272 267 285   Basic Metabolic Panel: Recent Labs  Lab 05/08/23 1508 05/08/23 1510 05/09/23 0122 05/10/23 0959  NA  --  138 139 137  K  --  3.4* 3.7 4.3  CL  --  107 109 107  CO2  --  21* 21* 21*  GLUCOSE  --  126* 91 108*  BUN  --  30* 26* 20  CREATININE  --  1.63* 1.39* 1.49*  CALCIUM  --  8.2* 7.9* 8.1*  MG 1.8  --   --  1.6*  PHOS  --   --   --  2.9    Studies: No results found.  Scheduled Meds:  ALPRAZolam  0.25 mg Oral QHS   cholecalciferol  2,000 Units Oral Daily   vitamin B-12  1,000 mcg Oral Daily    diphenhydrAMINE  25 mg Oral QHS   gabapentin  300 mg Oral BID   multivitamin  1 tablet Oral BID   pantoprazole (PROTONIX) IV  40 mg Intravenous Q12H   pravastatin  20 mg Oral Daily   tamsulosin  0.4 mg Oral Daily   Continuous Infusions:  sodium chloride     iron sucrose 200 mg (05/10/23 1055)   PRN Meds: acetaminophen, hydrALAZINE, ondansetron (ZOFRAN) IV  Time spent: 35 minutes  Author: Gillis Santa. MD [facility] 05/10/2023 2:14 PM  To reach On-call, see care teams to locate the attending and reach out to them via [email or web address]. If 7PM-7AM, please contact night-coverage If you still have difficulty reaching the attending provider, please page the [facility] (Director on Call) for  [facility] on amion for assistance.

## 2023-05-10 NOTE — Op Note (Signed)
[facility] Gastroenterology Patient Name: Bryan Welch Procedure Date: 05/10/2023 7:11 AM MRN: [id number] Account #: [id number] Date of Birth: August 01, 1936 Admit Type: Inpatient Age: 87 Room: [facility] ENDO ROOM 1 Gender: Male Note Status: Finalized Instrument Name: Peds Colonoscope [id number] Procedure:             Small bowel enteroscopy Indications:           Abnormal video capsule endoscopy Providers:             Midge Minium MD, MD Referring MD:          Gracelyn Nurse, MD (Referring MD) Medicines:             Propofol per Anesthesia Complications:         No immediate complications. Procedure:             Pre-Anesthesia Assessment:                        - Prior to the procedure, a History and Physical was                         performed, and patient medications and allergies were                         reviewed. [name] tolerance of previous                         anesthesia was also reviewed. The risks and benefits                         of the procedure and the sedation options and risks                         were discussed with the patient. All questions were                         answered, and informed consent was obtained. Prior                         Anticoagulants: The patient has taken anticoagulant                         medication, last dose was 4 days prior to procedure.                         ASA Grade Assessment: II - A patient with mild                         systemic disease. After reviewing the risks and                         benefits, the patient was deemed in satisfactory                         condition to undergo the procedure.                        After obtaining informed consent, the endoscope was  passed under direct vision. Throughout the procedure,                         the patient's blood pressure, pulse, and oxygen                         saturations were monitored continuously. The                          Colonoscope was introduced through the mouth and                         advanced to the jejunum, to the 120 cm mark (from the                         incisors). The small bowel enteroscopy was                         accomplished without difficulty. The patient tolerated                         the procedure well. Findings:      The esophagus was normal.      Moderate inflammation characterized by erosions was found in the gastric       antrum.      A few angiodysplastic lesions with stigmata of recent bleeding were       found in the proximal jejunum. Coagulation for tissue destruction using       argon plasma at 2 liters/minute and 20 watts was successful.      A few angiodysplastic lesions with stigmata of recent bleeding were       found in the third portion of the duodenum. Coagulation for hemostasis       using argon plasma at 2 liters/minute and 20 watts was successful. Impression:            - Normal esophagus.                        - Gastritis.                        - A few recently bleeding angiodysplastic lesions in                         the duodenum. Treated with argon plasma coagulation                         (APC).                        - A few recently bleeding angiodysplastic lesions in                         the duodenum. Treated with argon plasma coagulation                         (APC).                        - No specimens collected. Recommendation:        - Full  liquid diet.                        - Return patient to hospital ward.                        - - The patient had other sites of bleeding on the                         capsule that could not be reached via upper endscopy.                        - Further use of anticoagulation has a high risk of                         further bleeding.                        - If any further bleeding then consider double balloon                         enteroscopy at [facility] or [facility]. Procedure  Code(s):     --- Professional ---                        [phone], Small intestinal endoscopy, enteroscopy beyond                         second portion of duodenum, not including ileum; with                         control of bleeding (eg, injection, bipolar cautery,                         unipolar cautery, laser, heater probe, stapler, plasma                         coagulator) Diagnosis Code(s):     --- Professional ---                        R93.3, Abnormal findings on diagnostic imaging of                         other parts of digestive tract                        K31.811, Angiodysplasia of stomach and duodenum with                         bleeding CPT copyright 2022 American [employer] Association. All rights reserved. The codes documented in this report are preliminary and upon coder review may  be revised to meet current compliance requirements. Midge Minium MD, MD 05/10/2023 8:09:25 AM This report has been signed electronically. Number of Addenda: 0 Note Initiated On: 05/10/2023 7:11 AM Estimated Blood Loss:  Estimated blood loss: none.      [facility]

## 2023-05-10 NOTE — Progress Notes (Signed)
Mobility Specialist - Progress Note   05/10/23 1328  Mobility  Activity Ambulated with assistance in hallway;Stood at bedside;Dangled on edge of bed  Level of Assistance Standby assist, set-up cues, supervision of patient - no hands on  Assistive Device None  Distance Ambulated (ft) 200 ft  Activity Response Tolerated well  Mobility Referral Yes  $Mobility charge 1 Mobility  Mobility Specialist Start Time (ACUTE ONLY) 1252  Mobility Specialist Stop Time (ACUTE ONLY) 1306  Mobility Specialist Time Calculation (min) (ACUTE ONLY) 14 min   Pt supine in bed on RA upon arrival. Pt completes bed mobility and dons/doffs shoes indep. Pt STS and ambulates in hallway SBA with no LOB noted. Pt returns to bed with needs in reach.  Terrilyn Saver  Mobility Specialist  05/10/23 1:30 PM

## 2023-05-10 NOTE — Transfer of Care (Signed)
Immediate Anesthesia Transfer of Care Note  [name]  Procedure(s) Performed: ENTEROSCOPY HOT HEMOSTASIS (ARGON PLASMA COAGULATION/BICAP)  Patient Location: Endoscopy Unit  Anesthesia Type:General  Level of Consciousness: drowsy  Airway & Oxygen Therapy: Patient Spontanous Breathing and Patient connected to nasal cannula oxygen  Post-op Assessment: Report given to RN and Post -op Vital signs reviewed and stable  Post vital signs: Reviewed and stable  Last Vitals:  Vitals Value Taken Time  BP 104/47 05/10/23 0800  Temp    Pulse 73 05/10/23 0800  Resp 13 05/10/23 0800  SpO2 90 % 05/10/23 0800  Vitals shown include unvalidated device data.  Last Pain:  Vitals:   05/10/23 0717  TempSrc:   PainSc: 0-No pain         Complications: No notable events documented.

## 2023-05-10 NOTE — Anesthesia Preprocedure Evaluation (Addendum)
Anesthesia Evaluation  Patient identified by MRN, date of birth, ID band Patient awake    Reviewed: Allergy & Precautions, NPO status , Patient's Chart, lab work & pertinent test results  History of Anesthesia Complications Negative for: history of anesthetic complications  Airway Mallampati: IV   Neck ROM: Full    Dental  (+) Upper Dentures, Lower Dentures   Pulmonary former smoker (quit 2001)   Pulmonary exam normal breath sounds clear to auscultation       Cardiovascular hypertension, + CAD and + Peripheral Vascular Disease (s/p CEA, LE stents on Xarelto; AAA)  Normal cardiovascular exam Rhythm:Regular Rate:Normal  ECG 05/08/23: NSR   Neuro/Psych negative neurological ROS     GI/Hepatic hiatal hernia,,,  Endo/Other  negative endocrine ROS    Renal/GU Renal disease (stage III CKD)     Musculoskeletal  (+) Arthritis ,    Abdominal   Peds  Hematology  (+) Blood dyscrasia, anemia   Anesthesia Other Findings   Reproductive/Obstetrics                             Anesthesia Physical Anesthesia Plan  ASA: 3  Anesthesia Plan: General   Post-op Pain Management:    Induction: Intravenous  PONV Risk Score and Plan: 2 and Propofol infusion, TIVA and Treatment may vary due to age or medical condition  Airway Management Planned: Natural Airway  Additional Equipment:   Intra-op Plan:   Post-operative Plan:   Informed Consent: I have reviewed the patients History and Physical, chart, labs and discussed the procedure including the risks, benefits and alternatives for the proposed anesthesia with the patient or authorized representative who has indicated his/her understanding and acceptance.       Plan Discussed with: CRNA  Anesthesia Plan Comments: (LMA/GETA backup discussed.  Patient consented for risks of anesthesia including but not limited to:  - adverse reactions to  medications - damage to eyes, teeth, lips or other oral mucosa - nerve damage due to positioning  - sore throat or hoarseness - damage to heart, brain, nerves, lungs, other parts of body or loss of life  Informed patient about role of CRNA in peri- and intra-operative care.  Patient voiced understanding.)        Anesthesia Quick Evaluation

## 2023-05-10 NOTE — Anesthesia Postprocedure Evaluation (Signed)
Anesthesia Post Note  [name]  Procedure(s) Performed: ENTEROSCOPY HOT HEMOSTASIS (ARGON PLASMA COAGULATION/BICAP)  Patient location during evaluation: PACU Anesthesia Type: General Level of consciousness: awake and alert, oriented and patient cooperative Pain management: pain level controlled Vital Signs Assessment: post-procedure vital signs reviewed and stable Respiratory status: spontaneous breathing, nonlabored ventilation and respiratory function stable Cardiovascular status: blood pressure returned to baseline and stable Postop Assessment: adequate PO intake Anesthetic complications: no   No notable events documented.   Last Vitals:  Vitals:   05/10/23 0807 05/10/23 0826  BP: (!) 102/49 (!) 119/51  Pulse: 70 72  Resp: 14 18  Temp:    SpO2: 99% 95%    Last Pain:  Vitals:   05/10/23 0826  TempSrc:   PainSc: 0-No pain                 Reed Breech

## 2023-05-11 LAB — CBC
HCT: 28 % — ABNORMAL LOW (ref 39.0–52.0)
Hemoglobin: 8.3 g/dL — ABNORMAL LOW (ref 13.0–17.0)
MCH: 25.7 pg — ABNORMAL LOW (ref 26.0–34.0)
MCHC: 29.6 g/dL — ABNORMAL LOW (ref 30.0–36.0)
MCV: 86.7 fL (ref 80.0–100.0)
Platelets: 283 10*3/uL (ref 150–400)
RBC: 3.23 MIL/uL — ABNORMAL LOW (ref 4.22–5.81)
RDW: 21 % — ABNORMAL HIGH (ref 11.5–15.5)
WBC: 7.1 10*3/uL (ref 4.0–10.5)
nRBC: 0 % (ref 0.0–0.2)

## 2023-05-11 LAB — GLUCOSE, CAPILLARY: Glucose-Capillary: 96 mg/dL (ref 70–99)

## 2023-05-11 LAB — BASIC METABOLIC PANEL
Anion gap: 8 (ref 5–15)
BUN: 18 mg/dL (ref 8–23)
CO2: 21 mmol/L — ABNORMAL LOW (ref 22–32)
Calcium: 8.1 mg/dL — ABNORMAL LOW (ref 8.9–10.3)
Chloride: 107 mmol/L (ref 98–111)
Creatinine, Ser: 1.52 mg/dL — ABNORMAL HIGH (ref 0.61–1.24)
GFR, Estimated: 45 mL/min — ABNORMAL LOW (ref >60)
Glucose, Bld: 95 mg/dL (ref 70–99)
Potassium: 3.6 mmol/L (ref 3.5–5.1)
Sodium: 136 mmol/L (ref 135–145)

## 2023-05-11 LAB — MAGNESIUM: Magnesium: 2.2 mg/dL (ref 1.7–2.4)

## 2023-05-11 LAB — PHOSPHORUS: Phosphorus: 3.6 mg/dL (ref 2.5–4.6)

## 2023-05-11 NOTE — TOC Initial Note (Signed)
Transition of Care [facility]) - Initial/Assessment Note    Patient Details  Name: Bryan Welch MRN: [id number] Date of Birth: 01/19/1936  [facility]) CM/SW Contact:    Allena Katz, LCSW Phone Number: 05/11/2023, 12:25 PM  Clinical Narrative:      Pt admitted witth GI hemorrhage. Pt lives at home with wife. Pt active with dr Laural Benes for primary care. Pt had EGD on 7/4. Pt reports no transportation needs or SDOH needs at this time. Pt had [facility] in the past. PT/OT to be ordered if needed to reassess if home health is needed again.               Expected Discharge Plan: Home w Home Health Services Barriers to Discharge: Barriers Resolved   Patient Goals and CMS Choice            Expected Discharge Plan and Services                                              Prior Living Arrangements/Services   Lives with:: Spouse Patient language and need for interpreter reviewed:: Yes Do you feel safe going back to the place where you live?: Yes      Need for Family Participation in Patient Care: No (Comment) Care giver support system in place?: Yes (comment)      Activities of Daily Living Home Assistive Devices/Equipment: Cane (specify quad or straight), Eyeglasses ADL Screening (condition at time of admission) Patient's cognitive ability adequate to safely complete daily activities?: Yes Is the patient deaf or have difficulty hearing?: No Does the patient have difficulty seeing, even when wearing glasses/contacts?: No Does the patient have difficulty concentrating, remembering, or making decisions?: No Patient able to express need for assistance with ADLs?: Yes Does the patient have difficulty dressing or bathing?: No Independently performs ADLs?: Yes (appropriate for developmental age) Does the patient have difficulty walking or climbing stairs?: No Weakness of Legs: None Weakness of Arms/Hands: None  Permission Sought/Granted                   Emotional Assessment       Orientation: : Oriented to Self, Oriented to Place, Oriented to  Time, Oriented to Situation      Admission diagnosis:  Dizziness [R42] GI bleeding [K92.2] Anemia, unspecified type [D64.9] Acute GI bleeding [K92.2] Patient Active Problem List   Diagnosis Date Noted   Gastrointestinal tract imaging abnormality 05/10/2023   Upper gastrointestinal hemorrhage due to angiodysplasia 05/10/2023   Anemia 05/10/2023   Acute GI bleeding 05/10/2023   GI bleeding 05/08/2023   Chronic kidney disease, stage 3b (HCC) 05/08/2023   Hypokalemia 05/08/2023   HTN (hypertension) 05/08/2023   CAD (coronary artery disease) 05/08/2023   Iron deficiency anemia 04/21/2023   B12 deficiency 04/21/2023   Symptomatic anemia 03/22/2023   Ulcer of aorta (HCC) 03/22/2023   CKD stage 3a, GFR 45-59 ml/min (HCC) 03/22/2023   Atherosclerosis of native arteries of the extremities with ulceration (HCC) 09/28/2021   Atherosclerosis of native arteries of extremity with intermittent claudication (HCC) 08/10/2021   Olecranon bursitis, right elbow 05/25/2020   UPJ (ureteropelvic junction) obstruction 09/13/2018   CRF (chronic renal failure), stage 3 (moderate) 07/25/2018   Limb ischemia 09/20/2017   Peripheral vascular disease (HCC) 04/05/2017   AAA (abdominal aortic aneurysm) without rupture (HCC) 04/05/2017   Hyperlipidemia 11/15/2016  Benign essential hypertension 11/15/2016   Carotid stenosis 11/15/2016   PCP:  Gracelyn Nurse, MD Pharmacy:   [facility] [address] [place] - [address] [place] [address] Phone: [phone] Fax: [phone]     Social Determinants of Health (SDOH) Social History: SDOH Screenings   Food Insecurity: No Food Insecurity (05/08/2023)  Housing: Low Risk  (05/08/2023)  Transportation Needs: No Transportation Needs (05/08/2023)  Utilities: Not At Risk (05/08/2023)  Depression (PHQ2-9): Low Risk  (04/21/2023)  Tobacco  Use: Medium Risk (05/10/2023)   SDOH Interventions:     Readmission Risk Interventions    05/11/2023   12:24 PM  Readmission Risk Prevention Plan  Transportation Screening Complete  PCP or Specialist Appt within 5-7 Days Complete  Home Care Screening Complete  Medication Review (RN CM) Complete

## 2023-05-11 NOTE — Progress Notes (Signed)
[facility] Progress Note  [name]    ZOX:[id number]  DOA: 05/08/2023     Date of Service: the patient was seen and examined on 05/11/2023  Chief Complaint  Patient presents with   Dizziness   Brief hospital course: Bryan Welch is a 87 y.o. male with medical history significant of AAA, ulcer of aorta, carotid artery stenosis, peripheral vascular disease on Xarelto, HTN, HLD, CAD, anixety, CKD-3b, iron deficiency anemia on IV iron, who presents with weakness, dizziness and shortness of breath. He took last dose of ASA and Xarelto in morning 05/08/23. Hx iron deficiency, on IV iron weekly x5 doses started 06/14.  Of note, he underwent an EGD and colonoscopy by Dr. Timothy Lasso last month. At that time the patient had the findings of a large ileocecal valve polyp with a 11 to 12 mm polyp in the ascending colon with a smaller polyp in the cecum and ascending colon. 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 There was no sign of active bleeding on Dr. Ferd Hibbs procedures.   07/01: Hgb 8.8 on 5/16 --> 5.9 in the ED. CTA of abdomen/pelvis showed colonic diverticulosis, but no active GI bleeding.  Dr. Servando Snare of GI was consulted.  07/02: capsule endoscopy multiple sites of bleeding, largest sire in proximal small bowel. Dr Servando Snare planning EGD on 7/3    Assessment and Plan: Symptomatic anemia due to upper small intestinal GI bleeding, complicated by underlying iron deficiency anemia:  Hgb 8.7 --> 5.9  S/p 2 units PRBC and IV Venofer  Continue IV pantoprazole 40 mg bid Zofran IV for nausea Avoid NSAIDs and SQ heparin Maintain IV access (2 large bore IVs if possible). 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  As per GI patient remains at high risk for oral anticoagulation. Hb 8.3 stable, patient denies any  bleeding Patient is afraid if he will not take anticoagulation then he will developed blood clot in his legs.  Plan is to start heparin IV infusion tomorrow a.m. for 1 to 2 days and monitor H&H, any signs of bleeding.  If patient tolerates heparin then we can resume DOAC.  Iron deficiency, transferrin saturation 5% Venofer 200 mg IV daily for 5 days followed by oral supplement Vitamin B12 level 380, goal >400, started oral supplement.   Hyperlipidemia Pravastatin   HTN (hypertension) Hold HCTZ since patient need IV fluid IV hydralazine as needed   CAD (coronary artery disease) Hold aspirin Pravastatin   Chronic kidney disease, stage 3b (HCC):  Stable.  Recent baseline creatinine 1.88 on 03/23/2023.   His creatinine is 1.63-1.49 Follow-up with BMP   Hypokalemia:  Potassium repleted, Potassium 3.4--4.3 Magnesium, mag repleted. Monitor electrolytes and replete as needed.    History of Peripheral vascular disease (HCC):  stent placed to both legs and stent placement to right carotid artery.  Patient also has AAA and ulcer of aorta. Pt is following up with Dr. Wyn Quaker of [facility]. Continue pravastatin Hold aspirin and Xarelto as above 7/3 high risk for DOAC as per GI, we will get an opinion from vascular surgery.   Body mass index is 23.31 kg/m.  Interventions:  Diet: Full liquid diet DVT Prophylaxis: SCD, pharmacological prophylaxis contraindicated due to GI bleeding    Advance goals of care discussion: Full code  Family Communication: family was not present at  bedside, at the time of interview.  The pt provided permission to discuss medical plan with the family. Opportunity was given to ask question and all questions were answered satisfactorily.   Disposition:  Pt is from Home, admitted with GI bleeidng, still has risk of bleeding, which precludes a safe discharge. Discharge to Home, when cleared by GI and stable.  Subjective: No significant events overnight, patient had 1  bowel movement yesterday, did not notice any bleeding.  No BM today and no bleeding today.  Denies any abdominal pain, no nausea vomiting. Patient is afraid that if he will not start anticoagulation then he may develop blood clots in the legs.  Patient agreed with the plan to start heparin tomorrow a.m. for 1 to 2 days to see if hemoglobin remained stable and no GI bleeding then we can resume anticoagulation.   Physical Exam: General: NAD, lying comfortably Appear in no distress, affect appropriate Eyes: PERRLA ENT: Oral Mucosa Clear, moist  Neck: no JVD,  Cardiovascular: S1 and S2 Present, no Murmur,  Respiratory: good respiratory effort, Bilateral Air entry equal and Decreased, no Crackles, no wheezes Abdomen: Bowel Sound present, Soft and no tenderness,  Skin: no rashes Extremities: no Pedal edema, no calf tenderness Neurologic: without any new focal findings Gait not checked due to patient safety concerns  Vitals:   05/10/23 1609 05/10/23 1943 05/11/23 0436 05/11/23 0724  BP: (!) 135/58 (!) 117/49 (!) 114/56 (!) 128/50  Pulse: 79 82 80 76  Resp: 18 18 16 16   Temp: 97.8 F (36.6 C) 98.9 F (37.2 C) 98.3 F (36.8 C) 98.9 F (37.2 C)  TempSrc: Oral Oral Oral Oral  SpO2:  99% 94% 97%  Weight:      Height:        Intake/Output Summary (Last 24 hours) at 05/11/2023 1340 Last data filed at 05/10/2023 1845 Gross per 24 hour  Intake 240 ml  Output 750 ml  Net -510 ml   Filed Weights   05/08/23 1438 05/08/23 2330  Weight: 66 kg 65.5 kg    Data Reviewed: I have personally reviewed and interpreted daily labs, tele strips, imagings as discussed above. I reviewed all nursing notes, pharmacy notes, vitals, pertinent old records I have discussed plan of care as described above with RN and patient/family.  CBC: Recent Labs  Lab 05/09/23 0122 05/09/23 0711 05/09/23 1312 05/09/23 1933 05/10/23 1558 05/11/23 0530  WBC 8.0 6.8 6.6 6.9  --  7.1  HGB 6.4* 7.8* 7.6* 7.9* 8.3*  8.3*  HCT 21.1* 25.6* 25.3* 25.9* 27.8* 28.0*  MCV 84.1 85.6 85.8 85.2  --  86.7  PLT 279 272 267 285  --  283   Basic Metabolic Panel: Recent Labs  Lab 05/08/23 1508 05/08/23 1510 05/09/23 0122 05/10/23 0959 05/11/23 0530  NA  --  138 139 137 136  K  --  3.4* 3.7 4.3 3.6  CL  --  107 109 107 107  CO2  --  21* 21* 21* 21*  GLUCOSE  --  126* 91 108* 95  BUN  --  30* 26* 20 18  CREATININE  --  1.63* 1.39* 1.49* 1.52*  CALCIUM  --  8.2* 7.9* 8.1* 8.1*  MG 1.8  --   --  1.6* 2.2  PHOS  --   --   --  2.9 3.6    Studies: No results found.  Scheduled Meds:  ALPRAZolam  0.25 mg Oral QHS   cholecalciferol  2,000 Units Oral Daily  vitamin B-12  1,000 mcg Oral Daily   diphenhydrAMINE  25 mg Oral QHS   gabapentin  300 mg Oral BID   multivitamin  1 tablet Oral BID   pantoprazole (PROTONIX) IV  40 mg Intravenous Q12H   pravastatin  20 mg Oral Daily   tamsulosin  0.4 mg Oral Daily   Continuous Infusions:  sodium chloride     iron sucrose 200 mg (05/11/23 0916)   PRN Meds: acetaminophen, hydrALAZINE, ondansetron (ZOFRAN) IV  Time spent: 35 minutes  Author: Gillis Santa. MD [facility] 05/11/2023 1:40 PM  To reach On-call, see care teams to locate the attending and reach out to them via [email or web address]. If 7PM-7AM, please contact night-coverage If you still have difficulty reaching the attending provider, please page the [facility] (Director on Call) for [facility] on amion for assistance.

## 2023-05-11 NOTE — Plan of Care (Signed)

## 2023-05-12 LAB — CBC
HCT: 32.1 % — ABNORMAL LOW (ref 39.0–52.0)
Hemoglobin: 9.4 g/dL — ABNORMAL LOW (ref 13.0–17.0)
MCH: 25.9 pg — ABNORMAL LOW (ref 26.0–34.0)
MCHC: 29.3 g/dL — ABNORMAL LOW (ref 30.0–36.0)
MCV: 88.4 fL (ref 80.0–100.0)
Platelets: 273 10*3/uL (ref 150–400)
RBC: 3.63 MIL/uL — ABNORMAL LOW (ref 4.22–5.81)
RDW: 21.2 % — ABNORMAL HIGH (ref 11.5–15.5)
WBC: 8.3 10*3/uL (ref 4.0–10.5)
nRBC: 0.2 % (ref 0.0–0.2)

## 2023-05-12 LAB — BASIC METABOLIC PANEL
Anion gap: 10 (ref 5–15)
BUN: 15 mg/dL (ref 8–23)
CO2: 19 mmol/L — ABNORMAL LOW (ref 22–32)
Calcium: 8.4 mg/dL — ABNORMAL LOW (ref 8.9–10.3)
Chloride: 110 mmol/L (ref 98–111)
Creatinine, Ser: 1.44 mg/dL — ABNORMAL HIGH (ref 0.61–1.24)
GFR, Estimated: 48 mL/min — ABNORMAL LOW (ref >60)
Glucose, Bld: 89 mg/dL (ref 70–99)
Potassium: 3.8 mmol/L (ref 3.5–5.1)
Sodium: 139 mmol/L (ref 135–145)

## 2023-05-12 LAB — MAGNESIUM: Magnesium: 1.9 mg/dL (ref 1.7–2.4)

## 2023-05-12 LAB — GLUCOSE, CAPILLARY
Glucose-Capillary: 91 mg/dL (ref 70–99)
Glucose-Capillary: 108 mg/dL — ABNORMAL HIGH (ref 70–99)

## 2023-05-12 LAB — PHOSPHORUS: Phosphorus: 3.5 mg/dL (ref 2.5–4.6)

## 2023-05-12 MED ORDER — ASPIRIN 81 MG PO TBEC
81.0000 mg | DELAYED_RELEASE_TABLET | Freq: Every day | ORAL | Status: DC
  Administered 2023-05-12: 81 mg via ORAL
  Filled 2023-05-12: qty 1

## 2023-05-12 MED ORDER — SODIUM BICARBONATE 650 MG PO TABS
650.0000 mg | ORAL_TABLET | Freq: Four times a day (QID) | ORAL | Status: DC
  Administered 2023-05-12: 650 mg via ORAL
  Filled 2023-05-12: qty 1

## 2023-05-12 MED ORDER — ASPIRIN 81 MG PO TBEC: 81.0000 mg | DELAYED_RELEASE_TABLET | Freq: Every day | ORAL | Status: DC

## 2023-05-12 MED ORDER — CYANOCOBALAMIN 1000 MCG PO TABS: 1000.0000 ug | ORAL_TABLET | Freq: Every day | ORAL | 0 refills | Status: AC

## 2023-05-12 MED ORDER — POLYSACCHARIDE IRON COMPLEX 150 MG PO CAPS: 150.0000 mg | ORAL_CAPSULE | Freq: Every day | ORAL | 1 refills | Status: DC

## 2023-05-12 NOTE — Care Management Important Message (Signed)
Important Message  Patient Details  Name: Bryan Welch MRN: [id number] Date of Birth: [birth year]   Medicare Important Message Given:  N/A - LOS <3 / Initial given by admissions     [name] A Ysabella Babiarz 05/12/2023, 7:58 AM

## 2023-05-12 NOTE — Discharge Summary (Signed)
[facility] Discharge Summary   [name] WUJ:[id number]  PCP: Gracelyn Nurse, MD  Date of admission: 05/08/2023   Date of discharge:  05/12/2023     Discharge Diagnoses:  Principal Problem:   Symptomatic anemia Active Problems:   GI bleeding   Iron deficiency anemia   Hyperlipidemia   HTN (hypertension)   CAD (coronary artery disease)   Chronic kidney disease, stage 3b (HCC)   Hypokalemia   Peripheral vascular disease (HCC)   Gastrointestinal tract imaging abnormality   Upper gastrointestinal hemorrhage due to angiodysplasia   Anemia   Acute GI bleeding   Admitted From: Home Disposition:  Home with [facility]  Recommendations for Outpatient Follow-up:  F/u with PCP in 1 wk, repeat CBC in 1 wk F/u Vascular Sx in 1 wk F./u with GI in 1-2 weeks Follow up LABS/TEST:  CBC in 1 wk   Follow-up Information     Gracelyn Nurse, MD Follow up in 1 week(s).   Specialty: Internal Medicine Contact information: [address] [place] [address] [phone]         Annice Needy, MD Follow up in 1 week(s).   Specialties: Vascular Surgery, Radiology, Interventional Cardiology Contact information: [address] [place] [address] [phone]         Midge Minium, MD Follow up in 2 week(s).   Specialty: Gastroenterology Contact information: [address] [place]  [place] [address] [phone]                Diet recommendation: Cardiac diet  Activity: The patient is advised to gradually reintroduce usual activities, as tolerated  Discharge Condition: stable  Code Status: Full code   History of present illness: As per the H and P dictated on admission Hospital Course:  NORMAL GRESS is a 87 y.o. male with medical history significant of AAA, ulcer of aorta, carotid artery stenosis, peripheral vascular disease on Xarelto, HTN, HLD, CAD, anixety, CKD-3b, iron deficiency anemia on IV iron, who presents with  weakness, dizziness and shortness of breath. He took last dose of ASA and Xarelto in morning 05/08/23. Hx iron deficiency, on IV iron weekly x5 doses started 06/14.   Of note, he underwent an EGD and colonoscopy by Dr. Timothy Lasso last month. At that time the patient had the findings of a large ileocecal valve polyp with a 11 to 12 mm polyp in the ascending colon with a smaller polyp in the cecum and ascending colon. 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 There was no sign of active bleeding on Dr. Ferd Hibbs procedures.    07/01: Hgb 8.8 on 5/16 --> 5.9 in the ED. CTA of abdomen/pelvis showed colonic diverticulosis, but no active GI bleeding.  Dr. Servando Snare of GI was consulted.   07/02: capsule endoscopy multiple sites of bleeding, largest sire in proximal small bowel. Dr Servando Snare planning EGD on 7/3    Assessment and Plan: Symptomatic anemia due to upper small intestinal GI bleeding, complicated by underlying iron deficiency anemia: Hgb 8.7 --> 5.9 S/p 2 units PRBC and IV Venofer. S/p IV pantoprazole 40 mg bid and Zofran IV for nausea Held Xarelto and ASA. On 7/3 s/p small bowel enteroscopy, found to have angioplastic lesions which were treated with ACP.  As per GI patient remains at high risk for oral anticoagulation. Hb 8.3 stable, patient denies any bleeding. Patient was afraid if he will not take anticoagulation then he will developed blood  clot in his legs.  D/w GI GI who recommended not awake due to high risk of bleeding.  Discussed with vascular surgery recommended aspirin only.  So aspirin was started on discharge.  Patient was advised to follow with PCP, vascular surgery and GI as an outpatient and repeat CBC after 1 week.  # Iron deficiency, transferrin saturation 5%, s/p Venofer 200 mg IV daily for 5 days followed by oral supplement Vitamin B12 level 380, goal >400, started oral supplement. # Hyperlipidemia, on Pravastatin # HTN (hypertension) Held HCTZ during hospital  stay, as patient was on IVF.  Resumed HCTZ on discharge.   # CAD (coronary artery disease), continued pravastatin and resumed aspirin on discharge. # Chronic kidney disease, stage 3b: Stable.  Recent baseline Cr 1.88 on 03/23/2023.   His creatinine is 1.63-1.44, stable. # Hypokalemia: Potassium repleted, Potassium 3.8 # Magnesium, mag repleted. Resolved # History of Peripheral vascular disease: stent placed to both legs and stent placement to right carotid artery.  Patient also has AAA and ulcer of aorta. Pt is following up with Dr. Wyn Quaker of [facility]. Continue pravastatin.  Discontinued Xarelto, resumed aspirin on discharge as per vascular surgery.  Follow-up with vascular surgery as an outpatient.   Body mass index is 23.31 kg/m.  Nutrition Interventions:  - Patient was instructed, not to drive, operate heavy machinery, perform activities at heights, swimming or participation in water activities or provide baby sitting services while on Pain, Sleep and Anxiety Medications; until his outpatient Physician has advised to do so again.  - Also recommended to not to take more than prescribed Pain, Sleep and Anxiety Medications.  Patient was seen by physical therapy, who recommended Home health, which was arranged. On the day of the discharge the patient's vitals were stable, and no other acute medical condition were reported by patient. the patient was felt safe to be discharge at Home with Home health.  Consultants: GI and d/w vascular surgery on secure chat Procedures: EGD/small bowel enteroscopy, found to have angiodysplasia, treated with ACP  Discharge Exam: General: Appear in no distress, no Rash; Oral Mucosa Clear, moist. Cardiovascular: S1 and S2 Present, no Murmur, Respiratory: normal respiratory effort, Bilateral Air entry present and no Crackles, no wheezes Abdomen: Bowel Sound present, Soft and no tenderness, no hernia Extremities: no Pedal edema, no calf tenderness Neurology: alert and  oriented to time, place, and person affect appropriate.  Filed Weights   05/08/23 1438 05/08/23 2330  Weight: 66 kg 65.5 kg   Vitals:   05/12/23 0456 05/12/23 0818  BP: (!) 127/51 (!) 115/52  Pulse: 81 83  Resp: 20 16  Temp: 98.5 F (36.9 C) 98 F (36.7 C)  SpO2: 99% 96%    DISCHARGE MEDICATION: Allergies as of 05/12/2023       Reactions   Codeine Itching        Medication List     STOP taking these medications    Xarelto 20 MG Tabs tablet Generic drug: rivaroxaban       TAKE these medications    acetaminophen 500 MG tablet Commonly known as: TYLENOL Take 1,000 mg by mouth at bedtime.   ALPRAZolam 0.25 MG tablet Commonly known as: XANAX Take 0.25 mg by mouth at bedtime.   aspirin EC 81 MG tablet Take 81 mg daily by mouth.   Cholecalciferol 25 MCG (1000 UT) capsule Take 2,000 Units by mouth daily.   cyanocobalamin 1000 MCG tablet Take 1 tablet (1,000 mcg total) by mouth daily. Start taking on:  May 13, 2023   diphenhydrAMINE 25 mg capsule Commonly known as: BENADRYL Take 25 mg at bedtime by mouth.   gabapentin 300 MG capsule Commonly known as: NEURONTIN Take 300 mg by mouth 2 (two) times daily.   hydrochlorothiazide 25 MG tablet Commonly known as: HYDRODIURIL Take 1 tablet (25 mg total) by mouth daily.   iron polysaccharides 150 MG capsule Commonly known as: NIFEREX Take 1 capsule (150 mg total) by mouth daily.   mupirocin ointment 2 % Commonly known as: BACTROBAN Apply 1 Application topically 3 (three) times daily.   omeprazole 20 MG capsule Commonly known as: PRILOSEC Take 20 mg by mouth at bedtime.   potassium chloride 10 MEQ tablet Commonly known as: KLOR-CON Take 10 mEq by mouth daily.   pravastatin 20 MG tablet Commonly known as: PRAVACHOL Take 20 mg by mouth at bedtime.   PRESERVISION AREDS 2 PO Take 1 tablet by mouth 2 (two) times daily.   tamsulosin 0.4 MG Caps capsule Commonly known as: FLOMAX Take 0.4 mg by mouth  daily.       Allergies  Allergen Reactions   Codeine Itching   Discharge Instructions     Call MD for:   Complete by: As directed    GI Bleeding   Call MD for:  difficulty breathing, headache or visual disturbances   Complete by: As directed    Call MD for:  extreme fatigue   Complete by: As directed    Call MD for:  persistant dizziness or light-headedness   Complete by: As directed    Call MD for:  persistant nausea and vomiting   Complete by: As directed    Call MD for:  severe uncontrolled pain   Complete by: As directed    Call MD for:  temperature >100.4   Complete by: As directed    Diet - low sodium heart healthy   Complete by: As directed    Discharge instructions   Complete by: As directed    F/u with PCP in 1 wk, repeat CBC in 1 wk F/u Vascular Sx in 1 wk F./u with GI in 1-2 weeks   Increase activity slowly   Complete by: As directed        The results of significant diagnostics from this hospitalization (including imaging, microbiology, ancillary and laboratory) are listed below for reference.    Significant Diagnostic Studies: CT Angio Abd/Pel W and/or Wo Contrast  Result Date: 05/08/2023 CLINICAL DATA:  Dizziness and generalized weakness, recent blood transfusion, lower GI bleed EXAM: CTA ABDOMEN AND PELVIS WITHOUT AND WITH CONTRAST TECHNIQUE: Multidetector CT imaging of the abdomen and pelvis was performed using the standard protocol during bolus administration of intravenous contrast. Multiplanar reconstructed images and MIPs were obtained and reviewed to evaluate the vascular anatomy. RADIATION DOSE REDUCTION: This exam was performed according to the departmental dose-optimization program which includes automated exposure control, adjustment of the mA and/or kV according to patient size and/or use of iterative reconstruction technique. CONTRAST:  80mL OMNIPAQUE IOHEXOL 350 MG/ML SOLN COMPARISON:  03/22/2023 FINDINGS: VASCULAR Aorta: Extensive partially  calcified atheromatous plaque in the visualized distal descending and abdominal aorta. Fusiform infrarenal aneurysm 4.3 cm maximum diameter common (stable since previous) terminating above bifurcation. No evidence of leak or rupture. Celiac: Calcified ostial plaque without stenosis. SMA: Partially calcified ostial plaque extending over length of 2.3 cm resulting in at least mild stenosis, atheromatous but patent distally. Renals: Single left, with partially calcified ostial plaque resulting in at least mild stenosis,  atheromatous but patent distally. Single right renal artery is more heavily diseased with at least moderate proximal stenosis, and plaque throughout its length. IMA: Short-segment origin occlusion, reconstituted distally by visceral collaterals. Inflow: Extensive calcified atheromatous plaque through the common iliac arteries. Patent right external iliac stent. Bilateral internal iliac ostial stenoses. Proximal Outflow: Moderately atheromatous, patent. Veins: Patent hepatic veins, portal vein, SMV, splenic vein, bilateral renal veins, iliac venous confluence and IVC. Review of the MIP images confirms the above findings. NON-VASCULAR Lower chest: No pleural or pericardial effusion. Visualized lung bases clear. Hepatobiliary: Subcentimeter calcified gallstone in the dependent aspect of the nondilated gallbladder. There is a small amount of pericholecystic fluid. No focal liver lesion or biliary ductal dilatation. Pancreas: Unremarkable. No pancreatic ductal dilatation or surrounding inflammatory changes. Spleen: Normal in size without focal abnormality. Adrenals/Urinary Tract: No adrenal mass. Right renal parenchymal loss. Chronic right hydronephrosis. No urolithiasis. Urinary bladder distended. Stomach/Bowel: Stomach is incompletely distended, without acute finding. Small bowel decompressed. Normal appendix. Scattered distal descending and innumerable sigmoid diverticula without adjacent inflammatory  change. No acute intraluminal hemorrhage is identified. Lymphatic: No abdominal or pelvic adenopathy. Reproductive: Prostate enlargement with central coarse calcifications. Other: No ascites.  No free air. Musculoskeletal: Lumbar dextroscoliosis with fusion L2-L4, multilevel spondylitic change. No fracture or worrisome bone lesion. IMPRESSION: 1. Colonic diverticulosis. No acute GI hemorrhage identified. 2. 4.3 cm infrarenal abdominal aortic aneurysm, stable since previous. Recommend follow-up every 12 months and vascular consultation. This recommendation follows ACR consensus guidelines: White Paper of the ACR Incidental Findings Committee II on Vascular Findings. J Am Coll Radiol 2013; 10:789-794. 3. Cholelithiasis with small amount of pericholecystic fluid. 4. Chronic right hydronephrosis with right renal parenchymal loss. 5.  Aortic Atherosclerosis (ICD10-I70.0). Electronically Signed   By: Corlis Leak M.D.   On: 05/08/2023 16:40    Microbiology: No results found for this or any previous visit (from the past 240 hour(s)).   Labs: CBC: Recent Labs  Lab 05/09/23 0711 05/09/23 1312 05/09/23 1933 05/10/23 1558 05/11/23 0530 05/12/23 0516  WBC 6.8 6.6 6.9  --  7.1 8.3  HGB 7.8* 7.6* 7.9* 8.3* 8.3* 9.4*  HCT 25.6* 25.3* 25.9* 27.8* 28.0* 32.1*  MCV 85.6 85.8 85.2  --  86.7 88.4  PLT 272 267 285  --  283 273   Basic Metabolic Panel: Recent Labs  Lab 05/08/23 1508 05/08/23 1510 05/09/23 0122 05/10/23 0959 05/11/23 0530 05/12/23 0516  NA  --  138 139 137 136 139  K  --  3.4* 3.7 4.3 3.6 3.8  CL  --  107 109 107 107 110  CO2  --  21* 21* 21* 21* 19*  GLUCOSE  --  126* 91 108* 95 89  BUN  --  30* 26* 20 18 15   CREATININE  --  1.63* 1.39* 1.49* 1.52* 1.44*  CALCIUM  --  8.2* 7.9* 8.1* 8.1* 8.4*  MG 1.8  --   --  1.6* 2.2 1.9  PHOS  --   --   --  2.9 3.6 3.5   Liver Function Tests: Recent Labs  Lab 05/08/23 1510  AST 18  ALT 9  ALKPHOS 54  BILITOT 0.5  PROT 6.7  ALBUMIN 3.9    No results for input(s): "LIPASE", "AMYLASE" in the last 168 hours. No results for input(s): "AMMONIA" in the last 168 hours. Cardiac Enzymes: No results for input(s): "CKTOTAL", "CKMB", "CKMBINDEX", "TROPONINI" in the last 168 hours. BNP (last 3 results) No results for input(s): "BNP" in the  last 8760 hours. CBG: Recent Labs  Lab 05/09/23 0729 05/11/23 0726 05/12/23 0809  GLUCAP 89 96 91    Time spent: 35 minutes  Signed:  Gillis Santa  [facility] 05/12/2023 10:43 AM

## 2023-05-15 ENCOUNTER — Encounter: Payer: Self-pay | Admitting: Gastroenterology

## 2023-05-17 ENCOUNTER — Ambulatory Visit (INDEPENDENT_AMBULATORY_CARE_PROVIDER_SITE_OTHER): Payer: Medicare Other | Admitting: Nurse Practitioner

## 2023-05-19 ENCOUNTER — Ambulatory Visit (INDEPENDENT_AMBULATORY_CARE_PROVIDER_SITE_OTHER): Payer: Medicare Other | Admitting: Nurse Practitioner

## 2023-05-19 ENCOUNTER — Encounter (INDEPENDENT_AMBULATORY_CARE_PROVIDER_SITE_OTHER): Payer: Self-pay | Admitting: Nurse Practitioner

## 2023-05-19 ENCOUNTER — Inpatient Hospital Stay: Payer: Medicare Other

## 2023-05-19 VITALS — BP 122/41 | HR 75 | Temp 95.4°F | Resp 19

## 2023-05-19 DIAGNOSIS — I719 Aortic aneurysm of unspecified site, without rupture: Secondary | ICD-10-CM | POA: Diagnosis not present

## 2023-05-19 DIAGNOSIS — I6523 Occlusion and stenosis of bilateral carotid arteries: Secondary | ICD-10-CM

## 2023-05-19 DIAGNOSIS — I10 Essential (primary) hypertension: Secondary | ICD-10-CM

## 2023-05-19 DIAGNOSIS — I7143 Infrarenal abdominal aortic aneurysm, without rupture: Secondary | ICD-10-CM

## 2023-05-19 DIAGNOSIS — D509 Iron deficiency anemia, unspecified: Secondary | ICD-10-CM | POA: Diagnosis not present

## 2023-05-19 DIAGNOSIS — I7025 Atherosclerosis of native arteries of other extremities with ulceration: Secondary | ICD-10-CM

## 2023-05-19 MED ORDER — SODIUM CHLORIDE 0.9 % IV SOLN
200.0000 mg | Freq: Once | INTRAVENOUS | Status: DC
  Filled 2023-05-19: qty 10

## 2023-05-19 MED ORDER — SODIUM CHLORIDE 0.9 % IV SOLN
200.0000 mg | Freq: Once | INTRAVENOUS | Status: AC
  Administered 2023-05-19: 200 mg via INTRAVENOUS
  Filled 2023-05-19: qty 200

## 2023-05-19 MED ORDER — SODIUM CHLORIDE 0.9 % IV SOLN
Freq: Once | INTRAVENOUS | Status: AC
  Filled 2023-05-19: qty 250

## 2023-05-20 NOTE — Progress Notes (Signed)
Subjective:    Patient ID: Bryan Welch, male    DOB: 1936/04/05, 87 y.o.   MRN: [id number] Chief Complaint  Patient presents with   Follow-up    1 week follow up     Bryan Welch is an 87 year old male who presents today for follow-up of his abdominal aortic aneurysm.  The patient was recently in the hospital due to anemia.  In the midst of workup it was found that the patient had a 4.3 cm abdominal aortic aneurysm as well as aortic ulceration of the thoracic aorta.  At the time due to known lesions in the abdomen as a likely cause for his anemia, his Xarelto was stopped.  Since that time he has not had any abdominal pain or distal signs symptoms of embolization.  He is also got a history of peripheral arterial disease and carotid artery stenosis.  He has not had any worsening issues claudication or rest pain.  Additionally he has not had any TIA or CVA-like symptoms or the development of amaurosis fugax.  He notes that he still has polyps that require removal and has an upcoming procedure planned at [facility] at the end of August.  Overall he has been in his standard state of health since his discharge from hospital.    Review of Systems  All other systems reviewed and are negative.      Objective:   Physical Exam Vitals reviewed.  HENT:     Head: Normocephalic.  Cardiovascular:     Rate and Rhythm: Normal rate.  Pulmonary:     Effort: Pulmonary effort is normal.  Skin:    General: Skin is warm and dry.  Neurological:     Mental Status: He is alert and oriented to person, place, and time.  Psychiatric:        Mood and Affect: Mood normal.        Behavior: Behavior normal.        Thought Content: Thought content normal.        Judgment: Judgment normal.     BP 137/72 (BP Location: Left Arm)   Pulse 82   Resp 18   Ht 5\' 6"  (1.676 m)   Wt 141 lb 9.6 oz (64.2 kg)   BMI 22.85 kg/m   Past Medical History:  Diagnosis Date   Arthritis    Toes   Coronary artery  disease    History of hiatal hernia    Hyperlipidemia    Hypertension    Kidney disorder    "right kidney doesn't work"   Presence of stent in artery    "4 in legs, 1 in neck"   Vascular abnormality    Wears dentures    full upper and lower    Social History   Socioeconomic History   Marital status: Married    Spouse name: Not on file   Number of children: Not on file   Years of education: Not on file   Highest education level: Not on file  Occupational History   Not on file  Tobacco Use   Smoking status: Former    Current packs/day: 0.00    Types: Cigarettes    Quit date: 2001    Years since quitting: 23.5   Smokeless tobacco: Never  Vaping Use   Vaping status: Never Used  Substance and Sexual Activity   Alcohol use: Not Currently   Drug use: No   Sexual activity: Not on file  Other Topics Concern   Not  on file  Social History Narrative   Not on file   Social Determinants of Health   Financial Resource Strain: Not on file  Food Insecurity: No Food Insecurity (05/08/2023)   Hunger Vital Sign    Worried About Running Out of Food in the Last Year: Never true    Ran Out of Food in the Last Year: Never true  Transportation Needs: No Transportation Needs (05/08/2023)   PRAPARE - Administrator, Civil Service (Medical): No    Lack of Transportation (Non-Medical): No  Physical Activity: Not on file  Stress: Not on file  Social Connections: Not on file  Intimate Partner Violence: Not At Risk (05/08/2023)   Humiliation, Afraid, Rape, and Kick questionnaire    Fear of Current or Ex-Partner: No    Emotionally Abused: No    Physically Abused: No    Sexually Abused: No    Past Surgical History:  Procedure Laterality Date   AMPUTATION TOE Right 09/16/2021   Procedure: AMPUTATION TOE;  Surgeon: Rosetta Posner, DPM;  Location: [facility];  Service: Podiatry;  Laterality: Right;   BACK SURGERY     CAROTID STENT Right    CATARACT EXTRACTION W/PHACO Left  06/17/2019   Procedure: CATARACT EXTRACTION PHACO AND INTRAOCULAR LENS PLACEMENT (IOC) LEFT;  Surgeon: Nevada Crane, MD;  Location: [facility];  Service: Ophthalmology;  Laterality: Left;   CATARACT EXTRACTION W/PHACO Right 07/08/2019   Procedure: CATARACT EXTRACTION PHACO AND INTRAOCULAR LENS PLACEMENT (IOC) right  00:39.2  10.8%  5.02  ;  Surgeon: Nevada Crane, MD;  Location: [facility];  Service: Ophthalmology;  Laterality: Right;   COLONOSCOPY WITH PROPOFOL N/A 04/13/2023   Procedure: COLONOSCOPY WITH PROPOFOL;  Surgeon: Jaynie Collins, DO;  Location: [facility] END[facility]COPY;  Service: Gastroenterology;  Laterality: N/A;   ENTEROSCOPY N/A 05/10/2023   Procedure: ENTEROSCOPY;  Surgeon: Midge Minium, MD;  Location: [facility] ENDOSCOPY;  Service: Endoscopy;  Laterality: N/A;   ESOPHAGOGASTRODUODENOSCOPY (EGD) WITH PROPOFOL N/A 04/13/2023   Procedure: ESOPHAGOGASTRODUODENOSCOPY (EGD) WITH PROPOFOL;  Surgeon: Jaynie Collins, DO;  Location: [facility] ENDOSCOPY;  Service: Gastroenterology;  Laterality: N/A;   EYE SURGERY     GIVENS CAPSULE STUDY N/A 05/09/2023   Procedure: GIVENS CAPSULE STUDY;  Surgeon: Midge Minium, MD;  Location: [facility] ENDOSCOPY;  Service: Endoscopy;  Laterality: N/A;   HOT HEMOSTASIS  05/10/2023   Procedure: HOT HEMOSTASIS (ARGON PLASMA COAGULATION/BICAP);  Surgeon: Midge Minium, MD;  Location: [facility] ENDOSCOPY;  Service: Endoscopy;;   LEG SURGERY Bilateral    stent placement   LOWER EXTREMITY ANGIOGRAPHY Right 08/19/2021   Procedure: LOWER EXTREMITY ANGIOGRAPHY;  Surgeon: Annice Needy, MD;  Location: [facility] INVASIVE CV LAB;  Service: Cardiovascular;  Laterality: Right;   PERIPHERAL VASCULAR BALLOON ANGIOPLASTY Right 09/21/2017   Procedure: PERIPHERAL VASCULAR BALLOON ANGIOPLASTY;  Surgeon: Annice Needy, MD;  Location: [facility] INVASIVE CV LAB;  Service: Cardiovascular;  Laterality: Right;   REVERSE SHOULDER ARTHROPLASTY Right 11/26/2019   Procedure: REVERSE SHOULDER  ARTHROPLASTY;  Surgeon: Christena Flake, MD;  Location: [facility] ORS;  Service: Orthopedics;  Laterality: Right;    Family History  Problem Relation Age of Onset   Bladder Cancer Mother    Prostate cancer Father    Prostate cancer Brother    Diabetes Maternal Grandfather     Allergies  Allergen Reactions   Codeine Itching       Latest Ref Rng & Units 05/12/2023    5:16 AM 05/11/2023    5:30 AM  05/10/2023    3:58 PM  CBC  WBC 4.0 - 10.5 K/uL 8.3  7.1    Hemoglobin 13.0 - 17.0 g/dL 9.4  8.3  8.3   Hematocrit 39.0 - 52.0 % 32.1  28.0  27.8   Platelets 150 - 400 K/uL 273  283        CMP     Component Value Date/Time   NA 139 05/12/2023 0516   NA 141 03/22/2013 0237   K 3.8 05/12/2023 0516   K 4.3 03/22/2013 0237   CL 110 05/12/2023 0516   CL 110 (H) 03/22/2013 0237   CO2 19 (L) 05/12/2023 0516   CO2 24 03/22/2013 0237   GLUCOSE 89 05/12/2023 0516   GLUCOSE 101 (H) 03/22/2013 0237   BUN 15 05/12/2023 0516   BUN 19 (H) 03/22/2013 0237   CREATININE 1.44 (H) 05/12/2023 0516   CREATININE 1.53 (H) 03/22/2013 0237   CALCIUM 8.4 (L) 05/12/2023 0516   CALCIUM 8.1 (L) 03/22/2013 0237   PROT 6.7 05/08/2023 1510   PROT 8.3 (H) 03/13/2013 1507   ALBUMIN 3.9 05/08/2023 1510   ALBUMIN 4.0 03/13/2013 1507   AST 18 05/08/2023 1510   AST 58 (H) 03/13/2013 1507   ALT 9 05/08/2023 1510   ALT 52 03/13/2013 1507   ALKPHOS 54 05/08/2023 1510   ALKPHOS 116 03/13/2013 1507   BILITOT 0.5 05/08/2023 1510   BILITOT 0.4 03/13/2013 1507   GFRNONAA 48 (L) 05/12/2023 0516   GFRNONAA 44 (L) 03/22/2013 0237     No results found.     Assessment & Plan:   1. Infrarenal abdominal aortic aneurysm (AAA) without rupture (HCC) CT scan in the hospital showed an abdominal aortic aneurysm of 4.3 cm.  This has been consistent with his previous duplexes that has been followed in our office and his most recent CT scan done on July 1 also indicate that there is not been any rapid growth of this aneurysm.   Will maintain the patient on his regular follow-up schedule in the next several months to maintain follow-up evaluation of his abdominal aortic aneurysm.   2. Ulcer of aorta (HCC) This is currently stable.  Reviewed CT with Dr. Wyn Quaker and at this time no intervention recommended.  Will continue to maintain observation with follow-up  3. Primary hypertension Continue antihypertensive medications as already ordered, these medications have been reviewed and there are no changes at this time.  4. Bilateral carotid artery stenosis Patient recently had studies earlier this year.  He had stable carotid stenosis.  The patient will follow-up in the next several months for his routine follow-up.  5. Atherosclerosis of native arteries of the extremities with ulceration (HCC) Currently no worsening of claudication symptoms and no development of lower extremity ulcerations.  He will have upcoming studies in the next few months on his routine follow-up scheduled.   Current Outpatient Medications on File Prior to Visit  Medication Sig Dispense Refill   acetaminophen (TYLENOL) 500 MG tablet Take 1,000 mg by mouth at bedtime.     ALPRAZolam (XANAX) 0.25 MG tablet Take 0.25 mg by mouth at bedtime.     aspirin EC 81 MG tablet Take 81 mg daily by mouth.      Cholecalciferol 25 MCG (1000 UT) capsule Take 2,000 Units by mouth daily.     cyanocobalamin 1000 MCG tablet Take 1 tablet (1,000 mcg total) by mouth daily. 90 tablet 0   diphenhydrAMINE (BENADRYL) 25 mg capsule Take 25 mg at bedtime by  mouth.      gabapentin (NEURONTIN) 300 MG capsule Take 300 mg by mouth 2 (two) times daily.     hydrochlorothiazide (HYDRODIURIL) 25 MG tablet Take 1 tablet (25 mg total) by mouth daily. 30 tablet 0   iron polysaccharides (NIFEREX) 150 MG capsule Take 1 capsule (150 mg total) by mouth daily. 90 capsule 1   Multiple Vitamins-Minerals (PRESERVISION AREDS 2 PO) Take 1 tablet by mouth 2 (two) times daily.     mupirocin ointment  (BACTROBAN) 2 % Apply 1 Application topically 3 (three) times daily.     omeprazole (PRILOSEC) 20 MG capsule Take 20 mg by mouth at bedtime.     potassium chloride (KLOR-CON) 10 MEQ tablet Take 10 mEq by mouth daily.     pravastatin (PRAVACHOL) 20 MG tablet Take 20 mg by mouth at bedtime.     tamsulosin (FLOMAX) 0.4 MG CAPS capsule Take 0.4 mg by mouth daily.  3   No current facility-administered medications on file prior to visit.    There are no Patient Instructions on file for this visit. No follow-ups on file.   Georgiana Spinner, NP

## 2023-05-22 ENCOUNTER — Telehealth: Payer: Self-pay

## 2023-05-22 NOTE — Telephone Encounter (Signed)
Patient called in to schedule an appointment with Dr. Servando Snare. The patient is currently established with [facility], I advised him that he will have to call his current GI doctor. He stated that Dr. Servando Snare complete his last colonoscopy. I informed him of his current appointment with [facility] and his appointment with [facility], and he said that he will call [facility].

## 2023-05-25 MED FILL — Iron Sucrose Inj 20 MG/ML (Fe Equiv): INTRAVENOUS | Qty: 10 | Status: AC

## 2023-05-26 ENCOUNTER — Inpatient Hospital Stay: Payer: Medicare Other

## 2023-06-02 ENCOUNTER — Inpatient Hospital Stay: Payer: Medicare Other

## 2023-06-02 VITALS — BP 121/54 | HR 67 | Temp 97.8°F | Resp 16

## 2023-06-02 DIAGNOSIS — D509 Iron deficiency anemia, unspecified: Secondary | ICD-10-CM

## 2023-06-02 MED ORDER — SODIUM CHLORIDE 0.9 % IV SOLN
200.0000 mg | Freq: Once | INTRAVENOUS | Status: AC
  Administered 2023-06-02: 200 mg via INTRAVENOUS
  Filled 2023-06-02: qty 200

## 2023-06-02 MED ORDER — SODIUM CHLORIDE 0.9 % IV SOLN
Freq: Once | INTRAVENOUS | Status: AC
  Filled 2023-06-02: qty 250

## 2023-06-02 NOTE — Progress Notes (Signed)
Pt has been educated and understands. Pt declined to stay 30 mins after iron infusion. VSS.

## 2023-06-08 ENCOUNTER — Inpatient Hospital Stay
Admission: EM | Admit: 2023-06-08 | Discharge: 2023-06-11 | DRG: 871 | Disposition: A | Discharge status: Home Health | Payer: Medicare Other | Attending: Internal Medicine | Admitting: Internal Medicine

## 2023-06-08 ENCOUNTER — Other Ambulatory Visit: Payer: Self-pay

## 2023-06-08 ENCOUNTER — Emergency Department: Payer: Medicare Other

## 2023-06-08 ENCOUNTER — Encounter: Payer: Self-pay | Admitting: Emergency Medicine

## 2023-06-08 DIAGNOSIS — R531 Weakness: Secondary | ICD-10-CM

## 2023-06-08 DIAGNOSIS — D509 Iron deficiency anemia, unspecified: Secondary | ICD-10-CM | POA: Diagnosis present

## 2023-06-08 DIAGNOSIS — Z833 Family history of diabetes mellitus: Secondary | ICD-10-CM

## 2023-06-08 DIAGNOSIS — R7989 Other specified abnormal findings of blood chemistry: Secondary | ICD-10-CM | POA: Diagnosis present

## 2023-06-08 DIAGNOSIS — Z96611 Presence of right artificial shoulder joint: Secondary | ICD-10-CM | POA: Diagnosis present

## 2023-06-08 DIAGNOSIS — A4189 Other specified sepsis: Principal | ICD-10-CM | POA: Diagnosis present

## 2023-06-08 DIAGNOSIS — N1832 Chronic kidney disease, stage 3b: Secondary | ICD-10-CM | POA: Diagnosis present

## 2023-06-08 DIAGNOSIS — U071 COVID-19: Secondary | ICD-10-CM | POA: Diagnosis present

## 2023-06-08 DIAGNOSIS — Z87891 Personal history of nicotine dependence: Secondary | ICD-10-CM

## 2023-06-08 DIAGNOSIS — Z8052 Family history of malignant neoplasm of bladder: Secondary | ICD-10-CM

## 2023-06-08 DIAGNOSIS — I714 Abdominal aortic aneurysm, without rupture, unspecified: Secondary | ICD-10-CM | POA: Diagnosis present

## 2023-06-08 DIAGNOSIS — W06XXXA Fall from bed, initial encounter: Secondary | ICD-10-CM | POA: Diagnosis present

## 2023-06-08 DIAGNOSIS — K219 Gastro-esophageal reflux disease without esophagitis: Secondary | ICD-10-CM | POA: Diagnosis present

## 2023-06-08 DIAGNOSIS — Z79899 Other long term (current) drug therapy: Secondary | ICD-10-CM

## 2023-06-08 DIAGNOSIS — E785 Hyperlipidemia, unspecified: Secondary | ICD-10-CM | POA: Diagnosis present

## 2023-06-08 DIAGNOSIS — I739 Peripheral vascular disease, unspecified: Secondary | ICD-10-CM | POA: Diagnosis present

## 2023-06-08 DIAGNOSIS — Z8719 Personal history of other diseases of the digestive system: Secondary | ICD-10-CM

## 2023-06-08 DIAGNOSIS — I10 Essential (primary) hypertension: Secondary | ICD-10-CM | POA: Diagnosis present

## 2023-06-08 DIAGNOSIS — Z8042 Family history of malignant neoplasm of prostate: Secondary | ICD-10-CM

## 2023-06-08 DIAGNOSIS — I6523 Occlusion and stenosis of bilateral carotid arteries: Secondary | ICD-10-CM | POA: Diagnosis present

## 2023-06-08 DIAGNOSIS — G629 Polyneuropathy, unspecified: Secondary | ICD-10-CM | POA: Diagnosis present

## 2023-06-08 DIAGNOSIS — I70219 Atherosclerosis of native arteries of extremities with intermittent claudication, unspecified extremity: Secondary | ICD-10-CM | POA: Diagnosis present

## 2023-06-08 DIAGNOSIS — R000 Tachycardia, unspecified: Secondary | ICD-10-CM | POA: Diagnosis present

## 2023-06-08 DIAGNOSIS — F419 Anxiety disorder, unspecified: Secondary | ICD-10-CM | POA: Diagnosis present

## 2023-06-08 DIAGNOSIS — I2510 Atherosclerotic heart disease of native coronary artery without angina pectoris: Secondary | ICD-10-CM | POA: Diagnosis present

## 2023-06-08 DIAGNOSIS — A0839 Other viral enteritis: Secondary | ICD-10-CM | POA: Diagnosis present

## 2023-06-08 DIAGNOSIS — N400 Enlarged prostate without lower urinary tract symptoms: Secondary | ICD-10-CM | POA: Diagnosis present

## 2023-06-08 DIAGNOSIS — Z89421 Acquired absence of other right toe(s): Secondary | ICD-10-CM

## 2023-06-08 DIAGNOSIS — E872 Acidosis, unspecified: Secondary | ICD-10-CM | POA: Diagnosis present

## 2023-06-08 DIAGNOSIS — A419 Sepsis, unspecified organism: Secondary | ICD-10-CM

## 2023-06-08 DIAGNOSIS — I129 Hypertensive chronic kidney disease with stage 1 through stage 4 chronic kidney disease, or unspecified chronic kidney disease: Secondary | ICD-10-CM | POA: Diagnosis present

## 2023-06-08 LAB — PROTIME-INR
INR: 1.2 (ref 0.8–1.2)
Prothrombin Time: 15.1 seconds (ref 11.4–15.2)

## 2023-06-08 LAB — TYPE AND SCREEN

## 2023-06-08 LAB — SARS CORONAVIRUS 2 BY RT PCR: SARS Coronavirus 2 by RT PCR: POSITIVE — AB

## 2023-06-08 LAB — LACTIC ACID, PLASMA: Lactic Acid, Venous: 2.7 mmol/L (ref 0.5–1.9)

## 2023-06-08 MED ORDER — SODIUM CHLORIDE 0.9 % IV BOLUS (SEPSIS)
1000.0000 mL | Freq: Once | INTRAVENOUS | Status: AC
  Administered 2023-06-08: 1000 mL via INTRAVENOUS

## 2023-06-08 MED ORDER — SODIUM CHLORIDE 0.9 % IV SOLN
2.0000 g | INTRAVENOUS | Status: DC
  Administered 2023-06-08: 2 g via INTRAVENOUS
  Filled 2023-06-08: qty 20

## 2023-06-08 NOTE — Progress Notes (Signed)
Elink monitoring for the code sepsis protocol.  

## 2023-06-08 NOTE — ED Notes (Signed)
Dr. Scotty Court aware pt is sepsis pt.

## 2023-06-08 NOTE — ED Provider Notes (Signed)
[facility] Provider Note    Event Date/Time   First MD Initiated Contact with Patient 06/08/23 2310     (approximate)   History   Chief Complaint: Weakness and Emesis   HPI  Bryan Welch is a 87 y.o. male with a history of CAD, CKD, abdominal aortic aneurysm, hypertension, GI bleed who comes to the ED complaining of generalized weakness, feeling dizzy with standing.  Denies syncope or fall.  Denies any pain, no chest pain shortness of breath or abdominal pain.  Has been having vomiting and diarrhea today.  No black or bloody stool.  Reports he was feeling okay until today, eating all right and drinking fluids.  Denies any known sick contacts.     Physical Exam   Triage Vital Signs: ED Triage Vitals  Encounter Vitals Group     BP --      Systolic BP Percentile --      Diastolic BP Percentile --      Pulse Rate 06/08/23 2308 (!) 111     Resp 06/08/23 2308 (!) 30     Temp 06/08/23 2308 (!) 103.2 F (39.6 C)     Temp Source 06/08/23 2308 Oral     SpO2 06/08/23 2308 95 %     Weight 06/08/23 2307 141 lb 9.6 oz (64.2 kg)     Height 06/08/23 2307 5\' 6"  (1.676 m)     Head Circumference --      Peak Flow --      Pain Score 06/08/23 2306 0     Pain Loc --      Pain Education --      Exclude from Growth Chart --     Most recent vital signs: Vitals:   06/09/23 0200 06/09/23 0223  BP: (!) 113/43   Pulse: 93   Resp: 19   Temp:  100.3 F (37.9 C)  SpO2: 100%     General: Awake, no distress.  Ill-appearing CV:  Good peripheral perfusion.  Tachycardia heart rate 110 Resp:  Normal effort.  Clear to auscultation bilaterally Abd:  No distention.  Soft nontender Other:  Dry oral mucosa.  Mottled lower extremity skin   ED Results / Procedures / Treatments   Labs (all labs ordered are listed, but only abnormal results are displayed) Labs Reviewed  SARS CORONAVIRUS 2 BY RT PCR - Abnormal; Notable for the following components:      Result Value    SARS Coronavirus 2 by RT PCR POSITIVE (*)    All other components within normal limits  COMPREHENSIVE METABOLIC PANEL - Abnormal; Notable for the following components:   CO2 21 (*)    Glucose, Bld 149 (*)    BUN 25 (*)    Creatinine, Ser 1.52 (*)    Calcium 8.3 (*)    GFR, Estimated 45 (*)    All other components within normal limits  LACTIC ACID, PLASMA - Abnormal; Notable for the following components:   Lactic Acid, Venous 2.7 (*)    All other components within normal limits  CBC WITH DIFFERENTIAL/PLATELET - Abnormal; Notable for the following components:   WBC 12.0 (*)    RDW 17.5 (*)    Neutro Abs 10.6 (*)    Lymphs Abs 0.3 (*)    Abs Immature Granulocytes 0.08 (*)    All other components within normal limits  URINALYSIS, W/ REFLEX TO CULTURE (INFECTION SUSPECTED) - Abnormal; Notable for the following components:   Color, Urine YELLOW (*)  APPearance CLEAR (*)    Hgb urine dipstick SMALL (*)    Protein, ur 30 (*)    All other components within normal limits  TROPONIN I (HIGH SENSITIVITY) - Abnormal; Notable for the following components:   Troponin I (High Sensitivity) 29 (*)    All other components within normal limits  CULTURE, BLOOD (ROUTINE X 2)  CULTURE, BLOOD (ROUTINE X 2)  LACTIC ACID, PLASMA  PROTIME-INR  PROCALCITONIN  TYPE AND SCREEN     EKG    RADIOLOGY Chest x-ray interpreted by me, appears normal.  Radiology report reviewed   PROCEDURES:  .Critical Care  Performed by: Sharman Cheek, MD Authorized by: Sharman Cheek, MD   Critical care provider statement:    Critical care time (minutes):  35   Critical care time was exclusive of:  Separately billable procedures and treating other patients   Critical care was necessary to treat or prevent imminent or life-threatening deterioration of the following conditions:  Sepsis   Critical care was time spent personally by me on the following activities:  Development of treatment plan with patient  or surrogate, discussions with consultants, evaluation of patient's response to treatment, examination of patient, obtaining history from patient or surrogate, ordering and performing treatments and interventions, ordering and review of laboratory studies, ordering and review of radiographic studies, pulse oximetry, re-evaluation of patient's condition and review of old charts   Care discussed with: admitting provider      MEDICATIONS ORDERED IN ED: Medications  cefTRIAXone (ROCEPHIN) 2 g in sodium chloride 0.9 % 100 mL IVPB (0 g Intravenous Stopped 06/09/23 0014)  sodium chloride 0.9 % bolus 1,000 mL (1,000 mLs Intravenous New Bag/Given 06/08/23 2344)  acetaminophen (TYLENOL) tablet 1,000 mg (1,000 mg Oral Given 06/09/23 0145)     IMPRESSION / MDM / ASSESSMENT AND PLAN / ED COURSE  I reviewed the triage vital signs and the nursing notes.  DDx: COVID, UTI, pneumonia, sepsis, anemia, AKI, electrolyte abnormality, dehydration  Patient's presentation is most consistent with acute presentation with potential threat to life or bodily function.  Patient presents with fever tachycardia tachypnea, skin exam suggestive of impaired peripheral perfusion (though distal pulses are strong).  Concern for sepsis.  Workup initiated, will give IV Rocephin along with IV fluids and Tylenol.  Will check COVID.   Clinical Course as of 06/09/23 0302  Caleen Essex Jun 09, 2023  0136 Patient is COVID-positive.  Son and daughter-in-law note the patient has been very weak today, fell out of bed and was unable to get up.  Patient lives with his spouse independently, and she is not able to adequately help him in his current condition. [PS]    Clinical Course User Index [PS] Sharman Cheek, MD    ----------------------------------------- 3:02 AM on 06/09/2023 ----------------------------------------- Case discussed with hospitalist   FINAL CLINICAL IMPRESSION(S) / ED DIAGNOSES   Final diagnoses:  Sepsis due to  COVID-19 [facility])  Weakness     Rx / DC Orders   ED Discharge Orders     None        Note:  This document was prepared using Dragon voice recognition software and may include unintentional dictation errors.   Sharman Cheek, MD 06/09/23 [phone]

## 2023-06-08 NOTE — ED Notes (Signed)
Lactic 2.7 reported from lab. MD notified.

## 2023-06-08 NOTE — ED Triage Notes (Signed)
Pt arrived via [facility] from home with reports of weakness, vomiting, pt given zofran with EMS, continues to vomit on arrival.  101.6 temp with EMS  Pt has hx of iron infusions and GI bleed.    Per EMS pt was diaphoretic on arrival.

## 2023-06-09 DIAGNOSIS — Z87891 Personal history of nicotine dependence: Secondary | ICD-10-CM | POA: Diagnosis not present

## 2023-06-09 DIAGNOSIS — I2510 Atherosclerotic heart disease of native coronary artery without angina pectoris: Secondary | ICD-10-CM | POA: Diagnosis present

## 2023-06-09 DIAGNOSIS — K219 Gastro-esophageal reflux disease without esophagitis: Secondary | ICD-10-CM | POA: Diagnosis present

## 2023-06-09 DIAGNOSIS — Z8052 Family history of malignant neoplasm of bladder: Secondary | ICD-10-CM | POA: Diagnosis not present

## 2023-06-09 DIAGNOSIS — R000 Tachycardia, unspecified: Secondary | ICD-10-CM | POA: Diagnosis present

## 2023-06-09 DIAGNOSIS — N400 Enlarged prostate without lower urinary tract symptoms: Secondary | ICD-10-CM | POA: Diagnosis present

## 2023-06-09 DIAGNOSIS — W06XXXA Fall from bed, initial encounter: Secondary | ICD-10-CM | POA: Diagnosis present

## 2023-06-09 DIAGNOSIS — I739 Peripheral vascular disease, unspecified: Secondary | ICD-10-CM | POA: Diagnosis present

## 2023-06-09 DIAGNOSIS — Z89421 Acquired absence of other right toe(s): Secondary | ICD-10-CM | POA: Diagnosis not present

## 2023-06-09 DIAGNOSIS — G629 Polyneuropathy, unspecified: Secondary | ICD-10-CM | POA: Diagnosis present

## 2023-06-09 DIAGNOSIS — E872 Acidosis, unspecified: Secondary | ICD-10-CM | POA: Diagnosis present

## 2023-06-09 DIAGNOSIS — U071 COVID-19: Secondary | ICD-10-CM

## 2023-06-09 DIAGNOSIS — A0839 Other viral enteritis: Secondary | ICD-10-CM

## 2023-06-09 DIAGNOSIS — Z833 Family history of diabetes mellitus: Secondary | ICD-10-CM | POA: Diagnosis not present

## 2023-06-09 DIAGNOSIS — I6523 Occlusion and stenosis of bilateral carotid arteries: Secondary | ICD-10-CM | POA: Diagnosis present

## 2023-06-09 DIAGNOSIS — I129 Hypertensive chronic kidney disease with stage 1 through stage 4 chronic kidney disease, or unspecified chronic kidney disease: Secondary | ICD-10-CM | POA: Diagnosis present

## 2023-06-09 DIAGNOSIS — R531 Weakness: Secondary | ICD-10-CM

## 2023-06-09 DIAGNOSIS — A4189 Other specified sepsis: Secondary | ICD-10-CM | POA: Diagnosis present

## 2023-06-09 DIAGNOSIS — F419 Anxiety disorder, unspecified: Secondary | ICD-10-CM | POA: Diagnosis present

## 2023-06-09 DIAGNOSIS — E785 Hyperlipidemia, unspecified: Secondary | ICD-10-CM | POA: Diagnosis present

## 2023-06-09 DIAGNOSIS — Z8042 Family history of malignant neoplasm of prostate: Secondary | ICD-10-CM | POA: Diagnosis not present

## 2023-06-09 DIAGNOSIS — A419 Sepsis, unspecified organism: Secondary | ICD-10-CM

## 2023-06-09 DIAGNOSIS — Z79899 Other long term (current) drug therapy: Secondary | ICD-10-CM | POA: Diagnosis not present

## 2023-06-09 DIAGNOSIS — Z8719 Personal history of other diseases of the digestive system: Secondary | ICD-10-CM

## 2023-06-09 DIAGNOSIS — R7989 Other specified abnormal findings of blood chemistry: Secondary | ICD-10-CM | POA: Diagnosis present

## 2023-06-09 DIAGNOSIS — I714 Abdominal aortic aneurysm, without rupture, unspecified: Secondary | ICD-10-CM | POA: Diagnosis present

## 2023-06-09 DIAGNOSIS — N1832 Chronic kidney disease, stage 3b: Secondary | ICD-10-CM | POA: Diagnosis present

## 2023-06-09 DIAGNOSIS — D509 Iron deficiency anemia, unspecified: Secondary | ICD-10-CM | POA: Diagnosis present

## 2023-06-09 LAB — CBC WITH DIFFERENTIAL/PLATELET
Abs Immature Granulocytes: 0.08 10*3/uL — ABNORMAL HIGH (ref 0.00–0.07)
Basophils Absolute: 0.1 10*3/uL (ref 0.0–0.1)
Basophils Relative: 1 %
Eosinophils Absolute: 0.1 10*3/uL (ref 0.0–0.5)
Eosinophils Relative: 1 %
HCT: 42.1 % (ref 39.0–52.0)
Hemoglobin: 13.1 g/dL (ref 13.0–17.0)
Immature Granulocytes: 1 %
Lymphocytes Relative: 3 %
Lymphs Abs: 0.3 10*3/uL — ABNORMAL LOW (ref 0.7–4.0)
MCH: 27.4 pg (ref 26.0–34.0)
MCHC: 31.1 g/dL (ref 30.0–36.0)
MCV: 88.1 fL (ref 80.0–100.0)
Monocytes Absolute: 0.8 10*3/uL (ref 0.1–1.0)
Monocytes Relative: 6 %
Neutro Abs: 10.6 10*3/uL — ABNORMAL HIGH (ref 1.7–7.7)
Neutrophils Relative %: 88 %
Platelets: 177 10*3/uL (ref 150–400)
RBC: 4.78 MIL/uL (ref 4.22–5.81)
RDW: 17.5 % — ABNORMAL HIGH (ref 11.5–15.5)
WBC: 12 10*3/uL — ABNORMAL HIGH (ref 4.0–10.5)
nRBC: 0 % (ref 0.0–0.2)

## 2023-06-09 LAB — CBC
HCT: 39 % (ref 39.0–52.0)
Hemoglobin: 12.2 g/dL — ABNORMAL LOW (ref 13.0–17.0)
MCH: 27.1 pg (ref 26.0–34.0)
MCHC: 31.3 g/dL (ref 30.0–36.0)
MCV: 86.5 fL (ref 80.0–100.0)
Platelets: 170 10*3/uL (ref 150–400)
RBC: 4.51 MIL/uL (ref 4.22–5.81)
RDW: 17.7 % — ABNORMAL HIGH (ref 11.5–15.5)
WBC: 8.6 10*3/uL (ref 4.0–10.5)
nRBC: 0 % (ref 0.0–0.2)

## 2023-06-09 LAB — CREATININE, SERUM
Creatinine, Ser: 1.44 mg/dL — ABNORMAL HIGH (ref 0.61–1.24)
GFR, Estimated: 48 mL/min — ABNORMAL LOW (ref >60)

## 2023-06-09 LAB — TROPONIN I (HIGH SENSITIVITY): Troponin I (High Sensitivity): 29 ng/L — ABNORMAL HIGH (ref <18)

## 2023-06-09 MED ORDER — IPRATROPIUM-ALBUTEROL 0.5-2.5 (3) MG/3ML IN SOLN
3.0000 mL | Freq: Four times a day (QID) | RESPIRATORY_TRACT | Status: DC
  Administered 2023-06-09: 3 mL via RESPIRATORY_TRACT
  Filled 2023-06-09: qty 3

## 2023-06-09 MED ORDER — NIRMATRELVIR/RITONAVIR (PAXLOVID) TABLET (RENAL DOSING)
2.0000 | ORAL_TABLET | Freq: Two times a day (BID) | ORAL | Status: DC
  Administered 2023-06-09 – 2023-06-11 (×5): 2 via ORAL
  Filled 2023-06-09: qty 20

## 2023-06-09 MED ORDER — ACETAMINOPHEN 650 MG RE SUPP: 650.0000 mg | Freq: Four times a day (QID) | RECTAL | Status: DC | PRN

## 2023-06-09 MED ORDER — LACTATED RINGERS IV SOLN
150.0000 mL/h | INTRAVENOUS | Status: DC
  Administered 2023-06-09: 150 mL/h via INTRAVENOUS

## 2023-06-09 MED ORDER — IPRATROPIUM-ALBUTEROL 0.5-2.5 (3) MG/3ML IN SOLN: 3.0000 mL | Freq: Four times a day (QID) | RESPIRATORY_TRACT | Status: DC | PRN

## 2023-06-09 MED ORDER — ASPIRIN 81 MG PO TBEC
81.0000 mg | DELAYED_RELEASE_TABLET | Freq: Every day | ORAL | Status: DC
  Administered 2023-06-09 – 2023-06-11 (×3): 81 mg via ORAL
  Filled 2023-06-09 (×3): qty 1

## 2023-06-09 MED ORDER — LACTATED RINGERS IV SOLN: INTRAVENOUS | Status: AC

## 2023-06-09 MED ORDER — ACETAMINOPHEN 325 MG PO TABS
650.0000 mg | ORAL_TABLET | Freq: Four times a day (QID) | ORAL | Status: DC | PRN
  Administered 2023-06-10 – 2023-06-11 (×2): 650 mg via ORAL
  Filled 2023-06-09 (×2): qty 2

## 2023-06-09 MED ORDER — PANTOPRAZOLE SODIUM 40 MG IV SOLR
40.0000 mg | INTRAVENOUS | Status: DC
  Administered 2023-06-09 – 2023-06-11 (×3): 40 mg via INTRAVENOUS
  Filled 2023-06-09 (×3): qty 10

## 2023-06-09 MED ORDER — GABAPENTIN 300 MG PO CAPS
300.0000 mg | ORAL_CAPSULE | Freq: Two times a day (BID) | ORAL | Status: DC
  Administered 2023-06-09 – 2023-06-11 (×6): 300 mg via ORAL
  Filled 2023-06-09 (×6): qty 1

## 2023-06-09 MED ORDER — ALPRAZOLAM 0.25 MG PO TABS
0.2500 mg | ORAL_TABLET | Freq: Every day | ORAL | Status: DC
  Administered 2023-06-09 – 2023-06-10 (×2): 0.25 mg via ORAL
  Filled 2023-06-09 (×2): qty 1

## 2023-06-09 MED ORDER — ONDANSETRON HCL 4 MG/2ML IJ SOLN: 4.0000 mg | Freq: Four times a day (QID) | INTRAMUSCULAR | Status: DC | PRN

## 2023-06-09 MED ORDER — PRAVASTATIN SODIUM 20 MG PO TABS
20.0000 mg | ORAL_TABLET | Freq: Every day | ORAL | Status: DC
  Administered 2023-06-09 – 2023-06-11 (×3): 20 mg via ORAL
  Filled 2023-06-09 (×3): qty 1

## 2023-06-09 MED ORDER — ENOXAPARIN SODIUM 40 MG/0.4ML IJ SOSY
40.0000 mg | PREFILLED_SYRINGE | INTRAMUSCULAR | Status: DC
  Administered 2023-06-09 – 2023-06-11 (×3): 40 mg via SUBCUTANEOUS
  Filled 2023-06-09 (×3): qty 0.4

## 2023-06-09 MED ORDER — ACETAMINOPHEN 500 MG PO TABS
1000.0000 mg | ORAL_TABLET | Freq: Once | ORAL | Status: AC
  Administered 2023-06-09: 1000 mg via ORAL
  Filled 2023-06-09: qty 2

## 2023-06-09 MED ORDER — ONDANSETRON HCL 4 MG PO TABS: 4.0000 mg | ORAL_TABLET | Freq: Four times a day (QID) | ORAL | Status: DC | PRN

## 2023-06-09 MED ORDER — ACETAMINOPHEN 500 MG PO TABS
1000.0000 mg | ORAL_TABLET | Freq: Every day | ORAL | Status: DC
  Administered 2023-06-09 – 2023-06-10 (×2): 1000 mg via ORAL
  Filled 2023-06-09 (×2): qty 2

## 2023-06-09 MED ORDER — TAMSULOSIN HCL 0.4 MG PO CAPS
0.4000 mg | ORAL_CAPSULE | Freq: Every day | ORAL | Status: DC
  Administered 2023-06-09 – 2023-06-11 (×3): 0.4 mg via ORAL
  Filled 2023-06-09 (×2): qty 1

## 2023-06-09 MED ORDER — BUDESONIDE 180 MCG/ACT IN AEPB
1.0000 | INHALATION_SPRAY | Freq: Two times a day (BID) | RESPIRATORY_TRACT | Status: DC
  Administered 2023-06-09 – 2023-06-11 (×3): 1 via RESPIRATORY_TRACT
  Filled 2023-06-09 (×2): qty 1

## 2023-06-09 MED ORDER — BUDESONIDE 0.25 MG/2ML IN SUSP
0.2500 mg | Freq: Two times a day (BID) | RESPIRATORY_TRACT | Status: DC
  Administered 2023-06-09: 0.25 mg via RESPIRATORY_TRACT
  Filled 2023-06-09 (×2): qty 2

## 2023-06-09 NOTE — Hospital Course (Signed)
HTN,CAD, CKD-3b, iron deficiency anemia on IV iron, AAA, ulcer of aorta, carotid artery stenosis, PVD no longer on Xarelto since her hospitalization from 7/1 to 7/5 for small intestinal GI bleeding seen on capsule endoscopy, who presents to the ED with vomiting and weakness.  Temperature was 101.6 with EMS. ED course and data review: Tmax 103.2 with pulse 111 respirations 30, BP 169/64 and O2 sat 95% on room air.  WBC 12,000 with lactic acid 2.7> 1.2 and procalcitonin less than 0.10.  COVID-positive.  Creatinine at baseline at 1.52.  Troponin 29, urinalysis unremarkable.Next 9 EKG, personally viewed and interpreted showing sinus at 78 Chest x-ray with no active disease Patient was treated with NS bolus and was given a dose of ceftriaxone Hospitalist consulted for admission.

## 2023-06-09 NOTE — Assessment & Plan Note (Signed)
Slightly elevated troponin at 29 but EKG nonacute and no complaints of chest pain Continue pravastatin and aspirin as tolerated

## 2023-06-09 NOTE — Assessment & Plan Note (Signed)
Renal function at baseline 

## 2023-06-09 NOTE — Assessment & Plan Note (Signed)
PVD Carotid artery stenosis Aortic ulcer On aspirin.  No longer on Xarelto Continue statins

## 2023-06-09 NOTE — Assessment & Plan Note (Addendum)
Sepsis Generalized weakness Patient presents with a 1 day onset of vomiting and protracted weakness, unable to get out of bed without assistance Tmax 103.2 with pulse 111 respirations 30 but normal O2 sat on room air WBC 12,000 with lactic acid 2.7-1.2 and procalcitonin less than 0.10 Onset of symptoms about 24 hours so patient will benefit from antivirals Pharmacy consulted for COVID antivirals Sepsis fluids given clear chest x-ray Antiemetics, IV Protonix Clear liquid diet as tolerated Other COVID supportive measures Airborne precautions

## 2023-06-09 NOTE — Evaluation (Signed)
Physical Therapy Evaluation Patient Details Name: Bryan Welch MRN: [id number] DOB: [birth year] Today's Date: 06/09/2023  History of Present Illness  Bryan Welch is a 87 y.o. male with medical history significant for  HTN,CAD, CKD-3b, iron deficiency anemia on IV iron, AAA, ulcer of aorta, carotid artery stenosis, PVD no longer on Xarelto since her hospitalization from 7/1 to 7/5 for small intestinal GI bleeding seen on capsule endoscopy, who presents to the ED with vomiting and weakness to where he needed assistance to get out of bed.  Temperature was 101.6 with EMS.   Clinical Impression  Patient received in bed, he is pleasant and motivated to get up. He does not report sob. Patient just feels weak. He required min A for bed mobility (trunk elevation). Min guard for sit to stand and min guard for ambulation 12 feet in room. Patient needing B UE support moving around the room and feels warm to the touch. He will continue to benefit from skilled PT to improve endurance and strength for safe mobility.         If plan is discharge home, recommend the following: A little help with walking and/or transfers;A little help with bathing/dressing/bathroom;Assist for transportation;Help with stairs or ramp for entrance   Can travel by private vehicle        Equipment Recommendations Rolling walker (2 wheels)  Recommendations for Other Services       Functional Status Assessment Patient has had a recent decline in their functional status and demonstrates the ability to make significant improvements in function in a reasonable and predictable amount of time.     Precautions / Restrictions Precautions Precautions: Fall Restrictions Weight Bearing Restrictions: No      Mobility  Bed Mobility Overal bed mobility: Needs Assistance Bed Mobility: Supine to Sit     Supine to sit: Min assist     General bed mobility comments: min A to raise trunk    Transfers Overall transfer  level: Needs assistance Equipment used: None Transfers: Sit to/from Stand Sit to Stand: Min guard                Ambulation/Gait Ambulation/Gait assistance: Min guard Gait Distance (Feet): 12 Feet Assistive device: IV Pole Gait Pattern/deviations: Step-through pattern, Decreased step length - right, Decreased step length - left, Decreased stride length Gait velocity: decr     General Gait Details: patient weak with ambulation, requiring B UE support. (IV pole and bed). Will benefit from RW at next session.  Stairs            Wheelchair Mobility     Tilt Bed    Modified Rankin (Stroke Patients Only)       Balance Overall balance assessment: Needs assistance Sitting-balance support: Feet supported Sitting balance-Leahy Scale: Good     Standing balance support: Bilateral upper extremity supported, During functional activity Standing balance-Leahy Scale: Fair                               Pertinent Vitals/Pain Pain Assessment Pain Assessment: No/denies pain    Home Living Family/patient expects to be discharged to:: Private residence Living Arrangements: Spouse/significant other Available Help at Discharge: Family;Available 24 hours/day Type of Home: House Home Access: Stairs to enter Entrance Stairs-Rails: Doctor, general practice of Steps: 2   Home Layout: One level Home Equipment: Grab bars - toilet;Other (comment);Gil[place] Mor - single point      Prior Function Prior Level of  Function : Independent/Modified Independent;Driving             Mobility Comments: using walking stick at baseline ADLs Comments: independent     Hand Dominance        Extremity/Trunk Assessment   Upper Extremity Assessment Upper Extremity Assessment: Generalized weakness    Lower Extremity Assessment Lower Extremity Assessment: Generalized weakness    Cervical / Trunk Assessment Cervical / Trunk Assessment: Normal  Communication    Communication: No difficulties  Cognition Arousal/Alertness: Awake/alert Behavior During Therapy: WFL for tasks assessed/performed Overall Cognitive Status: Within Functional Limits for tasks assessed                                          General Comments      Exercises     Assessment/Plan    PT Assessment Patient needs continued PT services  PT Problem List Decreased strength;Decreased activity tolerance;Decreased balance;Decreased mobility;Decreased knowledge of use of DME       PT Treatment Interventions DME instruction;Gait training;Stair training;Functional mobility training;Therapeutic activities;Patient/family education;Balance training;Therapeutic exercise    PT Goals (Current goals can be found in the Care Plan section)  Acute Rehab PT Goals Patient Stated Goal: to feel better, return home PT Goal Formulation: With patient Time For Goal Achievement: 06/23/23 Potential to Achieve Goals: Good    Frequency Min 1X/week     Co-evaluation               AM-PAC PT "6 Clicks" Mobility  Outcome Measure Help needed turning from your back to your side while in a flat bed without using bedrails?: A Little Help needed moving from lying on your back to sitting on the side of a flat bed without using bedrails?: A Little Help needed moving to and from a bed to a chair (including a wheelchair)?: A Little Help needed standing up from a chair using your arms (e.g., wheelchair or bedside chair)?: A Little Help needed to walk in hospital room?: A Little Help needed climbing 3-5 steps with a railing? : A Lot 6 Click Score: 17    End of Session   Activity Tolerance: Patient limited by fatigue Patient left: in chair;with call bell/phone within reach;with chair alarm set Nurse Communication: Mobility status PT Visit Diagnosis: Muscle weakness (generalized) (M62.81);Difficulty in walking, not elsewhere classified (R26.2);Other abnormalities of gait and  mobility (R26.89);Unsteadiness on feet (R26.81)    Time: 2536-6440 PT Time Calculation (min) (ACUTE ONLY): 19 min   Charges:   PT Evaluation $PT Eval Moderate Complexity: 1 Mod PT Treatments $Gait Training: 8-22 mins PT General Charges $$ ACUTE PT VISIT: 1 Visit          , PT, GCS 06/09/23,2:31 PM

## 2023-06-09 NOTE — TOC Progression Note (Signed)
Transition of Care [facility]) - Progression Note    Patient Details  Name: Bryan Welch MRN: [id number] Date of Birth: April 17, 1936  [facility] CM/SW Contact  Marlowe Sax, RN Phone Number: 06/09/2023, 9:27 AM  Clinical Narrative:      Pt lives at home with wife. Pt active with dr Laural Benes for primary care. Pt had EGD on 7/4. Pt reports no transportation needs or SDOH needs at this time.   Has had [facility] for HH previously, will be assessed for [facility] need during this stay as well TOC to continue to follow and assist with DC planning and needs  Expected Discharge Plan: Home w Home Health Services Barriers to Discharge: Continued Medical Work up  Expected Discharge Plan and Services   Discharge Planning Services: CM Consult   Living arrangements for the past 2 months: Single Family Home                                       Social Determinants of Health (SDOH) Interventions SDOH Screenings   Food Insecurity: No Food Insecurity (06/09/2023)  Housing: Patient Declined (06/09/2023)  Transportation Needs: No Transportation Needs (06/09/2023)  Utilities: Not At Risk (06/09/2023)  Depression (PHQ2-9): Low Risk  (04/21/2023)  Tobacco Use: Medium Risk (06/08/2023)    Readmission Risk Interventions    05/11/2023   12:24 PM  Readmission Risk Prevention Plan  Transportation Screening Complete  PCP or Specialist Appt within 5-7 Days Complete  Home Care Screening Complete  Medication Review (RN CM) Complete

## 2023-06-09 NOTE — H&P (Signed)
History and Physical    [name] FAO:[id number] DOB: [birth year] DOA: 06/08/2023 DOS: the patient was seen and examined on 06/09/2023 PCP: Gracelyn Nurse, MD  Patient coming from: Home  Chief Complaint:  Chief Complaint  Patient presents with   Weakness   Emesis    HPI: Bryan Welch is a 87 y.o. male with medical history significant for  HTN,CAD, CKD-3b, iron deficiency anemia on IV iron, AAA, ulcer of aorta, carotid artery stenosis, PVD no longer on Xarelto since her hospitalization from 7/1 to 7/5 for small intestinal GI bleeding seen on capsule endoscopy, who presents to the ED with vomiting and weakness to where he needed assistance to get out of bed.  Temperature was 101.6 with EMS.  He had no diarrhea, cough, abdominal pain or shortness of breath.  No blood in the stool or black stool. ED course and data review: Tmax 103.2 with pulse 111 respirations 30, BP 169/64 and O2 sat 95% on room air.  WBC 12,000 with lactic acid 2.7> 1.2 and procalcitonin less than 0.10.  COVID-positive.  Creatinine at baseline at 1.52.  Troponin 29, urinalysis unremarkable. EKG, personally viewed and interpreted showing sinus at 78 Chest x-ray with no active disease Patient was treated with NS bolus and was given a dose of ceftriaxone Hospitalist consulted for admission.     Past Medical History:  Diagnosis Date   Arthritis    Toes   Coronary artery disease    History of hiatal hernia    Hyperlipidemia    Hypertension    Kidney disorder    "right kidney doesn't work"   Presence of stent in artery    "4 in legs, 1 in neck"   Vascular abnormality    Wears dentures    full upper and lower   Past Surgical History:  Procedure Laterality Date   AMPUTATION TOE Right 09/16/2021   Procedure: AMPUTATION TOE;  Surgeon: Rosetta Posner, DPM;  Location: [facility];  Service: Podiatry;  Laterality: Right;   BACK SURGERY     CAROTID STENT Right    CATARACT EXTRACTION W/PHACO  Left 06/17/2019   Procedure: CATARACT EXTRACTION PHACO AND INTRAOCULAR LENS PLACEMENT (IOC) LEFT;  Surgeon: Nevada Crane, MD;  Location: [facility];  Service: Ophthalmology;  Laterality: Left;   CATARACT EXTRACTION W/PHACO Right 07/08/2019   Procedure: CATARACT EXTRACTION PHACO AND INTRAOCULAR LENS PLACEMENT (IOC) right  00:39.2  10.8%  5.02  ;  Surgeon: Nevada Crane, MD;  Location: [facility];  Service: Ophthalmology;  Laterality: Right;   COLONOSCOPY WITH PROPOFOL N/A 04/13/2023   Procedure: COLONOSCOPY WITH PROPOFOL;  Surgeon: Jaynie Collins, DO;  Location: [facility] ENDOSCOPY;  Service: Gastroenterology;  Laterality: N/A;   ENTEROSCOPY N/A 05/10/2023   Procedure: ENTEROSCOPY;  Surgeon: Midge Minium, MD;  Location: [facility] ENDOSCOPY;  Service: Endoscopy;  Laterality: N/A;   ESOPHAGOGASTRODUODENOSCOPY (EGD) WITH PROPOFOL N/A 04/13/2023   Procedure: ESOPHAGOGASTRODUODENOSCOPY (EGD) WITH PROPOFOL;  Surgeon: Jaynie Collins, DO;  Location: [facility] ENDOSCOPY;  Service: Gastroenterology;  Laterality: N/A;   EYE SURGERY     GIVENS CAPSULE STUDY N/A 05/09/2023   Procedure: GIVENS CAPSULE STUDY;  Surgeon: Midge Minium, MD;  Location: [facility] ENDOSCOPY;  Service: Endoscopy;  Laterality: N/A;   HOT HEMOSTASIS  05/10/2023   Procedure: HOT HEMOSTASIS (ARGON PLASMA COAGULATION/BICAP);  Surgeon: Midge Minium, MD;  Location: [facility] ENDOSCOPY;  Service: Endoscopy;;   LEG SURGERY Bilateral    stent placement   LOWER EXTREMITY ANGIOGRAPHY Right 08/19/2021  Procedure: LOWER EXTREMITY ANGIOGRAPHY;  Surgeon: Annice Needy, MD;  Location: [facility] INVASIVE CV LAB;  Service: Cardiovascular;  Laterality: Right;   PERIPHERAL VASCULAR BALLOON ANGIOPLASTY Right 09/21/2017   Procedure: PERIPHERAL VASCULAR BALLOON ANGIOPLASTY;  Surgeon: Annice Needy, MD;  Location: [facility] INVASIVE CV LAB;  Service: Cardiovascular;  Laterality: Right;   REVERSE SHOULDER ARTHROPLASTY Right 11/26/2019   Procedure: REVERSE  SHOULDER ARTHROPLASTY;  Surgeon: Christena Flake, MD;  Location: [facility] ORS;  Service: Orthopedics;  Laterality: Right;   Social History:  reports that he quit smoking about 23 years ago. His smoking use included cigarettes. He has never used smokeless tobacco. He reports that he does not currently use alcohol. He reports that he does not use drugs.  Allergies  Allergen Reactions   Codeine Itching    Family History  Problem Relation Age of Onset   Bladder Cancer Mother    Prostate cancer Father    Prostate cancer Brother    Diabetes Maternal Grandfather     Prior to Admission medications   Medication Sig Start Date End Date Taking? Authorizing Provider  ALPRAZolam (XANAX) 0.25 MG tablet Take 0.25 mg by mouth at bedtime. 09/07/17  Yes [provider]  gabapentin (NEURONTIN) 300 MG capsule Take 300 mg by mouth 2 (two) times daily. 12/27/21 05/18/24 Yes [provider]  hydrochlorothiazide (HYDRODIURIL) 25 MG tablet Take 1 tablet (25 mg total) by mouth daily. 03/28/21  Yes Triplett, Cari B, FNP  iron polysaccharides (NIFEREX) 150 MG capsule Take 1 capsule (150 mg total) by mouth daily. 05/12/23 11/08/23 Yes Gillis Santa, MD  potassium chloride (KLOR-CON) 10 MEQ tablet Take 10 mEq by mouth daily. 02/09/23  Yes [provider]  pravastatin (PRAVACHOL) 20 MG tablet Take 20 mg by mouth at bedtime. 08/30/16  Yes [provider]  tamsulosin (FLOMAX) 0.4 MG CAPS capsule Take 0.4 mg by mouth daily. 01/06/18  Yes [provider]  acetaminophen (TYLENOL) 500 MG tablet Take 1,000 mg by mouth at bedtime.    [provider]  aspirin EC 81 MG tablet Take 81 mg daily by mouth.     [provider]  Cholecalciferol 25 MCG (1000 UT) capsule Take 2,000 Units by mouth daily.    [provider]  cyanocobalamin 1000 MCG tablet Take 1 tablet (1,000 mcg total) by mouth daily. 05/13/23 08/11/23  Gillis Santa, MD  diphenhydrAMINE (BENADRYL) 25 mg capsule Take  25 mg at bedtime by mouth.     [provider]  Multiple Vitamins-Minerals (PRESERVISION AREDS 2 PO) Take 1 tablet by mouth 2 (two) times daily.    [provider]  mupirocin ointment (BACTROBAN) 2 % Apply 1 Application topically 3 (three) times daily. 03/30/23   [provider]  omeprazole (PRILOSEC) 20 MG capsule Take 20 mg by mouth at bedtime.    [provider]    Physical Exam: Vitals:   06/09/23 0100 06/09/23 0130 06/09/23 0200 06/09/23 0223  BP: (!) 125/47 (!) 113/52 (!) 113/43   Pulse: 98 95 93   Resp: 18 (!) 21 19   Temp:    100.3 F (37.9 C)  TempSrc:      SpO2: 100% 100% 100%   Weight:      Height:       Physical Exam Vitals and nursing note reviewed.  Constitutional:      General: He is not in acute distress.    Comments: Arn Medal elderly male but awake and alert  HENT:  Head: Normocephalic and atraumatic.  Cardiovascular:     Rate and Rhythm: Normal rate and regular rhythm.     Heart sounds: Normal heart sounds.  Pulmonary:     Effort: Pulmonary effort is normal.     Breath sounds: Normal breath sounds.  Abdominal:     Palpations: Abdomen is soft.     Tenderness: There is no abdominal tenderness.  Neurological:     Mental Status: Mental status is at baseline.     Labs on Admission: I have personally reviewed following labs and imaging studies  CBC: Recent Labs  Lab 06/08/23 2316  WBC 12.0*  NEUTROABS 10.6*  HGB 13.1  HCT 42.1  MCV 88.1  PLT 177   Basic Metabolic Panel: Recent Labs  Lab 06/08/23 2316  NA 137  K 3.7  CL 103  CO2 21*  GLUCOSE 149*  BUN 25*  CREATININE 1.52*  CALCIUM 8.3*   GFR: Estimated Creatinine Clearance: 32.6 mL/min (A) (by C-G formula based on SCr of 1.52 mg/dL (H)). Liver Function Tests: Recent Labs  Lab 06/08/23 2316  AST 30  ALT 14  ALKPHOS 87  BILITOT 0.4  PROT 7.6  ALBUMIN 4.0   No results for input(s): "LIPASE", "AMYLASE" in the last 168 hours. No results  for input(s): "AMMONIA" in the last 168 hours. Coagulation Profile: Recent Labs  Lab 06/08/23 2316  INR 1.2   Cardiac Enzymes: No results for input(s): "CKTOTAL", "CKMB", "CKMBINDEX", "TROPONINI" in the last 168 hours. BNP (last 3 results) No results for input(s): "PROBNP" in the last 8760 hours. HbA1C: No results for input(s): "HGBA1C" in the last 72 hours. CBG: No results for input(s): "GLUCAP" in the last 168 hours. Lipid Profile: No results for input(s): "CHOL", "HDL", "LDLCALC", "TRIG", "CHOLHDL", "LDLDIRECT" in the last 72 hours. Thyroid Function Tests: No results for input(s): "TSH", "T4TOTAL", "FREET4", "T3FREE", "THYROIDAB" in the last 72 hours. Anemia Panel: No results for input(s): "VITAMINB12", "FOLATE", "FERRITIN", "TIBC", "IRON", "RETICCTPCT" in the last 72 hours. Urine analysis:    Component Value Date/Time   COLORURINE YELLOW (A) 06/09/2023 0133   APPEARANCEUR CLEAR (A) 06/09/2023 0133   APPEARANCEUR Clear 02/09/2018 1036   LABSPEC 1.016 06/09/2023 0133   LABSPEC 1.014 03/13/2013 1403   PHURINE 5.0 06/09/2023 0133   GLUCOSEU NEGATIVE 06/09/2023 0133   GLUCOSEU Negative 03/13/2013 1403   HGBUR SMALL (A) 06/09/2023 0133   BILIRUBINUR NEGATIVE 06/09/2023 0133   BILIRUBINUR Negative 02/09/2018 1036   BILIRUBINUR Negative 03/13/2013 1403   KETONESUR NEGATIVE 06/09/2023 0133   PROTEINUR 30 (A) 06/09/2023 0133   NITRITE NEGATIVE 06/09/2023 0133   LEUKOCYTESUR NEGATIVE 06/09/2023 0133   LEUKOCYTESUR Negative 03/13/2013 1403    Radiological Exams on Admission: DG Chest 1 View  Result Date: 06/08/2023 CLINICAL DATA:  Weakness vomiting EXAM: CHEST  1 VIEW COMPARISON:  CT chest 03/22/2023 FINDINGS: Right-sided shoulder replacements. No acute airspace disease. Stable cardiomediastinal silhouette with aortic atherosclerosis. No pneumothorax IMPRESSION: No active disease. Electronically Signed   By: Jasmine Pang M.D.   On: 06/08/2023 23:51     Data  Reviewed: Relevant notes from primary care and specialist visits, past discharge summaries as available in EHR, including Care Everywhere. Prior diagnostic testing as pertinent to current admission diagnoses Updated medications and problem lists for reconciliation ED course, including vitals, labs, imaging, treatment and response to treatment Triage notes, nursing and pharmacy notes and ED provider's notes Notable results as noted in HPI   Assessment and Plan: Gastroenteritis due to COVID-19 virus Sepsis Generalized  weakness Patient presents with a 1 day onset of vomiting and protracted weakness, unable to get out of bed without assistance Tmax 103.2 with pulse 111 respirations 30 but normal O2 sat on room air WBC 12,000 with lactic acid 2.7-1.2 and procalcitonin less than 0.10 Onset of symptoms about 24 hours so patient will benefit from antivirals Pharmacy consulted for COVID antivirals Sepsis fluids given clear chest x-ray Antiemetics, IV Protonix Clear liquid diet as tolerated Other COVID supportive measures Airborne precautions  History of GI bleed Recent GI bleed 05/08/2023 with small intestinal GI bleed seen on capsule endoscopy Xarelto was discontinued related to GI bleed (was taken for PVD) Hemoglobin stable  CAD (coronary artery disease) Slightly elevated troponin at 29 but EKG nonacute and no complaints of chest pain Continue pravastatin and aspirin as tolerated  Chronic kidney disease, stage 3b (HCC) Renal function at baseline  HTN (hypertension) Blood pressure soft at 103/59 following admission so will hold off on antihypertensives and resume as appropriate  AAA (abdominal aortic aneurysm) without rupture (HCC) PVD Carotid artery stenosis Aortic ulcer On aspirin.  No longer on Xarelto Continue statins     DVT prophylaxis: Lovenox  Consults: none  Advance Care Planning:   Code Status: Prior   Family Communication: none  Disposition Plan: Back to  previous home environment  Severity of Illness: The appropriate patient status for this patient is INPATIENT. Inpatient status is judged to be reasonable and necessary in order to provide the required intensity of service to ensure the patient's safety. [name] presenting symptoms, physical exam findings, and initial radiographic and laboratory data in the context of their chronic comorbidities is felt to place them at high risk for further clinical deterioration. Furthermore, it is not anticipated that the patient will be medically stable for discharge from the hospital within 2 midnights of admission.   * I certify that at the point of admission it is my clinical judgment that the patient will require inpatient hospital care spanning beyond 2 midnights from the point of admission due to high intensity of service, high risk for further deterioration and high frequency of surveillance required.*  Author: Andris Baumann, MD 06/09/2023 2:48 AM  For on call review [email or web address].

## 2023-06-09 NOTE — Assessment & Plan Note (Signed)
Blood pressure soft at 103/59 following admission so will hold off on antihypertensives and resume as appropriate

## 2023-06-09 NOTE — Assessment & Plan Note (Addendum)
Recent GI bleed 05/08/2023 with small intestinal GI bleed seen on capsule endoscopy Xarelto was discontinued related to GI bleed (was taken for PVD) Hemoglobin stable

## 2023-06-09 NOTE — Progress Notes (Signed)
Progress Note   [name] DOB: 1936-07-05 DOA: 06/08/2023     0 DOS: the patient was seen and examined on 06/09/2023   Brief hospital course:  HTN,CAD, CKD-3b, iron deficiency anemia on IV iron, AAA, ulcer of aorta, carotid artery stenosis, PVD no longer on Xarelto since her hospitalization from 7/1 to 7/5 for small intestinal GI bleeding seen on capsule endoscopy, who presents to the ED with vomiting and weakness.  Temperature was 101.6 with EMS. ED course and data review: Tmax 103.2 with pulse 111 respirations 30, BP 169/64 and O2 sat 95% on room air.  WBC 12,000 with lactic acid 2.7> 1.2 and procalcitonin less than 0.10.  COVID-positive.  Creatinine at baseline at 1.52.  Troponin 29, urinalysis unremarkable.Next 9 EKG, personally viewed and interpreted showing sinus at 78 Chest x-ray with no active disease Patient was treated with NS bolus and was given a dose of ceftriaxone Hospitalist consulted for admission.  Assessment and Plan: Gastroenteritis due to COVID-19 virus, symptoms resolved Sepsis, resolved COVID and Generalized weakness Patient presents with a 1 day onset of vomiting and protracted weakness, unable to get out of bed without assistance Tmax 103.2 with pulse 111 respirations 30 but normal O2 sat on room air WBC 12,000 with lactic acid 2.7-1.2 and procalcitonin less than 0.10 Onset of symptoms about 24 hours so patient will benefit from antivirals Pharmacy consulted for COVID antivirals Sepsis fluids given clear chest x-ray Antiemetics, IV Protonix Clear liquid diet as tolerated Other COVID supportive measures Airborne precautions  - Continue Paxlovid. - Start Duo-Nebs q6h.   - UA clear, Bcx pending, procal low, CXR clear - no indication for antibiotics. - Monitor respiratory status.  Low threshold for CTA with PE protocol. - Encourage early mobilization and use of IS and valve.  History of GI bleed Recent GI bleed 05/08/2023 with small  intestinal GI bleed seen on capsule endoscopy Xarelto was discontinued related to GI bleed (was taken for PVD) Hemoglobin stable  CAD (coronary artery disease) Slightly elevated troponin at 29 but EKG nonacute and no complaints of chest pain Continue pravastatin and aspirin as tolerated  Chronic kidney disease, stage 3b (HCC) - Creatinine improved from 1.55 to 1.44 mg/dL consistent with baseline.  Lactic Acidosis, resolved - Lactate improved from 2.7 to 1.2 mmol/L.  Nausea: - Zofran PRN.  GERD:  - PPI.  HTN (hypertension) Blood pressure soft at 103/59 following admission so will hold off on antihypertensives and resume as appropriate  AAA (abdominal aortic aneurysm) without rupture (HCC) PVD Carotid artery stenosis Aortic ulcer On aspirin.  No longer on Xarelto Continue statins  - Continue Aspirin only.        Subjective:  Mr. Mullens is doing well. We discussed lower extremity exercises and getting out of the bed to chair with supervision. I encouraged incentive spirometry and valve use. He denies any fevers, chills, cough, chest pain, or shortness of breath.  Physical Exam: Vitals:   06/09/23 0420 06/09/23 0751 06/09/23 0847 06/09/23 1627  BP: (!) 106/43 (!) 116/47  103/72  Pulse: 81 79 78 83  Resp: 17 18 16 18   Temp: 98.4 F (36.9 C) 99.2 F (37.3 C)  99.7 F (37.6 C)  TempSrc:  Oral    SpO2: 95% 98% 98% 95%  Weight:      Height:      Physical Exam Constitutional:      General: He is in acute distress.  HENT:     Head: Normocephalic and atraumatic.  Mouth/Throat:     Mouth: Mucous membranes are dry.  Eyes:     Extraocular Movements: Extraocular movements intact.  Cardiovascular:     Rate and Rhythm: Normal rate and regular rhythm.     Pulses: Normal pulses.     Heart sounds: Normal heart sounds.  Pulmonary:     Effort: Pulmonary effort is normal.     Breath sounds: Normal breath sounds. No wheezing.  Abdominal:     General: There is  no distension.     Palpations: Abdomen is soft.     Tenderness: There is no abdominal tenderness.  Musculoskeletal:     Cervical back: Neck supple.  Skin:    General: Skin is dry.     Capillary Refill: Capillary refill takes less than 2 seconds.  Neurological:     General: No focal deficit present.  Psychiatric:        Mood and Affect: Mood normal.     Data Reviewed: Lab Results  Component Value Date   WBC 8.6 06/09/2023   HGB 12.2 (L) 06/09/2023   HCT 39.0 06/09/2023   MCV 86.5 06/09/2023   PLT 170 06/09/2023   Last metabolic panel Lab Results  Component Value Date   GLUCOSE 149 (H) 06/08/2023   NA 137 06/08/2023   K 3.7 06/08/2023   CL 103 06/08/2023   CO2 21 (L) 06/08/2023   BUN 25 (H) 06/08/2023   CREATININE 1.44 (H) 06/09/2023   GFRNONAA 48 (L) 06/09/2023   CALCIUM 8.3 (L) 06/08/2023   PHOS 3.5 05/12/2023   PROT 7.6 06/08/2023   ALBUMIN 4.0 06/08/2023   BILITOT 0.4 06/08/2023   ALKPHOS 87 06/08/2023   AST 30 06/08/2023   ALT 14 06/08/2023   ANIONGAP 13 06/08/2023     Family Communication: None.  Discussed with patient only.  Disposition: Status is: Inpatient Remains inpatient appropriate because: needs IV fluids  Planned Discharge Destination: Home    Time spent: >60 minutes  Author: Baldwin Jamaica, MD 06/09/2023 7:51 PM  For on call review [email or web address].

## 2023-06-10 DIAGNOSIS — U071 COVID-19: Secondary | ICD-10-CM | POA: Diagnosis not present

## 2023-06-10 MED ORDER — BENZONATATE 100 MG PO CAPS
200.0000 mg | ORAL_CAPSULE | Freq: Three times a day (TID) | ORAL | Status: DC | PRN
  Administered 2023-06-10 – 2023-06-11 (×2): 200 mg via ORAL
  Filled 2023-06-10 (×2): qty 2

## 2023-06-10 NOTE — Progress Notes (Signed)
PT Cancellation Note  Patient Details Name: JASHON ISHIDA MRN: [id number] DOB: 10-Apr-1936   Cancelled Treatment:     PT attempt. Pt refused at this time. " I've already been up walking with the other therapist. I just don't feel like it right now. Can you come back tomorrow?" Acute PT will continue to follow and progress per current POC.     Rushie Chestnut 06/10/2023, 2:25 PM

## 2023-06-10 NOTE — Evaluation (Signed)
Occupational Therapy Evaluation Patient Details Name: Bryan Welch MRN: [id number] DOB: 02-01-1936 Today's Date: 06/10/2023   History of Present Illness Bryan Welch is a 87 y.o. male with medical history significant for  HTN,CAD, CKD-3b, iron deficiency anemia on IV iron, AAA, ulcer of aorta, carotid artery stenosis, PVD no longer on Xarelto since her hospitalization from 7/1 to 7/5 for small intestinal GI bleeding seen on capsule endoscopy, who presents to the ED with vomiting and weakness to where he needed assistance to get out of bed.  Temperature was 101.6 with EMS.   Clinical Impression   Patient presenting with decreased Ind in self care, balance, functional mobility/transfers, endurance, and safety awareness. Patient reports living at home with wife and being Ind at baseline. Pt currently coughing often during session but on RA throughout. He reports feeling weak but better than yesterday. Patient currently functioning at supervision throughout with use of RW. He stands at sink for grooming tasks, ambulates 40' in room , and then utilized bathroom for BM. Pt able to perform clothing management, hygiene, and clothing management with supervision for safety.  Patient will benefit from acute OT to increase overall independence in the areas of ADLs, functional mobility, and safety awareness in order to safely discharge.     Recommendations for follow up therapy are one component of a multi-disciplinary discharge planning process, led by the attending physician.  Recommendations may be updated based on patient status, additional functional criteria and insurance authorization.   Assistance Recommended at Discharge None  Patient can return home with the following A little help with walking and/or transfers;A little help with bathing/dressing/bathroom;Assistance with cooking/housework;Assist for transportation;Help with stairs or ramp for entrance    Functional Status Assessment  Patient  has had a recent decline in their functional status and demonstrates the ability to make significant improvements in function in a reasonable and predictable amount of time.  Equipment Recommendations  None recommended by OT       Precautions / Restrictions Precautions Precautions: Fall Restrictions Weight Bearing Restrictions: No      Mobility Bed Mobility Overal bed mobility: Needs Assistance Bed Mobility: Supine to Sit, Sit to Supine     Supine to sit: Supervision Sit to supine: Supervision        Transfers Overall transfer level: Needs assistance Equipment used: Rolling walker (2 wheels) Transfers: Sit to/from Stand Sit to Stand: Supervision                  Balance Overall balance assessment: Needs assistance Sitting-balance support: Feet supported Sitting balance-Leahy Scale: Good     Standing balance support: Bilateral upper extremity supported, During functional activity, Reliant on assistive device for balance Standing balance-Leahy Scale: Fair                             ADL either performed or assessed with clinical judgement   ADL Overall ADL's : Needs assistance/impaired     Grooming: Wash/dry hands;Standing;Supervision/safety                   Toilet Transfer: Retail banker;Ambulation;Rolling walker (2 wheels)   Toileting- Clothing Manipulation and Hygiene: Supervision/safety;Sit to/from stand       Functional mobility during ADLs: Supervision/safety;Rolling walker (2 wheels)       Vision Patient Visual Report: No change from baseline              Pertinent Vitals/Pain Pain Assessment Pain Assessment: No/denies  pain     Hand Dominance Right   Extremity/Trunk Assessment Upper Extremity Assessment Upper Extremity Assessment: Overall WFL for tasks assessed   Lower Extremity Assessment Lower Extremity Assessment: Generalized weakness   Cervical / Trunk Assessment Cervical / Trunk  Assessment: Normal   Communication Communication Communication: No difficulties   Cognition Arousal/Alertness: Awake/alert Behavior During Therapy: WFL for tasks assessed/performed Overall Cognitive Status: Within Functional Limits for tasks assessed                                                  Home Living Family/patient expects to be discharged to:: Private residence Living Arrangements: Spouse/significant other Available Help at Discharge: Family;Available 24 hours/day Type of Home: House Home Access: Stairs to enter [employer] of Steps: 2 Entrance Stairs-Rails: Right;Left Home Layout: One level     Bathroom Shower/Tub: Walk-in shower         Home Equipment: Grab bars - toilet;Other (comment);Cane - single point          Prior Functioning/Environment Prior Level of Function : Independent/Modified Independent;Driving             Mobility Comments: using walking stick at baseline ADLs Comments: independent        OT Problem List: Decreased strength;Decreased activity tolerance;Decreased safety awareness;Impaired balance (sitting and/or standing);Decreased knowledge of use of DME or AE      OT Treatment/Interventions: Self-care/ADL training;Therapeutic exercise;Therapeutic activities;Energy conservation;DME and/or AE instruction;Patient/family education;Balance training    OT Goals(Current goals can be found in the care plan section) Acute Rehab OT Goals Patient Stated Goal: to go home OT Goal Formulation: With patient Time For Goal Achievement: 06/24/23 Potential to Achieve Goals: Fair ADL Goals Pt Will Perform Grooming: with modified independence;standing Pt Will Perform Upper Body Dressing: with modified independence;standing Pt Will Perform Lower Body Dressing: with modified independence;sit to/from stand Pt Will Transfer to Toilet: with modified independence;ambulating  OT Frequency: Min 1X/week       AM-PAC OT  "6 Clicks" Daily Activity     Outcome Measure Help from another person eating meals?: None Help from another person taking care of personal grooming?: None Help from another person toileting, which includes using toliet, bedpan, or urinal?: None Help from another person bathing (including washing, rinsing, drying)?: A Little Help from another person to put on and taking off regular upper body clothing?: None Help from another person to put on and taking off regular lower body clothing?: A Little 6 Click Score: 22   End of Session Equipment Utilized During Treatment: Rolling walker (2 wheels) Nurse Communication: Mobility status  Activity Tolerance: Patient tolerated treatment well Patient left: in bed;with call bell/phone within reach;with bed alarm set  OT Visit Diagnosis: Unsteadiness on feet (R26.81);Repeated falls (R29.6);Muscle weakness (generalized) (M62.81)                Time: 0981-1914 OT Time Calculation (min): 23 min Charges:  OT General Charges $OT Visit: 1 Visit OT Evaluation $OT Eval Low Complexity: 1 Low OT Treatments $Self Care/Home Management : 8-22 mins  Jackquline Denmark, MS, OTR/L , CBIS ascom [phone]  06/10/23, 11:55 AM

## 2023-06-10 NOTE — Progress Notes (Signed)
Progress Note   [name] WFU:[id number] DOB: 10-28-1936 DOA: 06/08/2023     1 DOS: the patient was seen and examined on 06/10/2023   Brief hospital course:  HTN,CAD, CKD-3b, iron deficiency anemia on IV iron, AAA, ulcer of aorta, carotid artery stenosis, PVD no longer on Xarelto since her hospitalization from 7/1 to 7/5 for small intestinal GI bleeding seen on capsule endoscopy, who presents to the ED with vomiting and weakness.  Temperature was 101.6 with EMS. ED course and data review: Tmax 103.2 with pulse 111 respirations 30, BP 169/64 and O2 sat 95% on room air.  WBC 12,000 with lactic acid 2.7> 1.2 and procalcitonin less than 0.10.  COVID-positive.  Creatinine at baseline at 1.52.  Troponin 29, urinalysis unremarkable.Next 9 EKG, personally viewed and interpreted showing sinus at 78 Chest x-ray with no active disease Patient was treated with NS bolus and was given a dose of ceftriaxone Hospitalist consulted for admission.  Assessment and Plan:  Acute Diarrhea, resolved: - Monitor.  COVID-19 virus with stable respiratory status: - This is Day 2 of Paxlovid. - Continue Duo-Nebs, incentive spirometry, early mobilization.  Patient is OOB in chair during the day. - Respiratory status is stable - no indication for steroids. - UA clear, Bcx show no growth to date, procalcitonin is low, CXR is clear - no indication for antibiotics.  Mild Cough: - Tessalon PRN.  Nausea: - Zofran PRN.  GERD:  - PPI.  Lactic Acidosis, resolved  CKD3 - Creatinine improved from 1.5 to 1.4 mg/dL consistent with baseline.  Iron Deficiency Anemia: 05/08/2023 small intestinal GI bleed on VCE: - HH is stable.  CAD: - Aspirin and Statin.  Peripheral Vascular Disease s/p right toe amputation: S/P 09/21/2017 RLE revascularization. - Aspirin and Statin.  Not on Eliquis due to history of 05/08/23 GI bleed.  Bilateral Carotid Artery Stenosis: - Aspirin and Statin.  Essential Hypertension:  BP is stable. - Monitor.  Hyperlipidemia: - Statin.  Neuropathic Pain: - Gabapentin 300 mg BID.  Anxiety: - Xanax 0.25 mg at bedtime.  BPH: - Flomax.  Diet: Cardiac DVT: Lovenox Dispo: Floors Discharge Plan: If respiratory status remains stable, patient can be discharged home tomorrow.  Will need rolling walker.  Subjective:  Bryan Welch is doing well. We discussed lower extremity exercises and getting out of the bed to chair with supervision. I encouraged incentive spirometry and valve use. He denies any fevers, chills, cough, chest pain, or shortness of breath.  Physical Exam: Vitals:   06/09/23 2355 06/10/23 0734 06/10/23 1142 06/10/23 1453  BP: (!) 122/47 (!) 140/51 (!) 123/57 (!) 130/49  Pulse: 74 72 72 68  Resp: 18 18 18 18   Temp: 99 F (37.2 C) 99.2 F (37.3 C) 98.6 F (37 C) 98.1 F (36.7 C)  TempSrc:  Oral Oral   SpO2: 90% 94% 96% 96%  Weight:      Height:      Physical Exam Constitutional:      General: He is in acute distress.  HENT:     Head: Normocephalic and atraumatic.     Mouth/Throat:     Mouth: Mucous membranes are dry.  Eyes:     Extraocular Movements: Extraocular movements intact.  Cardiovascular:     Rate and Rhythm: Normal rate and regular rhythm.     Pulses: Normal pulses.     Heart sounds: Normal heart sounds.  Pulmonary:     Effort: Pulmonary effort is normal.     Breath sounds: Normal  breath sounds. No wheezing.  Abdominal:     General: There is no distension.     Palpations: Abdomen is soft.     Tenderness: There is no abdominal tenderness.  Musculoskeletal:     Cervical back: Neck supple.  Skin:    General: Skin is dry.     Capillary Refill: Capillary refill takes less than 2 seconds.  Neurological:     General: No focal deficit present.  Psychiatric:        Mood and Affect: Mood normal.     Data Reviewed: Lab Results  Component Value Date   WBC 8.6 06/09/2023   HGB 12.2 (L) 06/09/2023   HCT 39.0 06/09/2023    MCV 86.5 06/09/2023   PLT 170 06/09/2023   Last metabolic panel Lab Results  Component Value Date   GLUCOSE 96 06/10/2023   NA 136 06/10/2023   K 3.7 06/10/2023   CL 104 06/10/2023   CO2 25 06/10/2023   BUN 22 06/10/2023   CREATININE 1.45 (H) 06/10/2023   GFRNONAA 48 (L) 06/10/2023   CALCIUM 7.9 (L) 06/10/2023   PHOS 3.5 05/12/2023   PROT 7.6 06/08/2023   ALBUMIN 4.0 06/08/2023   BILITOT 0.4 06/08/2023   ALKPHOS 87 06/08/2023   AST 30 06/08/2023   ALT 14 06/08/2023   ANIONGAP 7 06/10/2023     Family Communication: None.  Discussed with patient only.  Disposition: Status is: Inpatient Remains inpatient appropriate because: needs IV fluids  Planned Discharge Destination: Home    Time spent: >60 minutes  Author: Baldwin Jamaica, MD 06/10/2023 7:51 PM  For on call review [email or web address].

## 2023-06-10 NOTE — Plan of Care (Signed)
  Problem: Respiratory: Goal: Will maintain a patent airway Outcome: Progressing Goal: Complications related to the disease process, condition or treatment will be avoided or minimized Outcome: Progressing   Problem: Respiratory: Goal: Ability to maintain adequate ventilation will improve Outcome: Progressing   Problem: Clinical Measurements: Goal: Will remain free from infection Outcome: Progressing Goal: Respiratory complications will improve Outcome: Progressing Goal: Cardiovascular complication will be avoided Outcome: Progressing   Problem: Activity: Goal: Risk for activity intolerance will decrease Outcome: Progressing   Problem: Coping: Goal: Level of anxiety will decrease Outcome: Progressing   Problem: Safety: Goal: Ability to remain free from injury will improve Outcome: Progressing

## 2023-06-11 DIAGNOSIS — U071 COVID-19: Secondary | ICD-10-CM | POA: Diagnosis not present

## 2023-06-11 LAB — BASIC METABOLIC PANEL WITH GFR
Anion gap: 10 (ref 5–15)
BUN: 24 mg/dL — ABNORMAL HIGH (ref 8–23)
CO2: 25 mmol/L (ref 22–32)
Calcium: 8.3 mg/dL — ABNORMAL LOW (ref 8.9–10.3)
Chloride: 103 mmol/L (ref 98–111)
Creatinine, Ser: 1.56 mg/dL — ABNORMAL HIGH (ref 0.61–1.24)
GFR, Estimated: 44 mL/min — ABNORMAL LOW (ref >60)
Glucose, Bld: 78 mg/dL (ref 70–99)
Potassium: 3.7 mmol/L (ref 3.5–5.1)
Sodium: 138 mmol/L (ref 135–145)

## 2023-06-11 LAB — LACTIC ACID, PLASMA: Lactic Acid, Venous: 0.8 mmol/L (ref 0.5–1.9)

## 2023-06-11 MED ORDER — NIRMATRELVIR/RITONAVIR (PAXLOVID) TABLET (RENAL DOSING): 1.0000 | ORAL_TABLET | Freq: Two times a day (BID) | ORAL | 0 refills | Status: AC

## 2023-06-11 MED ORDER — AZITHROMYCIN 500 MG PO TABS: 500.0000 mg | ORAL_TABLET | Freq: Every day | ORAL | 0 refills | Status: AC

## 2023-06-11 MED ORDER — NIRMATRELVIR/RITONAVIR (PAXLOVID) TABLET (RENAL DOSING): 2.0000 | ORAL_TABLET | Freq: Two times a day (BID) | ORAL | 0 refills | Status: DC

## 2023-06-11 NOTE — TOC Transition Note (Signed)
Transition of Care [facility]) - CM/SW Discharge Note   Patient Details  Name: Bryan Welch MRN: [id number] Date of Birth: 1936-05-27  Transition of Care [facility]) CM/SW Contact:  Garret Reddish, RN Phone Number: 06/11/2023, 11:58 AM   Clinical Narrative:   Chart reviewed.  Noted that patient has orders for discharge today.    I have spoken with Bryan Welch.  He informs me that prior to admission he lived at home with his wife.  He reports that he has a rolling walker and a walking cane at home.    I have informed Bryan Welch that the PT department has recommended home health PT.  Bryan Welch did not have a home health preference.  I have asked Kandee Keen with [facility] to accept home health referral for home health PT.    I have informed staff nurse of the above information.      Final next level of care: Home w Home Health Services Barriers to Discharge: No Barriers Identified   Patient Goals and CMS Choice CMS Medicare.gov Compare Post Acute Care list provided to:: Patient Choice offered to / list presented to : Patient  Discharge Placement                      Patient and family notified of of transfer: 06/11/23  Discharge Plan and Services Additional resources added to the After Visit Summary for     Discharge Planning Services: CM Consult            DME Arranged:  (Patient has a rolling walker and walking cane at home.)         HH Arranged: PT HH Agency: [facility] Date [facility] Agency Contacted: 06/11/23 Time HH Agency Contacted: 1000 Representative spoke with at [facility] Agency: Kandee Keen  Social Determinants of Health (SDOH) Interventions SDOH Screenings   Food Insecurity: No Food Insecurity (06/09/2023)  Housing: Patient Declined (06/09/2023)  Transportation Needs: No Transportation Needs (06/09/2023)  Utilities: Not At Risk (06/09/2023)  Depression (PHQ2-9): Low Risk  (04/21/2023)  Tobacco Use: Medium Risk (06/08/2023)     Readmission Risk Interventions     05/11/2023   12:24 PM  Readmission Risk Prevention Plan  Transportation Screening Complete  PCP or Specialist Appt within 5-7 Days Complete  Home Care Screening Complete  Medication Review (RN CM) Complete

## 2023-06-11 NOTE — Plan of Care (Signed)
  Problem: Education: Goal: Knowledge of risk factors and measures for prevention of condition will improve Outcome: Progressing   Problem: Coping: Goal: Psychosocial and spiritual needs will be supported Outcome: Progressing   Problem: Respiratory: Goal: Will maintain a patent airway Outcome: Progressing Goal: Complications related to the disease process, condition or treatment will be avoided or minimized Outcome: Progressing   Problem: Fluid Volume: Goal: Hemodynamic stability will improve Outcome: Progressing   Problem: Clinical Measurements: Goal: Diagnostic test results will improve Outcome: Progressing Goal: Signs and symptoms of infection will decrease Outcome: Progressing   Problem: Respiratory: Goal: Ability to maintain adequate ventilation will improve Outcome: Progressing   Problem: Education: Goal: Knowledge of General Education information will improve Description: Including pain rating scale, medication(s)/side effects and non-pharmacologic comfort measures Outcome: Progressing   Problem: Health Behavior/Discharge Planning: Goal: Ability to manage health-related needs will improve Outcome: Progressing   Problem: Clinical Measurements: Goal: Ability to maintain clinical measurements within normal limits will improve Outcome: Progressing Goal: Will remain free from infection Outcome: Progressing Goal: Diagnostic test results will improve Outcome: Progressing Goal: Respiratory complications will improve Outcome: Progressing Goal: Cardiovascular complication will be avoided Outcome: Progressing   Problem: Activity: Goal: Risk for activity intolerance will decrease Outcome: Progressing   Problem: Nutrition: Goal: Adequate nutrition will be maintained Outcome: Progressing   Problem: Coping: Goal: Level of anxiety will decrease Outcome: Progressing   Problem: Elimination: Goal: Will not experience complications related to bowel motility Outcome:  Progressing Goal: Will not experience complications related to urinary retention Outcome: Progressing   Problem: Pain Managment: Goal: General experience of comfort will improve Outcome: Progressing   Problem: Safety: Goal: Ability to remain free from injury will improve Outcome: Progressing   Problem: Skin Integrity: Goal: Risk for impaired skin integrity will decrease Outcome: Progressing   

## 2023-06-11 NOTE — Discharge Summary (Addendum)
Physician Discharge Summary   [name] MRN: [id number] DOB: 09-Dec-1935  Admit date:     06/08/2023  Discharge date: 06/11/23  Discharge Physician: Baldwin Jamaica   PCP: Gracelyn Nurse, MD   Recommendations at discharge:   Please complete course of treatment with Paxlovid.  You have completed 5 doses in the hospital.  Continue 1 tablet BID for 5 days starting tomorrow. Please take Azithromycin 500 mg daily for 3 days for your cough. Please follow up with your regular primary care physician in one week to ensure resolution of symptoms. Please follow up with physical therapy outpatient to build your muscle endurance.  Discharge Diagnoses: Principal Problem:   Sepsis (HCC) Active Problems:   Gastroenteritis due to COVID-19 virus   Generalized weakness   Chronic kidney disease, stage 3b (HCC)   CAD (coronary artery disease)   History of GI bleed   HTN (hypertension)   AAA (abdominal aortic aneurysm) without rupture (HCC)   Atherosclerosis of native arteries of extremity with intermittent claudication (HCC)  Resolved Problems:   * No resolved hospital problems. *  Hospital Course:  87 year old male with PMH of Hypertension, Hyperlipidemia, Peripheral Vascular Disease s/p 09/21/2017 RLE revascularization (not on anticoagulation due 05/08/23 GI bleed), Chronic Kidney Disease Stage 3B, chronic iron deficiency anemia, and anxiety who presented to the ED with acute diarrhea and fevers found to be COVID positive.  WBC was 12K, Lactic Acid was 2.7 mmol/L.  Patient received fluids and rapidly improved after 24 hours.  Respiratory status was stable on room air.  Chest Xray was clear.    Assessment and Plan:  Acute Diarrhea in the setting of COVID infection, resolved: - Diarrhea resolved after 24 hours.  Lactic Acidosis, resolved with fluids:   COVID-19 virus with stable respiratory status: - Patient received 5 doses of Paxlovid 2 tablets daily.  He was discharged on 5 days  of Paxlovid 1 tablet daily to complete 10 day course. - Continue Duo-Nebs, incentive spirometry, early mobilization.   - Patient is OOB and uses rolling walker during the day.  Physical Therapy recommended outpatient physical therapy - referral was placed. - Respiratory status was stable on room air- no indication for steroids. - Urinalysis was clear, Blood Cultures showed no growth to date, Procalcitonin was low, Chest Xray was clear - no indication for antibiotics.   Mild Cough: - Patient was discharged on Azithromycin 500 mg x 3 days.   Nausea: - Zofran PRN.   GERD:  - PPI.   CKD3 - Creatinine was stable ~1.5 mg/dL consistent with baseline.   Chronic Iron Deficiency Anemia: 05/08/2023 small intestinal GI bleed on VCE: - HH was stable.   CAD: - Aspirin and Statin.   Peripheral Vascular Disease s/p right toe amputation and s/p 09/21/2017 RLE revascularization. - Aspirin and Statin.  Not on Eliquis due to history of 05/08/23 GI bleed.   Bilateral Carotid Artery Stenosis: - Aspirin and Statin.   Essential Hypertension: BP was stable throughout hospitalization. - Follow up with PCP to manage hypertension.   Hyperlipidemia: - Statin.   Neuropathic Pain: - Gabapentin 300 mg BID.   Anxiety: - Xanax 0.25 mg at bedtime.   BPH: - Flomax.   Consultants: None Procedures performed: None Disposition: Home Diet recommendation:  Discharge Diet Orders (From admission, onward)     Start     Ordered   06/11/23 0000  Diet - low sodium heart healthy        06/11/23 1130  06/11/23 0000  Diet - low sodium heart healthy        06/11/23 1158           Cardiac diet DISCHARGE MEDICATION: Allergies as of 06/11/2023       Reactions   Codeine Itching        Medication List     TAKE these medications    acetaminophen 500 MG tablet Commonly known as: TYLENOL Take 1,000 mg by mouth at bedtime.   ALPRAZolam 0.25 MG tablet Commonly known as: XANAX Take 0.25 mg by mouth  at bedtime.   aspirin EC 81 MG tablet Take 81 mg daily by mouth.   azithromycin 500 MG tablet Commonly known as: Zithromax Take 1 tablet (500 mg total) by mouth daily for 3 days.   Cholecalciferol 25 MCG (1000 UT) capsule Take 2,000 Units by mouth daily.   cyanocobalamin 1000 MCG tablet Take 1 tablet (1,000 mcg total) by mouth daily.   diphenhydrAMINE 25 mg capsule Commonly known as: BENADRYL Take 25 mg at bedtime by mouth.   gabapentin 300 MG capsule Commonly known as: NEURONTIN Take 300 mg by mouth 2 (two) times daily.   hydrochlorothiazide 25 MG tablet Commonly known as: HYDRODIURIL Take 1 tablet (25 mg total) by mouth daily.   iron polysaccharides 150 MG capsule Commonly known as: NIFEREX Take 1 capsule (150 mg total) by mouth daily.   mupirocin ointment 2 % Commonly known as: BACTROBAN Apply 1 Application topically 3 (three) times daily.   nirmatrelvir/ritonavir (renal dosing) 10 x 150 MG & 10 x 100MG  Tabs Commonly known as: PAXLOVID Take 1 tablet by mouth 2 (two) times daily for 5 days. Patient GFR is 44. Take nirmatrelvir (150 mg) one tablet twice daily for 5 days and ritonavir (100 mg) one tablet twice daily for 5 days. Start taking on: June 12, 2023   omeprazole 20 MG capsule Commonly known as: PRILOSEC Take 20 mg by mouth at bedtime.   potassium chloride 10 MEQ tablet Commonly known as: KLOR-CON Take 10 mEq by mouth daily.   pravastatin 20 MG tablet Commonly known as: PRAVACHOL Take 20 mg by mouth at bedtime.   PRESERVISION AREDS 2 PO Take 1 tablet by mouth 2 (two) times daily.   tamsulosin 0.4 MG Caps capsule Commonly known as: FLOMAX Take 0.4 mg by mouth daily.        Discharge Exam: Filed Weights   06/08/23 2307  Weight: 64.2 kg   Physical Exam Constitutional:      General: He is not in acute distress. HENT:     Head: Normocephalic and atraumatic.     Mouth/Throat:     Mouth: Mucous membranes are moist.  Eyes:     Extraocular  Movements: Extraocular movements intact.     Pupils: Pupils are equal, round, and reactive to light.  Cardiovascular:     Rate and Rhythm: Normal rate and regular rhythm.     Pulses: Normal pulses.     Heart sounds: Normal heart sounds.  Pulmonary:     Effort: Pulmonary effort is normal.     Breath sounds: Normal breath sounds.  Abdominal:     General: There is no distension.     Palpations: Abdomen is soft.     Tenderness: There is no abdominal tenderness.  Musculoskeletal:     Cervical back: Normal range of motion and neck supple.  Skin:    General: Skin is warm and dry.     Capillary Refill: Capillary refill takes less  than 2 seconds.  Neurological:     General: No focal deficit present.  Psychiatric:        Mood and Affect: Mood normal.     Condition at discharge: stable  The results of significant diagnostics from this hospitalization (including imaging, microbiology, ancillary and laboratory) are listed below for reference.   Imaging Studies: DG Chest 1 View  Result Date: 06/08/2023 CLINICAL DATA:  Weakness vomiting EXAM: CHEST  1 VIEW COMPARISON:  CT chest 03/22/2023 FINDINGS: Right-sided shoulder replacements. No acute airspace disease. Stable cardiomediastinal silhouette with aortic atherosclerosis. No pneumothorax IMPRESSION: No active disease. Electronically Signed   By: Jasmine Pang M.D.   On: 06/08/2023 23:51    Microbiology: Results for orders placed or performed during the hospital encounter of 06/08/23  SARS Coronavirus 2 by RT PCR (hospital order, performed in [facility] hospital lab) *cepheid single result test* Anterior Nasal Swab     Status: Abnormal   Collection Time: 06/08/23 11:16 PM   Specimen: Anterior Nasal Swab  Result Value Ref Range Status   SARS Coronavirus 2 by RT PCR POSITIVE (A) NEGATIVE Final    Comment: (NOTE) SARS-CoV-2 target nucleic acids are DETECTED  SARS-CoV-2 RNA is generally detectable in upper respiratory specimens  during the  acute phase of infection.  Positive results are indicative  of the presence of the identified virus, but do not rule out bacterial infection or co-infection with other pathogens not detected by the test.  Clinical correlation with patient history and  other diagnostic information is necessary to determine patient infection status.  The expected result is negative.  Fact Sheet for Patients:   RoadLapTop.co.za   Fact Sheet for Healthcare Providers:   http://kim-miller.com/    This test is not yet approved or cleared by the [place] FDA and  has been authorized for detection and/or diagnosis of SARS-CoV-2 by FDA under an Emergency Use Authorization (EUA).  This EUA will remain in effect (meaning this test can be used) for the duration of  the COVID-19 declaration under Section 564(b)(1)  of the Act, 21 U.S.C. section 360-bbb-3(b)(1), unless the authorization is terminated or revoked sooner.   Performed at [facility], [address] [place], [place] [address]   Culture, blood (Routine x 2)     Status: None (Preliminary result)   Collection Time: 06/08/23 11:32 PM   Specimen: BLOOD  Result Value Ref Range Status   Specimen Description BLOOD HAND  Final   Special Requests   Final    BOTTLES DRAWN AEROBIC AND ANAEROBIC Blood Culture adequate volume   Culture   Final    NO GROWTH 3 DAYS Performed at [facility], [address] [place], [place] [address]    Report Status PENDING  Incomplete  Culture, blood (Routine x 2)     Status: None (Preliminary result)   Collection Time: 06/08/23 11:32 PM   Specimen: BLOOD  Result Value Ref Range Status   Specimen Description BLOOD BLOOD LEFT ARM  Final   Special Requests   Final    BOTTLES DRAWN AEROBIC AND ANAEROBIC Blood Culture adequate volume   Culture   Final    NO GROWTH 3 DAYS Performed at [facility], [address], [place], [place] [address]     Report Status PENDING  Incomplete    Labs: CBC: Recent Labs  Lab 06/08/23 2316 06/09/23 0645  WBC 12.0* 8.6  NEUTROABS 10.6*  --   HGB 13.1 12.2*  HCT 42.1 39.0  MCV 88.1 86.5  PLT 177 170   Basic Metabolic Panel: Recent Labs  Lab 06/08/23 2316 06/09/23 0645 06/10/23 0408 06/11/23 0601  NA 137  --  136 138  K 3.7  --  3.7 3.7  CL 103  --  104 103  CO2 21*  --  25 25  GLUCOSE 149*  --  96 78  BUN 25*  --  22 24*  CREATININE 1.52* 1.44* 1.45* 1.56*  CALCIUM 8.3*  --  7.9* 8.3*   Liver Function Tests: Recent Labs  Lab 06/08/23 2316  AST 30  ALT 14  ALKPHOS 87  BILITOT 0.4  PROT 7.6  ALBUMIN 4.0    Discharge time spent: greater than 30 minutes.  Signed: Baldwin Jamaica, MD [facility] 06/11/2023

## 2023-06-11 NOTE — Plan of Care (Signed)
Problem: Education: Goal: Knowledge of risk factors and measures for prevention of condition will improve 06/11/2023 1250 by Dillard Essex, RN Outcome: Completed/Met 06/11/2023 1142 by Dillard Essex, RN Outcome: Progressing   Problem: Coping: Goal: Psychosocial and spiritual needs will be supported 06/11/2023 1250 by Dillard Essex, RN Outcome: Completed/Met 06/11/2023 1142 by Dillard Essex, RN Outcome: Progressing   Problem: Respiratory: Goal: Will maintain a patent airway 06/11/2023 1250 by Dillard Essex, RN Outcome: Completed/Met 06/11/2023 1142 by Dillard Essex, RN Outcome: Progressing Goal: Complications related to the disease process, condition or treatment will be avoided or minimized 06/11/2023 1250 by Dillard Essex, RN Outcome: Completed/Met 06/11/2023 1142 by Dillard Essex, RN Outcome: Progressing   Problem: Fluid Volume: Goal: Hemodynamic stability will improve 06/11/2023 1250 by Dillard Essex, RN Outcome: Completed/Met 06/11/2023 1142 by Dillard Essex, RN Outcome: Progressing   Problem: Clinical Measurements: Goal: Diagnostic test results will improve 06/11/2023 1250 by Dillard Essex, RN Outcome: Completed/Met 06/11/2023 1142 by Dillard Essex, RN Outcome: Progressing Goal: Signs and symptoms of infection will decrease 06/11/2023 1250 by Dillard Essex, RN Outcome: Completed/Met 06/11/2023 1142 by Dillard Essex, RN Outcome: Progressing   Problem: Respiratory: Goal: Ability to maintain adequate ventilation will improve 06/11/2023 1250 by Dillard Essex, RN Outcome: Completed/Met 06/11/2023 1142 by Dillard Essex, RN Outcome: Progressing   Problem: Education: Goal: Knowledge of General Education information will improve Description: Including pain rating scale, medication(s)/side effects and non-pharmacologic comfort measures 06/11/2023 1250 by Dillard Essex, RN Outcome: Completed/Met 06/11/2023 1142 by Dillard Essex, RN Outcome:  Progressing   Problem: Health Behavior/Discharge Planning: Goal: Ability to manage health-related needs will improve 06/11/2023 1250 by Dillard Essex, RN Outcome: Completed/Met 06/11/2023 1142 by Dillard Essex, RN Outcome: Progressing   Problem: Clinical Measurements: Goal: Ability to maintain clinical measurements within normal limits will improve 06/11/2023 1250 by Dillard Essex, RN Outcome: Completed/Met 06/11/2023 1142 by Dillard Essex, RN Outcome: Progressing Goal: Will remain free from infection 06/11/2023 1250 by Dillard Essex, RN Outcome: Completed/Met 06/11/2023 1142 by Dillard Essex, RN Outcome: Progressing Goal: Diagnostic test results will improve 06/11/2023 1250 by Dillard Essex, RN Outcome: Completed/Met 06/11/2023 1142 by Dillard Essex, RN Outcome: Progressing Goal: Respiratory complications will improve 06/11/2023 1250 by Dillard Essex, RN Outcome: Completed/Met 06/11/2023 1142 by Dillard Essex, RN Outcome: Progressing Goal: Cardiovascular complication will be avoided 06/11/2023 1250 by Dillard Essex, RN Outcome: Completed/Met 06/11/2023 1142 by Dillard Essex, RN Outcome: Progressing   Problem: Activity: Goal: Risk for activity intolerance will decrease 06/11/2023 1250 by Dillard Essex, RN Outcome: Completed/Met 06/11/2023 1142 by Dillard Essex, RN Outcome: Progressing   Problem: Nutrition: Goal: Adequate nutrition will be maintained 06/11/2023 1250 by Dillard Essex, RN Outcome: Completed/Met 06/11/2023 1142 by Dillard Essex, RN Outcome: Progressing   Problem: Coping: Goal: Level of anxiety will decrease 06/11/2023 1250 by Dillard Essex, RN Outcome: Completed/Met 06/11/2023 1142 by Dillard Essex, RN Outcome: Progressing   Problem: Elimination: Goal: Will not experience complications related to bowel motility 06/11/2023 1250 by Dillard Essex, RN Outcome: Completed/Met 06/11/2023 1142 by Dillard Essex, RN Outcome:  Progressing Goal: Will not experience complications related to urinary retention 06/11/2023 1250 by Dillard Essex, RN Outcome: Completed/Met 06/11/2023 1142 by Dillard Essex, RN Outcome: Progressing   Problem: Pain Managment: Goal: General experience of comfort will improve 06/11/2023 1250 by [place],  Karrie Doffing, RN Outcome: Completed/Met 06/11/2023 1142 by Dillard Essex, RN Outcome: Progressing   Problem: Safety: Goal: Ability to remain free from injury will improve 06/11/2023 1250 by Dillard Essex, RN Outcome: Completed/Met 06/11/2023 1142 by Dillard Essex, RN Outcome: Progressing   Problem: Skin Integrity: Goal: Risk for impaired skin integrity will decrease 06/11/2023 1250 by Dillard Essex, RN Outcome: Completed/Met 06/11/2023 1142 by Dillard Essex, RN Outcome: Progressing

## 2023-06-11 NOTE — Plan of Care (Signed)
  Problem: Education: Goal: Knowledge of risk factors and measures for prevention of condition will improve Outcome: Progressing   Problem: Coping: Goal: Psychosocial and spiritual needs will be supported Outcome: Progressing   Problem: Respiratory: Goal: Will maintain a patent airway Outcome: Progressing Goal: Complications related to the disease process, condition or treatment will be avoided or minimized Outcome: Progressing   Problem: Clinical Measurements: Goal: Signs and symptoms of infection will decrease Outcome: Progressing   Problem: Respiratory: Goal: Ability to maintain adequate ventilation will improve Outcome: Progressing   Problem: Education: Goal: Knowledge of General Education information will improve Description: Including pain rating scale, medication(s)/side effects and non-pharmacologic comfort measures Outcome: Progressing   Problem: Clinical Measurements: Goal: Respiratory complications will improve Outcome: Progressing Goal: Cardiovascular complication will be avoided Outcome: Progressing   Problem: Activity: Goal: Risk for activity intolerance will decrease Outcome: Progressing   Problem: Nutrition: Goal: Adequate nutrition will be maintained Outcome: Progressing   Problem: Coping: Goal: Level of anxiety will decrease Outcome: Progressing   Problem: Elimination: Goal: Will not experience complications related to bowel motility Outcome: Progressing   Problem: Pain Managment: Goal: General experience of comfort will improve Outcome: Progressing   Problem: Safety: Goal: Ability to remain free from injury will improve Outcome: Progressing   Problem: Skin Integrity: Goal: Risk for impaired skin integrity will decrease Outcome: Progressing

## 2023-06-11 NOTE — Progress Notes (Signed)
Patient discharged to home accompanied by son with all belongings. A+Ox4. VSS. Medications and discharge instructions reviewed. All questions answered. PIV x1 removed, no bleeding, intact. Patient verbalized understanding of signs and symptoms of infection.  Patient agreed to follow up with appointments as listed on AVS. Patient satisfied with overall care at [facility].

## 2023-07-04 ENCOUNTER — Other Ambulatory Visit: Payer: Self-pay

## 2023-07-04 ENCOUNTER — Emergency Department
Admission: EM | Admit: 2023-07-04 | Discharge: 2023-07-04 | Disposition: A | Discharge status: Home / Self Care | Payer: Medicare Other | Attending: Emergency Medicine | Admitting: Emergency Medicine

## 2023-07-04 DIAGNOSIS — E860 Dehydration: Secondary | ICD-10-CM | POA: Diagnosis not present

## 2023-07-04 DIAGNOSIS — I10 Essential (primary) hypertension: Secondary | ICD-10-CM | POA: Diagnosis not present

## 2023-07-04 DIAGNOSIS — R5383 Other fatigue: Secondary | ICD-10-CM | POA: Diagnosis present

## 2023-07-04 DIAGNOSIS — Z8616 Personal history of COVID-19: Secondary | ICD-10-CM | POA: Insufficient documentation

## 2023-07-04 LAB — CBC
HCT: 45.9 % (ref 39.0–52.0)
Hemoglobin: 14.3 g/dL (ref 13.0–17.0)
MCH: 27 pg (ref 26.0–34.0)
MCHC: 31.2 g/dL (ref 30.0–36.0)
MCV: 86.8 fL (ref 80.0–100.0)
Platelets: 223 10*3/uL (ref 150–400)
RBC: 5.29 MIL/uL (ref 4.22–5.81)
RDW: 16.7 % — ABNORMAL HIGH (ref 11.5–15.5)
WBC: 7.1 10*3/uL (ref 4.0–10.5)
nRBC: 0 % (ref 0.0–0.2)

## 2023-07-04 LAB — COMPREHENSIVE METABOLIC PANEL
ALT: 17 U/L (ref 0–44)
AST: 21 U/L (ref 15–41)
Albumin: 4 g/dL (ref 3.5–5.0)
Alkaline Phosphatase: 75 U/L (ref 38–126)
Anion gap: 11 (ref 5–15)
BUN: 30 mg/dL — ABNORMAL HIGH (ref 8–23)
CO2: 26 mmol/L (ref 22–32)
Calcium: 9.2 mg/dL (ref 8.9–10.3)
Chloride: 99 mmol/L (ref 98–111)
Creatinine, Ser: 1.53 mg/dL — ABNORMAL HIGH (ref 0.61–1.24)
GFR, Estimated: 45 mL/min — ABNORMAL LOW (ref >60)
Glucose, Bld: 99 mg/dL (ref 70–99)
Potassium: 3.6 mmol/L (ref 3.5–5.1)
Sodium: 136 mmol/L (ref 135–145)
Total Bilirubin: 0.6 mg/dL (ref 0.3–1.2)
Total Protein: 7.4 g/dL (ref 6.5–8.1)

## 2023-07-04 LAB — TYPE AND SCREEN
ABO/RH(D): O POS
Antibody Screen: NEGATIVE

## 2023-07-04 MED ORDER — LACTATED RINGERS IV BOLUS
1000.0000 mL | Freq: Once | INTRAVENOUS | Status: AC
  Administered 2023-07-04: 1000 mL via INTRAVENOUS

## 2023-07-04 NOTE — ED Triage Notes (Signed)
Pt here with rectal bleeding, right foot pain, and a headache. Pt stated bleeding started yesterday, black in color. Pt denies clots in the blood. Pt states his foot pain radiates up to his knee when walking, hx of an amputation of his 2nd toe.

## 2023-07-04 NOTE — ED Notes (Signed)
Remaining fluids infusing at this time. Pt called daughter for a ride home.

## 2023-07-04 NOTE — ED Provider Notes (Signed)
[facility] Provider Note    Event Date/Time   First MD Initiated Contact with Patient 07/04/23 1513     (approximate)   History   Chief Complaint: Rectal Bleeding   HPI  Bryan Welch is a 87 y.o. male with a history of hypertension who comes to the ED complaining of fatigue for the past 6 to [redacted] weeks along with an episode of black stool yesterday.  Reports having 1 bowel movement every other day.  Was recently on iron supplements.  No chest pain shortness of breath or fever, no abdominal pain.  Eating and drinking normally, but estimates he only drinks 16 ounces of water a day.  2 days ago he spent a few hours mowing grass in hot weather..  Patient reports that he has had a series of ailments over the past 2 months including COVID and a sinus infection requiring steroids and antibiotics and has not recovered his energy since those illnesses.     Physical Exam   Triage Vital Signs: ED Triage Vitals  Encounter Vitals Group     BP 07/04/23 1209 137/64     Systolic BP Percentile --      Diastolic BP Percentile --      Pulse Rate 07/04/23 1209 81     Resp 07/04/23 1209 16     Temp 07/04/23 1212 97.8 F (36.6 C)     Temp Source 07/04/23 1212 Oral     SpO2 07/04/23 1209 100 %     Weight 07/04/23 1209 141 lb 8.6 oz (64.2 kg)     Height 07/04/23 1209 5\' 6"  (1.676 m)     Head Circumference --      Peak Flow --      Pain Score 07/04/23 1209 8     Pain Loc --      Pain Education --      Exclude from Growth Chart --     Most recent vital signs: Vitals:   07/04/23 1700 07/04/23 1755  BP: (!) 173/64 (!) 176/66  Pulse: 72 74  Resp: 20 19  Temp:    SpO2: 100% 100%    General: Awake, no distress.  CV:  Good peripheral perfusion.  Regular rate and rhythm Resp:  Normal effort.  Clear to auscultation bilaterally Abd:  No distention.  Soft nontender.  Rectal exam reveals thin black stool, Hemoccult negative Other:  Dry oral mucosa   ED Results /  Procedures / Treatments   Labs (all labs ordered are listed, but only abnormal results are displayed) Labs Reviewed  COMPREHENSIVE METABOLIC PANEL - Abnormal; Notable for the following components:      Result Value   BUN 30 (*)    Creatinine, Ser 1.53 (*)    GFR, Estimated 45 (*)    All other components within normal limits  CBC - Abnormal; Notable for the following components:   RDW 16.7 (*)    All other components within normal limits  POC OCCULT BLOOD, ED  TYPE AND SCREEN     EKG Interpreted by me Sinus rhythm rate of 69.  Left axis, normal intervals.  Normal QRS ST segments and T waves.   RADIOLOGY    PROCEDURES:  Procedures   MEDICATIONS ORDERED IN ED: Medications  lactated ringers bolus 1,000 mL (1,000 mLs Intravenous New Bag/Given 07/04/23 1657)     IMPRESSION / MDM / ASSESSMENT AND PLAN / ED COURSE  I reviewed the triage vital signs and the nursing notes.  DDx: Dehydration, electrolyte abnormality, AKI, anemia  Patient's presentation is most consistent with acute presentation with potential threat to life or bodily function.  Patient presents with fatigue as well as concerned that he might be having a GI bleed.  He is on Xarelto and has a history of upper GI bleed.  Hemoglobin is 14, rectal exam is Hemoccult negative, stool not consistent with melena, vital signs are normal.  Clinically he is dehydrated.  Labs are reassuring.  Will give IV fluids and plan for outpatient follow-up.  Patient and family agree with this plan.   ----------------------------------------- 6:02 PM on 07/04/2023 ----------------------------------------- Orthostatics normal, feeling well stable for discharge      FINAL CLINICAL IMPRESSION(S) / ED DIAGNOSES   Final diagnoses:  Fatigue, unspecified type  Dehydration     Rx / DC Orders   ED Discharge Orders     None        Note:  This document was prepared using Dragon voice recognition software and may include  unintentional dictation errors.   Sharman Cheek, MD 07/04/23 [phone]

## 2023-07-04 NOTE — ED Notes (Signed)
Pt verbalizes understanding of discharge instructions. Opportunity for questioning and answers were provided. Pt discharged from ED to home with daughter.    

## 2023-07-21 ENCOUNTER — Other Ambulatory Visit (INDEPENDENT_AMBULATORY_CARE_PROVIDER_SITE_OTHER): Payer: Medicare Other

## 2023-07-21 ENCOUNTER — Encounter (INDEPENDENT_AMBULATORY_CARE_PROVIDER_SITE_OTHER): Payer: Medicare Other

## 2023-07-21 ENCOUNTER — Inpatient Hospital Stay (HOSPITAL_BASED_OUTPATIENT_CLINIC_OR_DEPARTMENT_OTHER): Payer: Medicare Other | Admitting: Internal Medicine

## 2023-07-21 ENCOUNTER — Ambulatory Visit (INDEPENDENT_AMBULATORY_CARE_PROVIDER_SITE_OTHER): Payer: Medicare Other | Admitting: Vascular Surgery

## 2023-07-21 ENCOUNTER — Inpatient Hospital Stay: Payer: Medicare Other

## 2023-07-21 ENCOUNTER — Inpatient Hospital Stay: Payer: Medicare Other | Attending: Internal Medicine

## 2023-07-21 VITALS — BP 110/60 | HR 77 | Temp 97.6°F | Wt 132.2 lb

## 2023-07-21 DIAGNOSIS — D509 Iron deficiency anemia, unspecified: Secondary | ICD-10-CM

## 2023-07-21 DIAGNOSIS — E538 Deficiency of other specified B group vitamins: Secondary | ICD-10-CM | POA: Insufficient documentation

## 2023-07-21 DIAGNOSIS — Z87891 Personal history of nicotine dependence: Secondary | ICD-10-CM | POA: Diagnosis not present

## 2023-07-21 LAB — CBC WITH DIFFERENTIAL/PLATELET
Abs Immature Granulocytes: 0.03 10*3/uL (ref 0.00–0.07)
Basophils Absolute: 0.1 10*3/uL (ref 0.0–0.1)
Basophils Relative: 1 %
Eosinophils Absolute: 0.4 10*3/uL (ref 0.0–0.5)
Eosinophils Relative: 6 %
HCT: 42.9 % (ref 39.0–52.0)
Hemoglobin: 13.5 g/dL (ref 13.0–17.0)
Immature Granulocytes: 0 %
Lymphocytes Relative: 13 %
Lymphs Abs: 0.9 10*3/uL (ref 0.7–4.0)
MCH: 27.7 pg (ref 26.0–34.0)
MCHC: 31.5 g/dL (ref 30.0–36.0)
MCV: 88.1 fL (ref 80.0–100.0)
Monocytes Absolute: 0.5 10*3/uL (ref 0.1–1.0)
Monocytes Relative: 7 %
Neutro Abs: 5 10*3/uL (ref 1.7–7.7)
Neutrophils Relative %: 73 %
Platelets: 202 10*3/uL (ref 150–400)
RBC: 4.87 MIL/uL (ref 4.22–5.81)
RDW: 15.9 % — ABNORMAL HIGH (ref 11.5–15.5)
WBC: 6.9 10*3/uL (ref 4.0–10.5)
nRBC: 0 % (ref 0.0–0.2)

## 2023-07-21 LAB — IRON AND TIBC
Iron: 39 ug/dL — ABNORMAL LOW (ref 45–182)
Saturation Ratios: 14 % — ABNORMAL LOW (ref 17.9–39.5)
TIBC: 279 ug/dL (ref 250–450)
UIBC: 240 ug/dL

## 2023-07-21 LAB — FERRITIN: Ferritin: 97 ng/mL (ref 24–336)

## 2023-07-21 LAB — VITAMIN B12: Vitamin B-12: 824 pg/mL (ref 180–914)

## 2023-07-21 NOTE — Progress Notes (Signed)
Patient has been in and out of the hospital since his last visit, once due to Covid-19 related Sepsis. Then the most recent trip was dehydration. He is having some severe pain in his left toe beside of the big toe.

## 2023-07-21 NOTE — Progress Notes (Signed)
Bryan [facility]  [phone] [phone]  ID: [name] OB: 25-Sep-1936  MR#: [id number]  WUX#:[id number]  Patient Care Team: Gracelyn Nurse, MD as PCP - General (Internal Medicine)   REASON FOR REFERRAL: Iron deficiency anemia  HPI: Bryan [facility] is a 87 y.o. male with past medical history of CAD, hypertension, hyperlipidemia, PAD status post stents and right toe amputation, CKD stage III was referred to hematology for management of iron deficiency anemia.  Patient presented to ER on 03/22/2023 for generalized weakness and fatigue.  He noticed some blood in the stool about a week and a half ago.  Labs showed hemoglobin of 6.1, iron 13, saturation 3%, ferritin 5, folate 26, vitamin B12 182.  He received 1 unit of PRBC.  Repeat CBC from 03/28/2023 showed hemoglobin 9.2.   Was evaluated by Dr. Timothy Lasso of GI.  Colonoscopy done on 04/13/2023 showed 14 mm polypoid lesion at the ileocecal valve.  Had difficulty intubating TI.  One 1 to 2 mm polyp in the cecum.  111 to 12 mm polyp in ascending colon.  One 2 to 3 mm polyp in the descending colon.  Pathology showed tubular adenoma. Upper endoscopy showed gastritis and esophageal mucosal changes classified as Barrett's.  Pathology showed mild gastritis and Barrett's esophagus.  Interval history Patient was seen today as follow-up for iron deficiency anemia and labs. Reports had multiple hospitalization recently.  First had COVID and then dehydration.  Completed IV iron in July 2024.  Denies any bleeding.  He was supposed to follow-up with advanced GI at [facility] which he could not schedule due to frequent hospitalization.  Reports some pain in the right second toe.  Has history of PUD.   REVIEW OF SYSTEMS:   ROS  As per HPI. Otherwise, a complete review of systems is negative.  PAST MEDICAL HISTORY: Past Medical History:  Diagnosis Date   Arthritis    Toes   Coronary artery disease    History of hiatal  hernia    Hyperlipidemia    Hypertension    Kidney disorder    "right kidney doesn't work"   Presence of stent in artery    "4 in legs, 1 in neck"   Vascular abnormality    Wears dentures    full upper and lower    PAST SURGICAL HISTORY: Past Surgical History:  Procedure Laterality Date   AMPUTATION TOE Right 09/16/2021   Procedure: AMPUTATION TOE;  Surgeon: Rosetta Posner, DPM;  Location: [facility];  Service: Podiatry;  Laterality: Right;   BACK SURGERY     CAROTID STENT Right    CATARACT EXTRACTION W/PHACO Left 06/17/2019   Procedure: CATARACT EXTRACTION PHACO AND INTRAOCULAR LENS PLACEMENT (IOC) LEFT;  Surgeon: Nevada Crane, MD;  Location: [facility];  Service: Ophthalmology;  Laterality: Left;   CATARACT EXTRACTION W/PHACO Right 07/08/2019   Procedure: CATARACT EXTRACTION PHACO AND INTRAOCULAR LENS PLACEMENT (IOC) right  00:39.2  10.8%  5.02  ;  Surgeon: Nevada Crane, MD;  Location: [facility];  Service: Ophthalmology;  Laterality: Right;   COLONOSCOPY WITH PROPOFOL N/A 04/13/2023   Procedure: COLONOSCOPY WITH PROPOFOL;  Surgeon: Jaynie Collins, DO;  Location: [facility];  Service: Gastroenterology;  Laterality: N/A;   ENTEROSCOPY N/A 05/10/2023   Procedure: ENTEROSCOPY;  Surgeon: Midge Minium, MD;  Location: [facility]COPY;  Service: Endoscopy;  Laterality: N/A;   ESOPHAGOGASTRODUODENOSCOPY (EGD) WITH PROPOFOL N/A 04/13/2023   Procedure: ESOPHAGOGASTRODUODENOSCOPY (EGD) WITH PROPOFOL;  Surgeon: Timothy Lasso,  Thomas Hoff, DO;  Location: [facility] ENDOSCOPY;  Service: Gastroenterology;  Laterality: N/A;   EYE SURGERY     GIVENS CAPSULE STUDY N/A 05/09/2023   Procedure: GIVENS CAPSULE STUDY;  Surgeon: Midge Minium, MD;  Location: [facility];  Service: Endoscopy;  Laterality: N/A;   HOT HEMOSTASIS  05/10/2023   Procedure: HOT HEMOSTASIS (ARGON PLASMA COAGULATION/BICAP);  Surgeon: Midge Minium, MD;  Location: [facility] ENDOSCOPY;  Service: Endoscopy;;    LEG SURGERY Bilateral    stent placement   LOWER EXTREMITY ANGIOGRAPHY Right 08/19/2021   Procedure: LOWER EXTREMITY ANGIOGRAPHY;  Surgeon: Annice Needy, MD;  Location: [facility] INVASIVE CV LAB;  Service: Cardiovascular;  Laterality: Right;   PERIPHERAL VASCULAR BALLOON ANGIOPLASTY Right 09/21/2017   Procedure: PERIPHERAL VASCULAR BALLOON ANGIOPLASTY;  Surgeon: Annice Needy, MD;  Location: [facility] INVASIVE CV LAB;  Service: Cardiovascular;  Laterality: Right;   REVERSE SHOULDER ARTHROPLASTY Right 11/26/2019   Procedure: REVERSE SHOULDER ARTHROPLASTY;  Surgeon: Christena Flake, MD;  Location: [facility] ORS;  Service: Orthopedics;  Laterality: Right;    FAMILY HISTORY: Family History  Problem Relation Age of Onset   Bladder Cancer Mother    Prostate cancer Father    Prostate cancer Brother    Diabetes Maternal Grandfather     HEALTH MAINTENANCE: Social History   Tobacco Use   Smoking status: Former    Current packs/day: 0.00    Types: Cigarettes    Quit date: 2001    Years since quitting: 23.7   Smokeless tobacco: Never  Vaping Use   Vaping status: Never Used  Substance Use Topics   Alcohol use: Not Currently   Drug use: No     Allergies  Allergen Reactions   Codeine Itching    Current Outpatient Medications  Medication Sig Dispense Refill   acetaminophen (TYLENOL) 500 MG tablet Take 1,000 mg by mouth at bedtime.     ALPRAZolam (XANAX) 0.25 MG tablet Take 0.25 mg by mouth at bedtime.     aspirin EC 81 MG tablet Take 81 mg daily by mouth.      Cholecalciferol 25 MCG (1000 UT) capsule Take 2,000 Units by mouth daily.     cyanocobalamin 1000 MCG tablet Take 1 tablet (1,000 mcg total) by mouth daily. 90 tablet 0   diphenhydrAMINE (BENADRYL) 25 mg capsule Take 25 mg at bedtime by mouth.      gabapentin (NEURONTIN) 300 MG capsule Take 300 mg by mouth 2 (two) times daily.     hydrochlorothiazide (HYDRODIURIL) 25 MG tablet Take 1 tablet (25 mg total) by mouth daily. 30 tablet 0   Multiple  Vitamins-Minerals (PRESERVISION AREDS 2 PO) Take 1 tablet by mouth 2 (two) times daily.     mupirocin ointment (BACTROBAN) 2 % Apply 1 Application topically 3 (three) times daily.     omeprazole (PRILOSEC) 20 MG capsule Take 20 mg by mouth at bedtime.     potassium chloride (KLOR-CON) 10 MEQ tablet Take 10 mEq by mouth daily.     pravastatin (PRAVACHOL) 20 MG tablet Take 20 mg by mouth at bedtime.     tamsulosin (FLOMAX) 0.4 MG CAPS capsule Take 0.4 mg by mouth daily.  3   iron polysaccharides (NIFEREX) 150 MG capsule Take 1 capsule (150 mg total) by mouth daily. (Patient not taking: Reported on 07/21/2023) 90 capsule 1   No current facility-administered medications for this visit.    OBJECTIVE: Vitals:   07/21/23 1313  BP: 110/60  Pulse: 77  Temp: 97.6 F (36.4 C)  SpO2: 100%     Body mass index is 21.34 kg/m.      General: Well-developed, well-nourished, no acute distress. Eyes: Pink conjunctiva, anicteric sclera. HEENT: Normocephalic, moist mucous membranes, clear oropharnyx. Lungs: Clear to auscultation bilaterally. Heart: Regular rate and rhythm. No rubs, murmurs, or gallops. Abdomen: Soft, nontender, nondistended. No organomegaly noted, normoactive bowel sounds. Musculoskeletal: No edema, cyanosis, or clubbing. Neuro: Alert, answering all questions appropriately. Cranial nerves grossly intact. Skin: No rashes or petechiae noted. Psych: Normal affect. Lymphatics: No cervical, calvicular, axillary or inguinal LAD.   LAB RESULTS:  Lab Results  Component Value Date   NA 136 07/04/2023   K 3.6 07/04/2023   CL 99 07/04/2023   CO2 26 07/04/2023   GLUCOSE 99 07/04/2023   BUN 30 (H) 07/04/2023   CREATININE 1.53 (H) 07/04/2023   CALCIUM 9.2 07/04/2023   PROT 7.4 07/04/2023   ALBUMIN 4.0 07/04/2023   AST 21 07/04/2023   ALT 17 07/04/2023   ALKPHOS 75 07/04/2023   BILITOT 0.6 07/04/2023   GFRNONAA 45 (L) 07/04/2023   GFRAA 43 (L) 11/21/2019    Lab Results  Component  Value Date   WBC 6.9 07/21/2023   NEUTROABS 5.0 07/21/2023   HGB 13.5 07/21/2023   HCT 42.9 07/21/2023   MCV 88.1 07/21/2023   PLT 202 07/21/2023    Lab Results  Component Value Date   TIBC 279 07/21/2023   TIBC 414 05/08/2023   TIBC 454 (H) 03/22/2023   FERRITIN 97 07/21/2023   FERRITIN 54 05/08/2023   FERRITIN 5 (L) 03/22/2023   IRONPCTSAT 14 (L) 07/21/2023   IRONPCTSAT 5 (L) 05/08/2023   IRONPCTSAT 3 (L) 03/22/2023     STUDIES: No results found.  ASSESSMENT AND PLAN:   Bryan Welch is a 87 y.o. male with pmh of CAD, hypertension, hyperlipidemia, PAD status post stents and right toe amputation, CKD stage III was referred to hematology for management of iron deficiency anemia.  # Iron deficiency anemia -Patient presented to ER on 03/22/2023 for generalized weakness and fatigue.  He noticed some blood in the stool about a week and a half ago.  Labs showed hemoglobin of 6.1, iron 13, saturation 3%, ferritin 5, folate 26, vitamin B12 182. S/p 1 unit of PRBC.   0 Was evaluated by Dr. Timothy Lasso of GI.  Colonoscopy done on 04/13/2023 showed 14 mm polypoid lesion at the ileocecal valve.  Had difficulty intubating TI.  One 1 to 2 mm polyp in the cecum.  111 to 12 mm polyp in ascending colon.  One 2 to 3 mm polyp in the descending colon.  Pathology showed tubular adenoma. Upper endoscopy showed gastritis and esophageal mucosal changes classified as Barrett's.  Pathology showed mild gastritis and Barrett's esophagus.  Patient reports he is referred to GI at [facility] due to inability to intubate TI.  Could not schedule due to frequent hospitalizations.  -Could not tolerate oral iron due to diarrhea.  Completed IV Venofer in July 2024.  Hemoglobin has normalized now.  Iron panel is pending.  Will hold off on Venofer today.  # Vitamin B12 deficiency -B12 182.  Advised the patient to start B12 supplement 1000 mcg daily. -B12 level pending from today.  If still low, will consider IM  injections.  # PAD -Status post stents in bilateral lower extremity.  On Xarelto and aspirin  # CKD stage III -stable  Orders Placed This Encounter  Procedures   CBC with Differential/Platelet   Ferritin   Iron  and TIBC([facility])   Vitamin B12   RTC in 6 months for MD visit, labs, Venofer  Patient expressed understanding and was in agreement with this plan. He also understands that He can call clinic at any time with any questions, concerns, or complaints.   I spent a total of 25 minutes reviewing chart data, face-to-face evaluation with the patient, counseling and coordination of care as detailed above.  Michaelyn Barter, MD   07/21/2023 1:56 PM , CKD stage IIIa

## 2023-08-04 ENCOUNTER — Ambulatory Visit (INDEPENDENT_AMBULATORY_CARE_PROVIDER_SITE_OTHER): Payer: Medicare Other

## 2023-08-04 ENCOUNTER — Other Ambulatory Visit (INDEPENDENT_AMBULATORY_CARE_PROVIDER_SITE_OTHER): Payer: Self-pay | Admitting: Vascular Surgery

## 2023-08-04 ENCOUNTER — Ambulatory Visit (INDEPENDENT_AMBULATORY_CARE_PROVIDER_SITE_OTHER): Payer: Medicare Other | Admitting: Vascular Surgery

## 2023-08-04 ENCOUNTER — Telehealth (INDEPENDENT_AMBULATORY_CARE_PROVIDER_SITE_OTHER): Payer: Self-pay

## 2023-08-04 VITALS — BP 146/70 | HR 74 | Resp 19 | Ht 65.0 in | Wt 132.0 lb

## 2023-08-04 DIAGNOSIS — I70211 Atherosclerosis of native arteries of extremities with intermittent claudication, right leg: Secondary | ICD-10-CM

## 2023-08-04 DIAGNOSIS — I7143 Infrarenal abdominal aortic aneurysm, without rupture: Secondary | ICD-10-CM

## 2023-08-04 DIAGNOSIS — I6523 Occlusion and stenosis of bilateral carotid arteries: Secondary | ICD-10-CM

## 2023-08-04 DIAGNOSIS — R6889 Other general symptoms and signs: Secondary | ICD-10-CM | POA: Diagnosis not present

## 2023-08-04 DIAGNOSIS — E785 Hyperlipidemia, unspecified: Secondary | ICD-10-CM

## 2023-08-04 DIAGNOSIS — I10 Essential (primary) hypertension: Secondary | ICD-10-CM | POA: Diagnosis not present

## 2023-08-04 DIAGNOSIS — I7025 Atherosclerosis of native arteries of other extremities with ulceration: Secondary | ICD-10-CM

## 2023-08-04 NOTE — Assessment & Plan Note (Signed)
Duplex shows this to be stable at 4 cm in maximal diameter.  No immediate need for repair and right lower extremity revascularization needs to be done at this time to try to improve perfusion for wound healing.  This could be a source of embolic disease which could be an issue going forward.

## 2023-08-04 NOTE — Assessment & Plan Note (Signed)
Carotid duplex today shows stable 1 to 39% ICA stenosis bilaterally.  Almost a decade status post right carotid endarterectomy.  On Xarelto and aspirin.  Continue to follow annually.

## 2023-08-04 NOTE — Telephone Encounter (Signed)
Spoke with the patient and he is scheduled with Dr. Wyn Quaker for a right leg angio with a 6:45 am arrival time to the [facility]. Pre-procedure instructions were discussed and will be sent to Mychart and mailed.

## 2023-08-04 NOTE — H&P (View-Only) (Signed)
MRN : [id number]  Bryan Welch is a 87 y.o. (11/15/1935) male who presents with chief complaint of  Chief Complaint  Patient presents with   Venous Insufficiency  .  History of Present Illness: Patient returns today in follow up of multiple vascular issues.  His biggest issue currently is of an infected nonhealing ulceration on the right foot.  He has an extensive history of vascular disease and has undergone previous revascularization in years past.  He also has a known abdominal aortic aneurysm.  He has lost 1 toe on the right leg already.  He denies any fevers or chills or signs of systemic infection at this time.  His ABIs today have dropped dramatically on the right down to 0.37 from 0.7.  Duplex was done which shows a right SFA occlusion.  His left ABI is stable at 0.70. He is also followed for aneurysmal disease.  He has a known abdominal aortic aneurysm.  Duplex today shows this to be stable at 4 cm in maximal diameter. Finally, he is also followed for carotid disease.  No focal neurologic symptoms.  Carotid duplex today shows stable 1 to 39% ICA stenosis bilaterally.  Current Outpatient Medications  Medication Sig Dispense Refill   acetaminophen (TYLENOL) 500 MG tablet Take 1,000 mg by mouth at bedtime.     ALPRAZolam (XANAX) 0.25 MG tablet Take 0.25 mg by mouth at bedtime.     aspirin EC 81 MG tablet Take 81 mg daily by mouth.      Cholecalciferol 25 MCG (1000 UT) capsule Take 2,000 Units by mouth daily.     cyanocobalamin 1000 MCG tablet Take 1 tablet (1,000 mcg total) by mouth daily. 90 tablet 0   diphenhydrAMINE (BENADRYL) 25 mg capsule Take 25 mg at bedtime by mouth.      gabapentin (NEURONTIN) 300 MG capsule Take 300 mg by mouth 2 (two) times daily.     hydrochlorothiazide (HYDRODIURIL) 25 MG tablet Take 1 tablet (25 mg total) by mouth daily. 30 tablet 0   hydrochlorothiazide (HYDRODIURIL) 25 MG tablet Take 1 tablet by mouth daily.     Multiple Vitamins-Minerals  (PRESERVISION AREDS 2 PO) Take 1 tablet by mouth 2 (two) times daily.     omeprazole (PRILOSEC) 20 MG capsule Take 20 mg by mouth at bedtime.     potassium chloride (KLOR-CON) 10 MEQ tablet Take 10 mEq by mouth daily.     pravastatin (PRAVACHOL) 20 MG tablet Take 20 mg by mouth at bedtime.     tamsulosin (FLOMAX) 0.4 MG CAPS capsule Take 0.4 mg by mouth daily.  3   XARELTO 20 MG TABS tablet Take 20 mg by mouth daily.     No current facility-administered medications for this visit.    Past Medical History:  Diagnosis Date   Arthritis    Toes   Coronary artery disease    History of hiatal hernia    Hyperlipidemia    Hypertension    Kidney disorder    "right kidney doesn't work"   Presence of stent in artery    "4 in legs, 1 in neck"   Vascular abnormality    Wears dentures    full upper and lower    Past Surgical History:  Procedure Laterality Date   AMPUTATION TOE Right 09/16/2021   Procedure: AMPUTATION TOE;  Surgeon: Rosetta Posner, DPM;  Location: [facility];  Service: Podiatry;  Laterality: Right;   BACK SURGERY     CAROTID STENT Right  CATARACT EXTRACTION W/PHACO Left 06/17/2019   Procedure: CATARACT EXTRACTION PHACO AND INTRAOCULAR LENS PLACEMENT (IOC) LEFT;  Surgeon: Nevada Crane, MD;  Location: [facility];  Service: Ophthalmology;  Laterality: Left;   CATARACT EXTRACTION W/PHACO Right 07/08/2019   Procedure: CATARACT EXTRACTION PHACO AND INTRAOCULAR LENS PLACEMENT (IOC) right  00:39.2  10.8%  5.02  ;  Surgeon: Nevada Crane, MD;  Location: [facility];  Service: Ophthalmology;  Laterality: Right;   COLONOSCOPY WITH PROPOFOL N/A 04/13/2023   Procedure: COLONOSCOPY WITH PROPOFOL;  Surgeon: Jaynie Collins, DO;  Location: [facility] END[facility]COPY;  Service: Gastroenterology;  Laterality: N/A;   ENTEROSCOPY N/A 05/10/2023   Procedure: ENTEROSCOPY;  Surgeon: Midge Minium, MD;  Location: [facility];  Service: Endoscopy;  Laterality: N/A;    ESOPHAGOGASTRODUODENOSCOPY (EGD) WITH PROPOFOL N/A 04/13/2023   Procedure: ESOPHAGOGASTRODUODENOSCOPY (EGD) WITH PROPOFOL;  Surgeon: Jaynie Collins, DO;  Location: [facility] ENDOSCOPY;  Service: Gastroenterology;  Laterality: N/A;   EYE SURGERY     GIVENS CAPSULE STUDY N/A 05/09/2023   Procedure: GIVENS CAPSULE STUDY;  Surgeon: Midge Minium, MD;  Location: [facility] ENDOSCOPY;  Service: Endoscopy;  Laterality: N/A;   HOT HEMOSTASIS  05/10/2023   Procedure: HOT HEMOSTASIS (ARGON PLASMA COAGULATION/BICAP);  Surgeon: Midge Minium, MD;  Location: [facility] ENDOSCOPY;  Service: Endoscopy;;   LEG SURGERY Bilateral    stent placement   LOWER EXTREMITY ANGIOGRAPHY Right 08/19/2021   Procedure: LOWER EXTREMITY ANGIOGRAPHY;  Surgeon: Annice Needy, MD;  Location: [facility];  Service: Cardiovascular;  Laterality: Right;   PERIPHERAL VASCULAR BALLOON ANGIOPLASTY Right 09/21/2017   Procedure: PERIPHERAL VASCULAR BALLOON ANGIOPLASTY;  Surgeon: Annice Needy, MD;  Location: [facility];  Service: Cardiovascular;  Laterality: Right;   REVERSE SHOULDER ARTHROPLASTY Right 11/26/2019   Procedure: REVERSE SHOULDER ARTHROPLASTY;  Surgeon: Christena Flake, MD;  Location: [facility] ORS;  Service: Orthopedics;  Laterality: Right;     Social History   Tobacco Use   Smoking status: Former    Current packs/day: 0.00    Types: Cigarettes    Quit date: 2001    Years since quitting: 23.7   Smokeless tobacco: Never  Vaping Use   Vaping status: Never Used  Substance Use Topics   Alcohol use: Not Currently   Drug use: No      Family History  Problem Relation Age of Onset   Bladder Cancer Mother    Prostate cancer Father    Prostate cancer Brother    Diabetes Maternal Grandfather      Allergies  Allergen Reactions   Codeine Itching    REVIEW OF SYSTEMS (Negative unless checked)   Constitutional: [] Weight loss  [] Fever  [] Chills Cardiac: [] Chest pain   [] Chest pressure   [] Palpitations   [] Shortness  of breath when laying flat   [] Shortness of breath at rest   [x] Shortness of breath with exertion. Vascular:  [x] Pain in legs with walking   [] Pain in legs at rest   [] Pain in legs when laying flat   [x] Claudication   [] Pain in feet when walking  [] Pain in feet at rest  [] Pain in feet when laying flat   [] History of DVT   [] Phlebitis   [] Swelling in legs   [] Varicose veins   [x] Non-healing ulcers Pulmonary:   [] Uses home oxygen   [] Productive cough   [] Hemoptysis   [] Wheeze  [] COPD   [] Asthma Neurologic:  [] Dizziness  [] Blackouts   [] Seizures   [] History of stroke   [] History of TIA  []   Aphasia   [] Temporary blindness   [] Dysphagia   [] Weakness or numbness in arms   [] Weakness or numbness in legs Musculoskeletal:  [x] Arthritis   [] Joint swelling   [x] Joint pain   [] Low back pain Hematologic:  [] Easy bruising  [] Easy bleeding   [] Hypercoagulable state   [] Anemic   Gastrointestinal:  [] Blood in stool   [] Vomiting blood  [] Gastroesophageal reflux/heartburn   [] Abdominal pain Genitourinary:  [x] Chronic kidney disease   [] Difficult urination  [] Frequent urination  [] Burning with urination   [] Hematuria Skin:  [] Rashes   [x] Ulcers   [x] Wounds Psychological:  [] History of anxiety   []  History of major depression.  Physical Examination  BP (!) 146/70 (BP Location: Left Arm)   Pulse 74   Resp 19   Ht 5\' 5"  (1.651 m)   Wt 132 lb (59.9 kg)   BMI 21.97 kg/m  Gen:  WD/WN, NAD. Appears younger than stated age. Head: /AT, No temporalis wasting. Ear/Nose/Throat: Hearing diminished, nares w/o erythema or drainage Eyes: Conjunctiva clear. Sclera non-icteric Neck: Supple.  Trachea midline Pulmonary:  Good air movement, no use of accessory muscles.  Cardiac: RRR, no JVD Vascular:  Vessel Right Left  Radial Palpable Palpable                          PT Not Palpable 1+ Palpable  DP Not Palpable 1+ Palpable   Gastrointestinal: soft, non-tender/non-distended. No guarding/reflex.  Musculoskeletal:  M/S 5/5 throughout.  No deformity or atrophy.  Dressed ulcerations present on the right foot.  Trace lower extremity edema. Neurologic: Sensation grossly intact in extremities.  Symmetrical.  Speech is fluent.  Psychiatric: Judgment intact, Mood & affect appropriate for pt's clinical situation. Dermatologic: Right foot ulceration is present.      Labs Recent Results (from the past 2160 hour(s))  Reticulocytes     Status: Abnormal   Collection Time: 05/08/23  3:01 PM  Result Value Ref Range   Retic Ct Pct 5.7 (H) 0.4 - 3.1 %   RBC. 2.43 (L) 4.22 - 5.81 MIL/uL   Retic Count, Absolute 137.3 19.0 - 186.0 K/uL   Immature Retic Fract 40.1 (H) 2.3 - 15.9 %    Comment: Performed at [facility], [address] [place], [place] [address]  CBC     Status: Abnormal   Collection Time: 05/08/23  3:02 PM  Result Value Ref Range   WBC 5.9 4.0 - 10.5 K/uL   RBC 2.41 (L) 4.22 - 5.81 MIL/uL   Hemoglobin 5.9 (L) 13.0 - 17.0 g/dL   HCT 47.8 (L) 29.5 - 62.1 %   MCV 86.3 80.0 - 100.0 fL   MCH 24.5 (L) 26.0 - 34.0 pg   MCHC 28.4 (L) 30.0 - 36.0 g/dL   RDW 30.8 (H) 65.7 - 84.6 %   Platelets 304 150 - 400 K/uL   nRBC 0.0 0.0 - 0.2 %    Comment: Performed at [facility], [address] [place], [place] [address]  Type and screen [facility]     Status: None   Collection Time: 05/08/23  3:08 PM  Result Value Ref Range   ABO/RH(D) O POS    Antibody Screen NEG    Sample Expiration 05/11/2023,2359    Unit Number [id number]    Blood Component Type RBC, LR IRR    Unit division 00    Status of Unit ISSUED,FINAL    Transfusion Status OK TO TRANSFUSE    Crossmatch  Result Compatible    Unit Number [id number]    Blood Component Type RED CELLS,LR    Unit division 00    Status of Unit ISSUED,FINAL    Transfusion Status OK TO TRANSFUSE    Crossmatch Result      Compatible Performed at [facility], [address] [place], [place] [address]    BPAM RBC     Status: None   Collection Time: 05/08/23  3:08 PM  Result Value Ref Range   ISSUE DATE / TIME 884166063016    Blood Product Unit Number [id number]    PRODUCT CODE E0379V00    Unit Type and Rh 9500    Blood Product Expiration Date 025427062376    ISSUE DATE / TIME 283151761607    Blood Product Unit Number [id number]    PRODUCT CODE E7035K09    Unit Type and Rh 5100    Blood Product Expiration Date 381829937169   Magnesium     Status: None   Collection Time: 05/08/23  3:08 PM  Result Value Ref Range   Magnesium 1.8 1.7 - 2.4 mg/dL    Comment: Performed at [facility], [address] [place], [place] [address]  Folate     Status: None   Collection Time: 05/08/23  3:08 PM  Result Value Ref Range   Folate 12.1 >5.9 ng/mL    Comment: Performed at [facility], [address] [place], [place] [address]  Iron and TIBC     Status: Abnormal   Collection Time: 05/08/23  3:08 PM  Result Value Ref Range   Iron 22 (L) 45 - 182 ug/dL   TIBC 510 258 - 527 ug/dL   Saturation Ratios 5 (L) 17.9 - 39.5 %   UIBC 392 ug/dL    Comment: Performed at [facility], [address] [place], [place] [address]  Ferritin     Status: None   Collection Time: 05/08/23  3:08 PM  Result Value Ref Range   Ferritin 54 24 - 336 ng/mL    Comment: Performed at [facility], [address] [place], [place] [address]  Comprehensive metabolic panel     Status: Abnormal   Collection Time: 05/08/23  3:10 PM  Result Value Ref Range   Sodium 138 135 - 145 mmol/L   Potassium 3.4 (L) 3.5 - 5.1 mmol/L   Chloride 107 98 - 111 mmol/L   CO2 21 (L) 22 - 32 mmol/L   Glucose, Bld 126 (H) 70 - 99 mg/dL    Comment: Glucose reference range applies only to samples taken after fasting for at least 8 hours.   BUN 30 (H) 8 - 23 mg/dL   Creatinine, Ser 4.43 (H) 0.61 - 1.24 mg/dL   Calcium 8.2 (L) 8.9 - 10.3 mg/dL   Total Protein 6.7 6.5 - 8.1 g/dL   Albumin 3.9 3.5 -  5.0 g/dL   AST 18 15 - 41 U/L   ALT 9 0 - 44 U/L   Alkaline Phosphatase 54 38 - 126 U/L   Total Bilirubin 0.5 0.3 - 1.2 mg/dL   GFR, Estimated 41 (L) >60 mL/min    Comment: (NOTE) Calculated using the CKD-EPI Creatinine Equation (2021)    Anion gap 10 5 - 15    Comment: Performed at [facility], [address] [place], [place] [address]  Protime-INR     Status: Abnormal   Collection Time: 05/08/23  3:10 PM  Result Value Ref Range   Prothrombin Time  27.1 (H) 11.4 - 15.2 seconds   INR 2.5 (H) 0.8 - 1.2    Comment: (NOTE) INR goal varies based on device and disease states. Performed at [facility], [address] [place], [place] [address]   Prepare RBC (crossmatch)     Status: None   Collection Time: 05/08/23  4:00 PM  Result Value Ref Range   Order Confirmation      ORDER PROCESSED BY BLOOD BANK Performed at [facility], [address] [place], [place] [address]   APTT     Status: Abnormal   Collection Time: 05/08/23  9:37 PM  Result Value Ref Range   aPTT 40 (H) 24 - 36 seconds    Comment:        IF BASELINE aPTT IS ELEVATED, SUGGEST PATIENT RISK ASSESSMENT BE USED TO DETERMINE APPROPRIATE ANTICOAGULANT THERAPY. Performed at [facility], [address] [place], [place] [address]   CBC     Status: Abnormal   Collection Time: 05/08/23  9:37 PM  Result Value Ref Range   WBC 7.2 4.0 - 10.5 K/uL   RBC 2.78 (L) 4.22 - 5.81 MIL/uL   Hemoglobin 7.0 (L) 13.0 - 17.0 g/dL   HCT 32.4 (L) 40.1 - 02.7 %   MCV 84.2 80.0 - 100.0 fL   MCH 25.2 (L) 26.0 - 34.0 pg   MCHC 29.9 (L) 30.0 - 36.0 g/dL   RDW 25.3 (H) 66.4 - 40.3 %   Platelets 292 150 - 400 K/uL   nRBC 0.4 (H) 0.0 - 0.2 %    Comment: Performed at [facility], [address] [place], [place] [address]  Vitamin B12     Status: None   Collection Time: 05/08/23  9:37 PM  Result Value Ref Range   Vitamin B-12 380 180 - 914 pg/mL    Comment: (NOTE) This assay is  not validated for testing neonatal or myeloproliferative syndrome specimens for Vitamin B12 levels. Performed at [facility], [address], [place], [place] [address]   CBC     Status: Abnormal   Collection Time: 05/09/23  1:22 AM  Result Value Ref Range   WBC 8.0 4.0 - 10.5 K/uL   RBC 2.51 (L) 4.22 - 5.81 MIL/uL   Hemoglobin 6.4 (L) 13.0 - 17.0 g/dL   HCT 75.6 (L) 43.3 - 29.5 %   MCV 84.1 80.0 - 100.0 fL   MCH 25.5 (L) 26.0 - 34.0 pg   MCHC 30.3 30.0 - 36.0 g/dL   RDW 18.8 (H) 41.6 - 60.6 %   Platelets 279 150 - 400 K/uL   nRBC 0.3 (H) 0.0 - 0.2 %    Comment: Performed at [facility], [address], [place], [place] [address]  Basic metabolic panel     Status: Abnormal   Collection Time: 05/09/23  1:22 AM  Result Value Ref Range   Sodium 139 135 - 145 mmol/L   Potassium 3.7 3.5 - 5.1 mmol/L   Chloride 109 98 - 111 mmol/L   CO2 21 (L) 22 - 32 mmol/L   Glucose, Bld 91 70 - 99 mg/dL    Comment: Glucose reference range applies only to samples taken after fasting for at least 8 hours.   BUN 26 (H) 8 - 23 mg/dL   Creatinine, Ser 1.09 (H) 0.61 - 1.24 mg/dL   Calcium 7.9 (L) 8.9 - 10.3 mg/dL   GFR, Estimated 50 (L) >60 mL/min    Comment: (NOTE)  Calculated using the CKD-EPI Creatinine Equation (2021)    Anion gap 9 5 - 15    Comment: Performed at [facility], [address] [place], [place] [address]  Prepare RBC (crossmatch)     Status: None   Collection Time: 05/09/23  2:12 AM  Result Value Ref Range   Order Confirmation      ORDER PROCESSED BY BLOOD BANK Performed at [facility], [address] [place], [place] [address]   CBC     Status: Abnormal   Collection Time: 05/09/23  7:11 AM  Result Value Ref Range   WBC 6.8 4.0 - 10.5 K/uL   RBC 2.99 (L) 4.22 - 5.81 MIL/uL   Hemoglobin 7.8 (L) 13.0 - 17.0 g/dL   HCT 32.4 (L) 40.1 - 02.7 %   MCV 85.6 80.0 - 100.0 fL   MCH 26.1 26.0 - 34.0 pg   MCHC 30.5 30.0 - 36.0 g/dL   RDW 25.3  (H) 66.4 - 15.5 %   Platelets 272 150 - 400 K/uL   nRBC 0.0 0.0 - 0.2 %    Comment: Performed at [facility], [address] [place], [place] [address]  Glucose, capillary     Status: None   Collection Time: 05/09/23  7:29 AM  Result Value Ref Range   Glucose-Capillary 89 70 - 99 mg/dL    Comment: Glucose reference range applies only to samples taken after fasting for at least 8 hours.  CBC     Status: Abnormal   Collection Time: 05/09/23  1:12 PM  Result Value Ref Range   WBC 6.6 4.0 - 10.5 K/uL   RBC 2.95 (L) 4.22 - 5.81 MIL/uL   Hemoglobin 7.6 (L) 13.0 - 17.0 g/dL   HCT 42.5 (L) 95.6 - 38.7 %   MCV 85.8 80.0 - 100.0 fL   MCH 25.8 (L) 26.0 - 34.0 pg   MCHC 30.0 30.0 - 36.0 g/dL   RDW 56.4 (H) 33.2 - 95.1 %   Platelets 267 150 - 400 K/uL   nRBC 0.0 0.0 - 0.2 %    Comment: Performed at [facility], [address] [place], [place] [address]  CBC     Status: Abnormal   Collection Time: 05/09/23  7:33 PM  Result Value Ref Range   WBC 6.9 4.0 - 10.5 K/uL   RBC 3.04 (L) 4.22 - 5.81 MIL/uL   Hemoglobin 7.9 (L) 13.0 - 17.0 g/dL   HCT 60.6 (L) 30.1 - 60.1 %   MCV 85.2 80.0 - 100.0 fL   MCH 26.0 26.0 - 34.0 pg   MCHC 30.5 30.0 - 36.0 g/dL   RDW 09.3 (H) 23.5 - 57.3 %   Platelets 285 150 - 400 K/uL   nRBC 0.0 0.0 - 0.2 %    Comment: Performed at [facility], [address] [place], [place] [address]  Basic metabolic panel     Status: Abnormal   Collection Time: 05/10/23  9:59 AM  Result Value Ref Range   Sodium 137 135 - 145 mmol/L   Potassium 4.3 3.5 - 5.1 mmol/L   Chloride 107 98 - 111 mmol/L   CO2 21 (L) 22 - 32 mmol/L   Glucose, Bld 108 (H) 70 - 99 mg/dL    Comment: Glucose reference range applies only to samples taken after fasting for at least 8 hours.   BUN 20 8 - 23 mg/dL   Creatinine, Ser 4.27 (H) 0.61 - 1.24 mg/dL  Calcium 8.1 (L) 8.9 - 10.3 mg/dL   GFR, Estimated 46 (L) >60 mL/min    Comment: (NOTE) Calculated using the CKD-EPI  Creatinine Equation (2021)    Anion gap 9 5 - 15    Comment: Performed at [facility], [address] [place], [place] [address]  Magnesium     Status: Abnormal   Collection Time: 05/10/23  9:59 AM  Result Value Ref Range   Magnesium 1.6 (L) 1.7 - 2.4 mg/dL    Comment: Performed at [facility], [address] [place], [place] [address]  Phosphorus     Status: None   Collection Time: 05/10/23  9:59 AM  Result Value Ref Range   Phosphorus 2.9 2.5 - 4.6 mg/dL    Comment: Performed at [facility], [address], [place], [place] [address]  VITAMIN D 25 Hydroxy (Vit-D Deficiency, Fractures)     Status: None   Collection Time: 05/10/23  9:59 AM  Result Value Ref Range   Vit D, 25-Hydroxy 49.56 30 - 100 ng/mL    Comment: (NOTE) Vitamin D deficiency has been defined by the Institute of Medicine  and an Endocrine Society practice guideline as a level of serum 25-OH  vitamin D less than 20 ng/mL (1,2). The Endocrine Society went on to  further define vitamin D insufficiency as a level between 21 and 29  ng/mL (2).  1. IOM (Institute of Medicine). 2010. Dietary reference intakes for  calcium and D. [place]: [employer]. 2. Holick MF, [place], Bischoff-Ferrari HA, et al. Evaluation,  treatment, and prevention of vitamin D deficiency: an Endocrine  Society clinical practice guideline, JCEM. 2011 Jul; 96(7): 1911-30.  Performed at [facility], [address], [place], [place] [address]   Hemoglobin and hematocrit, blood     Status: Abnormal   Collection Time: 05/10/23  3:58 PM  Result Value Ref Range   Hemoglobin 8.3 (L) 13.0 - 17.0 g/dL   HCT 29.5 (L) 62.1 - 30.8 %    Comment: Performed at [facility], [address] [place], [place] [address]  Basic metabolic panel     Status: Abnormal   Collection Time: 05/11/23  5:30 AM  Result Value Ref Range   Sodium 136 135 - 145 mmol/L   Potassium 3.6 3.5 - 5.1  mmol/L   Chloride 107 98 - 111 mmol/L   CO2 21 (L) 22 - 32 mmol/L   Glucose, Bld 95 70 - 99 mg/dL    Comment: Glucose reference range applies only to samples taken after fasting for at least 8 hours.   BUN 18 8 - 23 mg/dL   Creatinine, Ser 6.96 (H) 0.61 - 1.24 mg/dL   Calcium 8.1 (L) 8.9 - 10.3 mg/dL   GFR, Estimated 45 (L) >60 mL/min    Comment: (NOTE) Calculated using the CKD-EPI Creatinine Equation (2021)    Anion gap 8 5 - 15    Comment: Performed at [facility], [address] [place], [place] [address]  CBC     Status: Abnormal   Collection Time: 05/11/23  5:30 AM  Result Value Ref Range   WBC 7.1 4.0 - 10.5 K/uL   RBC 3.23 (L) 4.22 - 5.81 MIL/uL   Hemoglobin 8.3 (L) 13.0 - 17.0 g/dL   HCT 41.3 (L) 24.4 - 01.0 %   MCV 86.7 80.0 - 100.0 fL   MCH 25.7 (L) 26.0 - 34.0 pg   MCHC 29.6 (L) 30.0 -  36.0 g/dL   RDW 16.1 (H) 09.6 - 04.5 %   Platelets 283 150 - 400 K/uL   nRBC 0.0 0.0 - 0.2 %    Comment: Performed at [facility], [address] [place], [place] [address]  Magnesium     Status: None   Collection Time: 05/11/23  5:30 AM  Result Value Ref Range   Magnesium 2.2 1.7 - 2.4 mg/dL    Comment: Performed at [facility], [address] [place], [place] [address]  Phosphorus     Status: None   Collection Time: 05/11/23  5:30 AM  Result Value Ref Range   Phosphorus 3.6 2.5 - 4.6 mg/dL    Comment: Performed at [facility], [address] [place], [place] [address]  Glucose, capillary     Status: None   Collection Time: 05/11/23  7:26 AM  Result Value Ref Range   Glucose-Capillary 96 70 - 99 mg/dL    Comment: Glucose reference range applies only to samples taken after fasting for at least 8 hours.  Basic metabolic panel     Status: Abnormal   Collection Time: 05/12/23  5:16 AM  Result Value Ref Range   Sodium 139 135 - 145 mmol/L   Potassium 3.8 3.5 - 5.1 mmol/L   Chloride 110 98 - 111 mmol/L   CO2 19 (L) 22 - 32 mmol/L    Glucose, Bld 89 70 - 99 mg/dL    Comment: Glucose reference range applies only to samples taken after fasting for at least 8 hours.   BUN 15 8 - 23 mg/dL   Creatinine, Ser 2.13 (H) 0.61 - 1.24 mg/dL   Calcium 8.4 (L) 8.9 - 10.3 mg/dL   GFR, Estimated 48 (L) >60 mL/min    Comment: (NOTE) Calculated using the CKD-EPI Creatinine Equation (2021)    Anion gap 10 5 - 15    Comment: Performed at [facility], [address] [place], [place] [address]  CBC     Status: Abnormal   Collection Time: 05/12/23  5:16 AM  Result Value Ref Range   WBC 8.3 4.0 - 10.5 K/uL   RBC 3.63 (L) 4.22 - 5.81 MIL/uL   Hemoglobin 9.4 (L) 13.0 - 17.0 g/dL   HCT 84.6 (L) 96.2 - 95.2 %   MCV 88.4 80.0 - 100.0 fL   MCH 25.9 (L) 26.0 - 34.0 pg   MCHC 29.3 (L) 30.0 - 36.0 g/dL   RDW 84.1 (H) 32.4 - 40.1 %   Platelets 273 150 - 400 K/uL   nRBC 0.2 0.0 - 0.2 %    Comment: Performed at [facility], [address] [place], [place] [address]  Magnesium     Status: None   Collection Time: 05/12/23  5:16 AM  Result Value Ref Range   Magnesium 1.9 1.7 - 2.4 mg/dL    Comment: Performed at [facility], [address], [place], [place] [address]  Phosphorus     Status: None   Collection Time: 05/12/23  5:16 AM  Result Value Ref Range   Phosphorus 3.5 2.5 - 4.6 mg/dL    Comment: Performed at [facility], [address] [place], [place] [address]  Glucose, capillary     Status: None   Collection Time: 05/12/23  8:09 AM  Result Value Ref Range   Glucose-Capillary 91 70 - 99 mg/dL    Comment: Glucose reference range applies only to samples taken after fasting for at least 8  hours.  Glucose, capillary     Status: Abnormal   Collection Time: 05/12/23 12:10 PM  Result Value Ref Range   Glucose-Capillary 108 (H) 70 - 99 mg/dL    Comment: Glucose reference range applies only to samples taken after fasting for at least 8 hours.  Comprehensive metabolic panel     Status: Abnormal    Collection Time: 06/08/23 11:16 PM  Result Value Ref Range   Sodium 137 135 - 145 mmol/L   Potassium 3.7 3.5 - 5.1 mmol/L   Chloride 103 98 - 111 mmol/L   CO2 21 (L) 22 - 32 mmol/L   Glucose, Bld 149 (H) 70 - 99 mg/dL    Comment: Glucose reference range applies only to samples taken after fasting for at least 8 hours.   BUN 25 (H) 8 - 23 mg/dL   Creatinine, Ser 4.40 (H) 0.61 - 1.24 mg/dL   Calcium 8.3 (L) 8.9 - 10.3 mg/dL   Total Protein 7.6 6.5 - 8.1 g/dL   Albumin 4.0 3.5 - 5.0 g/dL   AST 30 15 - 41 U/L   ALT 14 0 - 44 U/L   Alkaline Phosphatase 87 38 - 126 U/L   Total Bilirubin 0.4 0.3 - 1.2 mg/dL   GFR, Estimated 45 (L) >60 mL/min    Comment: (NOTE) Calculated using the CKD-EPI Creatinine Equation (2021)    Anion gap 13 5 - 15    Comment: Performed at [facility], [address] [place], [place] [address]  Lactic acid, plasma     Status: Abnormal   Collection Time: 06/08/23 11:16 PM  Result Value Ref Range   Lactic Acid, Venous 2.7 (HH) 0.5 - 1.9 mmol/L    Comment: CRITICAL RESULT CALLED TO, READ BACK BY AND VERIFIED WITH MARLEE SMITH RN @2357  06/08/23 ASW Performed at [facility], [address] [place], [place] [address]   CBC with Differential     Status: Abnormal   Collection Time: 06/08/23 11:16 PM  Result Value Ref Range   WBC 12.0 (H) 4.0 - 10.5 K/uL   RBC 4.78 4.22 - 5.81 MIL/uL   Hemoglobin 13.1 13.0 - 17.0 g/dL   HCT 87.5 64.3 - 32.9 %   MCV 88.1 80.0 - 100.0 fL   MCH 27.4 26.0 - 34.0 pg   MCHC 31.1 30.0 - 36.0 g/dL   RDW 51.8 (H) 84.1 - 66.0 %   Platelets 177 150 - 400 K/uL   nRBC 0.0 0.0 - 0.2 %   Neutrophils Relative % 88 %   Neutro Abs 10.6 (H) 1.7 - 7.7 K/uL   Lymphocytes Relative 3 %   Lymphs Abs 0.3 (L) 0.7 - 4.0 K/uL   Monocytes Relative 6 %   Monocytes Absolute 0.8 0.1 - 1.0 K/uL   Eosinophils Relative 1 %   Eosinophils Absolute 0.1 0.0 - 0.5 K/uL   Basophils Relative 1 %   Basophils Absolute 0.1 0.0 - 0.1 K/uL    Immature Granulocytes 1 %   Abs Immature Granulocytes 0.08 (H) 0.00 - 0.07 K/uL    Comment: Performed at [facility], [address] [place], [place] [address]  Protime-INR     Status: None   Collection Time: 06/08/23 11:16 PM  Result Value Ref Range   Prothrombin Time 15.1 11.4 - 15.2 seconds   INR 1.2 0.8 - 1.2    Comment: (NOTE) INR goal varies based on device and disease states. Performed at [facility], [address]  [place], [place] [address]   SARS Coronavirus 2 by RT PCR (hospital order, performed in [facility] hospital lab) *cepheid single result test* Anterior Nasal Swab     Status: Abnormal   Collection Time: 06/08/23 11:16 PM   Specimen: Anterior Nasal Swab  Result Value Ref Range   SARS Coronavirus 2 by RT PCR POSITIVE (A) NEGATIVE    Comment: (NOTE) SARS-CoV-2 target nucleic acids are DETECTED  SARS-CoV-2 RNA is generally detectable in upper respiratory specimens  during the acute phase of infection.  Positive results are indicative  of the presence of the identified virus, but do not rule out bacterial infection or co-infection with other pathogens not detected by the test.  Clinical correlation with patient history and  other diagnostic information is necessary to determine patient infection status.  The expected result is negative.  Fact Sheet for Patients:   RoadLapTop.co.za   Fact Sheet for Healthcare Providers:   http://kim-miller.com/    This test is not yet approved or cleared by the [place] FDA and  has been authorized for detection and/or diagnosis of SARS-CoV-2 by FDA under an Emergency Use Authorization (EUA).  This EUA will remain in effect (meaning this test can be used) for the duration of  the COVID-19 declaration under Section 564(b)(1)  of the Act, 21 U.S.C. section 360-bbb-3(b)(1), unless the authorization is terminated or revoked sooner.   Performed at [facility], [address] [place], [place] [address]   Type and screen [facility]     Status: None   Collection Time: 06/08/23 11:16 PM  Result Value Ref Range   ABO/RH(D) O POS    Antibody Screen NEG    Sample Expiration      06/11/2023,2359 Performed at [facility], [address] [place], [place] [address]   Troponin I (High Sensitivity)     Status: Abnormal   Collection Time: 06/08/23 11:16 PM  Result Value Ref Range   Troponin I (High Sensitivity) 29 (H) <18 ng/L    Comment: (NOTE) Elevated high sensitivity troponin I (hsTnI) values and significant  changes across serial measurements may suggest ACS but many other  chronic and acute conditions are known to elevate hsTnI results.  Refer to the "Links" section for chest pain algorithms and additional  guidance. Performed at [facility], [address] [place], [place] [address]   Procalcitonin     Status: None   Collection Time: 06/08/23 11:16 PM  Result Value Ref Range   Procalcitonin <0.10 ng/mL    Comment:        Interpretation: PCT (Procalcitonin) <= 0.5 ng/mL: Systemic infection (sepsis) is not likely. Local bacterial infection is possible. (NOTE)       Sepsis PCT Algorithm           Lower Respiratory Tract                                      Infection PCT Algorithm    ----------------------------     ----------------------------         PCT < 0.25 ng/mL                PCT < 0.10 ng/mL          Strongly encourage             Strongly discourage   discontinuation of antibiotics    initiation of  antibiotics    ----------------------------     -----------------------------       PCT 0.25 - 0.50 ng/mL            PCT 0.10 - 0.25 ng/mL               OR       >80% decrease in PCT            Discourage initiation of                                            antibiotics      Encourage discontinuation           of antibiotics    ----------------------------      -----------------------------         PCT >= 0.50 ng/mL              PCT 0.26 - 0.50 ng/mL               AND        <80% decrease in PCT             Encourage initiation of                                             antibiotics       Encourage continuation           of antibiotics    ----------------------------     -----------------------------        PCT >= 0.50 ng/mL                  PCT > 0.50 ng/mL               AND         increase in PCT                  Strongly encourage                                      initiation of antibiotics    Strongly encourage escalation           of antibiotics                                     -----------------------------                                           PCT <= 0.25 ng/mL                                                 OR                                        >  80% decrease in PCT                                      Discontinue / Do not initiate                                             antibiotics  Performed at [facility], [address] [place], [place] [address]   Culture, blood (Routine x 2)     Status: None   Collection Time: 06/08/23 11:32 PM   Specimen: BLOOD  Result Value Ref Range   Specimen Description BLOOD HAND    Special Requests      BOTTLES DRAWN AEROBIC AND ANAEROBIC Blood Culture adequate volume   Culture      NO GROWTH 5 DAYS Performed at [facility], [address] [place], [place] [address]    Report Status 06/13/2023 FINAL   Culture, blood (Routine x 2)     Status: None   Collection Time: 06/08/23 11:32 PM   Specimen: BLOOD  Result Value Ref Range   Specimen Description BLOOD BLOOD LEFT ARM    Special Requests      BOTTLES DRAWN AEROBIC AND ANAEROBIC Blood Culture adequate volume   Culture      NO GROWTH 5 DAYS Performed at [facility], [address], [place], [place] [address]    Report Status 06/13/2023 FINAL   Lactic acid, plasma     Status: None    Collection Time: 06/09/23  1:08 AM  Result Value Ref Range   Lactic Acid, Venous 1.2 0.5 - 1.9 mmol/L    Comment: Performed at [facility], [address] [place], [place] [address]  Urinalysis, w/ Reflex to Culture (Infection Suspected) -Urine, Clean Catch     Status: Abnormal   Collection Time: 06/09/23  1:33 AM  Result Value Ref Range   Specimen Source URINE, CATHETERIZED    Color, Urine YELLOW (A) YELLOW   APPearance CLEAR (A) CLEAR   Specific Gravity, Urine 1.016 1.005 - 1.030   pH 5.0 5.0 - 8.0   Glucose, UA NEGATIVE NEGATIVE mg/dL   Hgb urine dipstick SMALL (A) NEGATIVE   Bilirubin Urine NEGATIVE NEGATIVE   Ketones, ur NEGATIVE NEGATIVE mg/dL   Protein, ur 30 (A) NEGATIVE mg/dL   Nitrite NEGATIVE NEGATIVE   Leukocytes,Ua NEGATIVE NEGATIVE   RBC / HPF 0-5 0 - 5 RBC/hpf   WBC, UA 0-5 0 - 5 WBC/hpf    Comment:        Reflex urine culture not performed if WBC <=10, OR if Squamous epithelial cells >5. If Squamous epithelial cells >5 suggest recollection.    Bacteria, UA NONE SEEN NONE SEEN   Squamous Epithelial / HPF 0-5 0 - 5 /HPF   Mucus PRESENT     Comment: Performed at [facility], [address] [place], [place] [address]  CBC     Status: Abnormal   Collection Time: 06/09/23  6:45 AM  Result Value Ref Range   WBC 8.6 4.0 - 10.5 K/uL   RBC 4.51 4.22 - 5.81 MIL/uL   Hemoglobin 12.2 (L) 13.0 - 17.0 g/dL   HCT 13.0 86.5 - 78.4 %   MCV 86.5 80.0 - 100.0 fL   MCH 27.1 26.0 - 34.0  pg   MCHC 31.3 30.0 - 36.0 g/dL   RDW 37.1 (H) 06.2 - 69.4 %   Platelets 170 150 - 400 K/uL   nRBC 0.0 0.0 - 0.2 %    Comment: Performed at [facility], [address] [place], [place] [address]  Creatinine, serum     Status: Abnormal   Collection Time: 06/09/23  6:45 AM  Result Value Ref Range   Creatinine, Ser 1.44 (H) 0.61 - 1.24 mg/dL   GFR, Estimated 48 (L) >60 mL/min    Comment: (NOTE) Calculated using the CKD-EPI Creatinine Equation  (2021) Performed at [facility], [address] [place], [place] [address]   Basic metabolic panel     Status: Abnormal   Collection Time: 06/10/23  4:08 AM  Result Value Ref Range   Sodium 136 135 - 145 mmol/L   Potassium 3.7 3.5 - 5.1 mmol/L   Chloride 104 98 - 111 mmol/L   CO2 25 22 - 32 mmol/L   Glucose, Bld 96 70 - 99 mg/dL    Comment: Glucose reference range applies only to samples taken after fasting for at least 8 hours.   BUN 22 8 - 23 mg/dL   Creatinine, Ser 0.93 (H) 0.61 - 1.24 mg/dL   Calcium 7.9 (L) 8.9 - 10.3 mg/dL   GFR, Estimated 48 (L) >60 mL/min    Comment: (NOTE) Calculated using the CKD-EPI Creatinine Equation (2021)    Anion gap 7 5 - 15    Comment: Performed at [facility], [address] [place], [place] [address]  Basic metabolic panel     Status: Abnormal   Collection Time: 06/11/23  6:01 AM  Result Value Ref Range   Sodium 138 135 - 145 mmol/L   Potassium 3.7 3.5 - 5.1 mmol/L   Chloride 103 98 - 111 mmol/L   CO2 25 22 - 32 mmol/L   Glucose, Bld 78 70 - 99 mg/dL    Comment: Glucose reference range applies only to samples taken after fasting for at least 8 hours.   BUN 24 (H) 8 - 23 mg/dL   Creatinine, Ser 9.37 (H) 0.61 - 1.24 mg/dL   Calcium 8.3 (L) 8.9 - 10.3 mg/dL   GFR, Estimated 44 (L) >60 mL/min    Comment: (NOTE) Calculated using the CKD-EPI Creatinine Equation (2021)    Anion gap 10 5 - 15    Comment: Performed at [facility], [address] [place], [place] [address]  Lactic acid, plasma     Status: None   Collection Time: 06/11/23  8:41 AM  Result Value Ref Range   Lactic Acid, Venous 0.8 0.5 - 1.9 mmol/L    Comment: Performed at [facility], [address] [place], [place] [address]  Comprehensive metabolic panel     Status: Abnormal   Collection Time: 07/04/23 12:12 PM  Result Value Ref Range   Sodium 136 135 - 145 mmol/L   Potassium 3.6 3.5 - 5.1 mmol/L   Chloride 99 98 - 111 mmol/L    CO2 26 22 - 32 mmol/L   Glucose, Bld 99 70 - 99 mg/dL    Comment: Glucose reference range applies only to samples taken after fasting for at least 8 hours.   BUN 30 (H) 8 - 23 mg/dL   Creatinine, Ser 0.17 (H) 0.61 - 1.24 mg/dL   Calcium 9.2 8.9 - 51.0 mg/dL   Total Protein 7.4 6.5 - 8.1 g/dL   Albumin 4.0 3.5 -  5.0 g/dL   AST 21 15 - 41 U/L   ALT 17 0 - 44 U/L   Alkaline Phosphatase 75 38 - 126 U/L   Total Bilirubin 0.6 0.3 - 1.2 mg/dL   GFR, Estimated 45 (L) >60 mL/min    Comment: (NOTE) Calculated using the CKD-EPI Creatinine Equation (2021)    Anion gap 11 5 - 15    Comment: Performed at [facility], [address] [place], [place] [address]  CBC     Status: Abnormal   Collection Time: 07/04/23 12:12 PM  Result Value Ref Range   WBC 7.1 4.0 - 10.5 K/uL   RBC 5.29 4.22 - 5.81 MIL/uL   Hemoglobin 14.3 13.0 - 17.0 g/dL   HCT 51.8 84.1 - 66.0 %   MCV 86.8 80.0 - 100.0 fL   MCH 27.0 26.0 - 34.0 pg   MCHC 31.2 30.0 - 36.0 g/dL   RDW 63.0 (H) 16.0 - 10.9 %   Platelets 223 150 - 400 K/uL   nRBC 0.0 0.0 - 0.2 %    Comment: Performed at [facility], [address] [place], [place] [address]  Type and screen [facility]     Status: None   Collection Time: 07/04/23 12:12 PM  Result Value Ref Range   ABO/RH(D) O POS    Antibody Screen NEG    Sample Expiration      07/07/2023,2359 Performed at [facility], [address] [place], [place] [address]   Vitamin B12     Status: None   Collection Time: 07/21/23 12:49 PM  Result Value Ref Range   Vitamin B-12 824 180 - 914 pg/mL    Comment: (NOTE) This assay is not validated for testing neonatal or myeloproliferative syndrome specimens for Vitamin B12 levels. Performed at [facility], [address], [place], [place] [address]   Ferritin     Status: None   Collection Time: 07/21/23 12:49 PM  Result Value Ref Range   Ferritin 97 24 - 336 ng/mL    Comment: Performed  at [facility], [address] [place], [place] [address]  Iron and TIBC     Status: Abnormal   Collection Time: 07/21/23 12:49 PM  Result Value Ref Range   Iron 39 (L) 45 - 182 ug/dL   TIBC 628 315 - 176 ug/dL   Saturation Ratios 14 (L) 17.9 - 39.5 %   UIBC 240 ug/dL    Comment: Performed at [facility], [address] [place], [place] [address]  CBC with Differential/Platelet     Status: Abnormal   Collection Time: 07/21/23 12:49 PM  Result Value Ref Range   WBC 6.9 4.0 - 10.5 K/uL   RBC 4.87 4.22 - 5.81 MIL/uL   Hemoglobin 13.5 13.0 - 17.0 g/dL   HCT 71.0 62.6 - 94.8 %   MCV 88.1 80.0 - 100.0 fL   MCH 27.7 26.0 - 34.0 pg   MCHC 31.5 30.0 - 36.0 g/dL   RDW 54.6 (H) 27.0 - 35.0 %   Platelets 202 150 - 400 K/uL   nRBC 0.0 0.0 - 0.2 %   Neutrophils Relative % 73 %   Neutro Abs 5.0 1.7 - 7.7 K/uL   Lymphocytes Relative 13 %   Lymphs Abs 0.9 0.7 - 4.0 K/uL   Monocytes Relative 7 %   Monocytes Absolute 0.5 0.1 - 1.0 K/uL   Eosinophils Relative 6 %   Eosinophils Absolute 0.4 0.0 -  0.5 K/uL   Basophils Relative 1 %   Basophils Absolute 0.1 0.0 - 0.1 K/uL   Immature Granulocytes 0 %   Abs Immature Granulocytes 0.03 0.00 - 0.07 K/uL    Comment: Performed at [facility], [address], [place], [place] [address]    Radiology No results found.  Assessment/Plan Hyperlipidemia lipid control important in reducing the progression of atherosclerotic disease. Continue statin therapy     Essential hypertension, benign blood pressure control important in reducing the progression of atherosclerotic disease. On appropriate oral medications.  Atherosclerosis of native arteries of the extremities with ulceration (HCC) His ABIs today have dropped dramatically on the right down to 0.37 from 0.7.  Duplex was done which shows a right SFA occlusion.  His left ABI is stable at 0.70. This represents a critical and limb threatening situation.  We discussed the  grave nature of the situation that he will require revascularization for limb salvage.  This is not a situation that should be managed conservatively due to the high risk of limb loss.  I do not think a digital amputation is likely to heal without revascularization.  AAA (abdominal aortic aneurysm) without rupture (HCC) Duplex shows this to be stable at 4 cm in maximal diameter.  No immediate need for repair and right lower extremity revascularization needs to be done at this time to try to improve perfusion for wound healing.  This could be a source of embolic disease which could be an issue going forward.  Carotid stenosis Carotid duplex today shows stable 1 to 39% ICA stenosis bilaterally.  Almost a decade status post right carotid endarterectomy.  On Xarelto and aspirin.  Continue to follow annually.    Festus Barren, MD  08/04/2023 11:49 AM    This note was created with Dragon medical transcription system.  Any errors from dictation are purely unintentional

## 2023-08-04 NOTE — Assessment & Plan Note (Signed)
His ABIs today have dropped dramatically on the right down to 0.37 from 0.7.  Duplex was done which shows a right SFA occlusion.  His left ABI is stable at 0.70. This represents a critical and limb threatening situation.  We discussed the grave nature of the situation that he will require revascularization for limb salvage.  This is not a situation that should be managed conservatively due to the high risk of limb loss.  I do not think a digital amputation is likely to heal without revascularization.

## 2023-08-04 NOTE — Progress Notes (Signed)
MRN : [id number]  Bryan Welch is a 87 y.o. (11/15/1935) male who presents with chief complaint of  Chief Complaint  Patient presents with   Venous Insufficiency  .  History of Present Illness: Patient returns today in follow up of multiple vascular issues.  His biggest issue currently is of an infected nonhealing ulceration on the right foot.  He has an extensive history of vascular disease and has undergone previous revascularization in years past.  He also has a known abdominal aortic aneurysm.  He has lost 1 toe on the right leg already.  He denies any fevers or chills or signs of systemic infection at this time.  His ABIs today have dropped dramatically on the right down to 0.37 from 0.7.  Duplex was done which shows a right SFA occlusion.  His left ABI is stable at 0.70. He is also followed for aneurysmal disease.  He has a known abdominal aortic aneurysm.  Duplex today shows this to be stable at 4 cm in maximal diameter. Finally, he is also followed for carotid disease.  No focal neurologic symptoms.  Carotid duplex today shows stable 1 to 39% ICA stenosis bilaterally.  Current Outpatient Medications  Medication Sig Dispense Refill   acetaminophen (TYLENOL) 500 MG tablet Take 1,000 mg by mouth at bedtime.     ALPRAZolam (XANAX) 0.25 MG tablet Take 0.25 mg by mouth at bedtime.     aspirin EC 81 MG tablet Take 81 mg daily by mouth.      Cholecalciferol 25 MCG (1000 UT) capsule Take 2,000 Units by mouth daily.     cyanocobalamin 1000 MCG tablet Take 1 tablet (1,000 mcg total) by mouth daily. 90 tablet 0   diphenhydrAMINE (BENADRYL) 25 mg capsule Take 25 mg at bedtime by mouth.      gabapentin (NEURONTIN) 300 MG capsule Take 300 mg by mouth 2 (two) times daily.     hydrochlorothiazide (HYDRODIURIL) 25 MG tablet Take 1 tablet (25 mg total) by mouth daily. 30 tablet 0   hydrochlorothiazide (HYDRODIURIL) 25 MG tablet Take 1 tablet by mouth daily.     Multiple Vitamins-Minerals  (PRESERVISION AREDS 2 PO) Take 1 tablet by mouth 2 (two) times daily.     omeprazole (PRILOSEC) 20 MG capsule Take 20 mg by mouth at bedtime.     potassium chloride (KLOR-CON) 10 MEQ tablet Take 10 mEq by mouth daily.     pravastatin (PRAVACHOL) 20 MG tablet Take 20 mg by mouth at bedtime.     tamsulosin (FLOMAX) 0.4 MG CAPS capsule Take 0.4 mg by mouth daily.  3   XARELTO 20 MG TABS tablet Take 20 mg by mouth daily.     No current facility-administered medications for this visit.    Past Medical History:  Diagnosis Date   Arthritis    Toes   Coronary artery disease    History of hiatal hernia    Hyperlipidemia    Hypertension    Kidney disorder    "right kidney doesn't work"   Presence of stent in artery    "4 in legs, 1 in neck"   Vascular abnormality    Wears dentures    full upper and lower    Past Surgical History:  Procedure Laterality Date   AMPUTATION TOE Right 09/16/2021   Procedure: AMPUTATION TOE;  Surgeon: Rosetta Posner, DPM;  Location: [facility];  Service: Podiatry;  Laterality: Right;   BACK SURGERY     CAROTID STENT Right  CATARACT EXTRACTION W/PHACO Left 06/17/2019   Procedure: CATARACT EXTRACTION PHACO AND INTRAOCULAR LENS PLACEMENT (IOC) LEFT;  Surgeon: Nevada Crane, MD;  Location: [facility];  Service: Ophthalmology;  Laterality: Left;   CATARACT EXTRACTION W/PHACO Right 07/08/2019   Procedure: CATARACT EXTRACTION PHACO AND INTRAOCULAR LENS PLACEMENT (IOC) right  00:39.2  10.8%  5.02  ;  Surgeon: Nevada Crane, MD;  Location: [facility];  Service: Ophthalmology;  Laterality: Right;   COLONOSCOPY WITH PROPOFOL N/A 04/13/2023   Procedure: COLONOSCOPY WITH PROPOFOL;  Surgeon: Jaynie Collins, DO;  Location: [facility] END[facility]COPY;  Service: Gastroenterology;  Laterality: N/A;   ENTEROSCOPY N/A 05/10/2023   Procedure: ENTEROSCOPY;  Surgeon: Midge Minium, MD;  Location: [facility];  Service: Endoscopy;  Laterality: N/A;    ESOPHAGOGASTRODUODENOSCOPY (EGD) WITH PROPOFOL N/A 04/13/2023   Procedure: ESOPHAGOGASTRODUODENOSCOPY (EGD) WITH PROPOFOL;  Surgeon: Jaynie Collins, DO;  Location: [facility] ENDOSCOPY;  Service: Gastroenterology;  Laterality: N/A;   EYE SURGERY     GIVENS CAPSULE STUDY N/A 05/09/2023   Procedure: GIVENS CAPSULE STUDY;  Surgeon: Midge Minium, MD;  Location: [facility] ENDOS[facility];  Service: Endoscopy;  Laterality: N/A;   HOT HEMOSTASIS  05/10/2023   Procedure: HOT HEMOSTASIS (ARGON PLASMA COAGULATION/BICAP);  Surgeon: Midge Minium, MD;  Location: [facility] ENDOSCOPY;  Service: Endoscopy;;   LEG SURGERY Bilateral    stent placement   LOWER EXTREMITY ANGIOGRAPHY Right 08/19/2021   Procedure: LOWER EXTREMITY ANGIOGRAPHY;  Surgeon: Annice Needy, MD;  Location: [facility];  Service: Cardiovascular;  Laterality: Right;   PERIPHERAL VASCULAR BALLOON ANGIOPLASTY Right 09/21/2017   Procedure: PERIPHERAL VASCULAR BALLOON ANGIOPLASTY;  Surgeon: Annice Needy, MD;  Location: [facility];  Service: Cardiovascular;  Laterality: Right;   REVERSE SHOULDER ARTHROPLASTY Right 11/26/2019   Procedure: REVERSE SHOULDER ARTHROPLASTY;  Surgeon: Christena Flake, MD;  Location: [facility] ORS;  Service: Orthopedics;  Laterality: Right;     Social History   Tobacco Use   Smoking status: Former    Current packs/day: 0.00    Types: Cigarettes    Quit date: 2001    Years since quitting: 23.7   Smokeless tobacco: Never  Vaping Use   Vaping status: Never Used  Substance Use Topics   Alcohol use: Not Currently   Drug use: No      Family History  Problem Relation Age of Onset   Bladder Cancer Mother    Prostate cancer Father    Prostate cancer Brother    Diabetes Maternal Grandfather      Allergies  Allergen Reactions   Codeine Itching    REVIEW OF SYSTEMS (Negative unless checked)   Constitutional: [] Weight loss  [] Fever  [] Chills Cardiac: [] Chest pain   [] Chest pressure   [] Palpitations   [] Shortness  of breath when laying flat   [] Shortness of breath at rest   [x] Shortness of breath with exertion. Vascular:  [x] Pain in legs with walking   [] Pain in legs at rest   [] Pain in legs when laying flat   [x] Claudication   [] Pain in feet when walking  [] Pain in feet at rest  [] Pain in feet when laying flat   [] History of DVT   [] Phlebitis   [] Swelling in legs   [] Varicose veins   [x] Non-healing ulcers Pulmonary:   [] Uses home oxygen   [] Productive cough   [] Hemoptysis   [] Wheeze  [] COPD   [] Asthma Neurologic:  [] Dizziness  [] Blackouts   [] Seizures   [] History of stroke   [] History of TIA  []   Aphasia   [] Temporary blindness   [] Dysphagia   [] Weakness or numbness in arms   [] Weakness or numbness in legs Musculoskeletal:  [x] Arthritis   [] Joint swelling   [x] Joint pain   [] Low back pain Hematologic:  [] Easy bruising  [] Easy bleeding   [] Hypercoagulable state   [] Anemic   Gastrointestinal:  [] Blood in stool   [] Vomiting blood  [] Gastroesophageal reflux/heartburn   [] Abdominal pain Genitourinary:  [x] Chronic kidney disease   [] Difficult urination  [] Frequent urination  [] Burning with urination   [] Hematuria Skin:  [] Rashes   [x] Ulcers   [x] Wounds Psychological:  [] History of anxiety   []  History of major depression.  Physical Examination  BP (!) 146/70 (BP Location: Left Arm)   Pulse 74   Resp 19   Ht 5\' 5"  (1.651 m)   Wt 132 lb (59.9 kg)   BMI 21.97 kg/m  Gen:  WD/WN, NAD. Appears younger than stated age. Head: /AT, No temporalis wasting. Ear/Nose/Throat: Hearing diminished, nares w/o erythema or drainage Eyes: Conjunctiva clear. Sclera non-icteric Neck: Supple.  Trachea midline Pulmonary:  Good air movement, no use of accessory muscles.  Cardiac: RRR, no JVD Vascular:  Vessel Right Left  Radial Palpable Palpable                          PT Not Palpable 1+ Palpable  DP Not Palpable 1+ Palpable   Gastrointestinal: soft, non-tender/non-distended. No guarding/reflex.  Musculoskeletal:  M/S 5/5 throughout.  No deformity or atrophy.  Dressed ulcerations present on the right foot.  Trace lower extremity edema. Neurologic: Sensation grossly intact in extremities.  Symmetrical.  Speech is fluent.  Psychiatric: Judgment intact, Mood & affect appropriate for pt's clinical situation. Dermatologic: Right foot ulceration is present.      Labs Recent Results (from the past 2160 hour(s))  Reticulocytes     Status: Abnormal   Collection Time: 05/08/23  3:01 PM  Result Value Ref Range   Retic Ct Pct 5.7 (H) 0.4 - 3.1 %   RBC. 2.43 (L) 4.22 - 5.81 MIL/uL   Retic Count, Absolute 137.3 19.0 - 186.0 K/uL   Immature Retic Fract 40.1 (H) 2.3 - 15.9 %    Comment: Performed at [facility], [address] [place], [place] [address]  CBC     Status: Abnormal   Collection Time: 05/08/23  3:02 PM  Result Value Ref Range   WBC 5.9 4.0 - 10.5 K/uL   RBC 2.41 (L) 4.22 - 5.81 MIL/uL   Hemoglobin 5.9 (L) 13.0 - 17.0 g/dL   HCT 47.8 (L) 29.5 - 62.1 %   MCV 86.3 80.0 - 100.0 fL   MCH 24.5 (L) 26.0 - 34.0 pg   MCHC 28.4 (L) 30.0 - 36.0 g/dL   RDW 30.8 (H) 65.7 - 84.6 %   Platelets 304 150 - 400 K/uL   nRBC 0.0 0.0 - 0.2 %    Comment: Performed at [facility], [address] [place], [place] [address]  Type and screen [facility]     Status: None   Collection Time: 05/08/23  3:08 PM  Result Value Ref Range   ABO/RH(D) O POS    Antibody Screen NEG    Sample Expiration 05/11/2023,2359    Unit Number [id number]    Blood Component Type RBC, LR IRR    Unit division 00    Status of Unit ISSUED,FINAL    Transfusion Status OK TO TRANSFUSE    Crossmatch  Result Compatible    Unit Number [id number]    Blood Component Type RED CELLS,LR    Unit division 00    Status of Unit ISSUED,FINAL    Transfusion Status OK TO TRANSFUSE    Crossmatch Result      Compatible Performed at [facility], [address] [place], [place] [address]    BPAM RBC     Status: None   Collection Time: 05/08/23  3:08 PM  Result Value Ref Range   ISSUE DATE / TIME 884166063016    Blood Product Unit Number [id number]    PRODUCT CODE E0379V00    Unit Type and Rh 9500    Blood Product Expiration Date 025427062376    ISSUE DATE / TIME 283151761607    Blood Product Unit Number [id number]    PRODUCT CODE E7035K09    Unit Type and Rh 5100    Blood Product Expiration Date 381829937169   Magnesium     Status: None   Collection Time: 05/08/23  3:08 PM  Result Value Ref Range   Magnesium 1.8 1.7 - 2.4 mg/dL    Comment: Performed at [facility], [address] [place], [place] [address]  Folate     Status: None   Collection Time: 05/08/23  3:08 PM  Result Value Ref Range   Folate 12.1 >5.9 ng/mL    Comment: Performed at [facility], [address] [place], [place] [address]  Iron and TIBC     Status: Abnormal   Collection Time: 05/08/23  3:08 PM  Result Value Ref Range   Iron 22 (L) 45 - 182 ug/dL   TIBC 510 258 - 527 ug/dL   Saturation Ratios 5 (L) 17.9 - 39.5 %   UIBC 392 ug/dL    Comment: Performed at [facility], [address] [place], [place] [address]  Ferritin     Status: None   Collection Time: 05/08/23  3:08 PM  Result Value Ref Range   Ferritin 54 24 - 336 ng/mL    Comment: Performed at [facility], [address] [place], [place] [address]  Comprehensive metabolic panel     Status: Abnormal   Collection Time: 05/08/23  3:10 PM  Result Value Ref Range   Sodium 138 135 - 145 mmol/L   Potassium 3.4 (L) 3.5 - 5.1 mmol/L   Chloride 107 98 - 111 mmol/L   CO2 21 (L) 22 - 32 mmol/L   Glucose, Bld 126 (H) 70 - 99 mg/dL    Comment: Glucose reference range applies only to samples taken after fasting for at least 8 hours.   BUN 30 (H) 8 - 23 mg/dL   Creatinine, Ser 4.43 (H) 0.61 - 1.24 mg/dL   Calcium 8.2 (L) 8.9 - 10.3 mg/dL   Total Protein 6.7 6.5 - 8.1 g/dL   Albumin 3.9 3.5 -  5.0 g/dL   AST 18 15 - 41 U/L   ALT 9 0 - 44 U/L   Alkaline Phosphatase 54 38 - 126 U/L   Total Bilirubin 0.5 0.3 - 1.2 mg/dL   GFR, Estimated 41 (L) >60 mL/min    Comment: (NOTE) Calculated using the CKD-EPI Creatinine Equation (2021)    Anion gap 10 5 - 15    Comment: Performed at [facility], [address] [place], [place] [address]  Protime-INR     Status: Abnormal   Collection Time: 05/08/23  3:10 PM  Result Value Ref Range   Prothrombin Time  27.1 (H) 11.4 - 15.2 seconds   INR 2.5 (H) 0.8 - 1.2    Comment: (NOTE) INR goal varies based on device and disease states. Performed at [facility], [address] [place], [place] [address]   Prepare RBC (crossmatch)     Status: None   Collection Time: 05/08/23  4:00 PM  Result Value Ref Range   Order Confirmation      ORDER PROCESSED BY BLOOD BANK Performed at [facility], [address] [place], [place] [address]   APTT     Status: Abnormal   Collection Time: 05/08/23  9:37 PM  Result Value Ref Range   aPTT 40 (H) 24 - 36 seconds    Comment:        IF BASELINE aPTT IS ELEVATED, SUGGEST PATIENT RISK ASSESSMENT BE USED TO DETERMINE APPROPRIATE ANTICOAGULANT THERAPY. Performed at [facility], [address] [place], [place] [address]   CBC     Status: Abnormal   Collection Time: 05/08/23  9:37 PM  Result Value Ref Range   WBC 7.2 4.0 - 10.5 K/uL   RBC 2.78 (L) 4.22 - 5.81 MIL/uL   Hemoglobin 7.0 (L) 13.0 - 17.0 g/dL   HCT 32.4 (L) 40.1 - 02.7 %   MCV 84.2 80.0 - 100.0 fL   MCH 25.2 (L) 26.0 - 34.0 pg   MCHC 29.9 (L) 30.0 - 36.0 g/dL   RDW 25.3 (H) 66.4 - 40.3 %   Platelets 292 150 - 400 K/uL   nRBC 0.4 (H) 0.0 - 0.2 %    Comment: Performed at [facility], [address] [place], [place] [address]  Vitamin B12     Status: None   Collection Time: 05/08/23  9:37 PM  Result Value Ref Range   Vitamin B-12 380 180 - 914 pg/mL    Comment: (NOTE) This assay is  not validated for testing neonatal or myeloproliferative syndrome specimens for Vitamin B12 levels. Performed at [facility], [address], [place], [place] [address]   CBC     Status: Abnormal   Collection Time: 05/09/23  1:22 AM  Result Value Ref Range   WBC 8.0 4.0 - 10.5 K/uL   RBC 2.51 (L) 4.22 - 5.81 MIL/uL   Hemoglobin 6.4 (L) 13.0 - 17.0 g/dL   HCT 75.6 (L) 43.3 - 29.5 %   MCV 84.1 80.0 - 100.0 fL   MCH 25.5 (L) 26.0 - 34.0 pg   MCHC 30.3 30.0 - 36.0 g/dL   RDW 18.8 (H) 41.6 - 60.6 %   Platelets 279 150 - 400 K/uL   nRBC 0.3 (H) 0.0 - 0.2 %    Comment: Performed at [facility], [address], [place], [place] [address]  Basic metabolic panel     Status: Abnormal   Collection Time: 05/09/23  1:22 AM  Result Value Ref Range   Sodium 139 135 - 145 mmol/L   Potassium 3.7 3.5 - 5.1 mmol/L   Chloride 109 98 - 111 mmol/L   CO2 21 (L) 22 - 32 mmol/L   Glucose, Bld 91 70 - 99 mg/dL    Comment: Glucose reference range applies only to samples taken after fasting for at least 8 hours.   BUN 26 (H) 8 - 23 mg/dL   Creatinine, Ser 1.09 (H) 0.61 - 1.24 mg/dL   Calcium 7.9 (L) 8.9 - 10.3 mg/dL   GFR, Estimated 50 (L) >60 mL/min    Comment: (NOTE)  Calculated using the CKD-EPI Creatinine Equation (2021)    Anion gap 9 5 - 15    Comment: Performed at [facility], [address] [place], [place] [address]  Prepare RBC (crossmatch)     Status: None   Collection Time: 05/09/23  2:12 AM  Result Value Ref Range   Order Confirmation      ORDER PROCESSED BY BLOOD BANK Performed at [facility], [address] [place], [place] [address]   CBC     Status: Abnormal   Collection Time: 05/09/23  7:11 AM  Result Value Ref Range   WBC 6.8 4.0 - 10.5 K/uL   RBC 2.99 (L) 4.22 - 5.81 MIL/uL   Hemoglobin 7.8 (L) 13.0 - 17.0 g/dL   HCT 32.4 (L) 40.1 - 02.7 %   MCV 85.6 80.0 - 100.0 fL   MCH 26.1 26.0 - 34.0 pg   MCHC 30.5 30.0 - 36.0 g/dL   RDW 25.3  (H) 66.4 - 15.5 %   Platelets 272 150 - 400 K/uL   nRBC 0.0 0.0 - 0.2 %    Comment: Performed at [facility], [address] [place], [place] [address]  Glucose, capillary     Status: None   Collection Time: 05/09/23  7:29 AM  Result Value Ref Range   Glucose-Capillary 89 70 - 99 mg/dL    Comment: Glucose reference range applies only to samples taken after fasting for at least 8 hours.  CBC     Status: Abnormal   Collection Time: 05/09/23  1:12 PM  Result Value Ref Range   WBC 6.6 4.0 - 10.5 K/uL   RBC 2.95 (L) 4.22 - 5.81 MIL/uL   Hemoglobin 7.6 (L) 13.0 - 17.0 g/dL   HCT 42.5 (L) 95.6 - 38.7 %   MCV 85.8 80.0 - 100.0 fL   MCH 25.8 (L) 26.0 - 34.0 pg   MCHC 30.0 30.0 - 36.0 g/dL   RDW 56.4 (H) 33.2 - 95.1 %   Platelets 267 150 - 400 K/uL   nRBC 0.0 0.0 - 0.2 %    Comment: Performed at [facility], [address] [place], [place] [address]  CBC     Status: Abnormal   Collection Time: 05/09/23  7:33 PM  Result Value Ref Range   WBC 6.9 4.0 - 10.5 K/uL   RBC 3.04 (L) 4.22 - 5.81 MIL/uL   Hemoglobin 7.9 (L) 13.0 - 17.0 g/dL   HCT 60.6 (L) 30.1 - 60.1 %   MCV 85.2 80.0 - 100.0 fL   MCH 26.0 26.0 - 34.0 pg   MCHC 30.5 30.0 - 36.0 g/dL   RDW 09.3 (H) 23.5 - 57.3 %   Platelets 285 150 - 400 K/uL   nRBC 0.0 0.0 - 0.2 %    Comment: Performed at [facility], [address] [place], [place] [address]  Basic metabolic panel     Status: Abnormal   Collection Time: 05/10/23  9:59 AM  Result Value Ref Range   Sodium 137 135 - 145 mmol/L   Potassium 4.3 3.5 - 5.1 mmol/L   Chloride 107 98 - 111 mmol/L   CO2 21 (L) 22 - 32 mmol/L   Glucose, Bld 108 (H) 70 - 99 mg/dL    Comment: Glucose reference range applies only to samples taken after fasting for at least 8 hours.   BUN 20 8 - 23 mg/dL   Creatinine, Ser 4.27 (H) 0.61 - 1.24 mg/dL  Calcium 8.1 (L) 8.9 - 10.3 mg/dL   GFR, Estimated 46 (L) >60 mL/min    Comment: (NOTE) Calculated using the CKD-EPI  Creatinine Equation (2021)    Anion gap 9 5 - 15    Comment: Performed at [facility], [address] [place], [place] [address]  Magnesium     Status: Abnormal   Collection Time: 05/10/23  9:59 AM  Result Value Ref Range   Magnesium 1.6 (L) 1.7 - 2.4 mg/dL    Comment: Performed at [facility], [address] [place], [place] [address]  Phosphorus     Status: None   Collection Time: 05/10/23  9:59 AM  Result Value Ref Range   Phosphorus 2.9 2.5 - 4.6 mg/dL    Comment: Performed at [facility], [address], [place], [place] [address]  VITAMIN D 25 Hydroxy (Vit-D Deficiency, Fractures)     Status: None   Collection Time: 05/10/23  9:59 AM  Result Value Ref Range   Vit D, 25-Hydroxy 49.56 30 - 100 ng/mL    Comment: (NOTE) Vitamin D deficiency has been defined by the Institute of Medicine  and an Endocrine Society practice guideline as a level of serum 25-OH  vitamin D less than 20 ng/mL (1,2). The Endocrine Society went on to  further define vitamin D insufficiency as a level between 21 and 29  ng/mL (2).  1. IOM (Institute of Medicine). 2010. Dietary reference intakes for  calcium and D. [place]: [employer]. 2. Holick MF, [place], Bischoff-Ferrari HA, et al. Evaluation,  treatment, and prevention of vitamin D deficiency: an Endocrine  Society clinical practice guideline, JCEM. 2011 Jul; 96(7): 1911-30.  Performed at [facility], [address], [place], [place] [address]   Hemoglobin and hematocrit, blood     Status: Abnormal   Collection Time: 05/10/23  3:58 PM  Result Value Ref Range   Hemoglobin 8.3 (L) 13.0 - 17.0 g/dL   HCT 29.5 (L) 62.1 - 30.8 %    Comment: Performed at [facility], [address] [place], [place] [address]  Basic metabolic panel     Status: Abnormal   Collection Time: 05/11/23  5:30 AM  Result Value Ref Range   Sodium 136 135 - 145 mmol/L   Potassium 3.6 3.5 - 5.1  mmol/L   Chloride 107 98 - 111 mmol/L   CO2 21 (L) 22 - 32 mmol/L   Glucose, Bld 95 70 - 99 mg/dL    Comment: Glucose reference range applies only to samples taken after fasting for at least 8 hours.   BUN 18 8 - 23 mg/dL   Creatinine, Ser 6.96 (H) 0.61 - 1.24 mg/dL   Calcium 8.1 (L) 8.9 - 10.3 mg/dL   GFR, Estimated 45 (L) >60 mL/min    Comment: (NOTE) Calculated using the CKD-EPI Creatinine Equation (2021)    Anion gap 8 5 - 15    Comment: Performed at [facility], [address] [place], [place] [address]  CBC     Status: Abnormal   Collection Time: 05/11/23  5:30 AM  Result Value Ref Range   WBC 7.1 4.0 - 10.5 K/uL   RBC 3.23 (L) 4.22 - 5.81 MIL/uL   Hemoglobin 8.3 (L) 13.0 - 17.0 g/dL   HCT 41.3 (L) 24.4 - 01.0 %   MCV 86.7 80.0 - 100.0 fL   MCH 25.7 (L) 26.0 - 34.0 pg   MCHC 29.6 (L) 30.0 -  36.0 g/dL   RDW 16.1 (H) 09.6 - 04.5 %   Platelets 283 150 - 400 K/uL   nRBC 0.0 0.0 - 0.2 %    Comment: Performed at [facility], [address] [place], [place] [address]  Magnesium     Status: None   Collection Time: 05/11/23  5:30 AM  Result Value Ref Range   Magnesium 2.2 1.7 - 2.4 mg/dL    Comment: Performed at [facility], [address] [place], [place] [address]  Phosphorus     Status: None   Collection Time: 05/11/23  5:30 AM  Result Value Ref Range   Phosphorus 3.6 2.5 - 4.6 mg/dL    Comment: Performed at [facility], [address] [place], [place] [address]  Glucose, capillary     Status: None   Collection Time: 05/11/23  7:26 AM  Result Value Ref Range   Glucose-Capillary 96 70 - 99 mg/dL    Comment: Glucose reference range applies only to samples taken after fasting for at least 8 hours.  Basic metabolic panel     Status: Abnormal   Collection Time: 05/12/23  5:16 AM  Result Value Ref Range   Sodium 139 135 - 145 mmol/L   Potassium 3.8 3.5 - 5.1 mmol/L   Chloride 110 98 - 111 mmol/L   CO2 19 (L) 22 - 32 mmol/L    Glucose, Bld 89 70 - 99 mg/dL    Comment: Glucose reference range applies only to samples taken after fasting for at least 8 hours.   BUN 15 8 - 23 mg/dL   Creatinine, Ser 2.13 (H) 0.61 - 1.24 mg/dL   Calcium 8.4 (L) 8.9 - 10.3 mg/dL   GFR, Estimated 48 (L) >60 mL/min    Comment: (NOTE) Calculated using the CKD-EPI Creatinine Equation (2021)    Anion gap 10 5 - 15    Comment: Performed at [facility], [address] [place], [place] [address]  CBC     Status: Abnormal   Collection Time: 05/12/23  5:16 AM  Result Value Ref Range   WBC 8.3 4.0 - 10.5 K/uL   RBC 3.63 (L) 4.22 - 5.81 MIL/uL   Hemoglobin 9.4 (L) 13.0 - 17.0 g/dL   HCT 84.6 (L) 96.2 - 95.2 %   MCV 88.4 80.0 - 100.0 fL   MCH 25.9 (L) 26.0 - 34.0 pg   MCHC 29.3 (L) 30.0 - 36.0 g/dL   RDW 84.1 (H) 32.4 - 40.1 %   Platelets 273 150 - 400 K/uL   nRBC 0.2 0.0 - 0.2 %    Comment: Performed at [facility], [address] [place], [place] [address]  Magnesium     Status: None   Collection Time: 05/12/23  5:16 AM  Result Value Ref Range   Magnesium 1.9 1.7 - 2.4 mg/dL    Comment: Performed at [facility], [address], [place], [place] [address]  Phosphorus     Status: None   Collection Time: 05/12/23  5:16 AM  Result Value Ref Range   Phosphorus 3.5 2.5 - 4.6 mg/dL    Comment: Performed at [facility], [address] [place], [place] [address]  Glucose, capillary     Status: None   Collection Time: 05/12/23  8:09 AM  Result Value Ref Range   Glucose-Capillary 91 70 - 99 mg/dL    Comment: Glucose reference range applies only to samples taken after fasting for at least 8  hours.  Glucose, capillary     Status: Abnormal   Collection Time: 05/12/23 12:10 PM  Result Value Ref Range   Glucose-Capillary 108 (H) 70 - 99 mg/dL    Comment: Glucose reference range applies only to samples taken after fasting for at least 8 hours.  Comprehensive metabolic panel     Status: Abnormal    Collection Time: 06/08/23 11:16 PM  Result Value Ref Range   Sodium 137 135 - 145 mmol/L   Potassium 3.7 3.5 - 5.1 mmol/L   Chloride 103 98 - 111 mmol/L   CO2 21 (L) 22 - 32 mmol/L   Glucose, Bld 149 (H) 70 - 99 mg/dL    Comment: Glucose reference range applies only to samples taken after fasting for at least 8 hours.   BUN 25 (H) 8 - 23 mg/dL   Creatinine, Ser 4.40 (H) 0.61 - 1.24 mg/dL   Calcium 8.3 (L) 8.9 - 10.3 mg/dL   Total Protein 7.6 6.5 - 8.1 g/dL   Albumin 4.0 3.5 - 5.0 g/dL   AST 30 15 - 41 U/L   ALT 14 0 - 44 U/L   Alkaline Phosphatase 87 38 - 126 U/L   Total Bilirubin 0.4 0.3 - 1.2 mg/dL   GFR, Estimated 45 (L) >60 mL/min    Comment: (NOTE) Calculated using the CKD-EPI Creatinine Equation (2021)    Anion gap 13 5 - 15    Comment: Performed at [facility], [address] [place], [place] [address]  Lactic acid, plasma     Status: Abnormal   Collection Time: 06/08/23 11:16 PM  Result Value Ref Range   Lactic Acid, Venous 2.7 (HH) 0.5 - 1.9 mmol/L    Comment: CRITICAL RESULT CALLED TO, READ BACK BY AND VERIFIED WITH MARLEE SMITH RN @2357  06/08/23 ASW Performed at [facility], [address] [place], [place] [address]   CBC with Differential     Status: Abnormal   Collection Time: 06/08/23 11:16 PM  Result Value Ref Range   WBC 12.0 (H) 4.0 - 10.5 K/uL   RBC 4.78 4.22 - 5.81 MIL/uL   Hemoglobin 13.1 13.0 - 17.0 g/dL   HCT 87.5 64.3 - 32.9 %   MCV 88.1 80.0 - 100.0 fL   MCH 27.4 26.0 - 34.0 pg   MCHC 31.1 30.0 - 36.0 g/dL   RDW 51.8 (H) 84.1 - 66.0 %   Platelets 177 150 - 400 K/uL   nRBC 0.0 0.0 - 0.2 %   Neutrophils Relative % 88 %   Neutro Abs 10.6 (H) 1.7 - 7.7 K/uL   Lymphocytes Relative 3 %   Lymphs Abs 0.3 (L) 0.7 - 4.0 K/uL   Monocytes Relative 6 %   Monocytes Absolute 0.8 0.1 - 1.0 K/uL   Eosinophils Relative 1 %   Eosinophils Absolute 0.1 0.0 - 0.5 K/uL   Basophils Relative 1 %   Basophils Absolute 0.1 0.0 - 0.1 K/uL    Immature Granulocytes 1 %   Abs Immature Granulocytes 0.08 (H) 0.00 - 0.07 K/uL    Comment: Performed at [facility], [address] [place], [place] [address]  Protime-INR     Status: None   Collection Time: 06/08/23 11:16 PM  Result Value Ref Range   Prothrombin Time 15.1 11.4 - 15.2 seconds   INR 1.2 0.8 - 1.2    Comment: (NOTE) INR goal varies based on device and disease states. Performed at [facility], [address]  [place], [place] [address]   SARS Coronavirus 2 by RT PCR (hospital order, performed in [facility] hospital lab) *cepheid single result test* Anterior Nasal Swab     Status: Abnormal   Collection Time: 06/08/23 11:16 PM   Specimen: Anterior Nasal Swab  Result Value Ref Range   SARS Coronavirus 2 by RT PCR POSITIVE (A) NEGATIVE    Comment: (NOTE) SARS-CoV-2 target nucleic acids are DETECTED  SARS-CoV-2 RNA is generally detectable in upper respiratory specimens  during the acute phase of infection.  Positive results are indicative  of the presence of the identified virus, but do not rule out bacterial infection or co-infection with other pathogens not detected by the test.  Clinical correlation with patient history and  other diagnostic information is necessary to determine patient infection status.  The expected result is negative.  Fact Sheet for Patients:   RoadLapTop.co.za   Fact Sheet for Healthcare Providers:   http://kim-miller.com/    This test is not yet approved or cleared by the [place] FDA and  has been authorized for detection and/or diagnosis of SARS-CoV-2 by FDA under an Emergency Use Authorization (EUA).  This EUA will remain in effect (meaning this test can be used) for the duration of  the COVID-19 declaration under Section 564(b)(1)  of the Act, 21 U.S.C. section 360-bbb-3(b)(1), unless the authorization is terminated or revoked sooner.   Performed at [facility], [address] [place], [place] [address]   Type and screen [facility]     Status: None   Collection Time: 06/08/23 11:16 PM  Result Value Ref Range   ABO/RH(D) O POS    Antibody Screen NEG    Sample Expiration      06/11/2023,2359 Performed at [facility], [address] [place], [place] [address]   Troponin I (High Sensitivity)     Status: Abnormal   Collection Time: 06/08/23 11:16 PM  Result Value Ref Range   Troponin I (High Sensitivity) 29 (H) <18 ng/L    Comment: (NOTE) Elevated high sensitivity troponin I (hsTnI) values and significant  changes across serial measurements may suggest ACS but many other  chronic and acute conditions are known to elevate hsTnI results.  Refer to the "Links" section for chest pain algorithms and additional  guidance. Performed at [facility], [address] [place], [place] [address]   Procalcitonin     Status: None   Collection Time: 06/08/23 11:16 PM  Result Value Ref Range   Procalcitonin <0.10 ng/mL    Comment:        Interpretation: PCT (Procalcitonin) <= 0.5 ng/mL: Systemic infection (sepsis) is not likely. Local bacterial infection is possible. (NOTE)       Sepsis PCT Algorithm           Lower Respiratory Tract                                      Infection PCT Algorithm    ----------------------------     ----------------------------         PCT < 0.25 ng/mL                PCT < 0.10 ng/mL          Strongly encourage             Strongly discourage   discontinuation of antibiotics    initiation of  antibiotics    ----------------------------     -----------------------------       PCT 0.25 - 0.50 ng/mL            PCT 0.10 - 0.25 ng/mL               OR       >80% decrease in PCT            Discourage initiation of                                            antibiotics      Encourage discontinuation           of antibiotics    ----------------------------      -----------------------------         PCT >= 0.50 ng/mL              PCT 0.26 - 0.50 ng/mL               AND        <80% decrease in PCT             Encourage initiation of                                             antibiotics       Encourage continuation           of antibiotics    ----------------------------     -----------------------------        PCT >= 0.50 ng/mL                  PCT > 0.50 ng/mL               AND         increase in PCT                  Strongly encourage                                      initiation of antibiotics    Strongly encourage escalation           of antibiotics                                     -----------------------------                                           PCT <= 0.25 ng/mL                                                 OR                                        >  80% decrease in PCT                                      Discontinue / Do not initiate                                             antibiotics  Performed at [facility], [address] [place], [place] [address]   Culture, blood (Routine x 2)     Status: None   Collection Time: 06/08/23 11:32 PM   Specimen: BLOOD  Result Value Ref Range   Specimen Description BLOOD HAND    Special Requests      BOTTLES DRAWN AEROBIC AND ANAEROBIC Blood Culture adequate volume   Culture      NO GROWTH 5 DAYS Performed at [facility], [address] [place], [place] [address]    Report Status 06/13/2023 FINAL   Culture, blood (Routine x 2)     Status: None   Collection Time: 06/08/23 11:32 PM   Specimen: BLOOD  Result Value Ref Range   Specimen Description BLOOD BLOOD LEFT ARM    Special Requests      BOTTLES DRAWN AEROBIC AND ANAEROBIC Blood Culture adequate volume   Culture      NO GROWTH 5 DAYS Performed at [facility], [address], [place], [place] [address]    Report Status 06/13/2023 FINAL   Lactic acid, plasma     Status: None    Collection Time: 06/09/23  1:08 AM  Result Value Ref Range   Lactic Acid, Venous 1.2 0.5 - 1.9 mmol/L    Comment: Performed at [facility], [address] [place], [place] [address]  Urinalysis, w/ Reflex to Culture (Infection Suspected) -Urine, Clean Catch     Status: Abnormal   Collection Time: 06/09/23  1:33 AM  Result Value Ref Range   Specimen Source URINE, CATHETERIZED    Color, Urine YELLOW (A) YELLOW   APPearance CLEAR (A) CLEAR   Specific Gravity, Urine 1.016 1.005 - 1.030   pH 5.0 5.0 - 8.0   Glucose, UA NEGATIVE NEGATIVE mg/dL   Hgb urine dipstick SMALL (A) NEGATIVE   Bilirubin Urine NEGATIVE NEGATIVE   Ketones, ur NEGATIVE NEGATIVE mg/dL   Protein, ur 30 (A) NEGATIVE mg/dL   Nitrite NEGATIVE NEGATIVE   Leukocytes,Ua NEGATIVE NEGATIVE   RBC / HPF 0-5 0 - 5 RBC/hpf   WBC, UA 0-5 0 - 5 WBC/hpf    Comment:        Reflex urine culture not performed if WBC <=10, OR if Squamous epithelial cells >5. If Squamous epithelial cells >5 suggest recollection.    Bacteria, UA NONE SEEN NONE SEEN   Squamous Epithelial / HPF 0-5 0 - 5 /HPF   Mucus PRESENT     Comment: Performed at [facility], [address] [place], [place] [address]  CBC     Status: Abnormal   Collection Time: 06/09/23  6:45 AM  Result Value Ref Range   WBC 8.6 4.0 - 10.5 K/uL   RBC 4.51 4.22 - 5.81 MIL/uL   Hemoglobin 12.2 (L) 13.0 - 17.0 g/dL   HCT 13.0 86.5 - 78.4 %   MCV 86.5 80.0 - 100.0 fL   MCH 27.1 26.0 - 34.0  pg   MCHC 31.3 30.0 - 36.0 g/dL   RDW 37.1 (H) 06.2 - 69.4 %   Platelets 170 150 - 400 K/uL   nRBC 0.0 0.0 - 0.2 %    Comment: Performed at [facility], [address] [place], [place] [address]  Creatinine, serum     Status: Abnormal   Collection Time: 06/09/23  6:45 AM  Result Value Ref Range   Creatinine, Ser 1.44 (H) 0.61 - 1.24 mg/dL   GFR, Estimated 48 (L) >60 mL/min    Comment: (NOTE) Calculated using the CKD-EPI Creatinine Equation  (2021) Performed at [facility], [address] [place], [place] [address]   Basic metabolic panel     Status: Abnormal   Collection Time: 06/10/23  4:08 AM  Result Value Ref Range   Sodium 136 135 - 145 mmol/L   Potassium 3.7 3.5 - 5.1 mmol/L   Chloride 104 98 - 111 mmol/L   CO2 25 22 - 32 mmol/L   Glucose, Bld 96 70 - 99 mg/dL    Comment: Glucose reference range applies only to samples taken after fasting for at least 8 hours.   BUN 22 8 - 23 mg/dL   Creatinine, Ser 0.93 (H) 0.61 - 1.24 mg/dL   Calcium 7.9 (L) 8.9 - 10.3 mg/dL   GFR, Estimated 48 (L) >60 mL/min    Comment: (NOTE) Calculated using the CKD-EPI Creatinine Equation (2021)    Anion gap 7 5 - 15    Comment: Performed at [facility], [address] [place], [place] [address]  Basic metabolic panel     Status: Abnormal   Collection Time: 06/11/23  6:01 AM  Result Value Ref Range   Sodium 138 135 - 145 mmol/L   Potassium 3.7 3.5 - 5.1 mmol/L   Chloride 103 98 - 111 mmol/L   CO2 25 22 - 32 mmol/L   Glucose, Bld 78 70 - 99 mg/dL    Comment: Glucose reference range applies only to samples taken after fasting for at least 8 hours.   BUN 24 (H) 8 - 23 mg/dL   Creatinine, Ser 9.37 (H) 0.61 - 1.24 mg/dL   Calcium 8.3 (L) 8.9 - 10.3 mg/dL   GFR, Estimated 44 (L) >60 mL/min    Comment: (NOTE) Calculated using the CKD-EPI Creatinine Equation (2021)    Anion gap 10 5 - 15    Comment: Performed at [facility], [address] [place], [place] [address]  Lactic acid, plasma     Status: None   Collection Time: 06/11/23  8:41 AM  Result Value Ref Range   Lactic Acid, Venous 0.8 0.5 - 1.9 mmol/L    Comment: Performed at [facility], [address] [place], [place] [address]  Comprehensive metabolic panel     Status: Abnormal   Collection Time: 07/04/23 12:12 PM  Result Value Ref Range   Sodium 136 135 - 145 mmol/L   Potassium 3.6 3.5 - 5.1 mmol/L   Chloride 99 98 - 111 mmol/L    CO2 26 22 - 32 mmol/L   Glucose, Bld 99 70 - 99 mg/dL    Comment: Glucose reference range applies only to samples taken after fasting for at least 8 hours.   BUN 30 (H) 8 - 23 mg/dL   Creatinine, Ser 0.17 (H) 0.61 - 1.24 mg/dL   Calcium 9.2 8.9 - 51.0 mg/dL   Total Protein 7.4 6.5 - 8.1 g/dL   Albumin 4.0 3.5 -  5.0 g/dL   AST 21 15 - 41 U/L   ALT 17 0 - 44 U/L   Alkaline Phosphatase 75 38 - 126 U/L   Total Bilirubin 0.6 0.3 - 1.2 mg/dL   GFR, Estimated 45 (L) >60 mL/min    Comment: (NOTE) Calculated using the CKD-EPI Creatinine Equation (2021)    Anion gap 11 5 - 15    Comment: Performed at [facility], [address] [place], [place] [address]  CBC     Status: Abnormal   Collection Time: 07/04/23 12:12 PM  Result Value Ref Range   WBC 7.1 4.0 - 10.5 K/uL   RBC 5.29 4.22 - 5.81 MIL/uL   Hemoglobin 14.3 13.0 - 17.0 g/dL   HCT 51.8 84.1 - 66.0 %   MCV 86.8 80.0 - 100.0 fL   MCH 27.0 26.0 - 34.0 pg   MCHC 31.2 30.0 - 36.0 g/dL   RDW 63.0 (H) 16.0 - 10.9 %   Platelets 223 150 - 400 K/uL   nRBC 0.0 0.0 - 0.2 %    Comment: Performed at [facility], [address] [place], [place] [address]  Type and screen [facility]     Status: None   Collection Time: 07/04/23 12:12 PM  Result Value Ref Range   ABO/RH(D) O POS    Antibody Screen NEG    Sample Expiration      07/07/2023,2359 Performed at [facility], [address] [place], [place] [address]   Vitamin B12     Status: None   Collection Time: 07/21/23 12:49 PM  Result Value Ref Range   Vitamin B-12 824 180 - 914 pg/mL    Comment: (NOTE) This assay is not validated for testing neonatal or myeloproliferative syndrome specimens for Vitamin B12 levels. Performed at [facility], [address], [place], [place] [address]   Ferritin     Status: None   Collection Time: 07/21/23 12:49 PM  Result Value Ref Range   Ferritin 97 24 - 336 ng/mL    Comment: Performed  at [facility], [address] [place], [place] [address]  Iron and TIBC     Status: Abnormal   Collection Time: 07/21/23 12:49 PM  Result Value Ref Range   Iron 39 (L) 45 - 182 ug/dL   TIBC 628 315 - 176 ug/dL   Saturation Ratios 14 (L) 17.9 - 39.5 %   UIBC 240 ug/dL    Comment: Performed at [facility], [address] [place], [place] [address]  CBC with Differential/Platelet     Status: Abnormal   Collection Time: 07/21/23 12:49 PM  Result Value Ref Range   WBC 6.9 4.0 - 10.5 K/uL   RBC 4.87 4.22 - 5.81 MIL/uL   Hemoglobin 13.5 13.0 - 17.0 g/dL   HCT 71.0 62.6 - 94.8 %   MCV 88.1 80.0 - 100.0 fL   MCH 27.7 26.0 - 34.0 pg   MCHC 31.5 30.0 - 36.0 g/dL   RDW 54.6 (H) 27.0 - 35.0 %   Platelets 202 150 - 400 K/uL   nRBC 0.0 0.0 - 0.2 %   Neutrophils Relative % 73 %   Neutro Abs 5.0 1.7 - 7.7 K/uL   Lymphocytes Relative 13 %   Lymphs Abs 0.9 0.7 - 4.0 K/uL   Monocytes Relative 7 %   Monocytes Absolute 0.5 0.1 - 1.0 K/uL   Eosinophils Relative 6 %   Eosinophils Absolute 0.4 0.0 -  0.5 K/uL   Basophils Relative 1 %   Basophils Absolute 0.1 0.0 - 0.1 K/uL   Immature Granulocytes 0 %   Abs Immature Granulocytes 0.03 0.00 - 0.07 K/uL    Comment: Performed at [facility], [address], [place], [place] [address]    Radiology No results found.  Assessment/Plan Hyperlipidemia lipid control important in reducing the progression of atherosclerotic disease. Continue statin therapy     Essential hypertension, benign blood pressure control important in reducing the progression of atherosclerotic disease. On appropriate oral medications.  Atherosclerosis of native arteries of the extremities with ulceration (HCC) His ABIs today have dropped dramatically on the right down to 0.37 from 0.7.  Duplex was done which shows a right SFA occlusion.  His left ABI is stable at 0.70. This represents a critical and limb threatening situation.  We discussed the  grave nature of the situation that he will require revascularization for limb salvage.  This is not a situation that should be managed conservatively due to the high risk of limb loss.  I do not think a digital amputation is likely to heal without revascularization.  AAA (abdominal aortic aneurysm) without rupture (HCC) Duplex shows this to be stable at 4 cm in maximal diameter.  No immediate need for repair and right lower extremity revascularization needs to be done at this time to try to improve perfusion for wound healing.  This could be a source of embolic disease which could be an issue going forward.  Carotid stenosis Carotid duplex today shows stable 1 to 39% ICA stenosis bilaterally.  Almost a decade status post right carotid endarterectomy.  On Xarelto and aspirin.  Continue to follow annually.    Festus Barren, MD  08/04/2023 11:49 AM    This note was created with Dragon medical transcription system.  Any errors from dictation are purely unintentional

## 2023-08-07 LAB — VAS US ABI WITH/WO TBI
Left ABI: 0.7
Right ABI: 0.37

## 2023-08-09 ENCOUNTER — Inpatient Hospital Stay
Admission: AD | Admit: 2023-08-09 | Discharge: 2023-08-11 | DRG: 271 | Disposition: A | Discharge status: Home / Self Care | Payer: Medicare Other | Attending: Vascular Surgery | Admitting: Vascular Surgery

## 2023-08-09 ENCOUNTER — Encounter
Admission: AD | Disposition: A | Discharge status: Home / Self Care | Payer: Self-pay | Source: Home / Self Care | Attending: Vascular Surgery

## 2023-08-09 ENCOUNTER — Encounter: Payer: Self-pay | Admitting: Vascular Surgery

## 2023-08-09 ENCOUNTER — Other Ambulatory Visit: Payer: Self-pay

## 2023-08-09 DIAGNOSIS — I714 Abdominal aortic aneurysm, without rupture, unspecified: Secondary | ICD-10-CM | POA: Diagnosis present

## 2023-08-09 DIAGNOSIS — I872 Venous insufficiency (chronic) (peripheral): Secondary | ICD-10-CM | POA: Diagnosis present

## 2023-08-09 DIAGNOSIS — Z87891 Personal history of nicotine dependence: Secondary | ICD-10-CM

## 2023-08-09 DIAGNOSIS — I70229 Atherosclerosis of native arteries of extremities with rest pain, unspecified extremity: Principal | ICD-10-CM | POA: Diagnosis present

## 2023-08-09 DIAGNOSIS — I6523 Occlusion and stenosis of bilateral carotid arteries: Secondary | ICD-10-CM | POA: Diagnosis present

## 2023-08-09 DIAGNOSIS — Z96611 Presence of right artificial shoulder joint: Secondary | ICD-10-CM | POA: Diagnosis present

## 2023-08-09 DIAGNOSIS — I701 Atherosclerosis of renal artery: Secondary | ICD-10-CM | POA: Diagnosis not present

## 2023-08-09 DIAGNOSIS — M19071 Primary osteoarthritis, right ankle and foot: Secondary | ICD-10-CM | POA: Diagnosis present

## 2023-08-09 DIAGNOSIS — I70235 Atherosclerosis of native arteries of right leg with ulceration of other part of foot: Principal | ICD-10-CM | POA: Diagnosis present

## 2023-08-09 DIAGNOSIS — Z89421 Acquired absence of other right toe(s): Secondary | ICD-10-CM

## 2023-08-09 DIAGNOSIS — I739 Peripheral vascular disease, unspecified: Secondary | ICD-10-CM

## 2023-08-09 DIAGNOSIS — Z833 Family history of diabetes mellitus: Secondary | ICD-10-CM

## 2023-08-09 DIAGNOSIS — I743 Embolism and thrombosis of arteries of the lower extremities: Secondary | ICD-10-CM | POA: Diagnosis present

## 2023-08-09 DIAGNOSIS — Z8042 Family history of malignant neoplasm of prostate: Secondary | ICD-10-CM

## 2023-08-09 DIAGNOSIS — Z79899 Other long term (current) drug therapy: Secondary | ICD-10-CM

## 2023-08-09 DIAGNOSIS — I719 Aortic aneurysm of unspecified site, without rupture: Secondary | ICD-10-CM | POA: Diagnosis not present

## 2023-08-09 DIAGNOSIS — Z7901 Long term (current) use of anticoagulants: Secondary | ICD-10-CM | POA: Diagnosis not present

## 2023-08-09 DIAGNOSIS — I70211 Atherosclerosis of native arteries of extremities with intermittent claudication, right leg: Secondary | ICD-10-CM | POA: Diagnosis present

## 2023-08-09 DIAGNOSIS — I2510 Atherosclerotic heart disease of native coronary artery without angina pectoris: Secondary | ICD-10-CM | POA: Diagnosis present

## 2023-08-09 DIAGNOSIS — Z7982 Long term (current) use of aspirin: Secondary | ICD-10-CM

## 2023-08-09 DIAGNOSIS — I10 Essential (primary) hypertension: Secondary | ICD-10-CM | POA: Diagnosis present

## 2023-08-09 DIAGNOSIS — Z885 Allergy status to narcotic agent status: Secondary | ICD-10-CM

## 2023-08-09 DIAGNOSIS — T82856A Stenosis of peripheral vascular stent, initial encounter: Secondary | ICD-10-CM | POA: Diagnosis not present

## 2023-08-09 DIAGNOSIS — Z9889 Other specified postprocedural states: Secondary | ICD-10-CM | POA: Diagnosis not present

## 2023-08-09 DIAGNOSIS — L97519 Non-pressure chronic ulcer of other part of right foot with unspecified severity: Secondary | ICD-10-CM | POA: Diagnosis present

## 2023-08-09 DIAGNOSIS — T82868A Thrombosis of vascular prosthetic devices, implants and grafts, initial encounter: Secondary | ICD-10-CM | POA: Diagnosis not present

## 2023-08-09 DIAGNOSIS — Z95828 Presence of other vascular implants and grafts: Secondary | ICD-10-CM | POA: Diagnosis not present

## 2023-08-09 DIAGNOSIS — E785 Hyperlipidemia, unspecified: Secondary | ICD-10-CM | POA: Diagnosis present

## 2023-08-09 DIAGNOSIS — I70221 Atherosclerosis of native arteries of extremities with rest pain, right leg: Secondary | ICD-10-CM

## 2023-08-09 DIAGNOSIS — M19072 Primary osteoarthritis, left ankle and foot: Secondary | ICD-10-CM | POA: Diagnosis present

## 2023-08-09 DIAGNOSIS — Z8052 Family history of malignant neoplasm of bladder: Secondary | ICD-10-CM | POA: Diagnosis not present

## 2023-08-09 DIAGNOSIS — I82431 Acute embolism and thrombosis of right popliteal vein: Secondary | ICD-10-CM | POA: Diagnosis present

## 2023-08-09 DIAGNOSIS — I708 Atherosclerosis of other arteries: Secondary | ICD-10-CM

## 2023-08-09 HISTORY — DX: Atherosclerosis of native arteries of extremities with rest pain, unspecified extremity: I70.229

## 2023-08-09 HISTORY — PX: LOWER EXTREMITY ANGIOGRAPHY: CATH118251

## 2023-08-09 LAB — FIBRINOGEN
Fibrinogen: 474 mg/dL (ref 210–475)
Fibrinogen: 438 mg/dL (ref 210–475)
Fibrinogen: 388 mg/dL (ref 210–475)

## 2023-08-09 LAB — CBC
HCT: 38.2 % — ABNORMAL LOW (ref 39.0–52.0)
HCT: 34.9 % — ABNORMAL LOW (ref 39.0–52.0)
HCT: 38 % — ABNORMAL LOW (ref 39.0–52.0)
Hemoglobin: 12.6 g/dL — ABNORMAL LOW (ref 13.0–17.0)
Hemoglobin: 11.7 g/dL — ABNORMAL LOW (ref 13.0–17.0)
Hemoglobin: 12.4 g/dL — ABNORMAL LOW (ref 13.0–17.0)
MCH: 27.8 pg (ref 26.0–34.0)
MCH: 28.1 pg (ref 26.0–34.0)
MCH: 28.2 pg (ref 26.0–34.0)
MCHC: 33 g/dL (ref 30.0–36.0)
MCHC: 33.5 g/dL (ref 30.0–36.0)
MCHC: 32.6 g/dL (ref 30.0–36.0)
MCV: 84.3 fL (ref 80.0–100.0)
MCV: 83.9 fL (ref 80.0–100.0)
MCV: 86.6 fL (ref 80.0–100.0)
Platelets: 202 10*3/uL (ref 150–400)
Platelets: 180 10*3/uL (ref 150–400)
Platelets: 175 10*3/uL (ref 150–400)
RBC: 4.53 MIL/uL (ref 4.22–5.81)
RBC: 4.16 MIL/uL — ABNORMAL LOW (ref 4.22–5.81)
RBC: 4.39 MIL/uL (ref 4.22–5.81)
RDW: 15.4 % (ref 11.5–15.5)
RDW: 15.7 % — ABNORMAL HIGH (ref 11.5–15.5)
RDW: 15.4 % (ref 11.5–15.5)
WBC: 7.5 10*3/uL (ref 4.0–10.5)
WBC: 8.2 10*3/uL (ref 4.0–10.5)
WBC: 9.9 10*3/uL (ref 4.0–10.5)
nRBC: 0 % (ref 0.0–0.2)
nRBC: 0 % (ref 0.0–0.2)
nRBC: 0 % (ref 0.0–0.2)

## 2023-08-09 LAB — CREATININE, SERUM
Creatinine, Ser: 1.45 mg/dL — ABNORMAL HIGH (ref 0.61–1.24)
GFR, Estimated: 48 mL/min — ABNORMAL LOW (ref >60)

## 2023-08-09 LAB — HEPARIN LEVEL (UNFRACTIONATED)
Heparin Unfractionated: >1.1 [IU]/mL — ABNORMAL HIGH (ref 0.30–0.70)
Heparin Unfractionated: 0.6 [IU]/mL (ref 0.30–0.70)
Heparin Unfractionated: 0.53 [IU]/mL (ref 0.30–0.70)

## 2023-08-09 LAB — MRSA NEXT GEN BY PCR, NASAL: MRSA by PCR Next Gen: NOT DETECTED

## 2023-08-09 LAB — GLUCOSE, CAPILLARY: Glucose-Capillary: 106 mg/dL — ABNORMAL HIGH (ref 70–99)

## 2023-08-09 LAB — BUN: BUN: 29 mg/dL — ABNORMAL HIGH (ref 8–23)

## 2023-08-09 SURGERY — LOWER EXTREMITY ANGIOGRAPHY
Anesthesia: Moderate Sedation | Site: Leg Lower | Laterality: Right

## 2023-08-09 MED ORDER — ORAL CARE MOUTH RINSE: 15.0000 mL | OROMUCOSAL | Status: DC | PRN

## 2023-08-09 MED ORDER — HYDROMORPHONE HCL 1 MG/ML IJ SOLN
1.0000 mg | Freq: Once | INTRAMUSCULAR | Status: AC | PRN
  Administered 2023-08-09: 1 mg via INTRAVENOUS

## 2023-08-09 MED ORDER — HYDROCHLOROTHIAZIDE 25 MG PO TABS: 25.0000 mg | ORAL_TABLET | Freq: Every day | ORAL | Status: DC

## 2023-08-09 MED ORDER — FENTANYL CITRATE (PF) 100 MCG/2ML IJ SOLN
INTRAMUSCULAR | Status: DC | PRN
  Administered 2023-08-09 (×2): 25 ug via INTRAVENOUS
  Administered 2023-08-09: 50 ug via INTRAVENOUS

## 2023-08-09 MED ORDER — FENTANYL CITRATE (PF) 100 MCG/2ML IJ SOLN
INTRAMUSCULAR | Status: AC
  Filled 2023-08-09: qty 2

## 2023-08-09 MED ORDER — SODIUM CHLORIDE 0.9 % IV SOLN
0.5000 mg/h | INTRAVENOUS | Status: DC
  Administered 2023-08-10: 0.5 mg/h
  Filled 2023-08-09 (×2): qty 10

## 2023-08-09 MED ORDER — ONDANSETRON HCL 4 MG/2ML IJ SOLN
4.0000 mg | Freq: Four times a day (QID) | INTRAMUSCULAR | Status: DC | PRN
  Administered 2023-08-11: 4 mg via INTRAVENOUS

## 2023-08-09 MED ORDER — MIDAZOLAM HCL 2 MG/2ML IJ SOLN
INTRAMUSCULAR | Status: AC
  Filled 2023-08-09: qty 4

## 2023-08-09 MED ORDER — VITAMIN D 25 MCG (1000 UNIT) PO TABS
2000.0000 [IU] | ORAL_TABLET | Freq: Every day | ORAL | Status: DC
  Administered 2023-08-10 – 2023-08-11 (×2): 2000 [IU] via ORAL
  Filled 2023-08-09 (×2): qty 2

## 2023-08-09 MED ORDER — HYDROCHLOROTHIAZIDE 25 MG PO TABS
25.0000 mg | ORAL_TABLET | Freq: Every day | ORAL | Status: DC
  Administered 2023-08-10 – 2023-08-11 (×2): 25 mg via ORAL
  Filled 2023-08-09 (×2): qty 1

## 2023-08-09 MED ORDER — HEPARIN (PORCINE) 25000 UT/250ML-% IV SOLN: 600.0000 [IU]/h | INTRAVENOUS | Status: DC

## 2023-08-09 MED ORDER — HEPARIN (PORCINE) IN NACL 1000-0.9 UT/500ML-% IV SOLN
INTRAVENOUS | Status: DC | PRN
  Administered 2023-08-09: 1000 mL

## 2023-08-09 MED ORDER — TRAMADOL HCL 50 MG PO TABS: 50.0000 mg | ORAL_TABLET | Freq: Four times a day (QID) | ORAL | Status: DC | PRN

## 2023-08-09 MED ORDER — ASPIRIN 81 MG PO TBEC
81.0000 mg | DELAYED_RELEASE_TABLET | Freq: Every day | ORAL | Status: DC
  Administered 2023-08-10 – 2023-08-11 (×2): 81 mg via ORAL
  Filled 2023-08-09 (×2): qty 1

## 2023-08-09 MED ORDER — ALPRAZOLAM 0.25 MG PO TABS
0.2500 mg | ORAL_TABLET | Freq: Every day | ORAL | Status: DC
  Administered 2023-08-09 – 2023-08-10 (×2): 0.25 mg via ORAL
  Filled 2023-08-09 (×2): qty 1

## 2023-08-09 MED ORDER — CEFAZOLIN SODIUM-DEXTROSE 2-4 GM/100ML-% IV SOLN
2.0000 g | INTRAVENOUS | Status: AC
  Administered 2023-08-09: 2 g via INTRAVENOUS

## 2023-08-09 MED ORDER — MIDAZOLAM HCL 2 MG/ML PO SYRP: 8.0000 mg | ORAL_SOLUTION | Freq: Once | ORAL | Status: DC | PRN

## 2023-08-09 MED ORDER — LIDOCAINE-EPINEPHRINE (PF) 1 %-1:200000 IJ SOLN
INTRAMUSCULAR | Status: DC | PRN
  Administered 2023-08-09: 10 mL via INTRADERMAL

## 2023-08-09 MED ORDER — HYDROMORPHONE HCL 1 MG/ML IJ SOLN
INTRAMUSCULAR | Status: AC
  Filled 2023-08-09: qty 1

## 2023-08-09 MED ORDER — DIPHENHYDRAMINE HCL 50 MG/ML IJ SOLN: 50.0000 mg | Freq: Once | INTRAMUSCULAR | Status: DC | PRN

## 2023-08-09 MED ORDER — MIDAZOLAM HCL 2 MG/2ML IJ SOLN
INTRAMUSCULAR | Status: DC | PRN
  Administered 2023-08-09: .5 mg via INTRAVENOUS
  Administered 2023-08-09: 1 mg via INTRAVENOUS

## 2023-08-09 MED ORDER — DIPHENHYDRAMINE HCL 25 MG PO CAPS
25.0000 mg | ORAL_CAPSULE | Freq: Every day | ORAL | Status: DC
  Administered 2023-08-09: 25 mg via ORAL
  Filled 2023-08-09 (×3): qty 1

## 2023-08-09 MED ORDER — CEFAZOLIN SODIUM-DEXTROSE 2-4 GM/100ML-% IV SOLN
INTRAVENOUS | Status: AC
  Filled 2023-08-09: qty 100

## 2023-08-09 MED ORDER — SODIUM CHLORIDE 0.9 % IV SOLN
1.0000 mg/h | INTRAVENOUS | Status: AC
  Administered 2023-08-09: 1 mg/h
  Filled 2023-08-09: qty 10

## 2023-08-09 MED ORDER — TAMSULOSIN HCL 0.4 MG PO CAPS
0.4000 mg | ORAL_CAPSULE | Freq: Every day | ORAL | Status: DC
  Administered 2023-08-10 – 2023-08-11 (×2): 0.4 mg via ORAL
  Filled 2023-08-09 (×2): qty 1

## 2023-08-09 MED ORDER — ONDANSETRON HCL 4 MG/2ML IJ SOLN
4.0000 mg | Freq: Four times a day (QID) | INTRAMUSCULAR | Status: DC | PRN
  Filled 2023-08-09: qty 2

## 2023-08-09 MED ORDER — POTASSIUM CHLORIDE CRYS ER 10 MEQ PO TBCR
10.0000 meq | EXTENDED_RELEASE_TABLET | Freq: Every day | ORAL | Status: DC
  Administered 2023-08-10 – 2023-08-11 (×2): 10 meq via ORAL
  Filled 2023-08-09 (×2): qty 1

## 2023-08-09 MED ORDER — HEPARIN SODIUM (PORCINE) 1000 UNIT/ML IJ SOLN
INTRAMUSCULAR | Status: DC | PRN
  Administered 2023-08-09: 5000 [IU] via INTRAVENOUS

## 2023-08-09 MED ORDER — MORPHINE SULFATE (PF) 4 MG/ML IV SOLN: 2.0000 mg | INTRAVENOUS | Status: DC | PRN

## 2023-08-09 MED ORDER — PANTOPRAZOLE SODIUM 40 MG PO TBEC
40.0000 mg | DELAYED_RELEASE_TABLET | Freq: Every day | ORAL | Status: DC
  Administered 2023-08-09 – 2023-08-11 (×3): 40 mg via ORAL
  Filled 2023-08-09 (×3): qty 1

## 2023-08-09 MED ORDER — ALTEPLASE 2 MG IJ SOLR
INTRAMUSCULAR | Status: AC
  Filled 2023-08-09: qty 6

## 2023-08-09 MED ORDER — METHYLPREDNISOLONE SODIUM SUCC 125 MG IJ SOLR: 125.0000 mg | Freq: Once | INTRAMUSCULAR | Status: DC | PRN

## 2023-08-09 MED ORDER — HEPARIN (PORCINE) 25000 UT/250ML-% IV SOLN
INTRAVENOUS | Status: AC
  Administered 2023-08-09: 600 [IU]/h via INTRA_ARTERIAL
  Filled 2023-08-09: qty 250

## 2023-08-09 MED ORDER — SODIUM CHLORIDE 0.9% FLUSH
3.0000 mL | Freq: Two times a day (BID) | INTRAVENOUS | Status: DC
  Administered 2023-08-09 – 2023-08-10 (×3): 3 mL via INTRAVENOUS

## 2023-08-09 MED ORDER — FAMOTIDINE 20 MG PO TABS: 40.0000 mg | ORAL_TABLET | Freq: Once | ORAL | Status: DC | PRN

## 2023-08-09 MED ORDER — HEPARIN SODIUM (PORCINE) 1000 UNIT/ML IJ SOLN
INTRAMUSCULAR | Status: AC
  Filled 2023-08-09: qty 10

## 2023-08-09 MED ORDER — PRAVASTATIN SODIUM 20 MG PO TABS
20.0000 mg | ORAL_TABLET | Freq: Every day | ORAL | Status: DC
  Administered 2023-08-09 – 2023-08-10 (×2): 20 mg via ORAL
  Filled 2023-08-09 (×2): qty 1

## 2023-08-09 MED ORDER — SODIUM CHLORIDE 0.9 % IV SOLN
250.0000 mL | INTRAVENOUS | Status: DC | PRN
  Administered 2023-08-09: 250 mL via INTRAVENOUS

## 2023-08-09 MED ORDER — GABAPENTIN 300 MG PO CAPS
300.0000 mg | ORAL_CAPSULE | Freq: Two times a day (BID) | ORAL | Status: DC
  Administered 2023-08-09 – 2023-08-11 (×4): 300 mg via ORAL
  Filled 2023-08-09 (×4): qty 1

## 2023-08-09 MED ORDER — SODIUM CHLORIDE 0.9% FLUSH: 3.0000 mL | INTRAVENOUS | Status: DC | PRN

## 2023-08-09 MED ORDER — PROSIGHT PO TABS
1.0000 | ORAL_TABLET | Freq: Every day | ORAL | Status: DC
  Administered 2023-08-10 – 2023-08-11 (×2): 1 via ORAL
  Filled 2023-08-09 (×2): qty 1

## 2023-08-09 MED ORDER — VITAMIN B-12 1000 MCG PO TABS
1000.0000 ug | ORAL_TABLET | Freq: Every day | ORAL | Status: DC
  Administered 2023-08-10 – 2023-08-11 (×2): 1000 ug via ORAL
  Filled 2023-08-09 (×2): qty 1

## 2023-08-09 MED ORDER — SODIUM CHLORIDE 0.9 % IV SOLN: INTRAVENOUS | Status: DC

## 2023-08-09 MED ORDER — ACETAMINOPHEN 500 MG PO TABS
1000.0000 mg | ORAL_TABLET | Freq: Four times a day (QID) | ORAL | Status: DC | PRN
  Administered 2023-08-10 (×2): 1000 mg via ORAL
  Filled 2023-08-09 (×2): qty 2

## 2023-08-09 SURGICAL SUPPLY — 21 items
BALLN LUTONIX 5X220X130 (BALLOONS) ×1
BALLN ULTRVRSE 2.5X220X150 (BALLOONS) ×1
BALLOON LUTONIX 5X220X130 (BALLOONS) IMPLANT
BALLOON ULTRVRSE 2.5X220X150 (BALLOONS) IMPLANT
CATH ANGIO 5F PIGTAIL 65CM (CATHETERS) IMPLANT
CATH BEACON 5 .038 100 VERT TP (CATHETERS) IMPLANT
CATH INFUS 135CMX50CM (CATHETERS) IMPLANT
CATH ROTAREX 135 6FR (CATHETERS) IMPLANT
COVER PROBE ULTRASOUND 5X96 (MISCELLANEOUS) IMPLANT
GLIDEWIRE ADV .035X260CM (WIRE) IMPLANT
KIT CV MULTILUMEN 7FR 20 (SET/KITS/TRAYS/PACK) ×1
KIT CV MULTILUMEN 7FR 20 SUB (SET/KITS/TRAYS/PACK) IMPLANT
KIT ENCORE 26 ADVANTAGE (KITS) IMPLANT
PACK ANGIOGRAPHY (CUSTOM PROCEDURE TRAY) ×1 IMPLANT
SHEATH ANL2 6FRX45 HC (SHEATH) IMPLANT
SHEATH BRITE TIP 5FRX11 (SHEATH) IMPLANT
SUT PROLENE 0 CT 1 30 (SUTURE) IMPLANT
SYR MEDRAD MARK 7 150ML (SYRINGE) IMPLANT
TUBING CONTRAST HIGH PRESS 72 (TUBING) IMPLANT
WIRE G V18X300CM (WIRE) IMPLANT
WIRE GUIDERIGHT .035X150 (WIRE) IMPLANT

## 2023-08-09 NOTE — Op Note (Signed)
[facility]  Percutaneous Study/Intervention Procedural Note   Date of Surgery: 08/09/2023  Surgeon(s):Adalbert Alberto    Assistants:none  Pre-operative Diagnosis: PAD with rest pain right lower extremity with acute on chronic symptoms  Post-operative diagnosis:  Same  Procedure(s) Performed:             1.  Ultrasound guidance for vascular access left femoral artery             2.  Catheter placement into right common femoral artery from left femoral approach             3.  Aortogram and selective right lower extremity angiogram             4.  Mechanical thrombectomy using the [place] Rex device of the right SFA, popliteal artery, and tibioperoneal trunk             5.  Angioplasty of the right peroneal artery with 2.5 mm diameter by 22 cm length angioplasty balloon  6.  Percutaneous transluminal angioplasty of the right SFA and popliteal arteries with 2 inflations with a 5 mm diameter by 22 cm length Lutonix drug-coated angioplasty balloon             7.  Catheter directed thrombolytic therapy with 6 mg of tPA in the right SFA, popliteal artery, tibioperoneal trunk, and proximal peroneal artery and placement of a 135 cm total length 50 cm working length thrombolytic catheter for continuous therapy overnight  8.  Ultrasound guidance for vascular access left femoral vein  9.  Placement of a left femoral venous triple-lumen catheter  EBL: 50 cc  Contrast: 45 cc  Fluoro Time: 6.9 minutes  Moderate Conscious Sedation Time: approximately 44 minutes using 1.5 mg of Versed and 75 mcg of Fentanyl              Indications:  Patient is a 86 y.o.male with worsening rest pain symptoms over the past couple of weeks with a long history of peripheral arterial disease and previous interventions. The patient has noninvasive study showing thrombosis of his previous right SFA and popliteal stents with a markedly reduced right ABI. The patient is brought in for angiography for further  evaluation and potential treatment.  Due to the limb threatening nature of the situation, angiogram was performed for attempted limb salvage. The patient is aware that if the procedure fails, amputation would be expected.  The patient also understands that even with successful revascularization, amputation may still be required due to the severity of the situation.  Risks and benefits are discussed and informed consent is obtained.   Procedure:  The patient was identified and appropriate procedural time out was performed.  The patient was then placed supine on the table and prepped and draped in the usual sterile fashion. Moderate conscious sedation was administered during a face to face encounter with the patient throughout the procedure with my supervision of the RN administering medicines and monitoring the patient's vital signs, pulse oximetry, telemetry and mental status throughout from the start of the procedure until the patient was taken to the recovery room. Ultrasound was used to evaluate the left common femoral artery.  It was patent .  A digital ultrasound image was acquired.  A Seldinger needle was used to access the left common femoral artery under direct ultrasound guidance and a permanent image was performed.  A 0.035 J wire was advanced without resistance and a 5Fr sheath was placed.  Pigtail catheter was placed  into the aorta and an AP aortogram was performed. This demonstrated a very calcific but not stenotic left renal artery.  The right renal artery was not well-seen on this image.  The aorta was quite aneurysmal which was known from his previous duplex studies.  The iliac arteries were heavily calcified but not stenotic. I then crossed the aortic bifurcation and advanced to the right femoral head. Selective right lower extremity angiogram was then performed. This demonstrated patency of the right common femoral artery and profunda femoris artery.  The SFA was patent proximally, but in the  midsegment the SFA was thrombosed at the level of the previously placed stents and the thrombus extended down through the SFA and popliteal arteries.  There were occlusion of all 3 tibial vessels proximally as well with only reconstitution of the proximal to mid peroneal artery that appeared to be continuous distally. It was felt that it was in the patient's best interest to proceed with intervention after these images to avoid a second procedure and a larger amount of contrast and fluoroscopy based off of the findings from the initial angiogram. The patient was systemically heparinized and a 6 [place] Ansell sheath was then placed over the [employer] wire. I then used a Kumpe catheter and the advantage wire to easily cross the thrombus stents in the right SFA and popliteal arteries and then get down into the tibial vessels where I used the Kumpe catheter and the advantage wire to get into the peroneal artery and confirm intraluminal flow and then exchanged for the V18 wire.  Mechanical thrombectomy was then performed with the [place] Rex device.  3 passes were made with the [place] Rex device to treat the thrombus in the right SFA, popliteal artery, and tibioperoneal trunk.  Following this, there was still extremely sluggish flow with no flow through the stents and very sluggish flow distally.  I then proceeded with angioplasty using a 2.5 mm diameter by 22 cm length angioplasty balloon in the peroneal artery and tibioperoneal trunk inflating this up to 10 atm for 1 minute.  A 5 mm diameter by 22 cm length Lutonix drug-coated angioplasty balloon were used to treat the SFA and popliteal arteries with 2 inflations in the 8 to 10 atm range for 1 minute from the mid SFA down to the distal popliteal artery.  Completion imaging showed continued thrombosis with poor flow distally.  Another pass with the [place] Rex device was performed but there remained essentially no flow distally and I felt our best option for restoring  patency would be continuous thrombolytic therapy.  I then selected a 135 cm total length 50 cm working length thrombolytic catheter and parked this in the mid peroneal artery distally up through the popliteal and SFA in the proximal to mid segment to encompass the entire length of the occlusion.  At this point, 6 mg of tPA were instilled through this thrombolytic catheter and it was secured into place as was the sheath for continuous therapy overnight.  2 provide durable venous access for blood draws and infusions, a central line was placed.  The left femoral vein was visualized with ultrasound and accessed without difficulty with a Seldinger needle.  A J-wire was placed.  After dilatation a triple-lumen catheter was placed over the wire and the wire was removed.  All 3 lines with true nonpulsatile dark red blood and flushed easily with sterile saline.  It was secured into place with 2 Prolene sutures. I elected to terminate the procedure.  The patient was taken to the recovery room in stable condition having tolerated the procedure well.  Findings:               Aortogram:  This demonstrated a very calcific but not stenotic left renal artery.  The right renal artery was not well-seen on this image.  The aorta was quite aneurysmal which was known from his previous duplex studies.  The iliac arteries were heavily calcified but not stenotic             Right lower Extremity:  This demonstrated patency of the right common femoral artery and profunda femoris artery.  The SFA was patent proximally, but in the midsegment the SFA was thrombosed at the level of the previously placed stents and the thrombus extended down through the SFA and popliteal arteries.  There were occlusion of all 3 tibial vessels proximally as well with only reconstitution of the proximal to mid peroneal artery that appeared to be continuous distally.   Disposition: Patient was taken to the recovery room in stable condition having tolerated  the procedure well.  Complications: None  Festus Barren 08/09/2023 9:38 AM   This note was created with Dragon Medical transcription system. Any errors in dictation are purely unintentional.

## 2023-08-09 NOTE — Interval H&P Note (Signed)
History and Physical Interval Note:  08/09/2023 9:32 AM  Bryan Welch  has presented today for surgery, with the diagnosis of RLE Angio    Peripheral artery disease w ulceration.  The various methods of treatment have been discussed with the patient and family. After consideration of risks, benefits and other options for treatment, the patient has consented to  Procedure(s): Lower Extremity Angiography (Right) as a surgical intervention.  [name] history has been reviewed, patient examined, no change in status, stable for surgery.  I have reviewed the patient's chart and labs.  Questions were answered to the patient's satisfaction.     Festus Barren

## 2023-08-10 ENCOUNTER — Encounter
Admission: AD | Disposition: A | Discharge status: Home / Self Care | Payer: Self-pay | Source: Home / Self Care | Attending: Vascular Surgery

## 2023-08-10 ENCOUNTER — Encounter: Payer: Self-pay | Admitting: Vascular Surgery

## 2023-08-10 DIAGNOSIS — T82856A Stenosis of peripheral vascular stent, initial encounter: Secondary | ICD-10-CM

## 2023-08-10 DIAGNOSIS — Z9889 Other specified postprocedural states: Secondary | ICD-10-CM

## 2023-08-10 HISTORY — PX: LOWER EXTREMITY ANGIOGRAPHY: CATH118251

## 2023-08-10 LAB — FIBRINOGEN: Fibrinogen: 390 mg/dL (ref 210–475)

## 2023-08-10 LAB — CBC
HCT: 35.7 % — ABNORMAL LOW (ref 39.0–52.0)
Hemoglobin: 12 g/dL — ABNORMAL LOW (ref 13.0–17.0)
MCH: 27.9 pg (ref 26.0–34.0)
MCHC: 33.6 g/dL (ref 30.0–36.0)
MCV: 83 fL (ref 80.0–100.0)
Platelets: 156 10*3/uL (ref 150–400)
RBC: 4.3 MIL/uL (ref 4.22–5.81)
RDW: 15.5 % (ref 11.5–15.5)
WBC: 9 10*3/uL (ref 4.0–10.5)
nRBC: 0 % (ref 0.0–0.2)

## 2023-08-10 LAB — HEPARIN LEVEL (UNFRACTIONATED): Heparin Unfractionated: 0.39 [IU]/mL (ref 0.30–0.70)

## 2023-08-10 SURGERY — LOWER EXTREMITY ANGIOGRAPHY
Anesthesia: Moderate Sedation | Laterality: Right

## 2023-08-10 MED ORDER — MIDAZOLAM HCL 5 MG/5ML IJ SOLN
INTRAMUSCULAR | Status: AC
  Filled 2023-08-10: qty 5

## 2023-08-10 MED ORDER — CEFAZOLIN SODIUM-DEXTROSE 2-4 GM/100ML-% IV SOLN
INTRAVENOUS | Status: AC
  Filled 2023-08-10: qty 100

## 2023-08-10 MED ORDER — TIROFIBAN (AGGRASTAT) BOLUS VIA INFUSION
25.0000 ug/kg | Freq: Once | INTRAVENOUS | Status: AC
  Filled 2023-08-10: qty 30

## 2023-08-10 MED ORDER — HEPARIN SODIUM (PORCINE) 1000 UNIT/ML IJ SOLN
INTRAMUSCULAR | Status: AC
  Filled 2023-08-10: qty 10

## 2023-08-10 MED ORDER — TIROFIBAN HCL IV 12.5 MG/250 ML
INTRAVENOUS | Status: AC
  Administered 2023-08-10: 1485 ug via INTRAVENOUS
  Filled 2023-08-10: qty 250

## 2023-08-10 MED ORDER — MIDAZOLAM HCL 2 MG/2ML IJ SOLN
INTRAMUSCULAR | Status: DC | PRN
  Administered 2023-08-10: .5 mg via INTRAVENOUS
  Administered 2023-08-10: 1 mg via INTRAVENOUS

## 2023-08-10 MED ORDER — NITROGLYCERIN 1 MG/10 ML FOR IR/CATH LAB
INTRA_ARTERIAL | Status: DC | PRN
  Administered 2023-08-10: 300 ug via INTRA_ARTERIAL

## 2023-08-10 MED ORDER — SODIUM CHLORIDE 0.9 % IV SOLN: Freq: Once | INTRAVENOUS | Status: AC

## 2023-08-10 MED ORDER — TIROFIBAN HCL IN NACL 5-0.9 MG/100ML-% IV SOLN
0.1500 ug/kg/min | INTRAVENOUS | Status: AC
  Filled 2023-08-10 (×2): qty 100

## 2023-08-10 MED ORDER — HEPARIN SODIUM (PORCINE) 1000 UNIT/ML IJ SOLN
INTRAMUSCULAR | Status: DC | PRN
  Administered 2023-08-10: 3000 [IU] via INTRAVENOUS

## 2023-08-10 MED ORDER — CHLORHEXIDINE GLUCONATE CLOTH 2 % EX PADS
6.0000 | MEDICATED_PAD | Freq: Every day | CUTANEOUS | Status: DC
  Administered 2023-08-10 – 2023-08-11 (×3): 6 via TOPICAL

## 2023-08-10 MED ORDER — LIDOCAINE-EPINEPHRINE (PF) 1 %-1:200000 IJ SOLN
INTRAMUSCULAR | Status: DC | PRN
  Administered 2023-08-10: 10 mL

## 2023-08-10 MED ORDER — FENTANYL CITRATE (PF) 100 MCG/2ML IJ SOLN
INTRAMUSCULAR | Status: AC
  Filled 2023-08-10: qty 2

## 2023-08-10 MED ORDER — IODIXANOL 320 MG/ML IV SOLN
INTRAVENOUS | Status: DC | PRN
  Administered 2023-08-09: 45 mL

## 2023-08-10 MED ORDER — HEPARIN (PORCINE) IN NACL 1000-0.9 UT/500ML-% IV SOLN
INTRAVENOUS | Status: DC | PRN
  Administered 2023-08-10: 1000 mL

## 2023-08-10 MED ORDER — NITROGLYCERIN 1 MG/10 ML FOR IR/CATH LAB
INTRA_ARTERIAL | Status: AC
  Filled 2023-08-10: qty 10

## 2023-08-10 MED ORDER — IODIXANOL 320 MG/ML IV SOLN
INTRAVENOUS | Status: DC | PRN
  Administered 2023-08-10: 40 mL

## 2023-08-10 MED ORDER — FENTANYL CITRATE (PF) 100 MCG/2ML IJ SOLN
INTRAMUSCULAR | Status: DC | PRN
  Administered 2023-08-10: 25 ug via INTRAVENOUS
  Administered 2023-08-10: 50 ug via INTRAVENOUS

## 2023-08-10 SURGICAL SUPPLY — 15 items
BALLN LUTONIX 018 4X80X130 (BALLOONS) ×1
BALLN LUTONIX DCB 5X100X130 (BALLOONS) ×1
BALLN ULTRVRSE 018 2.5X150X150 (BALLOONS) ×1
BALLOON LUTONIX 018 4X80X130 (BALLOONS) IMPLANT
BALLOON LUTONIX DCB 5X100X130 (BALLOONS) IMPLANT
BALLOON ULTRVS 018 2.5X150X150 (BALLOONS) IMPLANT
CANISTER PENUMBRA ENGINE (MISCELLANEOUS) IMPLANT
CATH INDIGO CAT6 KIT (CATHETERS) IMPLANT
DEVICE STARCLOSE SE CLOSURE (Vascular Products) IMPLANT
KIT ENCORE 26 ADVANTAGE (KITS) IMPLANT
PACK ANGIOGRAPHY (CUSTOM PROCEDURE TRAY) ×1 IMPLANT
STENT VIABAHN 6X100X120 (Permanent Stent) IMPLANT
STENT VIABAHN 6X7.5X120 (Permanent Stent) IMPLANT
WIRE G V18X300CM (WIRE) IMPLANT
WIRE GUIDERIGHT .035X150 (WIRE) IMPLANT

## 2023-08-10 NOTE — Plan of Care (Signed)
  Problem: Coping: Goal: Psychosocial and spiritual needs will be supported Outcome: Progressing   Problem: Respiratory: Goal: Will maintain a patent airway Outcome: Progressing Goal: Complications related to the disease process, condition or treatment will be avoided or minimized Outcome: Progressing   Problem: Fluid Volume: Goal: Hemodynamic stability will improve Outcome: Progressing   Problem: Respiratory: Goal: Ability to maintain adequate ventilation will improve Outcome: Progressing   Problem: Education: Goal: Knowledge of General Education information will improve Description: Including pain rating scale, medication(s)/side effects and non-pharmacologic comfort measures Outcome: Progressing   Problem: Health Behavior/Discharge Planning: Goal: Ability to manage health-related needs will improve Outcome: Progressing

## 2023-08-10 NOTE — Interval H&P Note (Signed)
History and Physical Interval Note:  08/10/2023 7:57 AM  Bryan Welch  has presented today for surgery, with the diagnosis of PAD with Lysis Catheter.  The various methods of treatment have been discussed with the patient and family. After consideration of risks, benefits and other options for treatment, the patient has consented to  Procedure(s): Lower Extremity Angiography (Right) as a surgical intervention.  [name] history has been reviewed, patient examined, no change in status, stable for surgery.  I have reviewed the patient's chart and labs.  Questions were answered to the patient's satisfaction.     Festus Barren

## 2023-08-10 NOTE — Op Note (Signed)
[facility]  Percutaneous Study/Intervention Procedural Note   Date of Surgery: 08/10/2023  Surgeon(s):Jasmine Maceachern    Assistants:none  Pre-operative Diagnosis: PAD with rest pain, s/p overnight thrombolytic therapy  Post-operative diagnosis:  Same  Procedure(s) Performed:             1.  Right lower extremity angiogram through existing catheter             2.  Mechanical thrombectomy of the right SFA, popliteal artery, tibioperoneal trunk, and peroneal artery with the penumbra CAT 6 device             3.  Stent placement to the right SFA with 6 mm diameter by 10 cm length Viabahn stent and stent placement to the right popliteal artery with 6 mm diameter by 7.5 cm length Viabahn stent             4.  Percutaneous transluminal angioplasty of peroneal artery and tibioperoneal trunk with 2.5 mm diameter by 15 cm length angioplasty balloon inflated twice             5.  StarClose closure device left femoral artery  EBL: 150 cc  Contrast: 40 cc  Fluoro Time: 4.8 minutes  Moderate Conscious Sedation Time: approximately 38 minutes using 1.5 mg of Versed and 75 mcg of Fentanyl              Indications:  Patient is a 87 y.o.male with rest pain status post overnight thrombolytic therapy.The patient is brought in for angiography for further evaluation and potential treatment.  Due to the limb threatening nature of the situation, angiogram was performed for attempted limb salvage. The patient is aware that if the procedure fails, amputation would be expected.  The patient also understands that even with successful revascularization, amputation may still be required due to the severity of the situation.  Risks and benefits are discussed and informed consent is obtained.   Procedure:  The patient was identified and appropriate procedural time out was performed.  The patient was then placed supine on the table and prepped and draped in the usual sterile fashion. Moderate  conscious sedation was administered during a face to face encounter with the patient throughout the procedure with my supervision of the RN administering medicines and monitoring the patient's vital signs, pulse oximetry, telemetry and mental status throughout from the start of the procedure until the patient was taken to the recovery room.  The existing lysis catheter was removed and a V18 wire was placed.  Selective right lower extremity angiogram was then performed. This demonstrated stenosis and thrombus at the proximal edge of the previously placed stent in the proximal to mid SFA.  Much of the stent was then patent down to the distal stent where there was thrombus within the stent as well as beyond the stent.  The thrombus extended down into the tibioperoneal trunk and was fairly extensive and nearly occlusive at this location.  The peroneal artery was the dominant runoff in the first 8 to 10 cm of the peroneal artery appeared to be patent and then it occluded abruptly which was likely also thrombus distally. It was felt that it was in the patient's best interest to proceed with intervention after these images to avoid a second procedure and a larger amount of contrast and fluoroscopy based off of the findings from the initial angiogram. The patient was systemically heparinized and I proceeded with thrombectomy due to the extensive amount of thrombus residual.  The penumbra CAT 6 device was brought onto the field and 3 passes were made in the right SFA, popliteal artery, tibioperoneal trunk, and peroneal artery with significant thrombus removed.  At the proximal edge of the previously placed stent there was now stenosis with a small amount of residual thrombus and at the distal edge of the previously placed stent was a greater than 70% stenosis with a moderate amount of residual thrombus.  The peroneal artery still remained occluded in the mid to distal segment.  I elected to place covered stents proximally  and distally to take care of the stenosis and residual thrombus and a 6 mm diameter by 10 cm length Viabahn stent was placed proximally and a 6 mm diameter by 7.5 cm length Viabahn stent was placed distally in the popliteal artery just above a large collateral which was likely near the origin of the anterior tibial artery.  These were postdilated with 5 mm balloons proximally and 6 mm Lutonix balloon distally with excellent angiographic completion result and markedly improved flow with less than 15% residual stenosis in both locations.  The peroneal artery was then addressed with 2 inflations with a 2.5 mm diameter by 15 cm length angioplasty balloon with each inflation being 8 to 10 atm.  Completion imaging now showed some spasm in the distal peroneal artery but markedly improved flow and less than 20% residual stenosis in the peroneal artery and the areas treated I elected to terminate the procedure. The sheath was removed and StarClose closure device was deployed in the left femoral artery with excellent hemostatic result. The patient was taken to the recovery room in stable condition having tolerated the procedure well.  Findings:                            Right lower Extremity:  This demonstrated stenosis and thrombus at the proximal edge of the previously placed stent in the proximal to mid SFA.  Much of the stent was then patent down to the distal stent where there was thrombus within the stent as well as beyond the stent.  The thrombus extended down into the tibioperoneal trunk and was fairly extensive and nearly occlusive at this location.  The peroneal artery was the dominant runoff in the first 8 to 10 cm of the peroneal artery appeared to be patent and then it occluded abruptly which was likely also thrombus distally   Disposition: Patient was taken to the recovery room in stable condition having tolerated the procedure well.  Complications: None  Festus Barren 08/10/2023 8:50 AM   This note  was created with Dragon Medical transcription system. Any errors in dictation are purely unintentional.

## 2023-08-11 LAB — CBC WITH DIFFERENTIAL/PLATELET
Abs Immature Granulocytes: 0.05 10*3/uL (ref 0.00–0.07)
Basophils Absolute: 0.1 10*3/uL (ref 0.0–0.1)
Basophils Relative: 1 %
Eosinophils Absolute: 0.2 10*3/uL (ref 0.0–0.5)
Eosinophils Relative: 2 %
HCT: 35.5 % — ABNORMAL LOW (ref 39.0–52.0)
Hemoglobin: 11.7 g/dL — ABNORMAL LOW (ref 13.0–17.0)
Immature Granulocytes: 1 %
Lymphocytes Relative: 7 %
Lymphs Abs: 0.8 10*3/uL (ref 0.7–4.0)
MCH: 28.1 pg (ref 26.0–34.0)
MCHC: 33 g/dL (ref 30.0–36.0)
MCV: 85.3 fL (ref 80.0–100.0)
Monocytes Absolute: 1 10*3/uL (ref 0.1–1.0)
Monocytes Relative: 10 %
Neutro Abs: 8.7 10*3/uL — ABNORMAL HIGH (ref 1.7–7.7)
Neutrophils Relative %: 79 %
Platelets: 130 10*3/uL — ABNORMAL LOW (ref 150–400)
RBC: 4.16 MIL/uL — ABNORMAL LOW (ref 4.22–5.81)
RDW: 15.3 % (ref 11.5–15.5)
WBC: 10.8 10*3/uL — ABNORMAL HIGH (ref 4.0–10.5)
nRBC: 0 % (ref 0.0–0.2)

## 2023-08-11 LAB — BASIC METABOLIC PANEL
Anion gap: 8 (ref 5–15)
BUN: 18 mg/dL (ref 8–23)
CO2: 25 mmol/L (ref 22–32)
Calcium: 8.4 mg/dL — ABNORMAL LOW (ref 8.9–10.3)
Chloride: 103 mmol/L (ref 98–111)
Creatinine, Ser: 1.15 mg/dL (ref 0.61–1.24)
GFR, Estimated: >60 mL/min (ref >60)
Glucose, Bld: 103 mg/dL — ABNORMAL HIGH (ref 70–99)
Potassium: 3.9 mmol/L (ref 3.5–5.1)
Sodium: 136 mmol/L (ref 135–145)

## 2023-08-11 MED ORDER — TRAMADOL HCL 50 MG PO TABS: 50.0000 mg | ORAL_TABLET | Freq: Four times a day (QID) | ORAL | 0 refills | Status: DC | PRN

## 2023-08-11 NOTE — Discharge Summary (Signed)
[facility]    Discharge Summary    Patient ID:  Bryan Welch MRN: [id number] DOB/AGE: 1936-10-01 87 y.o.  Admit date: 08/09/2023 Discharge date: 08/11/2023 Date of Surgery: 08/10/2023 Surgeon: Surgeon(s): Bryan Quaker Marlow Baars, MD  Admission Diagnosis: Atherosclerosis of artery of extremity with rest pain [facility]) [I70.229]  Discharge Diagnoses:  Atherosclerosis of artery of extremity with rest pain [facility]) [I70.229]  Secondary Diagnoses: Past Medical History:  Diagnosis Date   Arthritis    Toes   Coronary artery disease    History of hiatal hernia    Hyperlipidemia    Hypertension    Kidney disorder    "right kidney doesn't work"   Presence of stent in artery    "4 in legs, 1 in neck"   Vascular abnormality    Wears dentures    full upper and lower    Procedure(s): Lower Extremity Angiography  Discharged Condition: good  HPI:  Bryan Welch is an 87 yo male now POD#2 from left lower extremity angiogram with lysis catheter placement.  Patient was taken back to the vascular lab on 08/10/2023 for mechanical thrombectomy of the right SFA, popliteal artery, tibioperoneal trunk and peroneal artery.  He also had a stent placed to the right SFA with angioplasty of the peroneal artery and tibioperoneal trunk.  This morning in bed patient is resting comfortably.  He has no complaints.  Patient says pain is well-controlled.  Patient's right groin incision site is clean dry and intact.  Patient is healing and recovering as expected.  Patient denies any chest pain shortness of breath blurred vision or dizziness.  Patient is recovering as expected.  Patient to be discharged today  Patient will be discharged home on aspirin 81 mg daily, Xarelto 20 mg twice daily and pravastatin 20 mg daily.  Hospital Course:  Bryan Welch is a 87 y.o. male is S/P Left Extubated: POD # 0 Physical Exam:  Alert notes x3, no acute distress Face: Symmetrical.  Tongue is  midline. Neck: Trachea is midline.  No swelling or bruising. Cardiovascular: Regular rate and rhythm Pulmonary: Clear to auscultation bilaterally Abdomen: Soft, nontender, nondistended Right groin access: Clean dry and intact.  No swelling or drainage noted Left lower extremity: Thigh soft.  Calf soft.  Extremities warm distally toes. Hard to palpate pedal pulses however the foot is warm is her good capillary refill. Right lower extremity: Thigh soft.  Calf soft.  Extremities warm distally toes. Hard to palpate pedal pulses however the foot is warm is her good capillary refill. Neurological: No deficits noted   Post-op wounds:  clean, dry, intact or healing well  Pt. Ambulating, voiding and taking PO diet without difficulty. Pt pain controlled with PO pain meds.  Labs:  As below  Complications: none  Consults:    Significant Diagnostic Studies: CBC Lab Results  Component Value Date   WBC 10.8 (H) 08/11/2023   HGB 11.7 (L) 08/11/2023   HCT 35.5 (L) 08/11/2023   MCV 85.3 08/11/2023   PLT 130 (L) 08/11/2023    BMET    Component Value Date/Time   NA 136 08/11/2023 0623   NA 141 03/22/2013 0237   K 3.9 08/11/2023 0623   K 4.3 03/22/2013 0237   CL 103 08/11/2023 0623   CL 110 (H) 03/22/2013 0237   CO2 25 08/11/2023 0623   CO2 24 03/22/2013 0237   GLUCOSE 103 (H) 08/11/2023 0623   GLUCOSE 101 (H) 03/22/2013 0237   BUN 18 08/11/2023  1610   BUN 19 (H) 03/22/2013 0237   CREATININE 1.15 08/11/2023 0623   CREATININE 1.53 (H) 03/22/2013 0237   CALCIUM 8.4 (L) 08/11/2023 0623   CALCIUM 8.1 (L) 03/22/2013 0237   GFRNONAA >60 08/11/2023 0623   GFRNONAA 44 (L) 03/22/2013 0237   GFRAA 43 (L) 11/21/2019 0911   GFRAA 51 (L) 03/22/2013 0237   COAG Lab Results  Component Value Date   INR 1.2 06/08/2023   INR 2.5 (H) 05/08/2023   INR 2.5 (H) 03/22/2023     Disposition:  Discharge to :Home  Allergies as of 08/11/2023       Reactions   Codeine Itching         Medication List     TAKE these medications    acetaminophen 500 MG tablet Commonly known as: TYLENOL Take 1,000 mg by mouth at bedtime.   ALPRAZolam 0.25 MG tablet Commonly known as: XANAX Take 0.25 mg by mouth at bedtime.   aspirin EC 81 MG tablet Take 81 mg daily by mouth.   Cholecalciferol 25 MCG (1000 UT) capsule Take 2,000 Units by mouth daily.   cyanocobalamin 1000 MCG tablet Take 1 tablet (1,000 mcg total) by mouth daily.   diphenhydrAMINE 25 mg capsule Commonly known as: BENADRYL Take 25 mg at bedtime by mouth.   gabapentin 300 MG capsule Commonly known as: NEURONTIN Take 300 mg by mouth 2 (two) times daily.   hydrochlorothiazide 25 MG tablet Commonly known as: HYDRODIURIL Take 1 tablet (25 mg total) by mouth daily.   hydrochlorothiazide 25 MG tablet Commonly known as: HYDRODIURIL Take 1 tablet by mouth daily.   omeprazole 20 MG capsule Commonly known as: PRILOSEC Take 20 mg by mouth at bedtime.   potassium chloride 10 MEQ tablet Commonly known as: KLOR-CON Take 10 mEq by mouth daily.   pravastatin 20 MG tablet Commonly known as: PRAVACHOL Take 20 mg by mouth at bedtime.   PRESERVISION AREDS 2 PO Take 1 tablet by mouth 2 (two) times daily.   tamsulosin 0.4 MG Caps capsule Commonly known as: FLOMAX Take 0.4 mg by mouth daily.   traMADol 50 MG tablet Commonly known as: ULTRAM Take 1 tablet (50 mg total) by mouth every 6 (six) hours as needed for moderate pain.   Xarelto 20 MG Tabs tablet Generic drug: rivaroxaban Take 20 mg by mouth daily.       Verbal and written Discharge instructions given to the patient. Wound care per Discharge AVS  Follow-up Information     Dew, Marlow Baars, MD Follow up in 2 week(s).   Specialties: Vascular Surgery, Radiology, Interventional Cardiology Why: Left Lower Extremity U/S Duplex arterial doppler with abi Contact information: [address] [place] [address] [phone]                  Signed: Marcie Bal, NP  08/11/2023, 9:29 AM

## 2023-08-11 NOTE — Plan of Care (Signed)
  Problem: Coping: Goal: Psychosocial and spiritual needs will be supported Outcome: Progressing   Problem: Respiratory: Goal: Will maintain a patent airway Outcome: Progressing   Problem: Fluid Volume: Goal: Hemodynamic stability will improve Outcome: Progressing   Problem: Education: Goal: Knowledge of General Education information will improve Description: Including pain rating scale, medication(s)/side effects and non-pharmacologic comfort measures Outcome: Progressing

## 2023-08-11 NOTE — Discharge Instructions (Signed)
Do not lift anything heavy.  Do not lift anything more than a gallon of milk.  You may shower tomorrow Saturday, 08/12/2023 when you get home.  Please leave the old dressing in your groin in place while showering.  After shower remove the dressing and cover the incision site with a Band-Aid.  Please do this for another 3 days.  No driving for the next 2 weeks.  Please do not drive while taking any narcotic medication if needed.  Follow-up with vein and vascular surgery as scheduled.

## 2023-08-14 ENCOUNTER — Telehealth (INDEPENDENT_AMBULATORY_CARE_PROVIDER_SITE_OTHER): Payer: Self-pay

## 2023-08-14 NOTE — Telephone Encounter (Signed)
No, given his hospitalization and the fact he didn't have adequate blood flow for time, it is not surprising that he has weakness.  Additionally in reviewing his chart it also looks like he had some anemia which could also cause some weakness.  What would be more concerning or sign of danger would be pain or coldness of the extremity that he did not have prior to his intervention.  In this instance given his age and his hospitalization, physical therapy would be in his best interest to help with regaining his strength.

## 2023-08-14 NOTE — Telephone Encounter (Signed)
Patient had a right leg angio done 08/09/23 and discharge 08/11/23. Patient needed help getting into the house with he use of a cane, over the weekend he told his son he has no strength in his legs. The son is wondering does he need PT because of the loss of muscle mass that may have took place before surgery. Lea (daughter-n-law ) stated he was able to get around before surgery.   Casimiro Needle and Clint Lipps are currently out of town and would like to be contacted on Lea's cell- [phone]   Please advise

## 2023-08-14 NOTE — Telephone Encounter (Signed)
Bryan Welch agreed that PT would be a good idea for him. He also asked to make sure that everything is fine and the weakness isn't a sign of danger?   Please advise

## 2023-08-14 NOTE — Telephone Encounter (Signed)
PT is not a bad idea in this instance, we can try to see if we can get home PT for him

## 2023-08-15 NOTE — Telephone Encounter (Signed)
Message given to [facility]

## 2023-08-29 ENCOUNTER — Other Ambulatory Visit (INDEPENDENT_AMBULATORY_CARE_PROVIDER_SITE_OTHER): Payer: Self-pay | Admitting: Vascular Surgery

## 2023-08-29 DIAGNOSIS — I739 Peripheral vascular disease, unspecified: Secondary | ICD-10-CM

## 2023-08-30 ENCOUNTER — Encounter (INDEPENDENT_AMBULATORY_CARE_PROVIDER_SITE_OTHER): Payer: Medicare Other

## 2023-08-30 ENCOUNTER — Ambulatory Visit (INDEPENDENT_AMBULATORY_CARE_PROVIDER_SITE_OTHER): Payer: Medicare Other

## 2023-08-30 DIAGNOSIS — I739 Peripheral vascular disease, unspecified: Secondary | ICD-10-CM

## 2023-08-30 DIAGNOSIS — Z9889 Other specified postprocedural states: Secondary | ICD-10-CM | POA: Diagnosis not present

## 2023-08-31 LAB — VAS US ABI WITH/WO TBI
Left ABI: 0.66
Right ABI: 0.59

## 2023-09-05 ENCOUNTER — Ambulatory Visit (INDEPENDENT_AMBULATORY_CARE_PROVIDER_SITE_OTHER): Payer: Medicare Other | Admitting: Vascular Surgery

## 2023-09-05 ENCOUNTER — Encounter (INDEPENDENT_AMBULATORY_CARE_PROVIDER_SITE_OTHER): Payer: Self-pay | Admitting: Vascular Surgery

## 2023-09-05 VITALS — BP 123/65 | HR 77 | Resp 18 | Ht 66.0 in | Wt 135.8 lb

## 2023-09-05 DIAGNOSIS — I7143 Infrarenal abdominal aortic aneurysm, without rupture: Secondary | ICD-10-CM | POA: Diagnosis not present

## 2023-09-05 DIAGNOSIS — E785 Hyperlipidemia, unspecified: Secondary | ICD-10-CM

## 2023-09-05 DIAGNOSIS — I6523 Occlusion and stenosis of bilateral carotid arteries: Secondary | ICD-10-CM

## 2023-09-05 DIAGNOSIS — I10 Essential (primary) hypertension: Secondary | ICD-10-CM

## 2023-09-05 DIAGNOSIS — I70229 Atherosclerosis of native arteries of extremities with rest pain, unspecified extremity: Secondary | ICD-10-CM | POA: Diagnosis not present

## 2023-09-05 DIAGNOSIS — N1831 Chronic kidney disease, stage 3a: Secondary | ICD-10-CM

## 2023-09-05 DIAGNOSIS — I7025 Atherosclerosis of native arteries of other extremities with ulceration: Secondary | ICD-10-CM

## 2023-09-05 NOTE — Assessment & Plan Note (Signed)
Recently checked and stable at about 4 cm but could be a source of peripheral embolization.

## 2023-09-05 NOTE — Progress Notes (Signed)
CBC with Differential/Platelet     Status: Abnormal   Collection Time: 08/11/23  6:23 AM  Result Value Ref Range   WBC 10.8 (H) 4.0 - 10.5 K/uL   RBC 4.16 (L) 4.22 - 5.81 MIL/uL   Hemoglobin 11.7 (L) 13.0 - 17.0 g/dL   HCT 24.4 (L) 01.0 - 27.2 %   MCV 85.3 80.0 - 100.0 fL   MCH 28.1 26.0 - 34.0 pg   MCHC 33.0 30.0 - 36.0 g/dL   RDW 53.6 64.4 - 03.4 %   Platelets 130 (L) 150 - 400 K/uL   nRBC 0.0 0.0 - 0.2 %   Neutrophils Relative %  79 %   Neutro Abs 8.7 (H) 1.7 - 7.7 K/uL   Lymphocytes Relative 7 %   Lymphs Abs 0.8 0.7 - 4.0 K/uL   Monocytes Relative 10 %   Monocytes Absolute 1.0 0.1 - 1.0 K/uL   Eosinophils Relative 2 %   Eosinophils Absolute 0.2 0.0 - 0.5 K/uL   Basophils Relative 1 %   Basophils Absolute 0.1 0.0 - 0.1 K/uL   Immature Granulocytes 1 %   Abs Immature Granulocytes 0.05 0.00 - 0.07 K/uL    Comment: Performed at [facility], [address], [place], [place] [address]  Basic metabolic panel     Status: Abnormal   Collection Time: 08/11/23  6:23 AM  Result Value Ref Range   Sodium 136 135 - 145 mmol/L   Potassium 3.9 3.5 - 5.1 mmol/L   Chloride 103 98 - 111 mmol/L   CO2 25 22 - 32 mmol/L   Glucose, Bld 103 (H) 70 - 99 mg/dL    Comment: Glucose reference range applies only to samples taken after fasting for at least 8 hours.   BUN 18 8 - 23 mg/dL   Creatinine, Ser 5.63 0.61 - 1.24 mg/dL   Calcium 8.4 (L) 8.9 - 10.3 mg/dL   GFR, Estimated >87 >56 mL/min    Comment: (NOTE) Calculated using the CKD-EPI Creatinine Equation (2021)    Anion gap 8 5 - 15    Comment: Performed at [facility], [address] [place], [place] [address]  VAS [place] ABI WITH/WO TBI     Status: None   Collection Time: 08/30/23 10:49 AM  Result Value Ref Range   Right ABI 0.59    Left ABI 0.66     Radiology VAS [place] ABI WITH/WO TBI  Result Date: 08/31/2023  LOWER EXTREMITY DOPPLER STUDY Patient Name:  Bryan Welch  Date of Exam:   08/30/2023 Medical Rec #: [id number]          Accession #:    [id number] Date of Birth: 31-Jul-1936          Patient Gender: M Patient Age:   87 years Exam Location:  [facility] Procedure:      VAS [place] ABI WITH/WO TBI Referring Phys: Barbara Cower Madaleine Simmon --------------------------------------------------------------------------------  Indications: Peripheral artery disease. High Risk Factors: Hypertension, hyperlipidemia, past history of smoking,                    coronary  artery disease.  Vascular Interventions: 08/09/2023 Thrombectomy, PTA and lysis SFA to peroneal                         08/10/2023 SFA stent and tib per PTA. Performing Technologist: Hardie Lora RVT  Examination Guidelines: A complete evaluation includes at minimum, Doppler waveform signals and systolic  CBC with Differential/Platelet     Status: Abnormal   Collection Time: 08/11/23  6:23 AM  Result Value Ref Range   WBC 10.8 (H) 4.0 - 10.5 K/uL   RBC 4.16 (L) 4.22 - 5.81 MIL/uL   Hemoglobin 11.7 (L) 13.0 - 17.0 g/dL   HCT 24.4 (L) 01.0 - 27.2 %   MCV 85.3 80.0 - 100.0 fL   MCH 28.1 26.0 - 34.0 pg   MCHC 33.0 30.0 - 36.0 g/dL   RDW 53.6 64.4 - 03.4 %   Platelets 130 (L) 150 - 400 K/uL   nRBC 0.0 0.0 - 0.2 %   Neutrophils Relative %  79 %   Neutro Abs 8.7 (H) 1.7 - 7.7 K/uL   Lymphocytes Relative 7 %   Lymphs Abs 0.8 0.7 - 4.0 K/uL   Monocytes Relative 10 %   Monocytes Absolute 1.0 0.1 - 1.0 K/uL   Eosinophils Relative 2 %   Eosinophils Absolute 0.2 0.0 - 0.5 K/uL   Basophils Relative 1 %   Basophils Absolute 0.1 0.0 - 0.1 K/uL   Immature Granulocytes 1 %   Abs Immature Granulocytes 0.05 0.00 - 0.07 K/uL    Comment: Performed at [facility], [address], [place], [place] [address]  Basic metabolic panel     Status: Abnormal   Collection Time: 08/11/23  6:23 AM  Result Value Ref Range   Sodium 136 135 - 145 mmol/L   Potassium 3.9 3.5 - 5.1 mmol/L   Chloride 103 98 - 111 mmol/L   CO2 25 22 - 32 mmol/L   Glucose, Bld 103 (H) 70 - 99 mg/dL    Comment: Glucose reference range applies only to samples taken after fasting for at least 8 hours.   BUN 18 8 - 23 mg/dL   Creatinine, Ser 5.63 0.61 - 1.24 mg/dL   Calcium 8.4 (L) 8.9 - 10.3 mg/dL   GFR, Estimated >87 >56 mL/min    Comment: (NOTE) Calculated using the CKD-EPI Creatinine Equation (2021)    Anion gap 8 5 - 15    Comment: Performed at [facility], [address] [place], [place] [address]  VAS [place] ABI WITH/WO TBI     Status: None   Collection Time: 08/30/23 10:49 AM  Result Value Ref Range   Right ABI 0.59    Left ABI 0.66     Radiology VAS [place] ABI WITH/WO TBI  Result Date: 08/31/2023  LOWER EXTREMITY DOPPLER STUDY Patient Name:  Bryan Welch  Date of Exam:   08/30/2023 Medical Rec #: [id number]          Accession #:    [id number] Date of Birth: 31-Jul-1936          Patient Gender: M Patient Age:   87 years Exam Location:  [facility] Procedure:      VAS [place] ABI WITH/WO TBI Referring Phys: Barbara Cower Madaleine Simmon --------------------------------------------------------------------------------  Indications: Peripheral artery disease. High Risk Factors: Hypertension, hyperlipidemia, past history of smoking,                    coronary  artery disease.  Vascular Interventions: 08/09/2023 Thrombectomy, PTA and lysis SFA to peroneal                         08/10/2023 SFA stent and tib per PTA. Performing Technologist: Hardie Lora RVT  Examination Guidelines: A complete evaluation includes at minimum, Doppler waveform signals and systolic  CBC with Differential/Platelet     Status: Abnormal   Collection Time: 08/11/23  6:23 AM  Result Value Ref Range   WBC 10.8 (H) 4.0 - 10.5 K/uL   RBC 4.16 (L) 4.22 - 5.81 MIL/uL   Hemoglobin 11.7 (L) 13.0 - 17.0 g/dL   HCT 24.4 (L) 01.0 - 27.2 %   MCV 85.3 80.0 - 100.0 fL   MCH 28.1 26.0 - 34.0 pg   MCHC 33.0 30.0 - 36.0 g/dL   RDW 53.6 64.4 - 03.4 %   Platelets 130 (L) 150 - 400 K/uL   nRBC 0.0 0.0 - 0.2 %   Neutrophils Relative %  79 %   Neutro Abs 8.7 (H) 1.7 - 7.7 K/uL   Lymphocytes Relative 7 %   Lymphs Abs 0.8 0.7 - 4.0 K/uL   Monocytes Relative 10 %   Monocytes Absolute 1.0 0.1 - 1.0 K/uL   Eosinophils Relative 2 %   Eosinophils Absolute 0.2 0.0 - 0.5 K/uL   Basophils Relative 1 %   Basophils Absolute 0.1 0.0 - 0.1 K/uL   Immature Granulocytes 1 %   Abs Immature Granulocytes 0.05 0.00 - 0.07 K/uL    Comment: Performed at [facility], [address], [place], [place] [address]  Basic metabolic panel     Status: Abnormal   Collection Time: 08/11/23  6:23 AM  Result Value Ref Range   Sodium 136 135 - 145 mmol/L   Potassium 3.9 3.5 - 5.1 mmol/L   Chloride 103 98 - 111 mmol/L   CO2 25 22 - 32 mmol/L   Glucose, Bld 103 (H) 70 - 99 mg/dL    Comment: Glucose reference range applies only to samples taken after fasting for at least 8 hours.   BUN 18 8 - 23 mg/dL   Creatinine, Ser 5.63 0.61 - 1.24 mg/dL   Calcium 8.4 (L) 8.9 - 10.3 mg/dL   GFR, Estimated >87 >56 mL/min    Comment: (NOTE) Calculated using the CKD-EPI Creatinine Equation (2021)    Anion gap 8 5 - 15    Comment: Performed at [facility], [address] [place], [place] [address]  VAS [place] ABI WITH/WO TBI     Status: None   Collection Time: 08/30/23 10:49 AM  Result Value Ref Range   Right ABI 0.59    Left ABI 0.66     Radiology VAS [place] ABI WITH/WO TBI  Result Date: 08/31/2023  LOWER EXTREMITY DOPPLER STUDY Patient Name:  Bryan Welch  Date of Exam:   08/30/2023 Medical Rec #: [id number]          Accession #:    [id number] Date of Birth: 31-Jul-1936          Patient Gender: M Patient Age:   87 years Exam Location:  [facility] Procedure:      VAS [place] ABI WITH/WO TBI Referring Phys: Barbara Cower Madaleine Simmon --------------------------------------------------------------------------------  Indications: Peripheral artery disease. High Risk Factors: Hypertension, hyperlipidemia, past history of smoking,                    coronary  artery disease.  Vascular Interventions: 08/09/2023 Thrombectomy, PTA and lysis SFA to peroneal                         08/10/2023 SFA stent and tib per PTA. Performing Technologist: Hardie Lora RVT  Examination Guidelines: A complete evaluation includes at minimum, Doppler waveform signals and systolic  CBC with Differential/Platelet     Status: Abnormal   Collection Time: 08/11/23  6:23 AM  Result Value Ref Range   WBC 10.8 (H) 4.0 - 10.5 K/uL   RBC 4.16 (L) 4.22 - 5.81 MIL/uL   Hemoglobin 11.7 (L) 13.0 - 17.0 g/dL   HCT 24.4 (L) 01.0 - 27.2 %   MCV 85.3 80.0 - 100.0 fL   MCH 28.1 26.0 - 34.0 pg   MCHC 33.0 30.0 - 36.0 g/dL   RDW 53.6 64.4 - 03.4 %   Platelets 130 (L) 150 - 400 K/uL   nRBC 0.0 0.0 - 0.2 %   Neutrophils Relative %  79 %   Neutro Abs 8.7 (H) 1.7 - 7.7 K/uL   Lymphocytes Relative 7 %   Lymphs Abs 0.8 0.7 - 4.0 K/uL   Monocytes Relative 10 %   Monocytes Absolute 1.0 0.1 - 1.0 K/uL   Eosinophils Relative 2 %   Eosinophils Absolute 0.2 0.0 - 0.5 K/uL   Basophils Relative 1 %   Basophils Absolute 0.1 0.0 - 0.1 K/uL   Immature Granulocytes 1 %   Abs Immature Granulocytes 0.05 0.00 - 0.07 K/uL    Comment: Performed at [facility], [address], [place], [place] [address]  Basic metabolic panel     Status: Abnormal   Collection Time: 08/11/23  6:23 AM  Result Value Ref Range   Sodium 136 135 - 145 mmol/L   Potassium 3.9 3.5 - 5.1 mmol/L   Chloride 103 98 - 111 mmol/L   CO2 25 22 - 32 mmol/L   Glucose, Bld 103 (H) 70 - 99 mg/dL    Comment: Glucose reference range applies only to samples taken after fasting for at least 8 hours.   BUN 18 8 - 23 mg/dL   Creatinine, Ser 5.63 0.61 - 1.24 mg/dL   Calcium 8.4 (L) 8.9 - 10.3 mg/dL   GFR, Estimated >87 >56 mL/min    Comment: (NOTE) Calculated using the CKD-EPI Creatinine Equation (2021)    Anion gap 8 5 - 15    Comment: Performed at [facility], [address] [place], [place] [address]  VAS [place] ABI WITH/WO TBI     Status: None   Collection Time: 08/30/23 10:49 AM  Result Value Ref Range   Right ABI 0.59    Left ABI 0.66     Radiology VAS [place] ABI WITH/WO TBI  Result Date: 08/31/2023  LOWER EXTREMITY DOPPLER STUDY Patient Name:  Bryan Welch  Date of Exam:   08/30/2023 Medical Rec #: [id number]          Accession #:    [id number] Date of Birth: 31-Jul-1936          Patient Gender: M Patient Age:   87 years Exam Location:  [facility] Procedure:      VAS [place] ABI WITH/WO TBI Referring Phys: Barbara Cower Madaleine Simmon --------------------------------------------------------------------------------  Indications: Peripheral artery disease. High Risk Factors: Hypertension, hyperlipidemia, past history of smoking,                    coronary  artery disease.  Vascular Interventions: 08/09/2023 Thrombectomy, PTA and lysis SFA to peroneal                         08/10/2023 SFA stent and tib per PTA. Performing Technologist: Hardie Lora RVT  Examination Guidelines: A complete evaluation includes at minimum, Doppler waveform signals and systolic  CBC with Differential/Platelet     Status: Abnormal   Collection Time: 08/11/23  6:23 AM  Result Value Ref Range   WBC 10.8 (H) 4.0 - 10.5 K/uL   RBC 4.16 (L) 4.22 - 5.81 MIL/uL   Hemoglobin 11.7 (L) 13.0 - 17.0 g/dL   HCT 24.4 (L) 01.0 - 27.2 %   MCV 85.3 80.0 - 100.0 fL   MCH 28.1 26.0 - 34.0 pg   MCHC 33.0 30.0 - 36.0 g/dL   RDW 53.6 64.4 - 03.4 %   Platelets 130 (L) 150 - 400 K/uL   nRBC 0.0 0.0 - 0.2 %   Neutrophils Relative %  79 %   Neutro Abs 8.7 (H) 1.7 - 7.7 K/uL   Lymphocytes Relative 7 %   Lymphs Abs 0.8 0.7 - 4.0 K/uL   Monocytes Relative 10 %   Monocytes Absolute 1.0 0.1 - 1.0 K/uL   Eosinophils Relative 2 %   Eosinophils Absolute 0.2 0.0 - 0.5 K/uL   Basophils Relative 1 %   Basophils Absolute 0.1 0.0 - 0.1 K/uL   Immature Granulocytes 1 %   Abs Immature Granulocytes 0.05 0.00 - 0.07 K/uL    Comment: Performed at [facility], [address], [place], [place] [address]  Basic metabolic panel     Status: Abnormal   Collection Time: 08/11/23  6:23 AM  Result Value Ref Range   Sodium 136 135 - 145 mmol/L   Potassium 3.9 3.5 - 5.1 mmol/L   Chloride 103 98 - 111 mmol/L   CO2 25 22 - 32 mmol/L   Glucose, Bld 103 (H) 70 - 99 mg/dL    Comment: Glucose reference range applies only to samples taken after fasting for at least 8 hours.   BUN 18 8 - 23 mg/dL   Creatinine, Ser 5.63 0.61 - 1.24 mg/dL   Calcium 8.4 (L) 8.9 - 10.3 mg/dL   GFR, Estimated >87 >56 mL/min    Comment: (NOTE) Calculated using the CKD-EPI Creatinine Equation (2021)    Anion gap 8 5 - 15    Comment: Performed at [facility], [address] [place], [place] [address]  VAS [place] ABI WITH/WO TBI     Status: None   Collection Time: 08/30/23 10:49 AM  Result Value Ref Range   Right ABI 0.59    Left ABI 0.66     Radiology VAS [place] ABI WITH/WO TBI  Result Date: 08/31/2023  LOWER EXTREMITY DOPPLER STUDY Patient Name:  Bryan Welch  Date of Exam:   08/30/2023 Medical Rec #: [id number]          Accession #:    [id number] Date of Birth: 31-Jul-1936          Patient Gender: M Patient Age:   87 years Exam Location:  [facility] Procedure:      VAS [place] ABI WITH/WO TBI Referring Phys: Barbara Cower Madaleine Simmon --------------------------------------------------------------------------------  Indications: Peripheral artery disease. High Risk Factors: Hypertension, hyperlipidemia, past history of smoking,                    coronary  artery disease.  Vascular Interventions: 08/09/2023 Thrombectomy, PTA and lysis SFA to peroneal                         08/10/2023 SFA stent and tib per PTA. Performing Technologist: Hardie Lora RVT  Examination Guidelines: A complete evaluation includes at minimum, Doppler waveform signals and systolic  CBC with Differential/Platelet     Status: Abnormal   Collection Time: 08/11/23  6:23 AM  Result Value Ref Range   WBC 10.8 (H) 4.0 - 10.5 K/uL   RBC 4.16 (L) 4.22 - 5.81 MIL/uL   Hemoglobin 11.7 (L) 13.0 - 17.0 g/dL   HCT 24.4 (L) 01.0 - 27.2 %   MCV 85.3 80.0 - 100.0 fL   MCH 28.1 26.0 - 34.0 pg   MCHC 33.0 30.0 - 36.0 g/dL   RDW 53.6 64.4 - 03.4 %   Platelets 130 (L) 150 - 400 K/uL   nRBC 0.0 0.0 - 0.2 %   Neutrophils Relative %  79 %   Neutro Abs 8.7 (H) 1.7 - 7.7 K/uL   Lymphocytes Relative 7 %   Lymphs Abs 0.8 0.7 - 4.0 K/uL   Monocytes Relative 10 %   Monocytes Absolute 1.0 0.1 - 1.0 K/uL   Eosinophils Relative 2 %   Eosinophils Absolute 0.2 0.0 - 0.5 K/uL   Basophils Relative 1 %   Basophils Absolute 0.1 0.0 - 0.1 K/uL   Immature Granulocytes 1 %   Abs Immature Granulocytes 0.05 0.00 - 0.07 K/uL    Comment: Performed at [facility], [address], [place], [place] [address]  Basic metabolic panel     Status: Abnormal   Collection Time: 08/11/23  6:23 AM  Result Value Ref Range   Sodium 136 135 - 145 mmol/L   Potassium 3.9 3.5 - 5.1 mmol/L   Chloride 103 98 - 111 mmol/L   CO2 25 22 - 32 mmol/L   Glucose, Bld 103 (H) 70 - 99 mg/dL    Comment: Glucose reference range applies only to samples taken after fasting for at least 8 hours.   BUN 18 8 - 23 mg/dL   Creatinine, Ser 5.63 0.61 - 1.24 mg/dL   Calcium 8.4 (L) 8.9 - 10.3 mg/dL   GFR, Estimated >87 >56 mL/min    Comment: (NOTE) Calculated using the CKD-EPI Creatinine Equation (2021)    Anion gap 8 5 - 15    Comment: Performed at [facility], [address] [place], [place] [address]  VAS [place] ABI WITH/WO TBI     Status: None   Collection Time: 08/30/23 10:49 AM  Result Value Ref Range   Right ABI 0.59    Left ABI 0.66     Radiology VAS [place] ABI WITH/WO TBI  Result Date: 08/31/2023  LOWER EXTREMITY DOPPLER STUDY Patient Name:  Bryan Welch  Date of Exam:   08/30/2023 Medical Rec #: [id number]          Accession #:    [id number] Date of Birth: 31-Jul-1936          Patient Gender: M Patient Age:   87 years Exam Location:  [facility] Procedure:      VAS [place] ABI WITH/WO TBI Referring Phys: Barbara Cower Madaleine Simmon --------------------------------------------------------------------------------  Indications: Peripheral artery disease. High Risk Factors: Hypertension, hyperlipidemia, past history of smoking,                    coronary  artery disease.  Vascular Interventions: 08/09/2023 Thrombectomy, PTA and lysis SFA to peroneal                         08/10/2023 SFA stent and tib per PTA. Performing Technologist: Hardie Lora RVT  Examination Guidelines: A complete evaluation includes at minimum, Doppler waveform signals and systolic  CBC with Differential/Platelet     Status: Abnormal   Collection Time: 08/11/23  6:23 AM  Result Value Ref Range   WBC 10.8 (H) 4.0 - 10.5 K/uL   RBC 4.16 (L) 4.22 - 5.81 MIL/uL   Hemoglobin 11.7 (L) 13.0 - 17.0 g/dL   HCT 24.4 (L) 01.0 - 27.2 %   MCV 85.3 80.0 - 100.0 fL   MCH 28.1 26.0 - 34.0 pg   MCHC 33.0 30.0 - 36.0 g/dL   RDW 53.6 64.4 - 03.4 %   Platelets 130 (L) 150 - 400 K/uL   nRBC 0.0 0.0 - 0.2 %   Neutrophils Relative %  79 %   Neutro Abs 8.7 (H) 1.7 - 7.7 K/uL   Lymphocytes Relative 7 %   Lymphs Abs 0.8 0.7 - 4.0 K/uL   Monocytes Relative 10 %   Monocytes Absolute 1.0 0.1 - 1.0 K/uL   Eosinophils Relative 2 %   Eosinophils Absolute 0.2 0.0 - 0.5 K/uL   Basophils Relative 1 %   Basophils Absolute 0.1 0.0 - 0.1 K/uL   Immature Granulocytes 1 %   Abs Immature Granulocytes 0.05 0.00 - 0.07 K/uL    Comment: Performed at [facility], [address], [place], [place] [address]  Basic metabolic panel     Status: Abnormal   Collection Time: 08/11/23  6:23 AM  Result Value Ref Range   Sodium 136 135 - 145 mmol/L   Potassium 3.9 3.5 - 5.1 mmol/L   Chloride 103 98 - 111 mmol/L   CO2 25 22 - 32 mmol/L   Glucose, Bld 103 (H) 70 - 99 mg/dL    Comment: Glucose reference range applies only to samples taken after fasting for at least 8 hours.   BUN 18 8 - 23 mg/dL   Creatinine, Ser 5.63 0.61 - 1.24 mg/dL   Calcium 8.4 (L) 8.9 - 10.3 mg/dL   GFR, Estimated >87 >56 mL/min    Comment: (NOTE) Calculated using the CKD-EPI Creatinine Equation (2021)    Anion gap 8 5 - 15    Comment: Performed at [facility], [address] [place], [place] [address]  VAS [place] ABI WITH/WO TBI     Status: None   Collection Time: 08/30/23 10:49 AM  Result Value Ref Range   Right ABI 0.59    Left ABI 0.66     Radiology VAS [place] ABI WITH/WO TBI  Result Date: 08/31/2023  LOWER EXTREMITY DOPPLER STUDY Patient Name:  Bryan Welch  Date of Exam:   08/30/2023 Medical Rec #: [id number]          Accession #:    [id number] Date of Birth: 31-Jul-1936          Patient Gender: M Patient Age:   87 years Exam Location:  [facility] Procedure:      VAS [place] ABI WITH/WO TBI Referring Phys: Barbara Cower Madaleine Simmon --------------------------------------------------------------------------------  Indications: Peripheral artery disease. High Risk Factors: Hypertension, hyperlipidemia, past history of smoking,                    coronary  artery disease.  Vascular Interventions: 08/09/2023 Thrombectomy, PTA and lysis SFA to peroneal                         08/10/2023 SFA stent and tib per PTA. Performing Technologist: Hardie Lora RVT  Examination Guidelines: A complete evaluation includes at minimum, Doppler waveform signals and systolic  MRN : [id number]  Bryan Welch is a 87 y.o. (1935/12/31) male who presents with chief complaint of  Chief Complaint  Patient presents with   Follow-up    Follow up with Annice Needy, MD (Vascular Surgery) in 2 weeks (08/25/2023); abi.  Marland Kitchen  History of Present Illness: Patient returns today in follow up of his PAD.  He is about 3 weeks status post extensive right lower extremity revascularization which required overnight thrombolytic therapy.  He is doing well with this.  His leg feels much better.  He had no access related complications.  The wound on his foot has almost completely healed at this point. His wound is almost completely healed after revascularization.  Recent ABIs were 0.59 on the right and 0.66 on the left which are actually up about 20 points from his preprocedure levels.   Current Outpatient Medications  Medication Sig Dispense Refill   acetaminophen (TYLENOL) 500 MG tablet Take 1,000 mg by mouth at bedtime.     ALPRAZolam (XANAX) 0.25 MG tablet Take 0.25 mg by mouth at bedtime.     aspirin EC 81 MG tablet Take 81 mg daily by mouth.      Cholecalciferol 25 MCG (1000 UT) capsule Take 2,000 Units by mouth daily.     diphenhydrAMINE (BENADRYL) 25 mg capsule Take 25 mg at bedtime by mouth.      gabapentin (NEURONTIN) 300 MG capsule Take 300 mg by mouth 2 (two) times daily.     hydrochlorothiazide (HYDRODIURIL) 25 MG tablet Take 1 tablet (25 mg total) by mouth daily. 30 tablet 0   hydrochlorothiazide (HYDRODIURIL) 25 MG tablet Take 1 tablet by mouth daily.     Multiple Vitamins-Minerals (PRESERVISION AREDS 2 PO) Take 1 tablet by mouth 2 (two) times daily.     omeprazole (PRILOSEC) 20 MG capsule Take 20 mg by mouth at bedtime.     potassium chloride (KLOR-CON) 10 MEQ tablet Take 10 mEq by mouth daily.     pravastatin (PRAVACHOL) 20 MG tablet Take 20 mg by mouth at bedtime.     tamsulosin (FLOMAX) 0.4 MG CAPS capsule Take 0.4 mg by mouth daily.  3   traMADol (ULTRAM) 50 MG  tablet Take 1 tablet (50 mg total) by mouth every 6 (six) hours as needed for moderate pain. 16 tablet 0   XARELTO 20 MG TABS tablet Take 20 mg by mouth daily.     No current facility-administered medications for this visit.    Past Medical History:  Diagnosis Date   Arthritis    Toes   Coronary artery disease    History of hiatal hernia    Hyperlipidemia    Hypertension    Kidney disorder    "right kidney doesn't work"   Presence of stent in artery    "4 in legs, 1 in neck"   Vascular abnormality    Wears dentures    full upper and lower    Past Surgical History:  Procedure Laterality Date   AMPUTATION TOE Right 09/16/2021   Procedure: AMPUTATION TOE;  Surgeon: Rosetta Posner, DPM;  Location: [facility];  Service: Podiatry;  Laterality: Right;   BACK SURGERY     CAROTID STENT Right    CATARACT EXTRACTION W/PHACO Left 06/17/2019   Procedure: CATARACT EXTRACTION PHACO AND INTRAOCULAR LENS PLACEMENT (IOC) LEFT;  Surgeon: Nevada Crane, MD;  Location: [facility];  Service: Ophthalmology;  Laterality: Left;   CATARACT EXTRACTION W/PHACO Right 07/08/2019   Procedure:  CBC with Differential/Platelet     Status: Abnormal   Collection Time: 08/11/23  6:23 AM  Result Value Ref Range   WBC 10.8 (H) 4.0 - 10.5 K/uL   RBC 4.16 (L) 4.22 - 5.81 MIL/uL   Hemoglobin 11.7 (L) 13.0 - 17.0 g/dL   HCT 24.4 (L) 01.0 - 27.2 %   MCV 85.3 80.0 - 100.0 fL   MCH 28.1 26.0 - 34.0 pg   MCHC 33.0 30.0 - 36.0 g/dL   RDW 53.6 64.4 - 03.4 %   Platelets 130 (L) 150 - 400 K/uL   nRBC 0.0 0.0 - 0.2 %   Neutrophils Relative %  79 %   Neutro Abs 8.7 (H) 1.7 - 7.7 K/uL   Lymphocytes Relative 7 %   Lymphs Abs 0.8 0.7 - 4.0 K/uL   Monocytes Relative 10 %   Monocytes Absolute 1.0 0.1 - 1.0 K/uL   Eosinophils Relative 2 %   Eosinophils Absolute 0.2 0.0 - 0.5 K/uL   Basophils Relative 1 %   Basophils Absolute 0.1 0.0 - 0.1 K/uL   Immature Granulocytes 1 %   Abs Immature Granulocytes 0.05 0.00 - 0.07 K/uL    Comment: Performed at [facility], [address], [place], [place] [address]  Basic metabolic panel     Status: Abnormal   Collection Time: 08/11/23  6:23 AM  Result Value Ref Range   Sodium 136 135 - 145 mmol/L   Potassium 3.9 3.5 - 5.1 mmol/L   Chloride 103 98 - 111 mmol/L   CO2 25 22 - 32 mmol/L   Glucose, Bld 103 (H) 70 - 99 mg/dL    Comment: Glucose reference range applies only to samples taken after fasting for at least 8 hours.   BUN 18 8 - 23 mg/dL   Creatinine, Ser 5.63 0.61 - 1.24 mg/dL   Calcium 8.4 (L) 8.9 - 10.3 mg/dL   GFR, Estimated >87 >56 mL/min    Comment: (NOTE) Calculated using the CKD-EPI Creatinine Equation (2021)    Anion gap 8 5 - 15    Comment: Performed at [facility], [address] [place], [place] [address]  VAS [place] ABI WITH/WO TBI     Status: None   Collection Time: 08/30/23 10:49 AM  Result Value Ref Range   Right ABI 0.59    Left ABI 0.66     Radiology VAS [place] ABI WITH/WO TBI  Result Date: 08/31/2023  LOWER EXTREMITY DOPPLER STUDY Patient Name:  Bryan Welch  Date of Exam:   08/30/2023 Medical Rec #: [id number]          Accession #:    [id number] Date of Birth: 31-Jul-1936          Patient Gender: M Patient Age:   87 years Exam Location:  [facility] Procedure:      VAS [place] ABI WITH/WO TBI Referring Phys: Barbara Cower Madaleine Simmon --------------------------------------------------------------------------------  Indications: Peripheral artery disease. High Risk Factors: Hypertension, hyperlipidemia, past history of smoking,                    coronary  artery disease.  Vascular Interventions: 08/09/2023 Thrombectomy, PTA and lysis SFA to peroneal                         08/10/2023 SFA stent and tib per PTA. Performing Technologist: Hardie Lora RVT  Examination Guidelines: A complete evaluation includes at minimum, Doppler waveform signals and systolic  CBC with Differential/Platelet     Status: Abnormal   Collection Time: 08/11/23  6:23 AM  Result Value Ref Range   WBC 10.8 (H) 4.0 - 10.5 K/uL   RBC 4.16 (L) 4.22 - 5.81 MIL/uL   Hemoglobin 11.7 (L) 13.0 - 17.0 g/dL   HCT 24.4 (L) 01.0 - 27.2 %   MCV 85.3 80.0 - 100.0 fL   MCH 28.1 26.0 - 34.0 pg   MCHC 33.0 30.0 - 36.0 g/dL   RDW 53.6 64.4 - 03.4 %   Platelets 130 (L) 150 - 400 K/uL   nRBC 0.0 0.0 - 0.2 %   Neutrophils Relative %  79 %   Neutro Abs 8.7 (H) 1.7 - 7.7 K/uL   Lymphocytes Relative 7 %   Lymphs Abs 0.8 0.7 - 4.0 K/uL   Monocytes Relative 10 %   Monocytes Absolute 1.0 0.1 - 1.0 K/uL   Eosinophils Relative 2 %   Eosinophils Absolute 0.2 0.0 - 0.5 K/uL   Basophils Relative 1 %   Basophils Absolute 0.1 0.0 - 0.1 K/uL   Immature Granulocytes 1 %   Abs Immature Granulocytes 0.05 0.00 - 0.07 K/uL    Comment: Performed at [facility], [address], [place], [place] [address]  Basic metabolic panel     Status: Abnormal   Collection Time: 08/11/23  6:23 AM  Result Value Ref Range   Sodium 136 135 - 145 mmol/L   Potassium 3.9 3.5 - 5.1 mmol/L   Chloride 103 98 - 111 mmol/L   CO2 25 22 - 32 mmol/L   Glucose, Bld 103 (H) 70 - 99 mg/dL    Comment: Glucose reference range applies only to samples taken after fasting for at least 8 hours.   BUN 18 8 - 23 mg/dL   Creatinine, Ser 5.63 0.61 - 1.24 mg/dL   Calcium 8.4 (L) 8.9 - 10.3 mg/dL   GFR, Estimated >87 >56 mL/min    Comment: (NOTE) Calculated using the CKD-EPI Creatinine Equation (2021)    Anion gap 8 5 - 15    Comment: Performed at [facility], [address] [place], [place] [address]  VAS [place] ABI WITH/WO TBI     Status: None   Collection Time: 08/30/23 10:49 AM  Result Value Ref Range   Right ABI 0.59    Left ABI 0.66     Radiology VAS [place] ABI WITH/WO TBI  Result Date: 08/31/2023  LOWER EXTREMITY DOPPLER STUDY Patient Name:  Bryan Welch  Date of Exam:   08/30/2023 Medical Rec #: [id number]          Accession #:    [id number] Date of Birth: 31-Jul-1936          Patient Gender: M Patient Age:   87 years Exam Location:  [facility] Procedure:      VAS [place] ABI WITH/WO TBI Referring Phys: Barbara Cower Madaleine Simmon --------------------------------------------------------------------------------  Indications: Peripheral artery disease. High Risk Factors: Hypertension, hyperlipidemia, past history of smoking,                    coronary  artery disease.  Vascular Interventions: 08/09/2023 Thrombectomy, PTA and lysis SFA to peroneal                         08/10/2023 SFA stent and tib per PTA. Performing Technologist: Hardie Lora RVT  Examination Guidelines: A complete evaluation includes at minimum, Doppler waveform signals and systolic  MRN : [id number]  Bryan Welch is a 87 y.o. (1935/12/31) male who presents with chief complaint of  Chief Complaint  Patient presents with   Follow-up    Follow up with Annice Needy, MD (Vascular Surgery) in 2 weeks (08/25/2023); abi.  Marland Kitchen  History of Present Illness: Patient returns today in follow up of his PAD.  He is about 3 weeks status post extensive right lower extremity revascularization which required overnight thrombolytic therapy.  He is doing well with this.  His leg feels much better.  He had no access related complications.  The wound on his foot has almost completely healed at this point. His wound is almost completely healed after revascularization.  Recent ABIs were 0.59 on the right and 0.66 on the left which are actually up about 20 points from his preprocedure levels.   Current Outpatient Medications  Medication Sig Dispense Refill   acetaminophen (TYLENOL) 500 MG tablet Take 1,000 mg by mouth at bedtime.     ALPRAZolam (XANAX) 0.25 MG tablet Take 0.25 mg by mouth at bedtime.     aspirin EC 81 MG tablet Take 81 mg daily by mouth.      Cholecalciferol 25 MCG (1000 UT) capsule Take 2,000 Units by mouth daily.     diphenhydrAMINE (BENADRYL) 25 mg capsule Take 25 mg at bedtime by mouth.      gabapentin (NEURONTIN) 300 MG capsule Take 300 mg by mouth 2 (two) times daily.     hydrochlorothiazide (HYDRODIURIL) 25 MG tablet Take 1 tablet (25 mg total) by mouth daily. 30 tablet 0   hydrochlorothiazide (HYDRODIURIL) 25 MG tablet Take 1 tablet by mouth daily.     Multiple Vitamins-Minerals (PRESERVISION AREDS 2 PO) Take 1 tablet by mouth 2 (two) times daily.     omeprazole (PRILOSEC) 20 MG capsule Take 20 mg by mouth at bedtime.     potassium chloride (KLOR-CON) 10 MEQ tablet Take 10 mEq by mouth daily.     pravastatin (PRAVACHOL) 20 MG tablet Take 20 mg by mouth at bedtime.     tamsulosin (FLOMAX) 0.4 MG CAPS capsule Take 0.4 mg by mouth daily.  3   traMADol (ULTRAM) 50 MG  tablet Take 1 tablet (50 mg total) by mouth every 6 (six) hours as needed for moderate pain. 16 tablet 0   XARELTO 20 MG TABS tablet Take 20 mg by mouth daily.     No current facility-administered medications for this visit.    Past Medical History:  Diagnosis Date   Arthritis    Toes   Coronary artery disease    History of hiatal hernia    Hyperlipidemia    Hypertension    Kidney disorder    "right kidney doesn't work"   Presence of stent in artery    "4 in legs, 1 in neck"   Vascular abnormality    Wears dentures    full upper and lower    Past Surgical History:  Procedure Laterality Date   AMPUTATION TOE Right 09/16/2021   Procedure: AMPUTATION TOE;  Surgeon: Rosetta Posner, DPM;  Location: [facility];  Service: Podiatry;  Laterality: Right;   BACK SURGERY     CAROTID STENT Right    CATARACT EXTRACTION W/PHACO Left 06/17/2019   Procedure: CATARACT EXTRACTION PHACO AND INTRAOCULAR LENS PLACEMENT (IOC) LEFT;  Surgeon: Nevada Crane, MD;  Location: [facility];  Service: Ophthalmology;  Laterality: Left;   CATARACT EXTRACTION W/PHACO Right 07/08/2019   Procedure:  MRN : [id number]  Bryan Welch is a 87 y.o. (1935/12/31) male who presents with chief complaint of  Chief Complaint  Patient presents with   Follow-up    Follow up with Annice Needy, MD (Vascular Surgery) in 2 weeks (08/25/2023); abi.  Marland Kitchen  History of Present Illness: Patient returns today in follow up of his PAD.  He is about 3 weeks status post extensive right lower extremity revascularization which required overnight thrombolytic therapy.  He is doing well with this.  His leg feels much better.  He had no access related complications.  The wound on his foot has almost completely healed at this point. His wound is almost completely healed after revascularization.  Recent ABIs were 0.59 on the right and 0.66 on the left which are actually up about 20 points from his preprocedure levels.   Current Outpatient Medications  Medication Sig Dispense Refill   acetaminophen (TYLENOL) 500 MG tablet Take 1,000 mg by mouth at bedtime.     ALPRAZolam (XANAX) 0.25 MG tablet Take 0.25 mg by mouth at bedtime.     aspirin EC 81 MG tablet Take 81 mg daily by mouth.      Cholecalciferol 25 MCG (1000 UT) capsule Take 2,000 Units by mouth daily.     diphenhydrAMINE (BENADRYL) 25 mg capsule Take 25 mg at bedtime by mouth.      gabapentin (NEURONTIN) 300 MG capsule Take 300 mg by mouth 2 (two) times daily.     hydrochlorothiazide (HYDRODIURIL) 25 MG tablet Take 1 tablet (25 mg total) by mouth daily. 30 tablet 0   hydrochlorothiazide (HYDRODIURIL) 25 MG tablet Take 1 tablet by mouth daily.     Multiple Vitamins-Minerals (PRESERVISION AREDS 2 PO) Take 1 tablet by mouth 2 (two) times daily.     omeprazole (PRILOSEC) 20 MG capsule Take 20 mg by mouth at bedtime.     potassium chloride (KLOR-CON) 10 MEQ tablet Take 10 mEq by mouth daily.     pravastatin (PRAVACHOL) 20 MG tablet Take 20 mg by mouth at bedtime.     tamsulosin (FLOMAX) 0.4 MG CAPS capsule Take 0.4 mg by mouth daily.  3   traMADol (ULTRAM) 50 MG  tablet Take 1 tablet (50 mg total) by mouth every 6 (six) hours as needed for moderate pain. 16 tablet 0   XARELTO 20 MG TABS tablet Take 20 mg by mouth daily.     No current facility-administered medications for this visit.    Past Medical History:  Diagnosis Date   Arthritis    Toes   Coronary artery disease    History of hiatal hernia    Hyperlipidemia    Hypertension    Kidney disorder    "right kidney doesn't work"   Presence of stent in artery    "4 in legs, 1 in neck"   Vascular abnormality    Wears dentures    full upper and lower    Past Surgical History:  Procedure Laterality Date   AMPUTATION TOE Right 09/16/2021   Procedure: AMPUTATION TOE;  Surgeon: Rosetta Posner, DPM;  Location: [facility];  Service: Podiatry;  Laterality: Right;   BACK SURGERY     CAROTID STENT Right    CATARACT EXTRACTION W/PHACO Left 06/17/2019   Procedure: CATARACT EXTRACTION PHACO AND INTRAOCULAR LENS PLACEMENT (IOC) LEFT;  Surgeon: Nevada Crane, MD;  Location: [facility];  Service: Ophthalmology;  Laterality: Left;   CATARACT EXTRACTION W/PHACO Right 07/08/2019   Procedure:  CBC with Differential/Platelet     Status: Abnormal   Collection Time: 08/11/23  6:23 AM  Result Value Ref Range   WBC 10.8 (H) 4.0 - 10.5 K/uL   RBC 4.16 (L) 4.22 - 5.81 MIL/uL   Hemoglobin 11.7 (L) 13.0 - 17.0 g/dL   HCT 24.4 (L) 01.0 - 27.2 %   MCV 85.3 80.0 - 100.0 fL   MCH 28.1 26.0 - 34.0 pg   MCHC 33.0 30.0 - 36.0 g/dL   RDW 53.6 64.4 - 03.4 %   Platelets 130 (L) 150 - 400 K/uL   nRBC 0.0 0.0 - 0.2 %   Neutrophils Relative %  79 %   Neutro Abs 8.7 (H) 1.7 - 7.7 K/uL   Lymphocytes Relative 7 %   Lymphs Abs 0.8 0.7 - 4.0 K/uL   Monocytes Relative 10 %   Monocytes Absolute 1.0 0.1 - 1.0 K/uL   Eosinophils Relative 2 %   Eosinophils Absolute 0.2 0.0 - 0.5 K/uL   Basophils Relative 1 %   Basophils Absolute 0.1 0.0 - 0.1 K/uL   Immature Granulocytes 1 %   Abs Immature Granulocytes 0.05 0.00 - 0.07 K/uL    Comment: Performed at [facility], [address], [place], [place] [address]  Basic metabolic panel     Status: Abnormal   Collection Time: 08/11/23  6:23 AM  Result Value Ref Range   Sodium 136 135 - 145 mmol/L   Potassium 3.9 3.5 - 5.1 mmol/L   Chloride 103 98 - 111 mmol/L   CO2 25 22 - 32 mmol/L   Glucose, Bld 103 (H) 70 - 99 mg/dL    Comment: Glucose reference range applies only to samples taken after fasting for at least 8 hours.   BUN 18 8 - 23 mg/dL   Creatinine, Ser 5.63 0.61 - 1.24 mg/dL   Calcium 8.4 (L) 8.9 - 10.3 mg/dL   GFR, Estimated >87 >56 mL/min    Comment: (NOTE) Calculated using the CKD-EPI Creatinine Equation (2021)    Anion gap 8 5 - 15    Comment: Performed at [facility], [address] [place], [place] [address]  VAS [place] ABI WITH/WO TBI     Status: None   Collection Time: 08/30/23 10:49 AM  Result Value Ref Range   Right ABI 0.59    Left ABI 0.66     Radiology VAS [place] ABI WITH/WO TBI  Result Date: 08/31/2023  LOWER EXTREMITY DOPPLER STUDY Patient Name:  Bryan Welch  Date of Exam:   08/30/2023 Medical Rec #: [id number]          Accession #:    [id number] Date of Birth: 31-Jul-1936          Patient Gender: M Patient Age:   87 years Exam Location:  [facility] Procedure:      VAS [place] ABI WITH/WO TBI Referring Phys: Barbara Cower Madaleine Simmon --------------------------------------------------------------------------------  Indications: Peripheral artery disease. High Risk Factors: Hypertension, hyperlipidemia, past history of smoking,                    coronary  artery disease.  Vascular Interventions: 08/09/2023 Thrombectomy, PTA and lysis SFA to peroneal                         08/10/2023 SFA stent and tib per PTA. Performing Technologist: Hardie Lora RVT  Examination Guidelines: A complete evaluation includes at minimum, Doppler waveform signals and systolic  MRN : [id number]  Bryan Welch is a 87 y.o. (1935/12/31) male who presents with chief complaint of  Chief Complaint  Patient presents with   Follow-up    Follow up with Annice Needy, MD (Vascular Surgery) in 2 weeks (08/25/2023); abi.  Marland Kitchen  History of Present Illness: Patient returns today in follow up of his PAD.  He is about 3 weeks status post extensive right lower extremity revascularization which required overnight thrombolytic therapy.  He is doing well with this.  His leg feels much better.  He had no access related complications.  The wound on his foot has almost completely healed at this point. His wound is almost completely healed after revascularization.  Recent ABIs were 0.59 on the right and 0.66 on the left which are actually up about 20 points from his preprocedure levels.   Current Outpatient Medications  Medication Sig Dispense Refill   acetaminophen (TYLENOL) 500 MG tablet Take 1,000 mg by mouth at bedtime.     ALPRAZolam (XANAX) 0.25 MG tablet Take 0.25 mg by mouth at bedtime.     aspirin EC 81 MG tablet Take 81 mg daily by mouth.      Cholecalciferol 25 MCG (1000 UT) capsule Take 2,000 Units by mouth daily.     diphenhydrAMINE (BENADRYL) 25 mg capsule Take 25 mg at bedtime by mouth.      gabapentin (NEURONTIN) 300 MG capsule Take 300 mg by mouth 2 (two) times daily.     hydrochlorothiazide (HYDRODIURIL) 25 MG tablet Take 1 tablet (25 mg total) by mouth daily. 30 tablet 0   hydrochlorothiazide (HYDRODIURIL) 25 MG tablet Take 1 tablet by mouth daily.     Multiple Vitamins-Minerals (PRESERVISION AREDS 2 PO) Take 1 tablet by mouth 2 (two) times daily.     omeprazole (PRILOSEC) 20 MG capsule Take 20 mg by mouth at bedtime.     potassium chloride (KLOR-CON) 10 MEQ tablet Take 10 mEq by mouth daily.     pravastatin (PRAVACHOL) 20 MG tablet Take 20 mg by mouth at bedtime.     tamsulosin (FLOMAX) 0.4 MG CAPS capsule Take 0.4 mg by mouth daily.  3   traMADol (ULTRAM) 50 MG  tablet Take 1 tablet (50 mg total) by mouth every 6 (six) hours as needed for moderate pain. 16 tablet 0   XARELTO 20 MG TABS tablet Take 20 mg by mouth daily.     No current facility-administered medications for this visit.    Past Medical History:  Diagnosis Date   Arthritis    Toes   Coronary artery disease    History of hiatal hernia    Hyperlipidemia    Hypertension    Kidney disorder    "right kidney doesn't work"   Presence of stent in artery    "4 in legs, 1 in neck"   Vascular abnormality    Wears dentures    full upper and lower    Past Surgical History:  Procedure Laterality Date   AMPUTATION TOE Right 09/16/2021   Procedure: AMPUTATION TOE;  Surgeon: Rosetta Posner, DPM;  Location: [facility];  Service: Podiatry;  Laterality: Right;   BACK SURGERY     CAROTID STENT Right    CATARACT EXTRACTION W/PHACO Left 06/17/2019   Procedure: CATARACT EXTRACTION PHACO AND INTRAOCULAR LENS PLACEMENT (IOC) LEFT;  Surgeon: Nevada Crane, MD;  Location: [facility];  Service: Ophthalmology;  Laterality: Left;   CATARACT EXTRACTION W/PHACO Right 07/08/2019   Procedure:

## 2023-09-05 NOTE — Assessment & Plan Note (Signed)
His wound is almost completely healed after revascularization.  Recent ABIs were 0.59 on the right and 0.66 on the left which are actually up about 20 points from his preprocedure levels.  His leg is feeling much better and his wound is doing much better.  No access related complications.  We will recheck noninvasive studies in 2 to 3 months and he will continue current medical regimen including full anticoagulation and statin agent.

## 2023-09-05 NOTE — Assessment & Plan Note (Signed)
Recently checked and stable.  Previous right carotid endarterectomy.  No symptoms since last visit referable to cerebrovascular ischemia.

## 2023-09-05 NOTE — Assessment & Plan Note (Signed)
Hydrate and limit contrast with procedures.

## 2023-10-25 ENCOUNTER — Inpatient Hospital Stay
Admission: EM | Admit: 2023-10-25 | Discharge: 2023-10-28 | DRG: 377 | Disposition: A | Discharge status: Home / Self Care | Payer: Medicare Other | Attending: Student | Admitting: Student

## 2023-10-25 ENCOUNTER — Other Ambulatory Visit: Payer: Self-pay

## 2023-10-25 ENCOUNTER — Emergency Department: Payer: Medicare Other

## 2023-10-25 DIAGNOSIS — K922 Gastrointestinal hemorrhage, unspecified: Secondary | ICD-10-CM | POA: Diagnosis not present

## 2023-10-25 DIAGNOSIS — Z87891 Personal history of nicotine dependence: Secondary | ICD-10-CM

## 2023-10-25 DIAGNOSIS — M19071 Primary osteoarthritis, right ankle and foot: Secondary | ICD-10-CM | POA: Diagnosis present

## 2023-10-25 DIAGNOSIS — Z8719 Personal history of other diseases of the digestive system: Secondary | ICD-10-CM

## 2023-10-25 DIAGNOSIS — I5A Non-ischemic myocardial injury (non-traumatic): Secondary | ICD-10-CM | POA: Diagnosis present

## 2023-10-25 DIAGNOSIS — N183 Chronic kidney disease, stage 3 unspecified: Secondary | ICD-10-CM | POA: Diagnosis not present

## 2023-10-25 DIAGNOSIS — F419 Anxiety disorder, unspecified: Secondary | ICD-10-CM | POA: Diagnosis present

## 2023-10-25 DIAGNOSIS — I700 Atherosclerosis of aorta: Secondary | ICD-10-CM | POA: Diagnosis present

## 2023-10-25 DIAGNOSIS — Z95828 Presence of other vascular implants and grafts: Secondary | ICD-10-CM | POA: Diagnosis not present

## 2023-10-25 DIAGNOSIS — D649 Anemia, unspecified: Secondary | ICD-10-CM | POA: Diagnosis not present

## 2023-10-25 DIAGNOSIS — Z96611 Presence of right artificial shoulder joint: Secondary | ICD-10-CM | POA: Diagnosis present

## 2023-10-25 DIAGNOSIS — R531 Weakness: Principal | ICD-10-CM

## 2023-10-25 DIAGNOSIS — K921 Melena: Principal | ICD-10-CM

## 2023-10-25 DIAGNOSIS — I129 Hypertensive chronic kidney disease with stage 1 through stage 4 chronic kidney disease, or unspecified chronic kidney disease: Secondary | ICD-10-CM | POA: Diagnosis present

## 2023-10-25 DIAGNOSIS — N1832 Chronic kidney disease, stage 3b: Secondary | ICD-10-CM | POA: Diagnosis present

## 2023-10-25 DIAGNOSIS — D500 Iron deficiency anemia secondary to blood loss (chronic): Secondary | ICD-10-CM | POA: Diagnosis not present

## 2023-10-25 DIAGNOSIS — Z8616 Personal history of COVID-19: Secondary | ICD-10-CM | POA: Diagnosis not present

## 2023-10-25 DIAGNOSIS — I2510 Atherosclerotic heart disease of native coronary artery without angina pectoris: Secondary | ICD-10-CM | POA: Diagnosis present

## 2023-10-25 DIAGNOSIS — Z7901 Long term (current) use of anticoagulants: Secondary | ICD-10-CM

## 2023-10-25 DIAGNOSIS — E876 Hypokalemia: Secondary | ICD-10-CM | POA: Diagnosis present

## 2023-10-25 DIAGNOSIS — K635 Polyp of colon: Secondary | ICD-10-CM | POA: Diagnosis present

## 2023-10-25 DIAGNOSIS — Z79899 Other long term (current) drug therapy: Secondary | ICD-10-CM

## 2023-10-25 DIAGNOSIS — N1339 Other hydronephrosis: Secondary | ICD-10-CM | POA: Diagnosis present

## 2023-10-25 DIAGNOSIS — I7143 Infrarenal abdominal aortic aneurysm, without rupture: Secondary | ICD-10-CM | POA: Diagnosis present

## 2023-10-25 DIAGNOSIS — I21A1 Myocardial infarction type 2: Secondary | ICD-10-CM | POA: Diagnosis present

## 2023-10-25 DIAGNOSIS — I739 Peripheral vascular disease, unspecified: Secondary | ICD-10-CM | POA: Diagnosis present

## 2023-10-25 DIAGNOSIS — D6832 Hemorrhagic disorder due to extrinsic circulating anticoagulants: Secondary | ICD-10-CM | POA: Diagnosis present

## 2023-10-25 DIAGNOSIS — Z885 Allergy status to narcotic agent status: Secondary | ICD-10-CM

## 2023-10-25 DIAGNOSIS — Z89421 Acquired absence of other right toe(s): Secondary | ICD-10-CM

## 2023-10-25 DIAGNOSIS — Z961 Presence of intraocular lens: Secondary | ICD-10-CM | POA: Diagnosis present

## 2023-10-25 DIAGNOSIS — E785 Hyperlipidemia, unspecified: Secondary | ICD-10-CM | POA: Diagnosis present

## 2023-10-25 DIAGNOSIS — I10 Essential (primary) hypertension: Secondary | ICD-10-CM | POA: Diagnosis present

## 2023-10-25 DIAGNOSIS — I214 Non-ST elevation (NSTEMI) myocardial infarction: Secondary | ICD-10-CM

## 2023-10-25 DIAGNOSIS — I701 Atherosclerosis of renal artery: Secondary | ICD-10-CM | POA: Diagnosis present

## 2023-10-25 DIAGNOSIS — T45515A Adverse effect of anticoagulants, initial encounter: Secondary | ICD-10-CM | POA: Diagnosis present

## 2023-10-25 DIAGNOSIS — M19072 Primary osteoarthritis, left ankle and foot: Secondary | ICD-10-CM | POA: Diagnosis present

## 2023-10-25 DIAGNOSIS — I959 Hypotension, unspecified: Secondary | ICD-10-CM | POA: Diagnosis present

## 2023-10-25 DIAGNOSIS — Z7982 Long term (current) use of aspirin: Secondary | ICD-10-CM

## 2023-10-25 DIAGNOSIS — D62 Acute posthemorrhagic anemia: Secondary | ICD-10-CM | POA: Diagnosis present

## 2023-10-25 HISTORY — DX: Myocardial infarction type 2: I21.A1

## 2023-10-25 HISTORY — DX: Non-ST elevation (NSTEMI) myocardial infarction: I21.4

## 2023-10-25 LAB — CBC
HCT: 18.1 % — ABNORMAL LOW (ref 39.0–52.0)
Hemoglobin: 5.4 g/dL — ABNORMAL LOW (ref 13.0–17.0)
MCH: 27.4 pg (ref 26.0–34.0)
MCHC: 29.8 g/dL — ABNORMAL LOW (ref 30.0–36.0)
MCV: 91.9 fL (ref 80.0–100.0)
Platelets: 237 10*3/uL (ref 150–400)
RBC: 1.97 MIL/uL — ABNORMAL LOW (ref 4.22–5.81)
RDW: 17.7 % — ABNORMAL HIGH (ref 11.5–15.5)
WBC: 10.8 10*3/uL — ABNORMAL HIGH (ref 4.0–10.5)
nRBC: 0 % (ref 0.0–0.2)

## 2023-10-25 LAB — BASIC METABOLIC PANEL
Anion gap: 14 (ref 5–15)
BUN: 62 mg/dL — ABNORMAL HIGH (ref 8–23)
CO2: 19 mmol/L — ABNORMAL LOW (ref 22–32)
Calcium: 8.5 mg/dL — ABNORMAL LOW (ref 8.9–10.3)
Chloride: 106 mmol/L (ref 98–111)
Creatinine, Ser: 1.63 mg/dL — ABNORMAL HIGH (ref 0.61–1.24)
GFR, Estimated: 41 mL/min — ABNORMAL LOW (ref >60)
Glucose, Bld: 151 mg/dL — ABNORMAL HIGH (ref 70–99)
Potassium: 3.4 mmol/L — ABNORMAL LOW (ref 3.5–5.1)
Sodium: 139 mmol/L (ref 135–145)

## 2023-10-25 LAB — IRON AND TIBC
Iron: 59 ug/dL (ref 45–182)
Saturation Ratios: 18 % (ref 17.9–39.5)
TIBC: 322 ug/dL (ref 250–450)
UIBC: 263 ug/dL

## 2023-10-25 LAB — HEPATIC FUNCTION PANEL
ALT: 12 U/L (ref 0–44)
AST: 21 U/L (ref 15–41)
Albumin: 3.2 g/dL — ABNORMAL LOW (ref 3.5–5.0)
Alkaline Phosphatase: 47 U/L (ref 38–126)
Bilirubin, Direct: <0.1 mg/dL (ref 0.0–0.2)
Total Bilirubin: 0.5 mg/dL (ref <1.2)
Total Protein: 5.9 g/dL — ABNORMAL LOW (ref 6.5–8.1)

## 2023-10-25 LAB — URINALYSIS, ROUTINE W REFLEX MICROSCOPIC
Bilirubin Urine: NEGATIVE
Glucose, UA: NEGATIVE mg/dL
Hgb urine dipstick: NEGATIVE
Ketones, ur: NEGATIVE mg/dL
Leukocytes,Ua: NEGATIVE
Nitrite: NEGATIVE
Protein, ur: NEGATIVE mg/dL
Specific Gravity, Urine: 1.028 (ref 1.005–1.030)
pH: 5 (ref 5.0–8.0)

## 2023-10-25 LAB — FERRITIN: Ferritin: 14 ng/mL — ABNORMAL LOW (ref 24–336)

## 2023-10-25 LAB — PROTIME-INR
INR: 2 — ABNORMAL HIGH (ref 0.8–1.2)
Prothrombin Time: 23.2 s — ABNORMAL HIGH (ref 11.4–15.2)

## 2023-10-25 LAB — APTT: aPTT: 32 s (ref 24–36)

## 2023-10-25 LAB — FOLATE: Folate: 17.4 ng/mL (ref >5.9)

## 2023-10-25 LAB — TROPONIN I (HIGH SENSITIVITY)
Troponin I (High Sensitivity): 194 ng/L (ref <18)
Troponin I (High Sensitivity): 688 ng/L (ref <18)

## 2023-10-25 LAB — HEMOGLOBIN AND HEMATOCRIT, BLOOD
HCT: 24.9 % — ABNORMAL LOW (ref 39.0–52.0)
HCT: 25.1 % — ABNORMAL LOW (ref 39.0–52.0)
Hemoglobin: 8.1 g/dL — ABNORMAL LOW (ref 13.0–17.0)
Hemoglobin: 8.4 g/dL — ABNORMAL LOW (ref 13.0–17.0)

## 2023-10-25 LAB — PREPARE RBC (CROSSMATCH)

## 2023-10-25 MED ORDER — ONDANSETRON HCL 4 MG PO TABS: 4.0000 mg | ORAL_TABLET | Freq: Four times a day (QID) | ORAL | Status: DC | PRN

## 2023-10-25 MED ORDER — PANTOPRAZOLE SODIUM 40 MG PO TBEC
40.0000 mg | DELAYED_RELEASE_TABLET | Freq: Two times a day (BID) | ORAL | Status: DC
  Administered 2023-10-25 – 2023-10-28 (×6): 40 mg via ORAL
  Filled 2023-10-25 (×6): qty 1

## 2023-10-25 MED ORDER — IOHEXOL 350 MG/ML SOLN
80.0000 mL | Freq: Once | INTRAVENOUS | Status: AC | PRN
  Administered 2023-10-25: 80 mL via INTRAVENOUS

## 2023-10-25 MED ORDER — PANTOPRAZOLE SODIUM 40 MG IV SOLR
80.0000 mg | Freq: Once | INTRAVENOUS | Status: AC
  Administered 2023-10-25: 80 mg via INTRAVENOUS
  Filled 2023-10-25: qty 20

## 2023-10-25 MED ORDER — SODIUM CHLORIDE 0.9% IV SOLUTION
Freq: Once | INTRAVENOUS | Status: AC
  Filled 2023-10-25: qty 250

## 2023-10-25 MED ORDER — ONDANSETRON HCL 4 MG/2ML IJ SOLN: 4.0000 mg | Freq: Four times a day (QID) | INTRAMUSCULAR | Status: DC | PRN

## 2023-10-25 MED ORDER — SODIUM CHLORIDE 0.9 % IV SOLN: INTRAVENOUS | Status: AC

## 2023-10-25 MED ORDER — SODIUM CHLORIDE 0.9% FLUSH
10.0000 mL | Freq: Two times a day (BID) | INTRAVENOUS | Status: DC
  Administered 2023-10-25 – 2023-10-26 (×4): 10 mL via INTRAVENOUS

## 2023-10-25 MED ORDER — SODIUM CHLORIDE 0.9% FLUSH
10.0000 mL | Freq: Two times a day (BID) | INTRAVENOUS | Status: DC
  Administered 2023-10-25 – 2023-10-28 (×4): 10 mL via INTRAVENOUS

## 2023-10-25 NOTE — Consult Note (Signed)
Arlyss Repress, MD [address]  [address]  [place], [place] [address]  Main: [phone]  Fax: [phone] Pager: [phone]   Consultation  Referring Provider:     No ref. provider found Primary Care Physician:  Gracelyn Nurse, MD Primary Gastroenterologist:    [facility]       Reason for Consultation: Acute on chronic anemia, melena  Date of Admission:  10/25/2023 Date of Consultation:  10/25/2023         HPI:   Bryan Welch is a 87 y.o. male history of hypertension, hyperlipidemia, peripheral artery disease, status post revascularization, stage III CKD, on Xarelto, chronic iron deficiency anemia with known history of iron deficiency anemia, small bowel AVMs.  He was evaluated by GI service in May 2024 when he was admitted with severe iron deficiency anemia hemoglobin 6.1, underwent EGD and colonoscopy, found to have large IC valve polyp as well as few other small polyps which were resected.  The IC valve polyp was biopsied, pathology showed tubulovillous adenoma only.  Biopsies from EGD were unremarkable.  Patient was readmitted on 05/08/2023 with recurrent anemia hemoglobin 5.9.  At that time he underwent small bowel endoscopy which revealed AVMs in the duodenum, treated with APC.  Since then his hemoglobin was back to normal as of 06/2023 it was around 13. In October, he underwent extensive right lower extremity revascularization which required open night thrombolytic therapy which allowed healing of wound in his foot.  At that time, his hemoglobin was 11.7. Today, he presented to ER with severe symptomatic anemia, increasing weakness, fatigue, pallor as well as large episode of black tarry stool yesterday after several days of constipation.  He was mildly hypotensive in the ER, responded to IV fluids.  He was found to have low hemoglobin of 5.6, normal MCV, elevated BUN/creatinine 62/1.63, was normal in early October.  He underwent CT angio GI bleed protocol which was  unremarkable. GI is consulted for further evaluation due to recurrence of GI bleed  NSAIDs: None  Antiplts/Anticoagulants/Anti thrombotics: Xarelto, last dose yesterday  GI Procedures:  Upper endoscopy and colonoscopy 04/13/2023 - Normal duodenal bulb, first portion of the duodenum and second portion of the duodenum. Biopsied. - Gastritis. Biopsied. - Esophagogastric landmarks identified. - Z- line irregular. - Esophageal mucosal changes classified as Barrett' s stage C2- M2 per Prague criteria. Biopsied.  - The examined portion of the ileum was normal. - Rule out malignancy, polypoid lesion at the ileocecal valve. Biopsied. - One 1 to 2 mm polyp in the cecum, removed with a jumbo cold forceps. Resected and retrieved. - Perianal skin tags found on perianal exam. - One 11 to 12 mm polyp in the ascending colon, removed with a cold snare. Resected and retrieved. Clips ( MR conditional) were placed. - One 2 to 3 mm polyp in the descending colon, removed with a cold snare. Resected and retrieved. - Granular mucosa in the rectum. Biopsied. - The examination was otherwise normal on direct and retroflexion views. - Diverticulosis in the left colon.  Push enteroscopy 05/10/2023 - Normal esophagus. - Gastritis. - A few recently bleeding angiodysplastic lesions in the duodenum. Treated with argon plasma coagulation ( APC) . - A few recently bleeding angiodysplastic lesions in the duodenum. Treated with argon plasma coagulation ( APC) . - No specimens collected.   Past Medical History:  Diagnosis Date   Arthritis    Toes   Coronary artery disease    History of hiatal hernia  Hyperlipidemia    Hypertension    Kidney disorder    "right kidney doesn't work"   Presence of stent in artery    "4 in legs, 1 in neck"   Vascular abnormality    Wears dentures    full upper and lower    Past Surgical History:  Procedure Laterality Date   AMPUTATION TOE Right 09/16/2021   Procedure: AMPUTATION TOE;   Surgeon: Rosetta Posner, DPM;  Location: [facility];  Service: Podiatry;  Laterality: Right;   BACK SURGERY     CAROTID STENT Right    CATARACT EXTRACTION W/PHACO Left 06/17/2019   Procedure: CATARACT EXTRACTION PHACO AND INTRAOCULAR LENS PLACEMENT (IOC) LEFT;  Surgeon: Nevada Crane, MD;  Location: [facility];  Service: Ophthalmology;  Laterality: Left;   CATARACT EXTRACTION W/PHACO Right 07/08/2019   Procedure: CATARACT EXTRACTION PHACO AND INTRAOCULAR LENS PLACEMENT (IOC) right  00:39.2  10.8%  5.02  ;  Surgeon: Nevada Crane, MD;  Location: [facility];  Service: Ophthalmology;  Laterality: Right;   COLONOSCOPY WITH PROPOFOL N/A 04/13/2023   Procedure: COLONOSCOPY WITH PROPOFOL;  Surgeon: Jaynie Collins, DO;  Location: [facility] ENDOSCOPY;  Service: Gastroenterology;  Laterality: N/A;   ENTEROSCOPY N/A 05/10/2023   Procedure: ENTEROSCOPY;  Surgeon: Midge Minium, MD;  Location: [facility] ENDOSCOPY;  Service: Endoscopy;  Laterality: N/A;   ESOPHAGOGASTRODUODENOSCOPY (EGD) WITH PROPOFOL N/A 04/13/2023   Procedure: ESOPHAGOGASTRODUODENOSCOPY (EGD) WITH PROPOFOL;  Surgeon: Jaynie Collins, DO;  Location: [facility];  Service: Gastroenterology;  Laterality: N/A;   EYE SURGERY     GIVENS CAPSULE STUDY N/A 05/09/2023   Procedure: GIVENS CAPSULE STUDY;  Surgeon: Midge Minium, MD;  Location: [facility] ENDOSCOPY;  Service: Endoscopy;  Laterality: N/A;   HOT HEMOSTASIS  05/10/2023   Procedure: HOT HEMOSTASIS (ARGON PLASMA COAGULATION/BICAP);  Surgeon: Midge Minium, MD;  Location: [facility] ENDOSCOPY;  Service: Endoscopy;;   LEG SURGERY Bilateral    stent placement   LOWER EXTREMITY ANGIOGRAPHY Right 08/19/2021   Procedure: LOWER EXTREMITY ANGIOGRAPHY;  Surgeon: Annice Needy, MD;  Location: [facility] INVASIVE CV LAB;  Service: Cardiovascular;  Laterality: Right;   LOWER EXTREMITY ANGIOGRAPHY Right 08/09/2023   Procedure: Lower Extremity Angiography;  Surgeon: Annice Needy, MD;   Location: [facility] INVASIVE CV LAB;  Service: Cardiovascular;  Laterality: Right;   LOWER EXTREMITY ANGIOGRAPHY Right 08/10/2023   Procedure: Lower Extremity Angiography;  Surgeon: Annice Needy, MD;  Location: [facility] INVASIVE CV LAB;  Service: Cardiovascular;  Laterality: Right;   PERIPHERAL VASCULAR BALLOON ANGIOPLASTY Right 09/21/2017   Procedure: PERIPHERAL VASCULAR BALLOON ANGIOPLASTY;  Surgeon: Annice Needy, MD;  Location: [facility] INVASIVE CV LAB;  Service: Cardiovascular;  Laterality: Right;   REVERSE SHOULDER ARTHROPLASTY Right 11/26/2019   Procedure: REVERSE SHOULDER ARTHROPLASTY;  Surgeon: Christena Flake, MD;  Location: [facility] ORS;  Service: Orthopedics;  Laterality: Right;     Current Facility-Administered Medications:    0.9 %  sodium chloride infusion, , Intravenous, Continuous, Floydene Flock, MD, Last Rate: 75 mL/hr at 10/25/23 1114, New Bag at 10/25/23 1114   ondansetron (ZOFRAN) tablet 4 mg, 4 mg, Oral, Q6H PRN **OR** ondansetron (ZOFRAN) injection 4 mg, 4 mg, Intravenous, Q6H PRN, Floydene Flock, MD   pantoprazole (PROTONIX) EC tablet 40 mg, 40 mg, Oral, BID AC, Margrett Kalb, Loel Dubonnet, MD   sodium chloride flush (NS) 0.9 % injection 10 mL, 10 mL, Intravenous, Q12H, Mumma, Shannon, MD, 10 mL at 10/25/23 0934   sodium chloride flush (NS) 0.9 % injection  10 mL, 10 mL, Intravenous, Q12H, Floydene Flock, MD  Current Outpatient Medications:    acetaminophen (TYLENOL) 500 MG tablet, Take 1,000 mg by mouth at bedtime., Disp: , Rfl:    ALPRAZolam (XANAX) 0.25 MG tablet, Take 0.25 mg by mouth at bedtime., Disp: , Rfl:    aspirin EC 81 MG tablet, Take 81 mg daily by mouth. , Disp: , Rfl:    Cholecalciferol 25 MCG (1000 UT) capsule, Take 2,000 Units by mouth daily., Disp: , Rfl:    diphenhydrAMINE (BENADRYL) 25 mg capsule, Take 25 mg at bedtime by mouth. , Disp: , Rfl:    gabapentin (NEURONTIN) 300 MG capsule, Take 300 mg by mouth 2 (two) times daily., Disp: , Rfl:    hydrochlorothiazide  (HYDRODIURIL) 25 MG tablet, Take 1 tablet (25 mg total) by mouth daily., Disp: 30 tablet, Rfl: 0   Multiple Vitamins-Minerals (PRESERVISION AREDS 2 PO), Take 1 tablet by mouth 2 (two) times daily., Disp: , Rfl:    omeprazole (PRILOSEC) 20 MG capsule, Take 20 mg by mouth at bedtime., Disp: , Rfl:    potassium chloride (KLOR-CON) 10 MEQ tablet, Take 10 mEq by mouth daily., Disp: , Rfl:    pravastatin (PRAVACHOL) 20 MG tablet, Take 20 mg by mouth at bedtime., Disp: , Rfl:    tamsulosin (FLOMAX) 0.4 MG CAPS capsule, Take 0.4 mg by mouth daily., Disp: , Rfl: 3   XARELTO 20 MG TABS tablet, Take 20 mg by mouth daily., Disp: , Rfl:    hydrochlorothiazide (HYDRODIURIL) 25 MG tablet, Take 1 tablet by mouth daily., Disp: , Rfl:    traMADol (ULTRAM) 50 MG tablet, Take 1 tablet (50 mg total) by mouth every 6 (six) hours as needed for moderate pain. (Patient not taking: Reported on 10/25/2023), Disp: 16 tablet, Rfl: 0   Family History  Problem Relation Age of Onset   Bladder Cancer Mother    Prostate cancer Father    Prostate cancer Brother    Diabetes Maternal Grandfather      Social History   Tobacco Use   Smoking status: Former    Current packs/day: 0.00    Types: Cigarettes    Quit date: 2001    Years since quitting: 23.9   Smokeless tobacco: Never  Vaping Use   Vaping status: Never Used  Substance Use Topics   Alcohol use: Not Currently   Drug use: No    Allergies as of 10/25/2023 - Review Complete 10/25/2023  Allergen Reaction Noted   Codeine Itching 03/18/2014    Review of Systems:    All systems reviewed and negative except where noted in HPI.   Physical Exam:  Vital signs in last 24 hours: Temp:  [97.7 F (36.5 C)-98.1 F (36.7 C)] 98.1 F (36.7 C) (12/18 1822) Pulse Rate:  [77-96] 82 (12/18 1822) Resp:  [17-21] 17 (12/18 1822) BP: (96-117)/(32-72) 105/51 (12/18 1822) SpO2:  [100 %] 100 % (12/18 1632) Weight:  [59 kg] 59 kg (12/18 0731) Last BM Date :  10/24/23 General:   Pleasant, cooperative in NAD Head:  Normocephalic and atraumatic. Eyes:   No icterus.   Conjunctiva pale. PERRLA. Ears:  Normal auditory acuity. Neck:  Supple; no masses or thyroidomegaly Lungs: Respirations even and unlabored. Lungs clear to auscultation bilaterally.   No wheezes, crackles, or rhonchi.  Heart:  Regular rate and rhythm;  Without murmur, clicks, rubs or gallops Abdomen:  Soft, nondistended, nontender. Normal bowel sounds. No appreciable masses or hepatomegaly.  No rebound or guarding.  Rectal:  Not performed. Msk:  Symmetrical without gross deformities.  Strength generalized weakness Extremities:  Without edema, cyanosis or clubbing. Neurologic:  Alert and oriented x3;  grossly normal neurologically. Skin:  Intact without significant lesions or rashes. Psych:  Alert and cooperative. Normal affect.  LAB RESULTS:    Latest Ref Rng & Units 10/25/2023    7:34 AM 08/11/2023    6:23 AM 08/10/2023    4:42 AM  CBC  WBC 4.0 - 10.5 K/uL 10.8  10.8  9.0   Hemoglobin 13.0 - 17.0 g/dL 5.4  06.3  01.6   Hematocrit 39.0 - 52.0 % 18.1  35.5  35.7   Platelets 150 - 400 K/uL 237  130  156     BMET    Latest Ref Rng & Units 10/25/2023    7:34 AM 08/11/2023    6:23 AM 08/09/2023    7:33 AM  BMP  Glucose 70 - 99 mg/dL 010  932    BUN 8 - 23 mg/dL 62  18  29   Creatinine 0.61 - 1.24 mg/dL 3.55  7.32  2.02   Sodium 135 - 145 mmol/L 139  136    Potassium 3.5 - 5.1 mmol/L 3.4  3.9    Chloride 98 - 111 mmol/L 106  103    CO2 22 - 32 mmol/L 19  25    Calcium 8.9 - 10.3 mg/dL 8.5  8.4      LFT    Latest Ref Rng & Units 10/25/2023    7:34 AM 07/04/2023   12:12 PM 06/08/2023   11:16 PM  Hepatic Function  Total Protein 6.5 - 8.1 g/dL 5.9  7.4  7.6   Albumin 3.5 - 5.0 g/dL 3.2  4.0  4.0   AST 15 - 41 U/L 21  21  30    ALT 0 - 44 U/L 12  17  14    Alk Phosphatase 38 - 126 U/L 47  75  87   Total Bilirubin <1.2 mg/dL 0.5  0.6  0.4   Bilirubin, Direct 0.0 - 0.2 mg/dL  <5.4        STUDIES: CT ANGIO GI BLEED Result Date: 10/25/2023 CLINICAL DATA:  Weakness, fatigue. EXAM: CTA ABDOMEN AND PELVIS WITHOUT AND WITH CONTRAST TECHNIQUE: Multidetector CT imaging of the abdomen and pelvis was performed using the standard protocol during bolus administration of intravenous contrast. Multiplanar reconstructed images and MIPs were obtained and reviewed to evaluate the vascular anatomy. RADIATION DOSE REDUCTION: This exam was performed according to the departmental dose-optimization program which includes automated exposure control, adjustment of the mA and/or kV according to patient size and/or use of iterative reconstruction technique. CONTRAST:  80mL OMNIPAQUE IOHEXOL 350 MG/ML SOLN COMPARISON:  May 08, 2023. FINDINGS: VASCULAR Aorta: 4.3 cm saccular infrarenal abdominal aortic aneurysm is noted. No dissection is noted. Celiac: Patent without evidence of aneurysm, dissection, vasculitis or significant stenosis. SMA: Severe stenosis is noted at origin secondary to calcified plaque. Renals: Severe stenosis is noted at origin of right renal artery secondary to calcified plaque. Mild stenosis is noted at proximal portion of left renal artery secondary to calcified plaque. IMA: Occluded at origin. Inflow: Grossly patent with patent stent in the proximal right external iliac artery. Proximal Outflow: Bilateral common femoral and visualized portions of the superficial and profunda femoral arteries are patent without evidence of aneurysm, dissection, vasculitis or significant stenosis. Veins: No obvious venous abnormality within the limitations of this arterial phase study. Review of the MIP images  confirms the above findings. NON-VASCULAR Lower chest: No acute abnormality. Hepatobiliary: Minimal cholelithiasis. No biliary dilatation. Liver is unremarkable. Pancreas: Unremarkable. No pancreatic ductal dilatation or surrounding inflammatory changes. Spleen: Normal in size without focal  abnormality. Adrenals/Urinary Tract: Adrenal glands appear normal. Right renal atrophy is noted. Chronic right hydronephrosis. No ureteral dilatation is noted. Urinary bladder is unremarkable. Stomach/Bowel: Stomach is within normal limits. Appendix appears normal. No evidence of bowel wall thickening, distention, or inflammatory changes. Sigmoid diverticulosis without inflammation. No definite evidence of contrast extravasation to suggest active gastrointestinal bleeding. Lymphatic: No adenopathy. Reproductive: Mild prostatic enlargement. Other: No ascites or hernia. Musculoskeletal: Multilevel degenerative changes are noted in lumbar spine. No acute osseous abnormality seen. IMPRESSION: VASCULAR No definite evidence of active gastrointestinal bleeding. 4.3 cm infrarenal abdominal aortic aneurysm. Recommend follow-up CT or MR as appropriate in 12 months and referral to or continued care with vascular specialist. (Ref.: J Vasc Surg. 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 Mild stenosis is noted at origin of left renal artery. 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 Aortic Atherosclerosis (ICD10-I70.0). NON-VASCULAR Minimal cholelithiasis. Sigmoid diverticulosis without bleeding. Mild prostatic enlargement. Chronic right hydronephrosis. Electronically Signed   By: Lupita Raider M.D.   On: 10/25/2023 10:12      Impression / Plan:   Bryan Welch is a 87 y.o. male with extensive peripheral artery disease status post revascularization on Xarelto with new onset of iron deficiency anemia since May 2024 secondary to recurrent bleeding from small bowel AVMs.  He underwent revascularization procedure in October 2024, now presents with acute on chronic severe symptomatic anemia in setting of dual anticoagulation, aspirin as well as Xarelto  Melena with acute on chronic severe symptomatic anemia,  elevated BUN/creatinine concern for upper GI bleed, peptic ulcer disease or recurrent bleeding from small bowel AVMs Currently being resuscitated with blood transfusion CT angio GI bleed protocol did not reveal active GI bleeding source Recommend Protonix 40 mg p.o./IV twice daily Okay with soft diet Monitor CBC closely to maintain hemoglobin above 8 Maintain 2 large-bore IVs Agree with holding Xarelto and discuss about reinitiation of long-term anticoagulation due to concern for recurrent GI bleed which patient has clearly demonstrated within last 12 months Recommend EGD/push enteroscopy after holding Xarelto at least for 48 hours or sooner if patient demonstrates active GI bleed despite adequate resuscitation  I have discussed alternative options, risks & benefits,  which include, but are not limited to, bleeding, infection, perforation,respiratory complication & drug reaction.  The patient agrees with this plan & written consent will be obtained.     Thank you for involving me in the care of this patient.      LOS: 0 days   Lannette Donath, MD  10/25/2023, 6:56 PM    Note: This dictation was prepared with Dragon dictation along with smaller phrase technology. Any transcriptional errors that result from this process are unintentional.

## 2023-10-25 NOTE — Assessment & Plan Note (Signed)
Creatinine 1.6 with GFR in the 40s At baseline Monitor

## 2023-10-25 NOTE — Assessment & Plan Note (Addendum)
Progressive weakness and fatigue over 3 to 4 days with noted dark/maroon-colored stools Admission July 2024 for similar issues with small intestine GI bleeding on VCE (transitioned from eliquis to ASA and statin) Noted baseline extensive peripheral vascular disease status post vascular intervention and CAD on Xarelto s/p revascularization procedure with vascular surgery 08/2023 Baseline hemoglobin around 12 Hemoglobin 5 today Hold Xarelto CT GI angio pending PRBC transfusion started in the ER Trend hemoglobin Transfuse if hemoglobin is 7 Dr. Allegra Lai with gastroenterology consulted with plan for endoscopic evaluation Follow-up GI recommendations

## 2023-10-25 NOTE — Assessment & Plan Note (Addendum)
LLN BP on presentation  Hold BP regimen for now

## 2023-10-25 NOTE — ED Notes (Signed)
Pt up to bathroom at this time. Gait steady.

## 2023-10-25 NOTE — Assessment & Plan Note (Signed)
Trop 190s w/ transient CP in setting of symptomatic anemia w/ hgb 5 EKG sinus tach  Suspect secondary demand ischemia in setting of anemia Will trend trop w/ pRC transfusion  Monitor  Cardiology evaluation as clinically appropriate

## 2023-10-25 NOTE — ED Notes (Signed)
Pt placed on cardiac monitor and CCMD notified.

## 2023-10-25 NOTE — ED Triage Notes (Signed)
EMS reports they were called out for increased weakness and fatigue; patient pale in color with a history of anemia and upper GI bleed.

## 2023-10-25 NOTE — ED Provider Notes (Signed)
[facility] Provider Note    Event Date/Time   First MD Initiated Contact with Patient 10/25/23 [phone]     (approximate)   History   Weakness   HPI  Bryan Welch is a 87 y.o. male past medical history sniffing and for peripheral arterial disease with recent right lower extremity revascularization and thrombectomy, infrarenal AAA, clotting disorder on Xarelto, history of GI bleed, who presents to the emergency department with generalized weakness and fatigue.  States that he has been having generalized weakness over the past 4 to 5 days significantly worsened today.  Does endorse a large bowel movement yesterday that was black and tarry after multiple days of constipation.  Denies any abdominal pain at this time.  Does endorse a history of GI bleeds and has received blood transfusions in the past.  History of AVM and has a known polyp in his colon and has a referral to [facility].  Denies any history of cirrhosis or alcohol use disorder.  No daily NSAIDs.  Does endorse a history of an ulcer to his aorta.  Denies any burning with urination.  Denies fever or chills.  Episode of chest pain just prior to arrival to the emergency department that has since resolved.  Denies any shortness of breath.     Physical Exam   Triage Vital Signs: ED Triage Vitals  Encounter Vitals Group     BP 10/25/23 0729 (!) 98/32     Systolic BP Percentile --      Diastolic BP Percentile --      Pulse Rate 10/25/23 0729 96     Resp 10/25/23 0729 18     Temp 10/25/23 0729 98 F (36.7 C)     Temp Source 10/25/23 0729 Oral     SpO2 10/25/23 0734 100 %     Weight 10/25/23 0731 130 lb (59 kg)     Height 10/25/23 0731 5\' 6"  (1.676 m)     Head Circumference --      Peak Flow --      Pain Score 10/25/23 0731 0     Pain Loc --      Pain Education --      Exclude from Growth Chart --     Most recent vital signs: Vitals:   10/25/23 0729 10/25/23 0734  BP: (!) 98/32   Pulse: 96    Resp: 18   Temp: 98 F (36.7 C)   SpO2:  100%    Physical Exam Exam conducted with a chaperone present.  Constitutional:      Appearance: He is well-developed. He is ill-appearing.  HENT:     Head: Atraumatic.  Eyes:     Comments: Pale conjunctiva  Cardiovascular:     Rate and Rhythm: Regular rhythm.  Pulmonary:     Effort: No respiratory distress.  Abdominal:     Tenderness: There is no abdominal tenderness.  Genitourinary:    Comments: DRE with melanotic stool Musculoskeletal:     Cervical back: Normal range of motion.  Skin:    General: Skin is warm.     Capillary Refill: Capillary refill takes 2 to 3 seconds.  Neurological:     Mental Status: He is alert. Mental status is at baseline.     IMPRESSION / MDM / ASSESSMENT AND PLAN / ED COURSE  I reviewed the triage vital signs and the nursing notes.  On chart review patient has had an endoscopy and colonoscopy in the past.  History  of AVM.  Also has a known polyp and had a referral to [facility].  Recent extensive peripheral arterial disease that required thrombectomy and followed by Dr. Wyn Quaker  Differential diagnosis including GI bleed, infectious process, ACS, anemia, electrolyte abnormality   EKG  I, Corena Herter, the attending physician, personally viewed and interpreted this ECG.  EKG showed normal sinus rhythm.  Mild ST elevation to aVR with diffuse ST depression.  QTc 462.  Narrow complex.  Repeat EKG obtained after episode of chest pain with no significant change when compared to initial EKG.  Does not meet STEMI criteria  RADIOLOGY I independently reviewed imaging, my interpretation of imaging: CTA GI without obvious signs of active extravasation.  No obvious bleeding from aorta.  LABS (all labs ordered are listed, but only abnormal results are displayed) Labs interpreted as -    Labs Reviewed  BASIC METABOLIC PANEL - Abnormal; Notable for the following components:      Result Value   Potassium 3.4 (*)     CO2 19 (*)    Glucose, Bld 151 (*)    BUN 62 (*)    Creatinine, Ser 1.63 (*)    Calcium 8.5 (*)    GFR, Estimated 41 (*)    All other components within normal limits  CBC - Abnormal; Notable for the following components:   WBC 10.8 (*)    RBC 1.97 (*)    Hemoglobin 5.4 (*)    HCT 18.1 (*)    MCHC 29.8 (*)    RDW 17.7 (*)    All other components within normal limits  HEPATIC FUNCTION PANEL - Abnormal; Notable for the following components:   Total Protein 5.9 (*)    Albumin 3.2 (*)    All other components within normal limits  PROTIME-INR - Abnormal; Notable for the following components:   Prothrombin Time 23.2 (*)    INR 2.0 (*)    All other components within normal limits  TROPONIN I (HIGH SENSITIVITY) - Abnormal; Notable for the following components:   Troponin I (High Sensitivity) 194 (*)    All other components within normal limits  APTT  URINALYSIS, ROUTINE W REFLEX MICROSCOPIC  CBG MONITORING, ED  TYPE AND SCREEN  PREPARE RBC (CROSSMATCH)  TROPONIN I (HIGH SENSITIVITY)     MDM    2 IVs placed.  Hemoglobin resulted at 5.4.  Concern for significant GI bleed.  Patient was typed and crossed and ordered 1 unit PRBC.  Given IV Protonix given concern for upper GI bleed, significantly elevated BUN and does have a mild acute kidney injury.  No significant electrolyte abnormality.  Troponin significantly elevated at 194, likely demand ischemia in the setting of severe acute blood loss anemia.  Discussed with gastroenterology Dr. Allegra Lai who recommended IV Protonix twice daily and blood transfusion, will hold on endoscopy at this time.  Recommended admission to the hospitalist.  Recommended holding Xarelto but not reversing Xarelto at this time if blood pressure remained stable.  Consulted hospitalist for admission.  PROCEDURES:  Critical Care performed: yes  .Critical Care  Performed by: Corena Herter, MD Authorized by: Corena Herter, MD   Critical care provider  statement:    Critical care time (minutes):  50   Critical care time was exclusive of:  Separately billable procedures and treating other patients   Critical care was necessary to treat or prevent imminent or life-threatening deterioration of the following conditions:  Circulatory failure   Critical care was time spent personally by me on  the following activities:  Development of treatment plan with patient or surrogate, discussions with consultants, evaluation of patient's response to treatment, examination of patient, ordering and review of laboratory studies, ordering and review of radiographic studies, ordering and performing treatments and interventions, pulse oximetry, re-evaluation of patient's condition and review of old charts   Care discussed with: admitting provider     Patient's presentation is most consistent with acute presentation with potential threat to life or bodily function.   MEDICATIONS ORDERED IN ED: Medications  sodium chloride flush (NS) 0.9 % injection 10 mL (has no administration in time range)  pantoprazole (PROTONIX) injection 80 mg (80 mg Intravenous Given 10/25/23 0830)  iohexol (OMNIPAQUE) 350 MG/ML injection 80 mL (80 mLs Intravenous Contrast Given 10/25/23 0841)    FINAL CLINICAL IMPRESSION(S) / ED DIAGNOSES   Final diagnoses:  Weakness  Gastrointestinal hemorrhage with melena  Acute blood loss anemia     Rx / DC Orders   ED Discharge Orders     None        Note:  This document was prepared using Dragon voice recognition software and may include unintentional dictation errors.   Corena Herter, MD 10/25/23 [phone]

## 2023-10-25 NOTE — Assessment & Plan Note (Signed)
+   weakness, fatigue, demand NSTEMI in setting of acute blood loss anemia Baseline hgb 12; hgb 5 today  Pending pRBC transfusion  Trend hgb  Transfuse for hgb <7  Monitor

## 2023-10-25 NOTE — Assessment & Plan Note (Addendum)
Baseline hx/o peripheral Vascular Disease s/p 09/21/2017 RLE revascularization Transitioned from eliquis to aspirin and statin 05/2023 secondary to GI bleeding  08/2023 Had mechanical thrombectomy of the right SFA, popliteal artery, tibioperoneal trunk and peroneal artery. He also had a stent placed to the right SFA with angioplasty of the peroneal artery and tibioperoneal trunk- transitioned back to xarelto  Consider holding xarelto/AC indefinitely in setting of recurrent GI bleeding  Monitor

## 2023-10-25 NOTE — H&P (Addendum)
History and Physical    [name]:[id number] DOB: [birth year] DOA: 10/25/2023 DOS: the patient was seen and examined on 10/25/2023 PCP: Gracelyn Nurse, MD  Patient coming from: Home  Chief Complaint:  Chief Complaint  Patient presents with   Weakness   HPI: Bryan Welch is a 87 y.o. male with medical history significant of Hypertension, Hyperlipidemia, Peripheral Vascular Disease s/p 09/21/2017 RLE revascularization, Chronic Kidney Disease Stage 3B, chronic iron deficiency anemia, and anxiety presenting with GI bleed.  History from patient as well as family at the bedside.  Per report, patient with worsening weakness over the past 3 to 4 days.  Per the son, weakness to the degree that he could barely get out of bed today.  Positive malaise.  Does report having some black/dark brown stools over the same timeframe.  Noted to have been admitted July of this year with GI bleeding.  Had small intestine GI bleeding on VCE.  Was transition from Eliquis to aspirin and statin in setting of peripheral vascular disease.  Patient noted to have been re-evaluated by vascular surgery October 2024. Patient was taken back to the vascular lab on 08/10/2023 for mechanical thrombectomy of the right SFA, popliteal artery, tibioperoneal trunk and peroneal artery. He also had a stent placed to the right SFA with angioplasty of the peroneal artery and tibioperoneal trunk. Patient transitioned from aspirin and statin to xarelto.  1 episode of transient chest pain on arrival to the ER.  Mild shortness of breath.  No focal hemiparesis or confusion.  Lower extremity function has been overall stable. Presented to the ER afebrile, systolic pressures 90s to 110s.  Satting well on room air.  Hemoglobin 5.4 with baseline hemoglobin around 12.  Platelets 237, troponin 194.  CT angio GI bleed study negative for any active GI bleeding.  Noted 4.3 cm infrarenal AAA.  Positive severe stenosis involving the SMA.   Dr. Allegra Lai with GI consulted. Review of Systems: As mentioned in the history of present illness. All other systems reviewed and are negative. Past Medical History:  Diagnosis Date   Arthritis    Toes   Coronary artery disease    History of hiatal hernia    Hyperlipidemia    Hypertension    Kidney disorder    "right kidney doesn't work"   Presence of stent in artery    "4 in legs, 1 in neck"   Vascular abnormality    Wears dentures    full upper and lower   Past Surgical History:  Procedure Laterality Date   AMPUTATION TOE Right 09/16/2021   Procedure: AMPUTATION TOE;  Surgeon: Rosetta Posner, DPM;  Location: [facility];  Service: Podiatry;  Laterality: Right;   BACK SURGERY     CAROTID STENT Right    CATARACT EXTRACTION W/PHACO Left 06/17/2019   Procedure: CATARACT EXTRACTION PHACO AND INTRAOCULAR LENS PLACEMENT (IOC) LEFT;  Surgeon: Nevada Crane, MD;  Location: [facility];  Service: Ophthalmology;  Laterality: Left;   CATARACT EXTRACTION W/PHACO Right 07/08/2019   Procedure: CATARACT EXTRACTION PHACO AND INTRAOCULAR LENS PLACEMENT (IOC) right  00:39.2  10.8%  5.02  ;  Surgeon: Nevada Crane, MD;  Location: [facility];  Service: Ophthalmology;  Laterality: Right;   COLONOSCOPY WITH PROPOFOL N/A 04/13/2023   Procedure: COLONOSCOPY WITH PROPOFOL;  Surgeon: Jaynie Collins, DO;  Location: [facility];  Service: Gastroenterology;  Laterality: N/A;   ENTEROSCOPY N/A 05/10/2023   Procedure: ENTEROSCOPY;  Surgeon: Midge Minium,  MD;  Location: [facility] ENDOSCOPY;  Service: Endoscopy;  Laterality: N/A;   ESOPHAGOGASTRODUODENOSCOPY (EGD) WITH PROPOFOL N/A 04/13/2023   Procedure: ESOPHAGOGASTRODUODENOSCOPY (EGD) WITH PROPOFOL;  Surgeon: Jaynie Collins, DO;  Location: [facility] ENDOSCOPY;  Service: Gastroenterology;  Laterality: N/A;   EYE SURGERY     GIVENS CAPSULE STUDY N/A 05/09/2023   Procedure: GIVENS CAPSULE STUDY;  Surgeon: Midge Minium, MD;   Location: [facility];  Service: Endoscopy;  Laterality: N/A;   HOT HEMOSTASIS  05/10/2023   Procedure: HOT HEMOSTASIS (ARGON PLASMA COAGULATION/BICAP);  Surgeon: Midge Minium, MD;  Location: [facility] ENDOSCOPY;  Service: Endoscopy;;   LEG SURGERY Bilateral    stent placement   LOWER EXTREMITY ANGIOGRAPHY Right 08/19/2021   Procedure: LOWER EXTREMITY ANGIOGRAPHY;  Surgeon: Annice Needy, MD;  Location: [facility] IN[facility];  Service: Cardiovascular;  Laterality: Right;   LOWER EXTREMITY ANGIOGRAPHY Right 08/09/2023   Procedure: Lower Extremity Angiography;  Surgeon: Annice Needy, MD;  Location: [facility] CV [facility];  Service: Cardiovascular;  Laterality: Right;   LOWER EXTREMITY ANGIOGRAPHY Right 08/10/2023   Procedure: Lower Extremity Angiography;  Surgeon: Annice Needy, MD;  Location: [facility] IN[facility] CV LAB;  Service: Cardiovascular;  Laterality: Right;   PERIPHERAL VASCULAR BALLOON ANGIOPLASTY Right 09/21/2017   Procedure: PERIPHERAL VASCULAR BALLOON ANGIOPLASTY;  Surgeon: Annice Needy, MD;  Location: [facility] CV [facility];  Service: Cardiovascular;  Laterality: Right;   REVERSE SHOULDER ARTHROPLASTY Right 11/26/2019   Procedure: REVERSE SHOULDER ARTHROPLASTY;  Surgeon: Christena Flake, MD;  Location: [facility] ORS;  Service: Orthopedics;  Laterality: Right;   Social History:  reports that he quit smoking about 23 years ago. His smoking use included cigarettes. He has never used smokeless tobacco. He reports that he does not currently use alcohol. He reports that he does not use drugs.  Allergies  Allergen Reactions   Codeine Itching    Family History  Problem Relation Age of Onset   Bladder Cancer Mother    Prostate cancer Father    Prostate cancer Brother    Diabetes Maternal Grandfather     Prior to Admission medications   Medication Sig Start Date End Date Taking? Authorizing Provider  acetaminophen (TYLENOL) 500 MG tablet Take 1,000 mg by mouth at bedtime.    [provider]   ALPRAZolam Prudy Feeler) 0.25 MG tablet Take 0.25 mg by mouth at bedtime. 09/07/17   [provider]  aspirin EC 81 MG tablet Take 81 mg daily by mouth.     [provider]  Cholecalciferol 25 MCG (1000 UT) capsule Take 2,000 Units by mouth daily.    [provider]  diphenhydrAMINE (BENADRYL) 25 mg capsule Take 25 mg at bedtime by mouth.     [provider]  gabapentin (NEURONTIN) 300 MG capsule Take 300 mg by mouth 2 (two) times daily. 12/27/21 05/18/24  [provider]  hydrochlorothiazide (HYDRODIURIL) 25 MG tablet Take 1 tablet (25 mg total) by mouth daily. 03/28/21   Triplett, Rulon Eisenmenger B, FNP  hydrochlorothiazide (HYDRODIURIL) 25 MG tablet Take 1 tablet by mouth daily. 06/14/23   [provider]  Multiple Vitamins-Minerals (PRESERVISION AREDS 2 PO) Take 1 tablet by mouth 2 (two) times daily.    [provider]  omeprazole (PRILOSEC) 20 MG capsule Take 20 mg by mouth at bedtime.    [provider]  potassium chloride (KLOR-CON) 10 MEQ tablet Take 10 mEq by mouth daily. 02/09/23   [provider]  pravastatin (PRAVACHOL) 20 MG tablet Take 20 mg by  mouth at bedtime. 08/30/16   [provider]  tamsulosin (FLOMAX) 0.4 MG CAPS capsule Take 0.4 mg by mouth daily. 01/06/18   [provider]  traMADol (ULTRAM) 50 MG tablet Take 1 tablet (50 mg total) by mouth every 6 (six) hours as needed for moderate pain. 08/11/23   Pace, Brien R, NP  XARELTO 20 MG TABS tablet Take 20 mg by mouth daily.    [provider]    Physical Exam: Vitals:   10/25/23 0731 10/25/23 0734 10/25/23 0949 10/25/23 1014  BP:   (!) 116/42 96/72  Pulse:   93 94  Resp:   19 (!) 21  Temp:   98.1 F (36.7 C) 98 F (36.7 C)  TempSrc:   Oral Oral  SpO2:  100% 100% 100%  Weight: 59 kg     Height: 5\' 6"  (1.676 m)      Physical Exam Constitutional:      Appearance: He is normal weight.  HENT:     Head: Normocephalic and atraumatic.      Nose: Nose normal.     Mouth/Throat:     Mouth: Mucous membranes are moist.  Eyes:     Pupils: Pupils are equal, round, and reactive to light.  Cardiovascular:     Rate and Rhythm: Normal rate and regular rhythm.  Pulmonary:     Effort: Pulmonary effort is normal.  Abdominal:     General: Bowel sounds are normal.  Musculoskeletal:        General: Normal range of motion.  Skin:    General: Skin is warm.  Neurological:     General: No focal deficit present.  Psychiatric:        Mood and Affect: Mood normal.     Data Reviewed:  There are no new results to review at this time.   CT ANGIO GI BLEED CLINICAL DATA:  Weakness, fatigue.  EXAM: CTA ABDOMEN AND PELVIS WITHOUT AND WITH CONTRAST  TECHNIQUE: Multidetector CT imaging of the abdomen and pelvis was performed using the standard protocol during bolus administration of intravenous contrast. Multiplanar reconstructed images and MIPs were obtained and reviewed to evaluate the vascular anatomy.  RADIATION DOSE REDUCTION: This exam was performed according to the departmental dose-optimization program which includes automated exposure control, adjustment of the mA and/or kV according to patient size and/or use of iterative reconstruction technique.  CONTRAST:  80mL OMNIPAQUE IOHEXOL 350 MG/ML SOLN  COMPARISON:  May 08, 2023.  FINDINGS: VASCULAR  Aorta: 4.3 cm saccular infrarenal abdominal aortic aneurysm is noted. No dissection is noted.  Celiac: Patent without evidence of aneurysm, dissection, vasculitis or significant stenosis.  SMA: Severe stenosis is noted at origin secondary to calcified plaque.  Renals: Severe stenosis is noted at origin of right renal artery secondary to calcified plaque. Mild stenosis is noted at proximal portion of left renal artery secondary to calcified plaque.  IMA: Occluded at origin.  Inflow: Grossly patent with patent stent in the proximal right external iliac  artery.  Proximal Outflow: Bilateral common femoral and visualized portions of the superficial and profunda femoral arteries are patent without evidence of aneurysm, dissection, vasculitis or significant stenosis.  Veins: No obvious venous abnormality within the limitations of this arterial phase study.  Review of the MIP images confirms the above findings.  NON-VASCULAR  Lower chest: No acute abnormality.  Hepatobiliary: Minimal cholelithiasis. No biliary dilatation. Liver is unremarkable.  Pancreas: Unremarkable. No pancreatic ductal dilatation or surrounding inflammatory changes.  Spleen: Normal in size  without focal abnormality.  Adrenals/Urinary Tract: Adrenal glands appear normal. Right renal atrophy is noted. Chronic right hydronephrosis. No ureteral dilatation is noted. Urinary bladder is unremarkable.  Stomach/Bowel: Stomach is within normal limits. Appendix appears normal. No evidence of bowel wall thickening, distention, or inflammatory changes. Sigmoid diverticulosis without inflammation. No definite evidence of contrast extravasation to suggest active gastrointestinal bleeding.  Lymphatic: No adenopathy.  Reproductive: Mild prostatic enlargement.  Other: No ascites or hernia.  Musculoskeletal: Multilevel degenerative changes are noted in lumbar spine. No acute osseous abnormality seen.  IMPRESSION: VASCULAR  No definite evidence of active gastrointestinal bleeding.  4.3 cm infrarenal abdominal aortic aneurysm. Recommend follow-up CT or MR as appropriate in 12 months and referral to or continued care with vascular specialist. (Ref.: J Vasc Surg. 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 Mild stenosis is noted at origin of left renal artery. 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  Aortic Atherosclerosis  (ICD10-I70.0).  NON-VASCULAR  Minimal cholelithiasis.  Sigmoid diverticulosis without bleeding.  Mild prostatic enlargement.  Chronic right hydronephrosis.  Electronically Signed   By: Lupita Raider M.D.   On: 10/25/2023 10:12  Lab Results  Component Value Date   WBC 10.8 (H) 10/25/2023   HGB 5.4 (L) 10/25/2023   HCT 18.1 (L) 10/25/2023   MCV 91.9 10/25/2023   PLT 237 10/25/2023   Last metabolic panel Lab Results  Component Value Date   GLUCOSE 151 (H) 10/25/2023   NA 139 10/25/2023   K 3.4 (L) 10/25/2023   CL 106 10/25/2023   CO2 19 (L) 10/25/2023   BUN 62 (H) 10/25/2023   CREATININE 1.63 (H) 10/25/2023   GFRNONAA 41 (L) 10/25/2023   CALCIUM 8.5 (L) 10/25/2023   PHOS 3.5 05/12/2023   PROT 5.9 (L) 10/25/2023   ALBUMIN 3.2 (L) 10/25/2023   BILITOT 0.5 10/25/2023   ALKPHOS 47 10/25/2023   AST 21 10/25/2023   ALT 12 10/25/2023   ANIONGAP 14 10/25/2023    Assessment and Plan: NSTEMI (non-ST elevated myocardial infarction) (HCC) Trop 190s w/ transient CP in setting of symptomatic anemia w/ hgb 5 EKG sinus tach  Suspect secondary demand ischemia in setting of anemia Will trend trop w/ pRC transfusion  Monitor  Cardiology evaluation as clinically appropriate    GI bleeding Progressive weakness and fatigue over 3 to 4 days with noted dark/maroon-colored stools Admission July 2024 for similar issues with small intestine GI bleeding on VCE (transitioned from eliquis to ASA and statin) Noted baseline extensive peripheral vascular disease status post vascular intervention and CAD on Xarelto s/p revascularization procedure with vascular surgery 08/2023 Baseline hemoglobin around 12 Hemoglobin 5 today Hold Xarelto CT GI angio pending PRBC transfusion started in the ER Trend hemoglobin Transfuse if hemoglobin is 7 Dr. Allegra Lai with gastroenterology consulted with plan for endoscopic evaluation Follow-up GI recommendations   Symptomatic anemia + weakness,  fatigue, demand NSTEMI in setting of acute blood loss anemia Baseline hgb 12; hgb 5 today  Pending pRBC transfusion  Trend hgb  Transfuse for hgb <7  Monitor   Peripheral vascular disease (HCC) Baseline hx/o peripheral Vascular Disease s/p 09/21/2017 RLE revascularization Transitioned from eliquis to aspirin and statin 05/2023 secondary to GI bleeding  08/2023 Had mechanical thrombectomy of the right SFA, popliteal artery, tibioperoneal trunk and peroneal artery. He also had a stent placed to the right SFA with angioplasty of the peroneal artery and tibioperoneal trunk- transitioned back to xarelto  Consider holding xarelto/AC indefinitely in setting of recurrent GI bleeding  Monitor   Chronic kidney disease, stage 3b (HCC) Creatinine 1.6 with GFR in the 40s At baseline Monitor  HTN (hypertension) LLN BP on presentation  Hold BP regimen for now         Advance Care Planning:   Code Status: Full Code   Consults: GI   Family Communication: Family at the bedside   Severity of Illness: The appropriate patient status for this patient is INPATIENT. Inpatient status is judged to be reasonable and necessary in order to provide the required intensity of service to ensure the patient's safety. [name] presenting symptoms, physical exam findings, and initial radiographic and laboratory data in the context of their chronic comorbidities is felt to place them at high risk for further clinical deterioration. Furthermore, it is not anticipated that the patient will be medically stable for discharge from the hospital within 2 midnights of admission.   * I certify that at the point of admission it is my clinical judgment that the patient will require inpatient hospital care spanning beyond 2 midnights from the point of admission due to high intensity of service, high risk for further deterioration and high frequency of surveillance required.*  Author: Floydene Flock, MD 10/25/2023  10:22 AM  For on call review [email or web address].

## 2023-10-25 NOTE — ED Notes (Signed)
Informed RN bed assigned 

## 2023-10-26 ENCOUNTER — Telehealth: Payer: Self-pay | Admitting: Gastroenterology

## 2023-10-26 DIAGNOSIS — D500 Iron deficiency anemia secondary to blood loss (chronic): Secondary | ICD-10-CM | POA: Diagnosis not present

## 2023-10-26 LAB — TROPONIN I (HIGH SENSITIVITY): Troponin I (High Sensitivity): 669 ng/L (ref <18)

## 2023-10-26 LAB — VITAMIN B12: Vitamin B-12: 350 pg/mL (ref 180–914)

## 2023-10-26 LAB — COMPREHENSIVE METABOLIC PANEL
ALT: 12 U/L (ref 0–44)
AST: 16 U/L (ref 15–41)
Albumin: 3 g/dL — ABNORMAL LOW (ref 3.5–5.0)
Alkaline Phosphatase: 45 U/L (ref 38–126)
Anion gap: 11 (ref 5–15)
BUN: 52 mg/dL — ABNORMAL HIGH (ref 8–23)
CO2: 20 mmol/L — ABNORMAL LOW (ref 22–32)
Calcium: 8 mg/dL — ABNORMAL LOW (ref 8.9–10.3)
Chloride: 108 mmol/L (ref 98–111)
Creatinine, Ser: 1.55 mg/dL — ABNORMAL HIGH (ref 0.61–1.24)
GFR, Estimated: 44 mL/min — ABNORMAL LOW (ref >60)
Glucose, Bld: 105 mg/dL — ABNORMAL HIGH (ref 70–99)
Potassium: 3.5 mmol/L (ref 3.5–5.1)
Sodium: 139 mmol/L (ref 135–145)
Total Bilirubin: 0.6 mg/dL (ref <1.2)
Total Protein: 5.3 g/dL — ABNORMAL LOW (ref 6.5–8.1)

## 2023-10-26 LAB — HEMOGLOBIN AND HEMATOCRIT, BLOOD
HCT: 27.2 % — ABNORMAL LOW (ref 39.0–52.0)
HCT: 27 % — ABNORMAL LOW (ref 39.0–52.0)
Hemoglobin: 9 g/dL — ABNORMAL LOW (ref 13.0–17.0)
Hemoglobin: 8.9 g/dL — ABNORMAL LOW (ref 13.0–17.0)

## 2023-10-26 MED ORDER — POTASSIUM CHLORIDE CRYS ER 20 MEQ PO TBCR
40.0000 meq | EXTENDED_RELEASE_TABLET | Freq: Once | ORAL | Status: AC
  Administered 2023-10-26: 40 meq via ORAL
  Filled 2023-10-26: qty 2

## 2023-10-26 MED ORDER — IRON SUCROSE 300 MG IVPB - SIMPLE MED
300.0000 mg | Freq: Once | Status: AC
  Administered 2023-10-26: 300 mg via INTRAVENOUS
  Filled 2023-10-26: qty 300

## 2023-10-26 MED ORDER — CALCIUM CARBONATE ANTACID 500 MG PO CHEW
400.0000 mg | CHEWABLE_TABLET | Freq: Three times a day (TID) | ORAL | Status: DC | PRN
  Administered 2023-10-26: 400 mg via ORAL
  Filled 2023-10-26: qty 2

## 2023-10-26 MED ORDER — SODIUM CHLORIDE 0.9 % IV SOLN: INTRAVENOUS | Status: DC

## 2023-10-26 NOTE — Evaluation (Signed)
Physical Therapy Evaluation Patient Details Name: Bryan Welch MRN: [id number] DOB: 1936-02-23 Today's Date: 10/26/2023  History of Present Illness  Pt is an 87 y/o M admitted on 10/25/23 after presenting with worsening weakness. Pt found to have Hgb of 5.4 & is being treated for GI bleed & NSTEMI. PMH: HTN, HLD, PVD, CKD 3B, chronic iron deficiency anemia, anxiety  Clinical Impression  Pt seen for PT evaluation with pt agreeable to tx. Pt reports prior to admission he was mod I with walking stick, denies falls, lives with wife in a 1 level home with 2 steps with wide set B rails to enter.  On this date, pt is able to complete bed mobility with mod I, STS independently & ambulate without AD with supervision. Gait distance limited by fatigue. Will continue to follow pt acutely to address endurance, strengthening & stair negotiation.      If plan is discharge home, recommend the following: Help with stairs or ramp for entrance   Can travel by private vehicle        Equipment Recommendations None recommended by PT  Recommendations for Other Services       Functional Status Assessment Patient has had a recent decline in their functional status and demonstrates the ability to make significant improvements in function in a reasonable and predictable amount of time.     Precautions / Restrictions Precautions Precautions: Fall Restrictions Weight Bearing Restrictions Per Provider Order: No      Mobility  Bed Mobility Overal bed mobility: Modified Independent                  Transfers Overall transfer level: Independent Equipment used: None               General transfer comment: STS from EOB    Ambulation/Gait Ambulation/Gait assistance: Supervision Gait Distance (Feet): 110 Feet Assistive device: None Gait Pattern/deviations: Step-through pattern, Decreased step length - right, Decreased step length - left, Decreased dorsiflexion - right, Decreased  dorsiflexion - left Gait velocity: decreased     General Gait Details: gait distance limited by fatigue (pt worked with OT prior to PT evaluation)  Acupuncturist Bed    Modified Rankin (Stroke Patients Only)       Balance Overall balance assessment: Needs assistance   Sitting balance-Leahy Scale: Good     Standing balance support: During functional activity, No upper extremity supported Standing balance-Leahy Scale: Good                               Pertinent Vitals/Pain Pain Assessment Pain Assessment: No/denies pain    Home Living Family/patient expects to be discharged to:: Private residence Living Arrangements: Spouse/significant other Available Help at Discharge: Family;Available 24 hours/day Type of Home: House Home Access: Stairs to enter Entrance Stairs-Rails: Right;Left (wideset) Entrance Stairs-Number of Steps: 2   Home Layout: One level Home Equipment: Grab bars - toilet;Other (comment);Cane - single point      Prior Function Prior Level of Function : Independent/Modified Independent;Driving             Mobility Comments: using walking stick at baseline, denies falls       Extremity/Trunk Assessment   Upper Extremity Assessment Upper Extremity Assessment: Overall WFL for tasks assessed    Lower Extremity Assessment Lower Extremity Assessment: Overall WFL for tasks assessed  Cervical / Trunk Assessment Cervical / Trunk Assessment: Kyphotic  Communication   Communication Communication: No apparent difficulties  Cognition Arousal: Alert Behavior During Therapy: WFL for tasks assessed/performed Overall Cognitive Status: Within Functional Limits for tasks assessed                                 General Comments: very pleasant man        General Comments General comments (skin integrity, edema, etc.): mild SOB with bed mobility, SPO2 reading as low as 76% but very  poor inconsistent waveform & pt not in distress, SPO2 >90% upon PT departure    Exercises     Assessment/Plan    PT Assessment Patient needs continued PT services  PT Problem List Decreased strength;Decreased coordination;Decreased range of motion;Decreased activity tolerance;Decreased balance;Decreased mobility;Cardiopulmonary status limiting activity       PT Treatment Interventions DME instruction;Therapeutic exercise;Gait training;Balance training;Stair training;Functional mobility training;Therapeutic activities;Patient/family education    PT Goals (Current goals can be found in the Care Plan section)  Acute Rehab PT Goals Patient Stated Goal: get better, go home PT Goal Formulation: With patient Time For Goal Achievement: 11/09/23 Potential to Achieve Goals: Good    Frequency Min 1X/week     Co-evaluation               AM-PAC PT "6 Clicks" Mobility  Outcome Measure Help needed turning from your back to your side while in a flat bed without using bedrails?: None Help needed moving from lying on your back to sitting on the side of a flat bed without using bedrails?: None Help needed moving to and from a bed to a chair (including a wheelchair)?: None Help needed standing up from a chair using your arms (e.g., wheelchair or bedside chair)?: None Help needed to walk in hospital room?: None Help needed climbing 3-5 steps with a railing? : A Little 6 Click Score: 23    End of Session   Activity Tolerance: Patient tolerated treatment well Patient left: in bed;with call bell/phone within reach;with bed alarm set Nurse Communication: Mobility status PT Visit Diagnosis: Unsteadiness on feet (R26.81);Muscle weakness (generalized) (M62.81)    Time: 4540-9811 PT Time Calculation (min) (ACUTE ONLY): 14 min   Charges:   PT Evaluation $PT Eval Low Complexity: 1 Low   PT General Charges $$ ACUTE PT VISIT: 1 Visit         Aleda Grana, PT, DPT 10/26/23, 10:53  AM   Sandi Mariscal 10/26/2023, 10:52 AM

## 2023-10-26 NOTE — Evaluation (Signed)
Occupational Therapy Evaluation Patient Details Name: URI ARCHILLA MRN: [id number] DOB: 1936/06/03 Today's Date: 10/26/2023   History of Present Illness Bryan Welch is a 87 y.o. male with medical history significant of Hypertension, Hyperlipidemia, Peripheral Vascular Disease s/p 09/21/2017 RLE revascularization, Chronic Kidney Disease Stage 3B, chronic iron deficiency anemia, and anxiety presenting with GI bleed.   Clinical Impression   Mr Furness was seen for OT evaluation this date. Prior to hospital admission, pt was MOD I using walking stick. Pt lives with family. Pt currently MOD I don B socks in sitting. IND sit<>stand at bed and SBA for functional mobility ~80 ft no AD use. Educated pt on ECS and importance for mobility for functional strengthening. No skilled acute OT needs identified, will sign off. Upon hospital discharge, recommend no OT follow up.    If plan is discharge home, recommend the following: Help with stairs or ramp for entrance    Functional Status Assessment  Patient has not had a recent decline in their functional status  Equipment Recommendations  None recommended by OT    Recommendations for Other Services       Precautions / Restrictions Precautions Precautions: None Restrictions Weight Bearing Restrictions Per Provider Order: No      Mobility Bed Mobility Overal bed mobility: Modified Independent                  Transfers Overall transfer level: Independent                        Balance Overall balance assessment: Mild deficits observed, not formally tested                                         ADL either performed or assessed with clinical judgement   ADL Overall ADL's : Needs assistance/impaired                                       General ADL Comments: MOD I don B socks in sitting. SBA for functional mobility ~80 ft      Pertinent Vitals/Pain Pain  Assessment Pain Assessment: No/denies pain     Extremity/Trunk Assessment Upper Extremity Assessment Upper Extremity Assessment: Overall WFL for tasks assessed   Lower Extremity Assessment Lower Extremity Assessment: Overall WFL for tasks assessed       Communication Communication Communication: No apparent difficulties   Cognition Arousal: Alert Behavior During Therapy: WFL for tasks assessed/performed Overall Cognitive Status: Within Functional Limits for tasks assessed                                       General Comments  reports mild shortness of breath with activity however poor pleth unable to read O2, on return to bed with rest break Spo2 100%            Home Living Family/patient expects to be discharged to:: Private residence Living Arrangements: Spouse/significant other Available Help at Discharge: Family;Available 24 hours/day Type of Home: House Home Access: Stairs to enter [employer] of Steps: 2 Entrance Stairs-Rails: Right;Left Home Layout: One level     Bathroom Shower/Tub: Producer, television/film/video: Standard  Home Equipment: Grab bars - toilet;Other (comment);Cane - single point          Prior Functioning/Environment Prior Level of Function : Independent/Modified Independent;Driving             Mobility Comments: using walking stick at baseline          OT Problem List: Decreased strength;Decreased range of motion;Decreased activity tolerance;Impaired balance (sitting and/or standing)         OT Goals(Current goals can be found in the care plan section) Acute Rehab OT Goals Patient Stated Goal: to go home OT Goal Formulation: With patient Time For Goal Achievement: 10/26/23 Potential to Achieve Goals: Good   AM-PAC OT "6 Clicks" Daily Activity     Outcome Measure Help from another person eating meals?: None Help from another person taking care of personal grooming?: None Help from  another person toileting, which includes using toliet, bedpan, or urinal?: None Help from another person bathing (including washing, rinsing, drying)?: A Little Help from another person to put on and taking off regular upper body clothing?: None Help from another person to put on and taking off regular lower body clothing?: None 6 Click Score: 23   End of Session    Activity Tolerance: Patient tolerated treatment well Patient left: in bed;with bed alarm set;with call bell/phone within reach  OT Visit Diagnosis: Other abnormalities of gait and mobility (R26.89)                Time: 1761-6073 OT Time Calculation (min): 12 min Charges:  OT General Charges $OT Visit: 1 Visit OT Evaluation $OT Eval Low Complexity: 1 Low  Kathie Dike, M.S. OTR/L  10/26/23, 9:28 AM  ascom [phone]

## 2023-10-26 NOTE — TOC Initial Note (Signed)
Transition of Care [facility] - Initial/Assessment Note    Patient Details  Name: Bryan Welch MRN: [id number] Date of Birth: 04/04/1936  [facility] CM/SW Contact:    Marquita Palms, LCSW Phone Number: 10/26/2023, 2:29 PM  Clinical Narrative:                  CSW met with patient bedside. Patient not agreeable to SNF or HH at this time. Pharmacy [facility]. PCP appointment next week. No DME needs at this time. He reports he uses his walking bamboo stick for the most part. Patient reports he lives with his wife but his children both live close by. Patient reports he is hoping to be living to go home and care for his wife. No other needs at this time. Expected Discharge Plan: Home/Self Care Barriers to Discharge: Continued Medical Work up   Patient Goals and CMS Choice Patient states their goals for this hospitalization and ongoing recovery are:: to be bale to go home and care fo his wife.          Expected Discharge Plan and Services     Post Acute Care Choice: NA Living arrangements for the past 2 months: Single Family Home                                      Prior Living Arrangements/Services Living arrangements for the past 2 months: Single Family Home   Patient language and need for interpreter reviewed:: No        Need for Family Participation in Patient Care: Yes (Comment) Care giver support system in place?: Yes (comment) Current home services: Other (comment) Criminal Activity/Legal Involvement Pertinent to Current Situation/Hospitalization: No - Comment as needed  Activities of Daily Living      Permission Sought/Granted                  Emotional Assessment Appearance:: Disheveled Attitude/Demeanor/Rapport: Gracious Affect (typically observed): Hopeful Orientation: : Oriented to Self, Oriented to Place, Oriented to  Time, Oriented to Situation      Admission diagnosis:  Anemia [D64.9] Patient Active Problem  List   Diagnosis Date Noted   NSTEMI (non-ST elevated myocardial infarction) (HCC) 10/25/2023   Acute blood loss anemia 10/25/2023   Melena 10/25/2023   Atherosclerosis of artery of extremity with rest pain (HCC) 08/09/2023   Sepsis (HCC) 06/09/2023   Gastroenteritis due to COVID-19 virus 06/09/2023   History of GI bleed 06/09/2023   Generalized weakness 06/09/2023   Gastrointestinal tract imaging abnormality 05/10/2023   Upper gastrointestinal hemorrhage due to angiodysplasia 05/10/2023   Anemia 05/10/2023   Acute GI bleeding 05/10/2023   GI bleeding 05/08/2023   Chronic kidney disease, stage 3b (HCC) 05/08/2023   Hypokalemia 05/08/2023   HTN (hypertension) 05/08/2023   CAD (coronary artery disease) 05/08/2023   Iron deficiency anemia 04/21/2023   B12 deficiency 04/21/2023   Symptomatic anemia 03/22/2023   Ulcer of aorta (HCC) 03/22/2023   CKD stage 3a, GFR 45-59 ml/min (HCC) 03/22/2023   Atherosclerosis of native arteries of the extremities with ulceration (HCC) 09/28/2021   Atherosclerosis of native arteries of extremity with intermittent claudication (HCC) 08/10/2021   Olecranon bursitis, right elbow 05/25/2020   UPJ (ureteropelvic junction) obstruction 09/13/2018   CRF (chronic renal failure), stage 3 (moderate) 07/25/2018   Limb ischemia 09/20/2017   Peripheral vascular disease (HCC) 04/05/2017   AAA (abdominal  aortic aneurysm) without rupture (HCC) 04/05/2017   Hyperlipidemia 11/15/2016   Benign essential hypertension 11/15/2016   Carotid stenosis 11/15/2016   PCP:  Gracelyn Nurse, MD [facility]:   [facility] [address] [place] - [address] [place] [address] Phone: [phone] Fax: [phone]     Social Drivers of Health (SDOH) Social History: SDOH Screenings   Food Insecurity: No Food Insecurity (08/09/2023)  Housing: Patient Declined (08/09/2023)  Transportation Needs: No Transportation Needs (08/09/2023)  Utilities: Not At  Risk (08/09/2023)  Depression (PHQ2-9): Low Risk  (04/21/2023)  Tobacco Use: Medium Risk (10/25/2023)   SDOH Interventions:     Readmission Risk Interventions    10/26/2023    2:22 PM 05/11/2023   12:24 PM  Readmission Risk Prevention Plan  Transportation Screening Complete Complete  PCP or Specialist Appt within 5-7 Days  Complete  PCP or Specialist Appt within 3-5 Days Complete   Home Care Screening  Complete  Medication Review (RN CM)  Complete  HRI or Home Care Consult Complete   Social Work Consult for Recovery Care Planning/Counseling Complete   Palliative Care Screening Not Applicable   Medication Review Oceanographer) Complete

## 2023-10-26 NOTE — Progress Notes (Addendum)
Progress Note    Bryan Welch  ZOX:[id number] DOB: 1936-10-14  DOA: 10/25/2023 PCP: Gracelyn Nurse, MD      Brief Narrative:    Medical records reviewed and are as summarized below:  Bryan Welch is a 87 y.o. male with medical history significant of Hypertension, Hyperlipidemia, Peripheral Vascular Disease s/p 09/21/2017 RLE revascularization, Chronic Kidney Disease Stage 3B, chronic iron deficiency anemia, anxiety, who presented to the hospital because of general weakness and dark stools.  He had been feeling weak and having dark stools for about 3 to 4 days.  He has a complete end of transient chest pain on arrival to the ED.  Noted to have been admitted July of this year with GI bleeding. Had small intestine GI bleeding on VCE.  She was transition from Eliquis to aspirin and statin in the setting of PVD.  He was evaluated by vascular surgeon in October 2024.  He underwent mechanical thrombectomy of right SFA, popliteal artery, tibioperoneal trunk and peroneal artery on 08/10/2023.  Stent was placed to right SFA with angioplasty of peroneal artery and tibioperoneal trunk.  He was transitioned from aspirin to Xarelto.   He was found to have severe anemia with hemoglobin of 5.4 on admission.    Assessment/Plan:   Principal Problem:   Anemia Active Problems:   Symptomatic anemia   GI bleeding   NSTEMI (non-ST elevated myocardial infarction) (HCC)   Peripheral vascular disease (HCC)   Chronic kidney disease, stage 3b (HCC)   HTN (hypertension)   Acute blood loss anemia   Melena    Body mass index is 20.98 kg/m.   Severe anemia, iron deficiency anemia: Hemoglobin proved from 5.4-8.9.  S/p transfusion with 2 units of PRBCs.  Monitor H&H and transfuse as needed. Patient will receive 1 dose of IV iron sucrose today. Ferritin level was 14.   Dark stools with history of GI bleed: Xarelto has been held.  She has been evaluated by Dr. Allegra Lai, gastroenterologist.   Plan for endoscopic workup but will have to wait for Xarelto washout   Elevated troponins, probable type II NSTEMI from severe anemia: Peak troponin 688.  2D echo has been ordered for further evaluation.  No indication for anticoagulation at this time especially in the setting of severe anemia and suspected GI bleeding.   PVD: Xarelto on hold. S/p mechanical thrombectomy of right SFA, popliteal artery, tibioperoneal trunk and peroneal artery on 08/10/2023.  Stent was placed to right SFA with angioplasty of peroneal artery and tibioperoneal trunk.    Hypokalemia: Improving.  Continue potassium repletion.   CKD stage IIIb: Creatinine is stable   General weakness: Likely due to severe anemia.  PT evaluation.      Diet Order             DIET DYS 3 Room service appropriate? Yes; Fluid consistency: Thin  Diet effective now                            Consultants: Gastroenterologist  Procedures: None    Medications:    pantoprazole  40 mg Oral BID AC   sodium chloride flush  10 mL Intravenous Q12H   sodium chloride flush  10 mL Intravenous Q12H   Continuous Infusions:  iron sucrose       Anti-infectives (From admission, onward)    None              Family Communication/Anticipated  D/C date and plan/Code Status   DVT prophylaxis: Place TED hose Start: 10/25/23 1008     Code Status: Full Code  Family Communication: None Disposition Plan: Plan to discharge home   Status is: Inpatient Remains inpatient appropriate because: Workup in progress       Subjective:   Interval events noted.  No bowel movement today.  No shortness of breath or chest pain.  He said he had chest pain yesterday but that has resolved.  He had been having fark stools prior to coming to the hospital.  Objective:    Vitals:   10/26/23 0900 10/26/23 0915 10/26/23 1129 10/26/23 1130  BP: (!) 130/56  127/72 127/72  Pulse: 86 73 70 69  Resp: 20 (!) 21 15 19    Temp: 98.2 F (36.8 C)  97.8 F (36.6 C)   TempSrc:   Oral   SpO2:  100% 100% 100%  Weight:      Height:       No data found.   Intake/Output Summary (Last 24 hours) at 10/26/2023 1336 Last data filed at 10/25/2023 1440 Gross per 24 hour  Intake 320 ml  Output --  Net 320 ml   Filed Weights   10/25/23 0731  Weight: 59 kg    Exam:  GEN: NAD SKIN: Warm and dry EYES: Warm and dry ENT: MMM CV: RRR PULM: CTA B ABD: soft, ND, NT, +BS CNS: AAO x 3, non focal EXT: No edema or tenderness         Data Reviewed:   I have personally reviewed following labs and imaging studies:  Labs: Labs show the following:   Basic Metabolic Panel: Recent Labs  Lab 10/25/23 0734 10/26/23 0423  NA 139 139  K 3.4* 3.5  CL 106 108  CO2 19* 20*  GLUCOSE 151* 105*  BUN 62* 52*  CREATININE 1.63* 1.55*  CALCIUM 8.5* 8.0*   GFR Estimated Creatinine Clearance: 29.1 mL/min (A) (by C-G formula based on SCr of 1.55 mg/dL (H)). Liver Function Tests: Recent Labs  Lab 10/25/23 0734 10/26/23 0423  AST 21 16  ALT 12 12  ALKPHOS 47 45  BILITOT 0.5 0.6  PROT 5.9* 5.3*  ALBUMIN 3.2* 3.0*   No results for input(s): "LIPASE", "AMYLASE" in the last 168 hours. No results for input(s): "AMMONIA" in the last 168 hours. Coagulation profile Recent Labs  Lab 10/25/23 0820  INR 2.0*    CBC: Recent Labs  Lab 10/25/23 0734 10/25/23 1840 10/25/23 2054 10/26/23 0131 10/26/23 0832  WBC 10.8*  --   --   --   --   HGB 5.4* 8.1* 8.4* 9.0* 8.9*  HCT 18.1* 24.9* 25.1* 27.2* 27.0*  MCV 91.9  --   --   --   --   PLT 237  --   --   --   --    Cardiac Enzymes: No results for input(s): "CKTOTAL", "CKMB", "CKMBINDEX", "TROPONINI" in the last 168 hours. BNP (last 3 results) No results for input(s): "PROBNP" in the last 8760 hours. CBG: No results for input(s): "GLUCAP" in the last 168 hours. D-Dimer: No results for input(s): "DDIMER" in the last 72 hours. Hgb A1c: No results for  input(s): "HGBA1C" in the last 72 hours. Lipid Profile: No results for input(s): "CHOL", "HDL", "LDLCALC", "TRIG", "CHOLHDL", "LDLDIRECT" in the last 72 hours. Thyroid function studies: No results for input(s): "TSH", "T4TOTAL", "T3FREE", "THYROIDAB" in the last 72 hours.  Invalid input(s): "FREET3" Anemia work up: [employer]  10/25/23 0734 10/25/23 2054  VITAMINB12  --  350  FOLATE 17.4  --   FERRITIN 14*  --   TIBC  --  322  IRON  --  59   Sepsis Labs: Recent Labs  Lab 10/25/23 0734  WBC 10.8*    Microbiology No results found for this or any previous visit (from the past 240 hours).  Procedures and diagnostic studies:  CT ANGIO GI BLEED Result Date: 10/25/2023 CLINICAL DATA:  Weakness, fatigue. EXAM: CTA ABDOMEN AND PELVIS WITHOUT AND WITH CONTRAST TECHNIQUE: Multidetector CT imaging of the abdomen and pelvis was performed using the standard protocol during bolus administration of intravenous contrast. Multiplanar reconstructed images and MIPs were obtained and reviewed to evaluate the vascular anatomy. RADIATION DOSE REDUCTION: This exam was performed according to the departmental dose-optimization program which includes automated exposure control, adjustment of the mA and/or kV according to patient size and/or use of iterative reconstruction technique. CONTRAST:  80mL OMNIPAQUE IOHEXOL 350 MG/ML SOLN COMPARISON:  May 08, 2023. FINDINGS: VASCULAR Aorta: 4.3 cm saccular infrarenal abdominal aortic aneurysm is noted. No dissection is noted. Celiac: Patent without evidence of aneurysm, dissection, vasculitis or significant stenosis. SMA: Severe stenosis is noted at origin secondary to calcified plaque. Renals: Severe stenosis is noted at origin of right renal artery secondary to calcified plaque. Mild stenosis is noted at proximal portion of left renal artery secondary to calcified plaque. IMA: Occluded at origin. Inflow: Grossly patent with patent stent in the proximal right  external iliac artery. Proximal Outflow: Bilateral common femoral and visualized portions of the superficial and profunda femoral arteries are patent without evidence of aneurysm, dissection, vasculitis or significant stenosis. Veins: No obvious venous abnormality within the limitations of this arterial phase study. Review of the MIP images confirms the above findings. NON-VASCULAR Lower chest: No acute abnormality. Hepatobiliary: Minimal cholelithiasis. No biliary dilatation. Liver is unremarkable. Pancreas: Unremarkable. No pancreatic ductal dilatation or surrounding inflammatory changes. Spleen: Normal in size without focal abnormality. Adrenals/Urinary Tract: Adrenal glands appear normal. Right renal atrophy is noted. Chronic right hydronephrosis. No ureteral dilatation is noted. Urinary bladder is unremarkable. Stomach/Bowel: Stomach is within normal limits. Appendix appears normal. No evidence of bowel wall thickening, distention, or inflammatory changes. Sigmoid diverticulosis without inflammation. No definite evidence of contrast extravasation to suggest active gastrointestinal bleeding. Lymphatic: No adenopathy. Reproductive: Mild prostatic enlargement. Other: No ascites or hernia. Musculoskeletal: Multilevel degenerative changes are noted in lumbar spine. No acute osseous abnormality seen. IMPRESSION: VASCULAR No definite evidence of active gastrointestinal bleeding. 4.3 cm infrarenal abdominal aortic aneurysm. Recommend follow-up CT or MR as appropriate in 12 months and referral to or continued care with vascular specialist. (Ref.: J Vasc Surg. 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 Mild stenosis is noted at origin of left renal artery. 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 Aortic Atherosclerosis (ICD10-I70.0). NON-VASCULAR Minimal cholelithiasis. Sigmoid diverticulosis without bleeding. Mild  prostatic enlargement. Chronic right hydronephrosis. Electronically Signed   By: Lupita Raider M.D.   On: 10/25/2023 10:12               LOS: 1 day   Coda Filler  [facility]   Pager on [email or web address]. If 7PM-7AM, please contact night-coverage at [email or web address]     10/26/2023, 1:36 PM

## 2023-10-26 NOTE — Telephone Encounter (Signed)
error 

## 2023-10-27 ENCOUNTER — Encounter
Admission: EM | Disposition: A | Discharge status: Home / Self Care | Payer: Self-pay | Source: Home / Self Care | Attending: Student

## 2023-10-27 ENCOUNTER — Inpatient Hospital Stay (HOSPITAL_COMMUNITY)
Admit: 2023-10-27 | Discharge: 2023-10-27 | Disposition: A | Discharge status: Home / Self Care | Payer: Medicare Other | Attending: Internal Medicine | Admitting: Internal Medicine

## 2023-10-27 ENCOUNTER — Inpatient Hospital Stay: Payer: Medicare Other | Admitting: Anesthesiology

## 2023-10-27 ENCOUNTER — Encounter: Payer: Self-pay | Admitting: Family Medicine

## 2023-10-27 DIAGNOSIS — K921 Melena: Secondary | ICD-10-CM | POA: Diagnosis not present

## 2023-10-27 DIAGNOSIS — I214 Non-ST elevation (NSTEMI) myocardial infarction: Secondary | ICD-10-CM

## 2023-10-27 DIAGNOSIS — K922 Gastrointestinal hemorrhage, unspecified: Secondary | ICD-10-CM | POA: Diagnosis not present

## 2023-10-27 DIAGNOSIS — D500 Iron deficiency anemia secondary to blood loss (chronic): Secondary | ICD-10-CM | POA: Diagnosis not present

## 2023-10-27 HISTORY — PX: ENTEROSCOPY: SHX5533

## 2023-10-27 HISTORY — PX: GIVENS CAPSULE STUDY: SHX5432

## 2023-10-27 HISTORY — PX: SUBMUCOSAL TATTOO INJECTION: SHX6856

## 2023-10-27 LAB — BASIC METABOLIC PANEL
Anion gap: 10 (ref 5–15)
BUN: 36 mg/dL — ABNORMAL HIGH (ref 8–23)
CO2: 22 mmol/L (ref 22–32)
Calcium: 8.4 mg/dL — ABNORMAL LOW (ref 8.9–10.3)
Chloride: 107 mmol/L (ref 98–111)
Creatinine, Ser: 1.27 mg/dL — ABNORMAL HIGH (ref 0.61–1.24)
GFR, Estimated: 55 mL/min — ABNORMAL LOW (ref >60)
Glucose, Bld: 86 mg/dL (ref 70–99)
Potassium: 3.6 mmol/L (ref 3.5–5.1)
Sodium: 139 mmol/L (ref 135–145)

## 2023-10-27 LAB — CBC
HCT: 27.7 % — ABNORMAL LOW (ref 39.0–52.0)
Hemoglobin: 9 g/dL — ABNORMAL LOW (ref 13.0–17.0)
MCH: 29.2 pg (ref 26.0–34.0)
MCHC: 32.5 g/dL (ref 30.0–36.0)
MCV: 89.9 fL (ref 80.0–100.0)
Platelets: 156 10*3/uL (ref 150–400)
RBC: 3.08 MIL/uL — ABNORMAL LOW (ref 4.22–5.81)
RDW: 17.5 % — ABNORMAL HIGH (ref 11.5–15.5)
WBC: 7.5 10*3/uL (ref 4.0–10.5)
nRBC: 0 % (ref 0.0–0.2)

## 2023-10-27 LAB — GLUCOSE, CAPILLARY: Glucose-Capillary: 85 mg/dL (ref 70–99)

## 2023-10-27 SURGERY — ENTEROSCOPY
Anesthesia: General

## 2023-10-27 MED ORDER — PROPOFOL 10 MG/ML IV BOLUS
INTRAVENOUS | Status: DC | PRN
  Administered 2023-10-27: 10 mg via INTRAVENOUS
  Administered 2023-10-27 (×2): 20 mg via INTRAVENOUS

## 2023-10-27 MED ORDER — LIDOCAINE HCL (CARDIAC) PF 100 MG/5ML IV SOSY
PREFILLED_SYRINGE | INTRAVENOUS | Status: DC | PRN
  Administered 2023-10-27: 50 mg via INTRAVENOUS

## 2023-10-27 MED ORDER — PROPOFOL 500 MG/50ML IV EMUL
INTRAVENOUS | Status: DC | PRN
  Administered 2023-10-27: 50 ug/kg/min via INTRAVENOUS

## 2023-10-27 MED ORDER — ASPIRIN 81 MG PO CHEW
81.0000 mg | CHEWABLE_TABLET | Freq: Every day | ORAL | Status: DC
  Administered 2023-10-28: 81 mg via ORAL
  Filled 2023-10-27: qty 1

## 2023-10-27 NOTE — Plan of Care (Signed)
  Problem: Clinical Measurements: Goal: Will remain free from infection Outcome: Progressing   Problem: Clinical Measurements: Goal: Diagnostic test results will improve Outcome: Progressing   Problem: Activity: Goal: Risk for activity intolerance will decrease Outcome: Progressing   Problem: Skin Integrity: Goal: Risk for impaired skin integrity will decrease Outcome: Progressing   Problem: Safety: Goal: Ability to remain free from injury will improve Outcome: Progressing

## 2023-10-27 NOTE — Progress Notes (Signed)
Progress Note    Bryan Welch  GNF:[id number] DOB: 12-Jun-1936  DOA: 10/25/2023 PCP: Gracelyn Nurse, MD      Brief Narrative:    Medical records reviewed and are as summarized below:  Bryan Welch is a 87 y.o. male with medical history significant of Hypertension, Hyperlipidemia, Peripheral Vascular Disease s/p 09/21/2017 RLE revascularization, Chronic Kidney Disease Stage 3B, chronic iron deficiency anemia, anxiety, who presented to the hospital because of general weakness and dark stools.  He had been feeling weak and having dark stools for about 3 to 4 days.  He has a complete end of transient chest pain on arrival to the ED.  Noted to have been admitted July of this year with GI bleeding. Had small intestine GI bleeding on VCE.  She was transition from Eliquis to aspirin and statin in the setting of PVD.  He was evaluated by vascular surgeon in October 2024.  He underwent mechanical thrombectomy of right SFA, popliteal artery, tibioperoneal trunk and peroneal artery on 08/10/2023.  Stent was placed to right SFA with angioplasty of peroneal artery and tibioperoneal trunk.  He was transitioned from aspirin to Xarelto.   He was found to have severe anemia with hemoglobin of 5.4 on admission.    Assessment/Plan:   Principal Problem:   Anemia Active Problems:   Symptomatic anemia   GI bleeding   NSTEMI (non-ST elevated myocardial infarction) (HCC)   Peripheral vascular disease (HCC)   Chronic kidney disease, stage 3b (HCC)   HTN (hypertension)   Acute blood loss anemia   Melena    Body mass index is 20.98 kg/m.   Severe anemia, iron deficiency anemia: Hemoglobin stable.  S/p transfusion with 2 units of PRBCs.  Ferritin level 14 status post 1 dose of IV iron Denies bloody stool. -Continue to monitor H&H  Dark stools with history of GI bleed: Xarelto has been held. S/p small bowel enteroscopy 12/20.  No evidence of AVMs or active bleeding.  Plan for video  capsule endoscopy with GI. -Clear liquid diet today -Continue to hold Xarelto -Okay to resume aspirin 81 mg   Nonischemic myocardial injury due to severe anemia: Peak troponin 688.  2D echo has been ordered for further evaluation.  No indication for anticoagulation at this time. -Follow-up TTE  PVD:  S/p mechanical thrombectomy of right SFA, popliteal artery, tibioperoneal trunk and peroneal artery on 08/10/2023.  Stent was placed to right SFA with angioplasty of peroneal artery and tibioperoneal trunk.  -On Xarelto and aspirin -Holding Xarelto in setting of GI bleed -Will need to discuss with outpatient vascular surgeon if and when to resume Xarelto, given high risk of recurrent bleeds  Hypokalemia: Resolved.  Continue potassium repletion as needed.   CKD stage IIIb: Serum creatinine is improved this a.m. creatinine is stable   General weakness: Likely due to severe anemia.  PT.      Diet Order             Diet clear liquid Fluid consistency: Thin  Diet effective now                            Consultants: Gastroenterologist  Procedures: None    Medications:    pantoprazole  40 mg Oral BID AC   sodium chloride flush  10 mL Intravenous Q12H   sodium chloride flush  10 mL Intravenous Q12H   Continuous Infusions:     Anti-infectives (From  admission, onward)    None              Family Communication/Anticipated D/C date and plan/Code Status   DVT prophylaxis: Place TED hose Start: 10/25/23 1008     Code Status: Full Code  Family Communication: None Disposition Plan: Plan to discharge home   Status is: Inpatient Remains inpatient appropriate because: Workup in progress  Subjective:   Denies having bowel movement over the last 24 hours.  Chest pain shortness of breath.    Objective:    Vitals:   10/27/23 1332 10/27/23 1341 10/27/23 1421 10/27/23 1605  BP:  (!) 159/55 (!) 142/76 (!) 151/56  Pulse:   66 65  Resp:    16   Temp:   98.1 F (36.7 C) 98.5 F (36.9 C)  TempSrc:   Oral Oral  SpO2: 97% 97% 100% 100%  Weight:      Height:       No data found.   Intake/Output Summary (Last 24 hours) at 10/27/2023 1619 Last data filed at 10/27/2023 1422 Gross per 24 hour  Intake 558.6 ml  Output 1275 ml  Net -716.4 ml   Filed Weights   10/25/23 0731  Weight: 59 kg    Exam:  GEN: NAD SKIN: Warm and dry EYES: Warm and dry ENT: MMM CV: RRR PULM: CTA B ABD: soft, ND, NT, +BS CNS: AAO x 3, non focal EXT: No edema or tenderness    Physical Exam  Constitutional: Well-developed, well-nourished, and in no distress.  Eyes: Sclera anicteric Cardiovascular: Normal rate, regular rhythm. No lower extremity edema  Pulmonary: Non labored breathing on room air, no wheezing or rales  Abdominal: Soft. Normal bowel sounds. Non distended and non tender Neurological: Alert and oriented to person, place, and time. Non focal  Skin: Skin is warm and dry.       Data Reviewed:   I have personally reviewed following labs and imaging studies:  Labs: Labs show the following:   Basic Metabolic Panel: Recent Labs  Lab 10/25/23 0734 10/26/23 0423 10/27/23 0500  NA 139 139 139  K 3.4* 3.5 3.6  CL 106 108 107  CO2 19* 20* 22  GLUCOSE 151* 105* 86  BUN 62* 52* 36*  CREATININE 1.63* 1.55* 1.27*  CALCIUM 8.5* 8.0* 8.4*   GFR Estimated Creatinine Clearance: 35.5 mL/min (A) (by C-G formula based on SCr of 1.27 mg/dL (H)). Liver Function Tests: Recent Labs  Lab 10/25/23 0734 10/26/23 0423  AST 21 16  ALT 12 12  ALKPHOS 47 45  BILITOT 0.5 0.6  PROT 5.9* 5.3*  ALBUMIN 3.2* 3.0*   No results for input(s): "LIPASE", "AMYLASE" in the last 168 hours. No results for input(s): "AMMONIA" in the last 168 hours. Coagulation profile Recent Labs  Lab 10/25/23 0820  INR 2.0*    CBC: Recent Labs  Lab 10/25/23 0734 10/25/23 1840 10/25/23 2054 10/26/23 0131 10/26/23 0832 10/27/23 0500  WBC 10.8*   --   --   --   --  7.5  HGB 5.4* 8.1* 8.4* 9.0* 8.9* 9.0*  HCT 18.1* 24.9* 25.1* 27.2* 27.0* 27.7*  MCV 91.9  --   --   --   --  89.9  PLT 237  --   --   --   --  156   Cardiac Enzymes: No results for input(s): "CKTOTAL", "CKMB", "CKMBINDEX", "TROPONINI" in the last 168 hours. BNP (last 3 results) No results for input(s): "PROBNP" in the last 8760 hours. CBG:  Recent Labs  Lab 10/27/23 0747  GLUCAP 85   D-Dimer: No results for input(s): "DDIMER" in the last 72 hours. Hgb A1c: No results for input(s): "HGBA1C" in the last 72 hours. Lipid Profile: No results for input(s): "CHOL", "HDL", "LDLCALC", "TRIG", "CHOLHDL", "LDLDIRECT" in the last 72 hours. Thyroid function studies: No results for input(s): "TSH", "T4TOTAL", "T3FREE", "THYROIDAB" in the last 72 hours.  Invalid input(s): "FREET3" Anemia work up: Recent Labs    10/25/23 0734 10/25/23 2054  VITAMINB12  --  350  FOLATE 17.4  --   FERRITIN 14*  --   TIBC  --  322  IRON  --  59   Sepsis Labs: Recent Labs  Lab 10/25/23 0734 10/27/23 0500  WBC 10.8* 7.5    Microbiology No results found for this or any previous visit (from the past 240 hours).  Procedures and diagnostic studies:  No results found.              LOS: 2 days   Marolyn Haller MD [facility]   Pager on [email or web address]. If 7PM-7AM, please contact night-coverage at [email or web address]     10/27/2023, 4:19 PM

## 2023-10-27 NOTE — Progress Notes (Signed)
Patient underwent small bowel enteroscopy today, there was no evidence of AVMs or active bleeding source in the examined portion of small bowel, likely reached to mid jejunum, tattoo with spot carbon was placed for marking  Placed video capsule endoscopy to evaluate rest of the small bowel today If negative, consider Meckel's scan Okay to resume aspirin 81 mg daily Clear liquid diet today Video capsule to be read by Dr. Timothy Lasso tomorrow Given history of recurrent obscure GI bleeds, patient is at high risk for recurrent bleeds on Xarelto  Lannette Donath, MD

## 2023-10-27 NOTE — Progress Notes (Signed)
Tattoo placed ant the extent of the enteroscopy

## 2023-10-27 NOTE — Anesthesia Preprocedure Evaluation (Signed)
Anesthesia Evaluation  Patient identified by MRN, date of birth, ID band Patient awake    Reviewed: Allergy & Precautions, NPO status , Patient's Chart, lab work & pertinent test results  History of Anesthesia Complications Negative for: history of anesthetic complications  Airway Mallampati: III  TM Distance: >3 FB Neck ROM: full  Mouth opening: Limited Mouth Opening  Dental  (+) Upper Dentures, Lower Dentures   Pulmonary neg pulmonary ROS, former smoker   Pulmonary exam normal        Cardiovascular hypertension, On Medications + CAD, + Past MI and + Peripheral Vascular Disease  Normal cardiovascular exam     Neuro/Psych negative neurological ROS  negative psych ROS   GI/Hepatic negative GI ROS, Neg liver ROS,,,  Endo/Other  negative endocrine ROS    Renal/GU Renal disease  negative genitourinary   Musculoskeletal   Abdominal   Peds  Hematology  (+) Blood dyscrasia, anemia   Anesthesia Other Findings Past Medical History: No date: Arthritis     Comment:  Toes No date: Coronary artery disease No date: History of hiatal hernia No date: Hyperlipidemia No date: Hypertension No date: Kidney disorder     Comment:  "right kidney doesn't work" No date: Presence of stent in artery     Comment:  "4 in legs, 1 in neck" No date: Vascular abnormality No date: Wears dentures     Comment:  full upper and lower  Past Surgical History: 09/16/2021: AMPUTATION TOE; Right     Comment:  Procedure: AMPUTATION TOE;  Surgeon: Rosetta Posner, DPM;              Location: [facility];  Service: Podiatry;                Laterality: Right; No date: BACK SURGERY No date: CAROTID STENT; Right 06/17/2019: CATARACT EXTRACTION W/PHACO; Left     Comment:  Procedure: CATARACT EXTRACTION PHACO AND INTRAOCULAR               LENS PLACEMENT (IOC) LEFT;  Surgeon: Nevada Crane,               MD;  Location: [facility];   Service:               Ophthalmology;  Laterality: Left; 07/08/2019: CATARACT EXTRACTION W/PHACO; Right     Comment:  Procedure: CATARACT EXTRACTION PHACO AND INTRAOCULAR               LENS PLACEMENT (IOC) right  00:39.2  10.8%  5.02  ;                Surgeon: Nevada Crane, MD;  Location: [facility]               SUR[facility]ERY [facility];  Service: Ophthalmology;  Laterality:               Right; 04/13/2023: COLONOSCOPY WITH PROPOFOL; N/A     Comment:  Procedure: COLONOSCOPY WITH PROPOFOL;  Surgeon: Jaynie Collins, DO;  Location: [facility] ENDOSCOPY;  Service:               Gastroenterology;  Laterality: N/A; 05/10/2023: ENTEROSCOPY; N/A     Comment:  Procedure: ENTEROSCOPY;  Surgeon: Midge Minium, MD;                Location: [facility] ENDOSCOPY;  Service: Endoscopy;  Laterality: N/A; 04/13/2023: ESOPHAGOGASTRODUODENOSCOPY (EGD) WITH PROPOFOL; N/A     Comment:  Procedure: ESOPHAGOGASTRODUODENOSCOPY (EGD) WITH               PROPOFOL;  Surgeon: Jaynie Collins, DO;  Location:              [facility] ENDOSCOPY;  Service: Gastroenterology;  Laterality:               N/A; No date: EYE SURGERY 05/09/2023: GIVENS CAPSULE STUDY; N/A     Comment:  Procedure: GIVENS CAPSULE STUDY;  Surgeon: Midge Minium,              MD;  Location: [facility] ENDOSCOPY;  Service: Endoscopy;                Laterality: N/A; 05/10/2023: HOT HEMOSTASIS     Comment:  Procedure: HOT HEMOSTASIS (ARGON PLASMA               COAGULATION/BICAP);  Surgeon: Midge Minium, MD;                Location: [facility] ENDOSCOPY;  Service: Endoscopy;; No date: LEG SURGERY; Bilateral     Comment:  stent placement 08/19/2021: LOWER EXTREMITY ANGIOGRAPHY; Right     Comment:  Procedure: LOWER EXTREMITY ANGIOGRAPHY;  Surgeon: Annice Needy, MD;  Location: [facility] INVASIVE CV LAB;  Service:               Cardiovascular;  Laterality: Right; 08/09/2023: LOWER EXTREMITY ANGIOGRAPHY; Right     Comment:  Procedure: Lower Extremity  Angiography;  Surgeon: Annice Needy, MD;  Location: [facility] INVASIVE CV LAB;  Service:               Cardiovascular;  Laterality: Right; 08/10/2023: LOWER EXTREMITY ANGIOGRAPHY; Right     Comment:  Procedure: Lower Extremity Angiography;  Surgeon: Annice Needy, MD;  Location: [facility] INVASIVE CV LAB;  Service:               Cardiovascular;  Laterality: Right; 09/21/2017: PERIPHERAL VASCULAR BALLOON ANGIOPLASTY; Right     Comment:  Procedure: PERIPHERAL VASCULAR BALLOON ANGIOPLASTY;                Surgeon: Annice Needy, MD;  Location: [facility] INVASIVE CV               LAB;  Service: Cardiovascular;  Laterality: Right; 11/26/2019: REVERSE SHOULDER ARTHROPLASTY; Right     Comment:  Procedure: REVERSE SHOULDER ARTHROPLASTY;  Surgeon:               Christena Flake, MD;  Location: [facility] ORS;  Service:               Orthopedics;  Laterality: Right;  BMI    Body Mass Index: 20.98 kg/m      Reproductive/Obstetrics negative OB ROS                             Anesthesia Physical Anesthesia Plan  ASA: 3  Anesthesia Plan: General   Post-op Pain Management: Minimal or no pain anticipated   Induction: Intravenous  PONV Risk Score and Plan: 1 and Propofol infusion and TIVA  Airway Management Planned: Natural  Airway and Nasal Cannula  Additional Equipment:   Intra-op Plan:   Post-operative Plan:   Informed Consent: I have reviewed the patients History and Physical, chart, labs and discussed the procedure including the risks, benefits and alternatives for the proposed anesthesia with the patient or authorized representative who has indicated his/her understanding and acceptance.     Dental Advisory Given  Plan Discussed with: Anesthesiologist, CRNA and Surgeon  Anesthesia Plan Comments: (Patient consented for risks of anesthesia including but not limited to:  - adverse reactions to medications - risk of airway placement if required - damage  to eyes, teeth, lips or other oral mucosa - nerve damage due to positioning  - sore throat or hoarseness - Damage to heart, brain, nerves, lungs, other parts of body or loss of life  Patient voiced understanding and assent.)        Anesthesia Quick Evaluation

## 2023-10-27 NOTE — Transfer of Care (Signed)
Immediate Anesthesia Transfer of Care Note  [name]  Procedure(s) Performed: ENTEROSCOPY SUBMUCOSAL TATTOO INJECTION  Patient Location: PACU  Anesthesia Type:General  Level of Consciousness: sedated  Airway & Oxygen Therapy: Patient Spontanous Breathing  Post-op Assessment: Report given to RN and Post -op Vital signs reviewed and stable  Post vital signs: Reviewed and stable  Last Vitals:  Vitals Value Taken Time  BP    Temp    Pulse 77 10/27/23 1321  Resp 20 10/27/23 1321  SpO2 97 % 10/27/23 1321  Vitals shown include unfiled device data.  Last Pain:  Vitals:   10/27/23 1125  TempSrc: Oral  PainSc:          Complications: No notable events documented.

## 2023-10-27 NOTE — Progress Notes (Signed)
Capsule endoscopy was initiated without complications. Image was visualized on the recorder.

## 2023-10-27 NOTE — Anesthesia Postprocedure Evaluation (Signed)
Anesthesia Post Note  [name]  Procedure(s) Performed: ENTEROSCOPY SUBMUCOSAL TATTOO INJECTION GIVENS CAPSULE STUDY  Patient location during evaluation: PACU Anesthesia Type: General Level of consciousness: awake and alert Pain management: pain level controlled Vital Signs Assessment: post-procedure vital signs reviewed and stable Respiratory status: spontaneous breathing, nonlabored ventilation and respiratory function stable Cardiovascular status: blood pressure returned to baseline and stable Postop Assessment: no apparent nausea or vomiting Anesthetic complications: no   No notable events documented.   Last Vitals:  Vitals:   10/27/23 1421 10/27/23 1605  BP: (!) 142/76 (!) 151/56  Pulse: 66 65  Resp:  16  Temp: 36.7 C 36.9 C  SpO2: 100% 100%    Last Pain:  Vitals:   10/27/23 1605  TempSrc: Oral  PainSc:                  [name]

## 2023-10-27 NOTE — Op Note (Signed)
[facility] Gastroenter[facility] Patient Name: Bryan Welch Procedure Date: 10/27/2023 12:44 PM MRN: [id number] Account #: [id number] Date of Birth: 05-31-1936 Admit Type: Inpatient Age: 87 Room: [facility] ENDO ROOM 3 Gender: Male Note Status: Finalized Instrument Name: Peds Colonoscope [id number] Procedure:             Small bowel enteroscopy Indications:           Iron deficiency anemia secondary to chronic blood                         loss, Melena, Obscure gastrointestinal bleeding Providers:             Toney Reil MD, MD Referring MD:          Gracelyn Nurse, MD (Referring MD) Medicines:             General Anesthesia Complications:         No immediate complications. Estimated blood loss: None. Procedure:             Pre-Anesthesia Assessment:                        - Prior to the procedure, a History and Physical was                         performed, and patient medications and allergies were                         reviewed. The patient is competent. The risks and                         benefits of the procedure and the sedation options and                         risks were discussed with the patient. All questions                         were answered and informed consent was obtained.                         Patient identification and proposed procedure were                         verified by the physician, the nurse, the                         anesthesiologist, the anesthetist and the technician                         in the pre-procedure area in the procedure room in the                         endoscopy suite. Mental Status Examination: alert and                         oriented. Airway Examination: normal oropharyngeal                         airway and neck mobility. Respiratory Examination:  clear to auscultation. CV Examination: normal.                         Prophylactic Antibiotics: The patient does not require                          prophylactic antibiotics. Prior Anticoagulants: The                         patient has taken Xarelto (rivaroxaban), last dose was                         3 days prior to procedure. ASA Grade Assessment: III -                         A patient with severe systemic disease. After                         reviewing the risks and benefits, the patient was                         deemed in satisfactory condition to undergo the                         procedure. The anesthesia plan was to use general                         anesthesia. Immediately prior to administration of                         medications, the patient was re-assessed for adequacy                         to receive sedatives. The heart rate, respiratory                         rate, oxygen saturations, blood pressure, adequacy of                         pulmonary ventilation, and response to care were                         monitored throughout the procedure. The physical                         status of the patient was re-assessed after the                         procedure.                        After obtaining informed consent, the endoscope was                         passed under direct vision. Throughout the procedure,                         the patient's blood pressure, pulse, and oxygen  saturations were monitored continuously. The                         Colonoscope was introduced through the mouth and                         advanced to the mid-jejunum. The small bowel                         enteroscopy was accomplished without difficulty. The                         patient tolerated the procedure well. Findings:      Normal mucosa was found in the jejunum. Area was tattooed with an       injection of Spot (carbon black).      There was no evidence of significant pathology in the entire examined       duodenum.      The esophagus was normal.      The stomach was  normal. Impression:            - Normal mucosa was found in the jejunum. Tattooed.                        - Normal examined duodenum.                        - Normal esophagus.                        - Normal stomach.                        - No specimens collected. Recommendation:        - Return patient to hospital ward for ongoing care.                        - To visualize the small bowel, perform video capsule                         endoscopy today.                        - Full liquid diet today, then advance as tolerated to                         resume previous diet. Procedure Code(s):     --- Professional ---                        [id number], Small intestinal endoscopy, enteroscopy beyond                         second portion of duodenum, not including ileum;                         diagnostic, including collection of specimen(s) by                         brushing or washing, when performed (separate  procedure)                        [phone], Unlisted procedure, small intestine Diagnosis Code(s):     --- Professional ---                        D50.0, Iron deficiency anemia secondary to blood loss                         (chronic)                        K92.1, Melena (includes Hematochezia)                        K92.2, Gastrointestinal hemorrhage, unspecified CPT copyright 2022 American Medical Association. All rights reserved. The codes documented in this report are preliminary and upon coder review may  be revised to meet current compliance requirements. Dr. Libby Maw Toney Reil MD, MD 10/27/2023 1:19:07 PM This report has been signed electronically. Number of Addenda: 0 Note Initiated On: 10/27/2023 12:44 PM Estimated Blood Loss:  Estimated blood loss: none.      [facility]

## 2023-10-27 NOTE — Plan of Care (Signed)
  Problem: Clinical Measurements: Goal: Ability to maintain clinical measurements within normal limits will improve Outcome: Progressing   Problem: Safety: Goal: Ability to remain free from injury will improve Outcome: Progressing   Problem: Pain Management: Goal: General experience of comfort will improve Outcome: Progressing

## 2023-10-28 LAB — TYPE AND SCREEN
ABO/RH(D): O POS
Antibody Screen: NEGATIVE
Unit division: 0
Unit division: 0
Unit division: 0
Unit division: 0

## 2023-10-28 LAB — BASIC METABOLIC PANEL
Anion gap: 7 (ref 5–15)
BUN: 31 mg/dL — ABNORMAL HIGH (ref 8–23)
CO2: 22 mmol/L (ref 22–32)
Calcium: 8.2 mg/dL — ABNORMAL LOW (ref 8.9–10.3)
Chloride: 106 mmol/L (ref 98–111)
Creatinine, Ser: 1.22 mg/dL (ref 0.61–1.24)
GFR, Estimated: 58 mL/min — ABNORMAL LOW (ref >60)
Glucose, Bld: 126 mg/dL — ABNORMAL HIGH (ref 70–99)
Potassium: 3.9 mmol/L (ref 3.5–5.1)
Sodium: 135 mmol/L (ref 135–145)

## 2023-10-28 LAB — BPAM RBC
Blood Product Expiration Date: 202501082359
Blood Product Expiration Date: 202501132359
Blood Product Expiration Date: 202501132359
Blood Product Expiration Date: 202501132359
ISSUE DATE / TIME: 202412180948
ISSUE DATE / TIME: 202412181156
ISSUE DATE / TIME: 202412181545
Unit Type and Rh: 5100
Unit Type and Rh: 5100
Unit Type and Rh: 5100
Unit Type and Rh: 5100

## 2023-10-28 LAB — CBC
HCT: 28.4 % — ABNORMAL LOW (ref 39.0–52.0)
Hemoglobin: 9.4 g/dL — ABNORMAL LOW (ref 13.0–17.0)
MCH: 29.4 pg (ref 26.0–34.0)
MCHC: 33.1 g/dL (ref 30.0–36.0)
MCV: 88.8 fL (ref 80.0–100.0)
Platelets: 187 10*3/uL (ref 150–400)
RBC: 3.2 MIL/uL — ABNORMAL LOW (ref 4.22–5.81)
RDW: 17.2 % — ABNORMAL HIGH (ref 11.5–15.5)
WBC: 12.4 10*3/uL — ABNORMAL HIGH (ref 4.0–10.5)
nRBC: 0 % (ref 0.0–0.2)

## 2023-10-28 LAB — PREPARE RBC (CROSSMATCH)

## 2023-10-28 LAB — MAGNESIUM: Magnesium: 1.5 mg/dL — ABNORMAL LOW (ref 1.7–2.4)

## 2023-10-28 LAB — PHOSPHORUS: Phosphorus: 4.3 mg/dL (ref 2.5–4.6)

## 2023-10-28 MED ORDER — MAGNESIUM SULFATE 2 GM/50ML IV SOLN
2.0000 g | Freq: Once | INTRAVENOUS | Status: AC
  Administered 2023-10-28: 2 g via INTRAVENOUS
  Filled 2023-10-28: qty 50

## 2023-10-28 NOTE — Progress Notes (Addendum)
Inpatient Follow-up/Progress Note   Patient ID: Bryan Welch is a 87 y.o. male.  Overnight Events / Subjective Findings No bowel movements overnight.  Hemoglobin is stable.  Patient underwent video capsule endoscopy overnight which was reviewed by me today..  Patient did take off his recording device during the night at 1 point which led to some lapse in colon data, but overall an adequate study No abdominal pain.  Tolerating p.o. Underwent enteroscopy yesterday which was unremarkable  Review of Systems  Constitutional:  Negative for activity change, appetite change, chills, diaphoresis, fatigue, fever and unexpected weight change.  HENT:  Negative for trouble swallowing and voice change.   Respiratory:  Negative for shortness of breath and wheezing.   Cardiovascular:  Negative for chest pain, palpitations and leg swelling.  Gastrointestinal:  Negative for abdominal distention, abdominal pain, anal bleeding, blood in stool, constipation, diarrhea, nausea and vomiting.  Musculoskeletal:  Negative for arthralgias and myalgias.  Skin:  Negative for color change and pallor.  Neurological:  Negative for dizziness, syncope and weakness.  Psychiatric/Behavioral:  Negative for confusion. The patient is not nervous/anxious.   All other systems reviewed and are negative.    Medications  Current Facility-Administered Medications:    aspirin chewable tablet 81 mg, 81 mg, Oral, Daily, Marolyn Haller, MD, 81 mg at 10/28/23 0854   calcium carbonate (TUMS - dosed in mg elemental calcium) chewable tablet 400 mg of elemental calcium, 400 mg of elemental calcium, Oral, TID PRN, Lurene Shadow, MD, 400 mg of elemental calcium at 10/26/23 1707   ondansetron (ZOFRAN) tablet 4 mg, 4 mg, Oral, Q6H PRN **OR** ondansetron (ZOFRAN) injection 4 mg, 4 mg, Intravenous, Q6H PRN, Floydene Flock, MD   pantoprazole (PROTONIX) EC tablet 40 mg, 40 mg, Oral, BID AC, Vanga, Loel Dubonnet, MD, 40 mg at 10/28/23  0855   sodium chloride flush (NS) 0.9 % injection 10 mL, 10 mL, Intravenous, Q12H, Mumma, Shannon, MD, 10 mL at 10/26/23 2355   sodium chloride flush (NS) 0.9 % injection 10 mL, 10 mL, Intravenous, Q12H, Floydene Flock, MD, 10 mL at 10/28/23 1038   calcium carbonate, ondansetron **OR** ondansetron (ZOFRAN) IV   Objective    Vitals:   10/27/23 1955 10/27/23 2340 10/28/23 0342 10/28/23 0943  BP: (!) 145/50 (!) 171/63 (!) 138/46 (!) 144/49  Pulse: 73 74 75 71  Resp: 19 19 17 18   Temp: 97.8 F (36.6 C) 98 F (36.7 C) 98.9 F (37.2 C) 98.5 F (36.9 C)  TempSrc: Oral Oral Oral   SpO2: 99% 98% 96% 99%  Weight:      Height:         Physical Exam Vitals and nursing note reviewed.  Constitutional:      General: He is not in acute distress.    Appearance: He is not ill-appearing, toxic-appearing or diaphoretic.  HENT:     Head: Normocephalic and atraumatic.     Nose: Nose normal.     Mouth/Throat:     Mouth: Mucous membranes are moist.     Pharynx: Oropharynx is clear.  Eyes:     General: No scleral icterus.    Extraocular Movements: Extraocular movements intact.  Cardiovascular:     Rate and Rhythm: Normal rate and regular rhythm.     Heart sounds: Normal heart sounds. No murmur heard.    No friction rub. No gallop.  Pulmonary:     Effort: Pulmonary effort is normal. No respiratory distress.     Breath sounds:  Normal breath sounds. No wheezing, rhonchi or rales.  Abdominal:     General: Bowel sounds are normal. There is no distension.     Palpations: Abdomen is soft.     Tenderness: There is no abdominal tenderness. There is no guarding or rebound.  Musculoskeletal:     Cervical back: Neck supple.     Right lower leg: No edema.     Left lower leg: No edema.  Skin:    General: Skin is warm and dry.     Coloration: Skin is not jaundiced or pale.  Neurological:     General: No focal deficit present.     Mental Status: He is alert and oriented to person, place, and  time. Mental status is at baseline.  Psychiatric:        Mood and Affect: Mood normal.        Behavior: Behavior normal.        Thought Content: Thought content normal.        Judgment: Judgment normal.      Laboratory Data Recent Labs  Lab 10/25/23 0734 10/25/23 1840 10/26/23 0832 10/27/23 0500 10/28/23 0336  WBC 10.8*  --   --  7.5 12.4*  HGB 5.4*   < > 8.9* 9.0* 9.4*  HCT 18.1*   < > 27.0* 27.7* 28.4*  PLT 237  --   --  156 187   < > = values in this interval not displayed.   Recent Labs  Lab 10/25/23 0734 10/26/23 0423 10/27/23 0500  NA 139 139 139  K 3.4* 3.5 3.6  CL 106 108 107  CO2 19* 20* 22  BUN 62* 52* 36*  CREATININE 1.63* 1.55* 1.27*  CALCIUM 8.5* 8.0* 8.4*  PROT 5.9* 5.3*  --   BILITOT 0.5 0.6  --   ALKPHOS 47 45  --   ALT 12 12  --   AST 21 16  --   GLUCOSE 151* 105* 86   Recent Labs  Lab 10/25/23 0820  INR 2.0*      Imaging Studies: No results found.  Assessment:   # Acute on chronic anemia secondary GI bleeding -Melena on presentation -Hemoglobin currently stable at 9.4 off of Xarelto -Initially presented on 12/18 with 5.4 -Underwent enteroscopy on 12/20 which was unremarkable -tattoo placed had extensive enteroscopy - negative cta on 12/18 - chronic iron deficiency likely 2/2 colon polyps  # Colon polyp- TVA - likely source of melena is the larger colon polyp in his cecum near the ICV. This was meant to be addressed with advanced endoscopy this summer, however, patient missed appt due to COVID infection and was not rescheduled  # Duodenal avms - appreciated 05/2023- but not on today's VCE or yesterday enteroscopy  # CKD # HTN, HLD, PAD s/p revascularization, carotid stenosis  Plan:  Video capsule reviewed by me today demonstrating melena in the terminal ileum and cecum which is consistent with his known large polyp near the ileocecal valve which is likely the source. He underwent colonoscopy that identified this and was  referred to advance endoscopy for potential removal.  Unfortunately he contracted COVID and was not able to attend his appointment and was not rescheduled. Recommend he follow-up with advanced endoscopy for resection of this lesion as he may have continued bleeding from there until it is addressed. Discussed with primary team.  Consideration for discontinuation of Xarelto given his at least second GI bleed, at least until this lesion is removed.  Defer to primary  team and vascular surgery team to decide.  Continue daily protonix Resume diet as tolerated  Recommend resumption of his IV iron infusions Avoid aspirin and NSAIDs (ibuprofen, Motrin, Advil, naproxen, Aleve, Naprosyn, Goody's powder, & BC powder).  I spoke with the patient's son and daughter-in-law for 15 minutes over the phone regarding evaluation and plan  Management of other medical comorbidities as per primary team  No further planned intervention at this time GI to sign off. Available as needed. Please do not hesitate to call regarding questions or concerns.  I personally performed the service.  Thank you for allowing [place] to participate in this patient's care.   Jaynie Collins, DO [facility]astro[facility]  Portions of the record may have been created with voice recognition software. Occasional wrong-word or 'sound-a-like' substitutions may have occurred due to the inherent limitations of voice recognition software.  Read the chart carefully and recognize, using context, where substitutions may have occurred.

## 2023-10-28 NOTE — Progress Notes (Signed)
AVS given and reviewed with patient and his son. PIV removed. All questions answered

## 2023-10-28 NOTE — Discharge Summary (Signed)
Physician Discharge Summary   [name] MRN: [id number] DOB: 1936/10/22  Admit date:     10/25/2023  Discharge date: 10/28/23  Discharge Physician: Marolyn Haller   PCP: Gracelyn Nurse, MD   Recommendations at discharge:   Please follow-up with vascular surgery, Dr. Wyn Quaker, regarding when/if to resume Xarelto Please call [facility] to reschedule appointment for a cecal polyp Please schedule appointment with primary care physician, have magnesium and potassium checked at that time  Discharge Diagnoses: Principal Problem:   Anemia Active Problems:   Symptomatic anemia   GI bleeding   NSTEMI (non-ST elevated myocardial infarction) (HCC)   Peripheral vascular disease (HCC)   Chronic kidney disease, stage 3b (HCC)   HTN (hypertension)   Acute blood loss anemia   Melena  Resolved Problems:   * No resolved hospital problems. [facility] Course: No notes on file  Assessment and Plan: Severe anemia, iron deficiency GI bleed Patient's hemoglobin 5.4 and symptomatic on admission.  Ferritin level noted to be 14 as well.  Patient received 2 units of packed red blood cells and 1 dose of IV iron.  He noted improvement in his fatigue.  He also noted dark/maroon-colored stools.  He underwent evaluation with GI this hospitalization and via video capsule endoscopy was noted to have bleeding cecal polyp which was discovered during previous admission, 05/2023.  GI recommended that patient reschedule appointment with [facility] for removal of this polyp. -Patient did not have a bowel movement on the day of discharge but the day prior, but his hemoglobin remained stable. -Patient Xarelto will continue to be held in the setting of his recent GI bleed.  Discussed this with patient's outpatient vascular surgeon who noted that he will discuss when/if to resume this sometime next week. -Patient will discharge on aspirin only.   NSTEMI versus acute nonischemic myocardial injury in the  setting of anemia   Troponin peaked at 688 on admission w/ transient chest pain. EKG sinus tachycardia, no evidence of acute ischemia. -No further workup was performed. -TTE performed this hospitalization, formal read pending   Peripheral vascular disease (HCC) Baseline hx/o peripheral Vascular Disease s/p 09/21/2017 RLE revascularization Previously hospitalized for GI bleed on Xarelto 05/2023.   08/2023 Had mechanical thrombectomy of the right SFA, popliteal artery, tibioperoneal trunk and peroneal artery. He also had a stent placed to the right SFA with angioplasty of the peroneal artery and tibioperoneal trunk- transitioned back to xarelto  -Will continue to hold patient's Xarelto and discharge him on aspirin only.  He will call for follow-up with his outpatient vascular surgeon 12/23   Chronic kidney disease, stage 3b (HCC)    Latest Ref Rng & Units 10/28/2023   12:51 PM 10/27/2023    5:00 AM 10/26/2023    4:23 AM  BMP  Glucose 70 - 99 mg/dL 130  86  865   BUN 8 - 23 mg/dL 31  36  52   Creatinine 0.61 - 1.24 mg/dL 7.84  6.96  2.95   Sodium 135 - 145 mmol/L 135  139  139   Potassium 3.5 - 5.1 mmol/L 3.9  3.6  3.5   Chloride 98 - 111 mmol/L 106  107  108   CO2 22 - 32 mmol/L 22  22  20    Calcium 8.9 - 10.3 mg/dL 8.2  8.4  8.0    Mostly at baseline.  Improved on discharge  HTN (hypertension) Resume patient's home hydrochlorothiazide on discharge.  Pain control - [employer] Controlled Substance Reporting System database was reviewed. and patient was instructed, not to drive, operate heavy machinery, perform activities at heights, swimming or participation in water activities or provide baby-sitting services while on Pain, Sleep and Anxiety Medications; until their outpatient Physician has advised to do so again. Also recommended to not to take more than prescribed Pain, Sleep and Anxiety Medications.  Consultants:  Gastroenterology Procedures performed:  Small  bowel enteroscopy, video capsule endoscopy Disposition: Home Diet recommendation:  Regular diet DISCHARGE MEDICATION: Allergies as of 10/28/2023       Reactions   Codeine Itching        Medication List     PAUSE taking these medications    Xarelto 20 MG Tabs tablet Wait to take this until your doctor or other care provider tells you to start again. Generic drug: rivaroxaban Take 20 mg by mouth daily.       STOP taking these medications    diphenhydrAMINE 25 mg capsule Commonly known as: BENADRYL   traMADol 50 MG tablet Commonly known as: ULTRAM       TAKE these medications    acetaminophen 500 MG tablet Commonly known as: TYLENOL Take 1,000 mg by mouth at bedtime.   ALPRAZolam 0.25 MG tablet Commonly known as: XANAX Take 0.25 mg by mouth at bedtime.   aspirin EC 81 MG tablet Take 81 mg daily by mouth.   Cholecalciferol 25 MCG (1000 UT) capsule Take 2,000 Units by mouth daily.   gabapentin 300 MG capsule Commonly known as: NEURONTIN Take 300 mg by mouth 2 (two) times daily.   hydrochlorothiazide 25 MG tablet Commonly known as: HYDRODIURIL Take 1 tablet by mouth daily. What changed: Another medication with the same name was removed. Continue taking this medication, and follow the directions you see here.   omeprazole 20 MG capsule Commonly known as: PRILOSEC Take 20 mg by mouth at bedtime.   potassium chloride 10 MEQ tablet Commonly known as: KLOR-CON Take 10 mEq by mouth daily.   pravastatin 20 MG tablet Commonly known as: PRAVACHOL Take 20 mg by mouth at bedtime.   PRESERVISION AREDS 2 PO Take 1 tablet by mouth 2 (two) times daily.   tamsulosin 0.4 MG Caps capsule Commonly known as: FLOMAX Take 0.4 mg by mouth daily.        Discharge Exam: Filed Weights   10/25/23 0731  Weight: 59 kg   Physical Exam  Constitutional: Well-developed, well-nourished, and in no distress.  Neck: Normal range of motion.  Cardiovascular: Normal  rate, regular rhythm, intact distal pulses. No lower extremity edema  Pulmonary: Non labored breathing on room air, no wheezing or rales  Abdominal: Soft. Normal bowel sounds. Non distended and non tender Musculoskeletal: Normal range of motion.     Neurological: Alert and oriented to person, place, and time. Non focal  Skin: Skin is warm and dry.    Condition at discharge: good  The results of significant diagnostics from this hospitalization (including imaging, microbiology, ancillary and laboratory) are listed below for reference.   Imaging Studies: CT ANGIO GI BLEED Result Date: 10/25/2023 CLINICAL DATA:  Weakness, fatigue. EXAM: CTA ABDOMEN AND PELVIS WITHOUT AND WITH CONTRAST TECHNIQUE: Multidetector CT imaging of the abdomen and pelvis was performed using the standard protocol during bolus administration of intravenous contrast. Multiplanar reconstructed images and MIPs were obtained and reviewed to evaluate the vascular anatomy. RADIATION DOSE REDUCTION: This exam was performed according to the departmental dose-optimization program which includes automated  exposure control, adjustment of the mA and/or kV according to patient size and/or use of iterative reconstruction technique. CONTRAST:  80mL OMNIPAQUE IOHEXOL 350 MG/ML SOLN COMPARISON:  May 08, 2023. FINDINGS: VASCULAR Aorta: 4.3 cm saccular infrarenal abdominal aortic aneurysm is noted. No dissection is noted. Celiac: Patent without evidence of aneurysm, dissection, vasculitis or significant stenosis. SMA: Severe stenosis is noted at origin secondary to calcified plaque. Renals: Severe stenosis is noted at origin of right renal artery secondary to calcified plaque. Mild stenosis is noted at proximal portion of left renal artery secondary to calcified plaque. IMA: Occluded at origin. Inflow: Grossly patent with patent stent in the proximal right external iliac artery. Proximal Outflow: Bilateral common femoral and visualized portions of  the superficial and profunda femoral arteries are patent without evidence of aneurysm, dissection, vasculitis or significant stenosis. Veins: No obvious venous abnormality within the limitations of this arterial phase study. Review of the MIP images confirms the above findings. NON-VASCULAR Lower chest: No acute abnormality. Hepatobiliary: Minimal cholelithiasis. No biliary dilatation. Liver is unremarkable. Pancreas: Unremarkable. No pancreatic ductal dilatation or surrounding inflammatory changes. Spleen: Normal in size without focal abnormality. Adrenals/Urinary Tract: Adrenal glands appear normal. Right renal atrophy is noted. Chronic right hydronephrosis. No ureteral dilatation is noted. Urinary bladder is unremarkable. Stomach/Bowel: Stomach is within normal limits. Appendix appears normal. No evidence of bowel wall thickening, distention, or inflammatory changes. Sigmoid diverticulosis without inflammation. No definite evidence of contrast extravasation to suggest active gastrointestinal bleeding. Lymphatic: No adenopathy. Reproductive: Mild prostatic enlargement. Other: No ascites or hernia. Musculoskeletal: Multilevel degenerative changes are noted in lumbar spine. No acute osseous abnormality seen. IMPRESSION: VASCULAR No definite evidence of active gastrointestinal bleeding. 4.3 cm infrarenal abdominal aortic aneurysm. Recommend follow-up CT or MR as appropriate in 12 months and referral to or continued care with vascular specialist. (Ref.: J Vasc Surg. 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 Mild stenosis is noted at origin of left renal artery. 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 Aortic Atherosclerosis (ICD10-I70.0). NON-VASCULAR Minimal cholelithiasis. Sigmoid diverticulosis without bleeding. Mild prostatic enlargement. Chronic right hydronephrosis. Electronically Signed   By: Lupita Raider M.D.   On: 10/25/2023 10:12    Microbiology: Results for orders placed or performed during the hospital encounter of 08/09/23  MRSA Next Gen by PCR, Nasal     Status: None   Collection Time: 08/09/23 11:16 AM   Specimen: Nasal Mucosa; Nasal Swab  Result Value Ref Range Status   MRSA by PCR Next Gen NOT DETECTED NOT DETECTED Final    Comment: (NOTE) The GeneXpert MRSA Assay (FDA approved for NASAL specimens only), is one component of a comprehensive MRSA colonization surveillance program. It is not intended to diagnose MRSA infection nor to guide or monitor treatment for MRSA infections. Test performance is not FDA approved in patients less than 64 years old. Performed at [facility], [address] [place], [place] [address]     Labs: CBC: Recent Labs  Lab 10/25/23 [phone] 10/25/23 1840 10/25/23 2054 10/26/23 0131 10/26/23 0832 10/27/23 0500 10/28/23 0336  WBC 10.8*  --   --   --   --  7.5 12.4*  HGB 5.4*   < > 8.4* 9.0* 8.9* 9.0* 9.4*  HCT 18.1*   < > 25.1* 27.2* 27.0* 27.7* 28.4*  MCV 91.9  --   --   --   --  89.9 88.8  PLT 237  --   --   --   --  156 187   < > = values in this interval not displayed.   Basic Metabolic Panel: Recent Labs  Lab 10/25/23 0734 10/26/23 0423 10/27/23 0500 10/28/23 0336 10/28/23 1251  NA 139 139 139  --  135  K 3.4* 3.5 3.6  --  3.9  CL 106 108 107  --  106  CO2 19* 20* 22  --  22  GLUCOSE 151* 105* 86  --  126*  BUN 62* 52* 36*  --  31*  CREATININE 1.63* 1.55* 1.27*  --  1.22  CALCIUM 8.5* 8.0* 8.4*  --  8.2*  MG  --   --   --  1.5*  --   PHOS  --   --   --  4.3  --    Liver Function Tests: Recent Labs  Lab 10/25/23 0734 10/26/23 0423  AST 21 16  ALT 12 12  ALKPHOS 47 45  BILITOT 0.5 0.6  PROT 5.9* 5.3*  ALBUMIN 3.2* 3.0*   CBG: Recent Labs  Lab 10/27/23 0747  GLUCAP 85    Discharge time spent: greater than 30 minutes.  Signed: Marolyn Haller, MD [facility] 10/28/2023

## 2023-10-28 NOTE — Progress Notes (Signed)
Physical Therapy Treatment Patient Details Name: Bryan Welch MRN: [id number] DOB: 09/14/1936 Today's Date: 10/28/2023   History of Present Illness Pt is an 88 y/o M admitted on 10/25/23 after presenting with worsening weakness. Pt found to have Hgb of 5.4 & is being treated for GI bleed & NSTEMI. PMH: HTN, HLD, PVD, CKD 3B, chronic iron deficiency anemia, anxiety    PT Comments  Awaiting DC this PM.  Agrees to session.  OOB and walks 110' with no AD and generally unsteady gait but no LOB or interventions needed.  He is encouraged to use RW initially upon DC for general safety.  Stated he as one available at home.     If plan is discharge home, recommend the following: Help with stairs or ramp for entrance   Can travel by private vehicle        Equipment Recommendations  None recommended by PT    Recommendations for Other Services       Precautions / Restrictions Precautions Precautions: Fall Restrictions Weight Bearing Restrictions Per Provider Order: No     Mobility  Bed Mobility                 Patient Response: Cooperative  Transfers Overall transfer level: Independent                      Ambulation/Gait Ambulation/Gait assistance: Supervision, Contact guard assist Gait Distance (Feet): 110 Feet Assistive device: None Gait Pattern/deviations: Step-through pattern, Decreased step length - right, Decreased step length - left, Decreased dorsiflexion - right, Decreased dorsiflexion - left Gait velocity: decreased     General Gait Details: short shuffling steps.  a bit unsteady but no LOB or intervention needed, limited by fatigue   Stairs             Wheelchair Mobility     Tilt Bed Tilt Bed Patient Response: Cooperative  Modified Rankin (Stroke Patients Only)       Balance Overall balance assessment: Needs assistance Sitting-balance support: Feet supported Sitting balance-Leahy Scale: Good     Standing balance support:  No upper extremity supported Standing balance-Leahy Scale: Fair                              Cognition Arousal: Alert Behavior During Therapy: WFL for tasks assessed/performed Overall Cognitive Status: Within Functional Limits for tasks assessed                                          Exercises      General Comments        Pertinent Vitals/Pain Pain Assessment Pain Assessment: No/denies pain    Home Living                          Prior Function            PT Goals (current goals can now be found in the care plan section) Progress towards PT goals: Progressing toward goals    Frequency    Min 1X/week      PT Plan      Co-evaluation              AM-PAC PT "6 Clicks" Mobility   Outcome Measure  Help needed turning from your back to your side while  in a flat bed without using bedrails?: None Help needed moving from lying on your back to sitting on the side of a flat bed without using bedrails?: None Help needed moving to and from a bed to a chair (including a wheelchair)?: None Help needed standing up from a chair using your arms (e.g., wheelchair or bedside chair)?: None Help needed to walk in hospital room?: A Little Help needed climbing 3-5 steps with a railing? : A Little 6 Click Score: 22    End of Session Equipment Utilized During Treatment: Gait belt Activity Tolerance: Patient tolerated treatment well Patient left: in bed;with call bell/phone within reach;with bed alarm set Nurse Communication: Mobility status PT Visit Diagnosis: Unsteadiness on feet (R26.81);Muscle weakness (generalized) (M62.81)     Time: 8295-6213 PT Time Calculation (min) (ACUTE ONLY): 8 min  Charges:    $Gait Training: 8-22 mins PT General Charges $$ ACUTE PT VISIT: 1 Visit                   Danielle Dess, PTA 10/28/23, 2:46 PM

## 2023-10-28 NOTE — Progress Notes (Signed)
Upon assessment, monitor for capsule study was off patient. Patient stated that he "just took it off right before you came in" and he took it off because it was uncomfortable. This RN placed the monitor back on the patient and reminded patient to leave it on. Patient called at 2200 and requested to take off monitor because he can't go to sleep with it due to being uncomfortable.

## 2023-10-29 LAB — ECHOCARDIOGRAM COMPLETE
AR max vel: 2.03 cm2
AV Area VTI: 2.18 cm2
AV Area mean vel: 2.17 cm2
AV Mean grad: 7 mm[Hg]
AV Peak grad: 14 mm[Hg]
Ao pk vel: 1.87 m/s
Area-P 1/2: 2.77 cm2
Height: 66 in
MV VTI: 2.02 cm2
S' Lateral: 2.4 cm
Weight: 2080 [oz_av]

## 2023-10-31 ENCOUNTER — Encounter (INDEPENDENT_AMBULATORY_CARE_PROVIDER_SITE_OTHER): Payer: Self-pay | Admitting: Vascular Surgery

## 2023-10-31 ENCOUNTER — Ambulatory Visit (INDEPENDENT_AMBULATORY_CARE_PROVIDER_SITE_OTHER): Payer: Medicare Other | Admitting: Vascular Surgery

## 2023-10-31 VITALS — BP 108/57 | HR 80 | Resp 16 | Wt 134.6 lb

## 2023-10-31 DIAGNOSIS — I7143 Infrarenal abdominal aortic aneurysm, without rupture: Secondary | ICD-10-CM

## 2023-10-31 DIAGNOSIS — I6523 Occlusion and stenosis of bilateral carotid arteries: Secondary | ICD-10-CM

## 2023-10-31 DIAGNOSIS — K922 Gastrointestinal hemorrhage, unspecified: Secondary | ICD-10-CM

## 2023-10-31 DIAGNOSIS — I10 Essential (primary) hypertension: Secondary | ICD-10-CM | POA: Diagnosis not present

## 2023-10-31 DIAGNOSIS — K31811 Angiodysplasia of stomach and duodenum with bleeding: Secondary | ICD-10-CM

## 2023-10-31 DIAGNOSIS — I7025 Atherosclerosis of native arteries of other extremities with ulceration: Secondary | ICD-10-CM

## 2023-10-31 DIAGNOSIS — E785 Hyperlipidemia, unspecified: Secondary | ICD-10-CM

## 2023-10-31 MED ORDER — RIVAROXABAN 2.5 MG PO TABS: 2.5000 mg | ORAL_TABLET | Freq: Two times a day (BID) | ORAL | 3 refills | Status: DC

## 2023-10-31 NOTE — Progress Notes (Signed)
MRN : [id number]  Bryan Welch is a 87 y.o. (07/21/1936) male who presents with chief complaint of  Chief Complaint  Patient presents with   Follow-up    Discuss anticoagulant   .  History of Present Illness: Patient returns today in follow up prior to his scheduled visit due to a recent hospitalization with GI bleeding.  He had to have his Xarelto stopped.  We have started the Xarelto after an ischemic limb with extensive revascularization few months ago.  His right leg is done well.  His wound is healed and he is out of rest pain.  Current Outpatient Medications  Medication Sig Dispense Refill   acetaminophen (TYLENOL) 500 MG tablet Take 1,000 mg by mouth at bedtime.     ALPRAZolam (XANAX) 0.25 MG tablet Take 0.25 mg by mouth at bedtime.     aspirin EC 81 MG tablet Take 81 mg daily by mouth.      Cholecalciferol 25 MCG (1000 UT) capsule Take 2,000 Units by mouth daily.     gabapentin (NEURONTIN) 300 MG capsule Take 300 mg by mouth 2 (two) times daily.     hydrochlorothiazide (HYDRODIURIL) 25 MG tablet Take 1 tablet by mouth daily.     Multiple Vitamins-Minerals (PRESERVISION AREDS 2 PO) Take 1 tablet by mouth 2 (two) times daily.     omeprazole (PRILOSEC) 20 MG capsule Take 20 mg by mouth at bedtime.     potassium chloride (KLOR-CON) 10 MEQ tablet Take 10 mEq by mouth daily.     pravastatin (PRAVACHOL) 20 MG tablet Take 20 mg by mouth at bedtime.     rivaroxaban (XARELTO) 2.5 MG TABS tablet Take 1 tablet (2.5 mg total) by mouth 2 (two) times daily. 60 tablet 3   tamsulosin (FLOMAX) 0.4 MG CAPS capsule Take 0.4 mg by mouth daily.  3   No current facility-administered medications for this visit.    Past Medical History:  Diagnosis Date   Arthritis    Toes   Coronary artery disease    History of hiatal hernia    Hyperlipidemia    Hypertension    Kidney disorder    "right kidney doesn't work"   Presence of stent in artery    "4 in legs, 1 in neck"   Vascular  abnormality    Wears dentures    full upper and lower    Past Surgical History:  Procedure Laterality Date   AMPUTATION TOE Right 09/16/2021   Procedure: AMPUTATION TOE;  Surgeon: Rosetta Posner, DPM;  Location: [facility];  Service: Podiatry;  Laterality: Right;   BACK SURGERY     CAROTID STENT Right    CATARACT EXTRACTION W/PHACO Left 06/17/2019   Procedure: CATARACT EXTRACTION PHACO AND INTRAOCULAR LENS PLACEMENT (IOC) LEFT;  Surgeon: Nevada Crane, MD;  Location: [facility];  Service: Ophthalmology;  Laterality: Left;   CATARACT EXTRACTION W/PHACO Right 07/08/2019   Procedure: CATARACT EXTRACTION PHACO AND INTRAOCULAR LENS PLACEMENT (IOC) right  00:39.2  10.8%  5.02  ;  Surgeon: Nevada Crane, MD;  Location: [facility] [place] [facility];  Service: Ophthalmology;  Laterality: Right;   COLONOSCOPY WITH PROPOFOL N/A 04/13/2023   Procedure: COLONOSCOPY WITH PROPOFOL;  Surgeon: Jaynie Collins, DO;  Location: [facility] ENDOSCOPY;  Service: Gastroenterology;  Laterality: N/A;   ENTEROSCOPY N/A 05/10/2023   Procedure: ENTEROSCOPY;  Surgeon: Midge Minium, MD;  Location: [facility] ENDOSCOPY;  Service: Endoscopy;  Laterality: N/A;   ENTEROSCOPY N/A 10/27/2023   Procedure: ENTEROSCOPY;  Surgeon: Toney Reil, MD;  Location: [facility] ENDOSCOPY;  Service: Gastroenterology;  Laterality: N/A;   ESOPHAGOGASTRODUODENOSCOPY (EGD) WITH PROPOFOL N/A 04/13/2023   Procedure: ESOPHAGOGASTRODUODENOSCOPY (EGD) WITH PROPOFOL;  Surgeon: Jaynie Collins, DO;  Location: [facility] ENDOSCOPY;  Service: Gastroenterology;  Laterality: N/A;   EYE SURGERY     GIVENS CAPSULE STUDY N/A 05/09/2023   Procedure: GIVENS CAPSULE STUDY;  Surgeon: Midge Minium, MD;  Location: [facility] ENDOSCOPY;  Service: Endoscopy;  Laterality: N/A;   GIVENS CAPSULE STUDY  10/27/2023   Procedure: GIVENS CAPSULE STUDY;  Surgeon: Toney Reil, MD;  Location: [facility] ENDOSCOPY;  Service: Gastroenterology;;   HOT HEMOSTASIS  05/10/2023    Procedure: HOT HEMOSTASIS (ARGON PLASMA COAGULATION/BICAP);  Surgeon: Midge Minium, MD;  Location: [facility] ENDOSCOPY;  Service: Endoscopy;;   LEG SURGERY Bilateral    stent placement   LOWER EXTREMITY ANGIOGRAPHY Right 08/19/2021   Procedure: LOWER EXTREMITY ANGIOGRAPHY;  Surgeon: Annice Needy, MD;  Location: [facility] INVASIVE CV LAB;  Service: Cardiovascular;  Laterality: Right;   LOWER EXTREMITY ANGIOGRAPHY Right 08/09/2023   Procedure: Lower Extremity Angiography;  Surgeon: Annice Needy, MD;  Location: [facility] INVASIVE CV LAB;  Service: Cardiovascular;  Laterality: Right;   LOWER EXTREMITY ANGIOGRAPHY Right 08/10/2023   Procedure: Lower Extremity Angiography;  Surgeon: Annice Needy, MD;  Location: [facility] INVASIVE CV LAB;  Service: Cardiovascular;  Laterality: Right;   PERIPHERAL VASCULAR BALLOON ANGIOPLASTY Right 09/21/2017   Procedure: PERIPHERAL VASCULAR BALLOON ANGIOPLASTY;  Surgeon: Annice Needy, MD;  Location: [facility] INVASIVE CV LAB;  Service: Cardiovascular;  Laterality: Right;   REVERSE SHOULDER ARTHROPLASTY Right 11/26/2019   Procedure: REVERSE SHOULDER ARTHROPLASTY;  Surgeon: Christena Flake, MD;  Location: [facility] ORS;  Service: Orthopedics;  Laterality: Right;   SUBMUCOSAL TATTOO INJECTION  10/27/2023   Procedure: SUBMUCOSAL TATTOO INJECTION;  Surgeon: Toney Reil, MD;  Location: [facility] ENDOSCOPY;  Service: Gastroenterology;;     Social History   Tobacco Use   Smoking status: Former    Current packs/day: 0.00    Types: Cigarettes    Quit date: 2001    Years since quitting: 23.9   Smokeless tobacco: Never  Vaping Use   Vaping status: Never Used  Substance Use Topics   Alcohol use: Not Currently   Drug use: No      Family History  Problem Relation Age of Onset   Bladder Cancer Mother    Prostate cancer Father    Prostate cancer Brother    Diabetes Maternal Grandfather      Allergies  Allergen Reactions   Codeine Itching     REVIEW OF SYSTEMS (Negative unless checked)    Constitutional: [] Weight loss  [] Fever  [] Chills Cardiac: [] Chest pain   [] Chest pressure   [] Palpitations   [] Shortness of breath when laying flat   [] Shortness of breath at rest   [x] Shortness of breath with exertion. Vascular:  [x] Pain in legs with walking   [] Pain in legs at rest   [] Pain in legs when laying flat   [x] Claudication   [] Pain in feet when walking  [] Pain in feet at rest  [] Pain in feet when laying flat   [] History of DVT   [] Phlebitis   [] Swelling in legs   [] Varicose veins   [x] Non-healing ulcers Pulmonary:   [] Uses home oxygen   [] Productive cough   [] Hemoptysis   [] Wheeze  [] COPD   [] Asthma Neurologic:  [] Dizziness  [] Blackouts   [] Seizures   [] History of stroke   [] History of TIA  [] Aphasia   []   Temporary blindness   [] Dysphagia   [] Weakness or numbness in arms   [] Weakness or numbness in legs Musculoskeletal:  [x] Arthritis   [] Joint swelling   [x] Joint pain   [] Low back pain Hematologic:  [] Easy bruising  [] Easy bleeding   [] Hypercoagulable state   [] Anemic   Gastrointestinal:  [] Blood in stool   [] Vomiting blood  [] Gastroesophageal reflux/heartburn   [] Abdominal pain Genitourinary:  [x] Chronic kidney disease   [] Difficult urination  [] Frequent urination  [] Burning with urination   [] Hematuria Skin:  [] Rashes   [x] Ulcers   [x] Wounds Psychological:  [] History of anxiety   []  History of major depression.  Physical Examination  BP (!) 108/57   Pulse 80   Resp 16   Wt 134 lb 9.6 oz (61.1 kg)   BMI 21.73 kg/m  Gen:  WD/WN, NAD. Appears younger than stated age. Head: [place]/AT, No temporalis wasting. Ear/Nose/Throat: Hearing grossly intact, nares w/o erythema or drainage Eyes: Conjunctiva clear. Sclera non-icteric Neck: Supple.  Trachea midline Pulmonary:  Good air movement, no use of accessory muscles.  Cardiac: RRR, no JVD Vascular:  Vessel Right Left  Radial Palpable Palpable                          PT 1+ Palpable 1+ Palpable  DP 1+ Palpable 2+ Palpable    Gastrointestinal: soft, non-tender/non-distended. No guarding/reflex.  Musculoskeletal: M/S 5/5 throughout.  No deformity or atrophy. No edema. Neurologic: Sensation grossly intact in extremities.  Symmetrical.  Speech is fluent.  Psychiatric: Judgment intact, Mood & affect appropriate for pt's clinical situation. Dermatologic: No rashes or ulcers noted.  No cellulitis or open wounds.      Labs Recent Results (from the past 2160 hours)  VAS [place] ABI WITH/WO TBI     Status: None   Collection Time: 08/04/23  8:53 AM  Result Value Ref Range   Right ABI 0.37    Left ABI 0.70   BUN     Status: Abnormal   Collection Time: 08/09/23  7:33 AM  Result Value Ref Range   BUN 29 (H) 8 - 23 mg/dL    Comment: Performed at [facility], [address] [place], [place] [address]  Creatinine, serum     Status: Abnormal   Collection Time: 08/09/23  7:33 AM  Result Value Ref Range   Creatinine, Ser 1.45 (H) 0.61 - 1.24 mg/dL   GFR, Estimated 48 (L) >60 mL/min    Comment: (NOTE) Calculated using the CKD-EPI Creatinine Equation (2021) Performed at [facility], [address] [place], [place] [address]   Glucose, capillary     Status: Abnormal   Collection Time: 08/09/23 11:04 AM  Result Value Ref Range   Glucose-Capillary 106 (H) 70 - 99 mg/dL    Comment: Glucose reference range applies only to samples taken after fasting for at least 8 hours.  MRSA Next Gen by PCR, Nasal     Status: None   Collection Time: 08/09/23 11:16 AM   Specimen: Nasal Mucosa; Nasal Swab  Result Value Ref Range   MRSA by PCR Next Gen NOT DETECTED NOT DETECTED    Comment: (NOTE) The GeneXpert MRSA Assay (FDA approved for NASAL specimens only), is one component of a comprehensive MRSA colonization surveillance program. It is not intended to diagnose MRSA infection nor to guide or monitor treatment for MRSA infections. Test performance is not FDA approved in patients less than 108  years old. Performed at [facility],  [address], [place], [place] [address]   Heparin level (unfractionated) every 6 hours x 4 post-procedure     Status: Abnormal   Collection Time: 08/09/23 11:20 AM  Result Value Ref Range   Heparin Unfractionated >1.10 (H) 0.30 - 0.70 IU/mL    Comment: (NOTE) The clinical reportable range upper limit is being lowered to >1.10 to align with the FDA approved guidance for the current laboratory assay.  If heparin results are below expected values, and patient dosage has  been confirmed, suggest follow up testing of antithrombin III levels. Performed at [facility], [address] [place], [place] [address]   CBC every 6 hours x 4 post-procedure     Status: Abnormal   Collection Time: 08/09/23 11:20 AM  Result Value Ref Range   WBC 7.5 4.0 - 10.5 K/uL   RBC 4.53 4.22 - 5.81 MIL/uL   Hemoglobin 12.6 (L) 13.0 - 17.0 g/dL   HCT 09.8 (L) 11.9 - 14.7 %   MCV 84.3 80.0 - 100.0 fL   MCH 27.8 26.0 - 34.0 pg   MCHC 33.0 30.0 - 36.0 g/dL   RDW 82.9 56.2 - 13.0 %   Platelets 202 150 - 400 K/uL   nRBC 0.0 0.0 - 0.2 %    Comment: Performed at [facility], [address] [place], [place] [address]  Fibrinogen every 6 hours x 4 post-procedure     Status: None   Collection Time: 08/09/23 11:20 AM  Result Value Ref Range   Fibrinogen 474 210 - 475 mg/dL    Comment: (NOTE) Fibrinogen results may be underestimated in patients receiving thrombolytic therapy. Performed at [facility], [address] [place], [place] [address]   Heparin level (unfractionated) every 6 hours x 4 post-procedure     Status: None   Collection Time: 08/09/23  5:04 PM  Result Value Ref Range   Heparin Unfractionated 0.60 0.30 - 0.70 IU/mL    Comment: (NOTE) The clinical reportable range upper limit is being lowered to >1.10 to align with the FDA approved guidance for the current laboratory assay.  If heparin results are below  expected values, and patient dosage has  been confirmed, suggest follow up testing of antithrombin III levels. Performed at [facility], [address] [place], [place] [address]   CBC every 6 hours x 4 post-procedure     Status: Abnormal   Collection Time: 08/09/23  5:04 PM  Result Value Ref Range   WBC 8.2 4.0 - 10.5 K/uL   RBC 4.16 (L) 4.22 - 5.81 MIL/uL   Hemoglobin 11.7 (L) 13.0 - 17.0 g/dL   HCT 13.2 (L) 44.0 - 10.2 %   MCV 83.9 80.0 - 100.0 fL   MCH 28.1 26.0 - 34.0 pg   MCHC 33.5 30.0 - 36.0 g/dL   RDW 72.5 (H) 36.6 - 44.0 %   Platelets 180 150 - 400 K/uL   nRBC 0.0 0.0 - 0.2 %    Comment: Performed at [facility], [address] [place], [place] [address]  Fibrinogen every 6 hours x 4 post-procedure     Status: None   Collection Time: 08/09/23  5:04 PM  Result Value Ref Range   Fibrinogen 438 210 - 475 mg/dL    Comment: (NOTE) Fibrinogen results may be underestimated in patients receiving thrombolytic therapy. Performed at [facility], [address] [place], [place] [address]   Heparin level (unfractionated) every 6 hours x 4 post-procedure  Status: None   Collection Time: 08/09/23 10:39 PM  Result Value Ref Range   Heparin Unfractionated 0.53 0.30 - 0.70 IU/mL    Comment: (NOTE) The clinical reportable range upper limit is being lowered to >1.10 to align with the FDA approved guidance for the current laboratory assay.  If heparin results are below expected values, and patient dosage has  been confirmed, suggest follow up testing of antithrombin III levels. Performed at [facility], [address] [place], [place] [address]   CBC every 6 hours x 4 post-procedure     Status: Abnormal   Collection Time: 08/09/23 10:39 PM  Result Value Ref Range   WBC 9.9 4.0 - 10.5 K/uL   RBC 4.39 4.22 - 5.81 MIL/uL   Hemoglobin 12.4 (L) 13.0 - 17.0 g/dL   HCT 64.4 (L) 03.4 - 74.2 %   MCV 86.6 80.0 - 100.0 fL   MCH 28.2 26.0  - 34.0 pg   MCHC 32.6 30.0 - 36.0 g/dL   RDW 59.5 63.8 - 75.6 %   Platelets 175 150 - 400 K/uL   nRBC 0.0 0.0 - 0.2 %    Comment: Performed at [facility], [address] [place], [place] [address]  Fibrinogen every 6 hours x 4 post-procedure     Status: None   Collection Time: 08/09/23 10:39 PM  Result Value Ref Range   Fibrinogen 388 210 - 475 mg/dL    Comment: (NOTE) Fibrinogen results may be underestimated in patients receiving thrombolytic therapy. Performed at [facility], [address] [place], [place] [address]   Heparin level (unfractionated) every 6 hours x 4 post-procedure     Status: None   Collection Time: 08/10/23  4:42 AM  Result Value Ref Range   Heparin Unfractionated 0.39 0.30 - 0.70 IU/mL    Comment: (NOTE) The clinical reportable range upper limit is being lowered to >1.10 to align with the FDA approved guidance for the current laboratory assay.  If heparin results are below expected values, and patient dosage has  been confirmed, suggest follow up testing of antithrombin III levels. Performed at [facility], [address] [place], [place] [address]   CBC every 6 hours x 4 post-procedure     Status: Abnormal   Collection Time: 08/10/23  4:42 AM  Result Value Ref Range   WBC 9.0 4.0 - 10.5 K/uL   RBC 4.30 4.22 - 5.81 MIL/uL   Hemoglobin 12.0 (L) 13.0 - 17.0 g/dL   HCT 30.1 (L) 60.1 - 09.3 %   MCV 83.0 80.0 - 100.0 fL   MCH 27.9 26.0 - 34.0 pg   MCHC 33.6 30.0 - 36.0 g/dL   RDW 23.5 57.3 - 22.0 %   Platelets 156 150 - 400 K/uL   nRBC 0.0 0.0 - 0.2 %    Comment: Performed at [facility], [address] [place], [place] [address]  Fibrinogen every 6 hours x 4 post-procedure     Status: None   Collection Time: 08/10/23  4:42 AM  Result Value Ref Range   Fibrinogen 390 210 - 475 mg/dL    Comment: (NOTE) Fibrinogen results may be underestimated in patients receiving thrombolytic therapy. Performed at  [facility], [address] [place], [place] [address]   CBC with Differential/Platelet     Status: Abnormal   Collection Time: 08/11/23  6:23 AM  Result Value Ref Range   WBC 10.8 (H) 4.0 - 10.5 K/uL   RBC  4.16 (L) 4.22 - 5.81 MIL/uL   Hemoglobin 11.7 (L) 13.0 - 17.0 g/dL   HCT 46.9 (L) 62.9 - 52.8 %   MCV 85.3 80.0 - 100.0 fL   MCH 28.1 26.0 - 34.0 pg   MCHC 33.0 30.0 - 36.0 g/dL   RDW 41.3 24.4 - 01.0 %   Platelets 130 (L) 150 - 400 K/uL   nRBC 0.0 0.0 - 0.2 %   Neutrophils Relative % 79 %   Neutro Abs 8.7 (H) 1.7 - 7.7 K/uL   Lymphocytes Relative 7 %   Lymphs Abs 0.8 0.7 - 4.0 K/uL   Monocytes Relative 10 %   Monocytes Absolute 1.0 0.1 - 1.0 K/uL   Eosinophils Relative 2 %   Eosinophils Absolute 0.2 0.0 - 0.5 K/uL   Basophils Relative 1 %   Basophils Absolute 0.1 0.0 - 0.1 K/uL   Immature Granulocytes 1 %   Abs Immature Granulocytes 0.05 0.00 - 0.07 K/uL    Comment: Performed at [facility], [address], [place], [place] [address]  Basic metabolic panel     Status: Abnormal   Collection Time: 08/11/23  6:23 AM  Result Value Ref Range   Sodium 136 135 - 145 mmol/L   Potassium 3.9 3.5 - 5.1 mmol/L   Chloride 103 98 - 111 mmol/L   CO2 25 22 - 32 mmol/L   Glucose, Bld 103 (H) 70 - 99 mg/dL    Comment: Glucose reference range applies only to samples taken after fasting for at least 8 hours.   BUN 18 8 - 23 mg/dL   Creatinine, Ser 6.64 0.61 - 1.24 mg/dL   Calcium 8.4 (L) 8.9 - 10.3 mg/dL   GFR, Estimated >40 >34 mL/min    Comment: (NOTE) Calculated using the CKD-EPI Creatinine Equation (2021)    Anion gap 8 5 - 15    Comment: Performed at [facility], [address] [place], [place] [address]  VAS [place] ABI WITH/WO TBI     Status: None   Collection Time: 08/30/23 10:49 AM  Result Value Ref Range   Right ABI 0.59    Left ABI 0.66   Basic metabolic panel     Status: Abnormal   Collection Time: 10/25/23  7:34 AM  Result Value Ref  Range   Sodium 139 135 - 145 mmol/L   Potassium 3.4 (L) 3.5 - 5.1 mmol/L   Chloride 106 98 - 111 mmol/L   CO2 19 (L) 22 - 32 mmol/L   Glucose, Bld 151 (H) 70 - 99 mg/dL    Comment: Glucose reference range applies only to samples taken after fasting for at least 8 hours.   BUN 62 (H) 8 - 23 mg/dL   Creatinine, Ser 5.63 (H) 0.61 - 1.24 mg/dL   Calcium 8.5 (L) 8.9 - 10.3 mg/dL   GFR, Estimated 41 (L) >60 mL/min    Comment: (NOTE) Calculated using the CKD-EPI Creatinine Equation (2021)    Anion gap 14 5 - 15    Comment: Performed at [facility], [address] [place], [place] [address]  CBC     Status: Abnormal   Collection Time: 10/25/23  7:34 AM  Result Value Ref Range   WBC 10.8 (H) 4.0 - 10.5 K/uL   RBC 1.97 (L) 4.22 - 5.81 MIL/uL   Hemoglobin 5.4 (L) 13.0 - 17.0 g/dL   HCT 33.2 (L) 95.1 - 88.4 %   MCV 91.9 80.0 - 100.0 fL   MCH  27.4 26.0 - 34.0 pg   MCHC 29.8 (L) 30.0 - 36.0 g/dL   RDW 16.1 (H) 09.6 - 04.5 %   Platelets 237 150 - 400 K/uL   nRBC 0.0 0.0 - 0.2 %    Comment: Performed at [facility], [address] [place], [place] [address]  Troponin I (High Sensitivity)     Status: Abnormal   Collection Time: 10/25/23  7:34 AM  Result Value Ref Range   Troponin I (High Sensitivity) 194 (HH) <18 ng/L    Comment: CRITICAL RESULT CALLED TO, READ BACK BY AND VERIFIED WITH TAYLOR MASSEY 10/25/23 @ 0816 BY SH (NOTE) Elevated high sensitivity troponin I (hsTnI) values and significant  changes across serial measurements may suggest ACS but many other  chronic and acute conditions are known to elevate hsTnI results.  Refer to the "Links" section for chest pain algorithms and additional  guidance. Performed at [facility], [address] [place], [place] [address]   Hepatic function panel     Status: Abnormal   Collection Time: 10/25/23  7:34 AM  Result Value Ref Range   Total Protein 5.9 (L) 6.5 - 8.1 g/dL   Albumin 3.2 (L) 3.5 - 5.0 g/dL    AST 21 15 - 41 U/L   ALT 12 0 - 44 U/L   Alkaline Phosphatase 47 38 - 126 U/L   Total Bilirubin 0.5 <1.2 mg/dL   Bilirubin, Direct <8.2 0.0 - 0.2 mg/dL   Indirect Bilirubin NOT CALCULATED 0.3 - 0.9 mg/dL    Comment: Performed at [facility], [address] [place], [place] [address]  Ferritin     Status: Abnormal   Collection Time: 10/25/23  7:34 AM  Result Value Ref Range   Ferritin 14 (L) 24 - 336 ng/mL    Comment: Performed at [facility], [address] [place], [place] [address]  Folate     Status: None   Collection Time: 10/25/23  7:34 AM  Result Value Ref Range   Folate 17.4 >5.9 ng/mL    Comment: Performed at [facility], [address] [place], [place] [address]  Type and screen [facility]     Status: None   Collection Time: 10/25/23  8:09 AM  Result Value Ref Range   ABO/RH(D) O POS    Antibody Screen NEG    Sample Expiration 10/28/2023,2359    Unit Number [id number]    Blood Component Type RED CELLS,LR    Unit division 00    Status of Unit ISSUED,FINAL    Transfusion Status OK TO TRANSFUSE    Crossmatch Result Compatible    Unit Number [id number]    Blood Component Type RED CELLS,LR    Unit division 00    Status of Unit ISSUED,FINAL    Transfusion Status OK TO TRANSFUSE    Crossmatch Result Compatible    Unit Number [id number]    Blood Component Type RED CELLS,LR    Unit division 00    Status of Unit ISSUED,FINAL    Transfusion Status OK TO TRANSFUSE    Crossmatch Result Compatible    Unit Number [id number]    Blood Component Type RED CELLS,LR    Unit division 00    Status of Unit REL FROM [facility]    Transfusion Status OK TO TRANSFUSE    Crossmatch Result      Compatible Performed at [facility], [address] [place], [place] [address]   BPAM RBC  Status: None   Collection Time: 10/25/23  8:09 AM  Result Value Ref Range   ISSUE DATE / TIME [id number]    Blood  Product Unit Number [id number]    PRODUCT CODE [id number]    Unit Type and Rh 5100    Blood Product Expiration Date 130865784696    ISSUE DATE / TIME 295284132440    Blood Product Unit Number [id number]    PRODUCT CODE [id number]    Unit Type and Rh 5100    Blood Product Expiration Date 387564332951    ISSUE DATE / TIME 884166063016    Blood Product Unit Number [id number]    PRODUCT CODE [id number]    Unit Type and Rh 5100    Blood Product Expiration Date 237628315176    Blood Product Unit Number [id number]    PRODUCT CODE [id number]    Unit Type and Rh 5100    Blood Product Expiration Date 500938182993   Prepare RBC (crossmatch)     Status: None   Collection Time: 10/25/23  8:13 AM  Result Value Ref Range   Order Confirmation      ORDER PROCESSED BY BLOOD BANK Performed at [facility], [address] [place], [place] [address]   Protime-INR     Status: Abnormal   Collection Time: 10/25/23  8:20 AM  Result Value Ref Range   Prothrombin Time 23.2 (H) 11.4 - 15.2 seconds   INR 2.0 (H) 0.8 - 1.2    Comment: (NOTE) INR goal varies based on device and disease states. Performed at [facility], [address] [place], [place] [address]   APTT     Status: None   Collection Time: 10/25/23  8:20 AM  Result Value Ref Range   aPTT 32 24 - 36 seconds    Comment: Performed at [facility], [address] [place], [place] [address]  Urinalysis, Routine w reflex microscopic -Urine, Clean Catch     Status: Abnormal   Collection Time: 10/25/23 11:17 AM  Result Value Ref Range   Color, Urine STRAW (A) YELLOW   APPearance CLEAR (A) CLEAR   Specific Gravity, Urine 1.028 1.005 - 1.030   pH 5.0 5.0 - 8.0   Glucose, UA NEGATIVE NEGATIVE mg/dL   Hgb urine dipstick NEGATIVE NEGATIVE   Bilirubin Urine NEGATIVE NEGATIVE   Ketones, ur NEGATIVE NEGATIVE mg/dL   Protein, ur NEGATIVE NEGATIVE mg/dL   Nitrite NEGATIVE NEGATIVE   Leukocytes,Ua NEGATIVE  NEGATIVE    Comment: Performed at [facility], [address] [place], [place] [address]  Prepare RBC (crossmatch)     Status: None   Collection Time: 10/25/23 12:00 PM  Result Value Ref Range   Order Confirmation      DUPLICATE REQUEST Performed at [facility], [address] [place], [place] [address]   Prepare RBC (crossmatch)     Status: None   Collection Time: 10/25/23  4:15 PM  Result Value Ref Range   Order Confirmation      ORDER PROCESSED BY BLOOD BANK Performed at [facility], [address] [place], [place] [address]   Hemoglobin and hematocrit, blood     Status: Abnormal   Collection Time: 10/25/23  6:40 PM  Result Value Ref Range   Hemoglobin 8.1 (L) 13.0 - 17.0 g/dL    Comment: REPEATED TO VERIFY   HCT 24.9 (L) 39.0 - 52.0 %    Comment: Performed at [facility], [address] [place], [place] [address]  Troponin I (High Sensitivity)     Status: Abnormal   Collection Time: 10/25/23  8:54 PM  Result Value Ref Range   Troponin I (High Sensitivity) 688 (HH) <18 ng/L    Comment: CRITICAL VALUE NOTED. VALUE IS CONSISTENT WITH PREVIOUSLY REPORTED/CALLED VALUE SKL (NOTE) Elevated high sensitivity troponin I (hsTnI) values and significant  changes across serial measurements may suggest ACS but many other  chronic and acute conditions are known to elevate hsTnI results.  Refer to the "Links" section for chest pain algorithms and additional  guidance. Performed at [facility], [address] [place], [place] [address]   Hemoglobin and hematocrit, blood     Status: Abnormal   Collection Time: 10/25/23  8:54 PM  Result Value Ref Range   Hemoglobin 8.4 (L) 13.0 - 17.0 g/dL   HCT 40.3 (L) 47.4 - 25.9 %    Comment: Performed at [facility], [address] [place], [place] [address]  Iron and TIBC     Status: None   Collection Time: 10/25/23  8:54 PM  Result Value Ref Range   Iron 59 45 - 182 ug/dL    TIBC 564 332 - 951 ug/dL   Saturation Ratios 18 17.9 - 39.5 %   UIBC 263 ug/dL    Comment: Performed at [facility], [address] [place], [place] [address]  Vitamin B12     Status: None   Collection Time: 10/25/23  8:54 PM  Result Value Ref Range   Vitamin B-12 350 180 - 914 pg/mL    Comment: (NOTE) This assay is not validated for testing neonatal or myeloproliferative syndrome specimens for Vitamin B12 levels. Performed at [facility], [address], [place], [place] [address]   Hemoglobin and hematocrit, blood     Status: Abnormal   Collection Time: 10/26/23  1:31 AM  Result Value Ref Range   Hemoglobin 9.0 (L) 13.0 - 17.0 g/dL   HCT 16.0 (L) 10.9 - 32.3 %    Comment: Performed at [facility], [address] [place], [place] [address]  Comprehensive metabolic panel     Status: Abnormal   Collection Time: 10/26/23  4:23 AM  Result Value Ref Range   Sodium 139 135 - 145 mmol/L   Potassium 3.5 3.5 - 5.1 mmol/L   Chloride 108 98 - 111 mmol/L   CO2 20 (L) 22 - 32 mmol/L   Glucose, Bld 105 (H) 70 - 99 mg/dL    Comment: Glucose reference range applies only to samples taken after fasting for at least 8 hours.   BUN 52 (H) 8 - 23 mg/dL   Creatinine, Ser 2.02 (H) 0.61 - 1.24 mg/dL   Calcium 8.0 (L) 8.9 - 10.3 mg/dL   Total Protein 5.3 (L) 6.5 - 8.1 g/dL   Albumin 3.0 (L) 3.5 - 5.0 g/dL   AST 16 15 - 41 U/L   ALT 12 0 - 44 U/L   Alkaline Phosphatase 45 38 - 126 U/L   Total Bilirubin 0.6 <1.2 mg/dL   GFR, Estimated 44 (L) >60 mL/min    Comment: (NOTE) Calculated using the CKD-EPI Creatinine Equation (2021)    Anion gap 11 5 - 15    Comment: Performed at [facility], [address] [place], [place] [address]  Troponin I (High Sensitivity)     Status: Abnormal   Collection Time: 10/26/23  4:23 AM  Result Value Ref Range   Troponin I (High Sensitivity) 669 (HH) <  18 ng/L    Comment: CRITICAL VALUE NOTED. VALUE IS CONSISTENT WITH  PREVIOUSLY REPORTED/CALLED VALUE/HKP (NOTE) Elevated high sensitivity troponin I (hsTnI) values and significant  changes across serial measurements may suggest ACS but many other  chronic and acute conditions are known to elevate hsTnI results.  Refer to the "Links" section for chest pain algorithms and additional  guidance. Performed at [facility], [address] [place], [place] [address]   Hemoglobin and hematocrit, blood     Status: Abnormal   Collection Time: 10/26/23  8:32 AM  Result Value Ref Range   Hemoglobin 8.9 (L) 13.0 - 17.0 g/dL   HCT 60.4 (L) 54.0 - 98.1 %    Comment: Performed at [facility], [address] [place], [place] [address]  CBC     Status: Abnormal   Collection Time: 10/27/23  5:00 AM  Result Value Ref Range   WBC 7.5 4.0 - 10.5 K/uL   RBC 3.08 (L) 4.22 - 5.81 MIL/uL   Hemoglobin 9.0 (L) 13.0 - 17.0 g/dL   HCT 82.9 (L) 56.2 - 13.0 %   MCV 89.9 80.0 - 100.0 fL   MCH 29.2 26.0 - 34.0 pg   MCHC 32.5 30.0 - 36.0 g/dL   RDW 86.5 (H) 78.4 - 69.6 %   Platelets 156 150 - 400 K/uL   nRBC 0.0 0.0 - 0.2 %    Comment: Performed at [facility], [address], [place], [place] [address]  Basic metabolic panel     Status: Abnormal   Collection Time: 10/27/23  5:00 AM  Result Value Ref Range   Sodium 139 135 - 145 mmol/L   Potassium 3.6 3.5 - 5.1 mmol/L   Chloride 107 98 - 111 mmol/L   CO2 22 22 - 32 mmol/L   Glucose, Bld 86 70 - 99 mg/dL    Comment: Glucose reference range applies only to samples taken after fasting for at least 8 hours.   BUN 36 (H) 8 - 23 mg/dL   Creatinine, Ser 4.13 (H) 0.61 - 1.24 mg/dL   Calcium 8.4 (L) 8.9 - 10.3 mg/dL   GFR, Estimated 55 (L) >60 mL/min    Comment: (NOTE) Calculated using the CKD-EPI Creatinine Equation (2021)    Anion gap 10 5 - 15    Comment: Performed at [facility], [address] [place], [place] [address]  Glucose, capillary     Status: None   Collection Time:  10/27/23  7:47 AM  Result Value Ref Range   Glucose-Capillary 85 70 - 99 mg/dL    Comment: Glucose reference range applies only to samples taken after fasting for at least 8 hours.  ECHOCARDIOGRAM COMPLETE     Status: None   Collection Time: 10/27/23  3:56 PM  Result Value Ref Range   Weight 2,080 oz   Height 66 in   BP 142/76 mmHg   Ao pk vel 1.87 m/s   AV Area VTI 2.18 cm2   AR max vel 2.03 cm2   AV Mean grad 7.0 mmHg   AV Peak grad 14.0 mmHg   S' Lateral 2.40 cm   AV Area mean vel 2.17 cm2   Area-P 1/2 2.77 cm2   MV VTI 2.02 cm2   Est EF 60 - 65%   CBC     Status: Abnormal   Collection Time: 10/28/23  3:36 AM  Result Value Ref Range   WBC 12.4 (H) 4.0 - 10.5 K/uL   RBC  3.20 (L) 4.22 - 5.81 MIL/uL   Hemoglobin 9.4 (L) 13.0 - 17.0 g/dL   HCT 62.1 (L) 30.8 - 65.7 %   MCV 88.8 80.0 - 100.0 fL   MCH 29.4 26.0 - 34.0 pg   MCHC 33.1 30.0 - 36.0 g/dL   RDW 84.6 (H) 96.2 - 95.2 %   Platelets 187 150 - 400 K/uL   nRBC 0.0 0.0 - 0.2 %    Comment: Performed at [facility], [address] [place], [place] [address]  Phosphorus     Status: None   Collection Time: 10/28/23  3:36 AM  Result Value Ref Range   Phosphorus 4.3 2.5 - 4.6 mg/dL    Comment: Performed at [facility], [address] [place], [place] [address]  Magnesium     Status: Abnormal   Collection Time: 10/28/23  3:36 AM  Result Value Ref Range   Magnesium 1.5 (L) 1.7 - 2.4 mg/dL    Comment: Performed at [facility], [address], [place], [place] [address]  Basic metabolic panel     Status: Abnormal   Collection Time: 10/28/23 12:51 PM  Result Value Ref Range   Sodium 135 135 - 145 mmol/L   Potassium 3.9 3.5 - 5.1 mmol/L   Chloride 106 98 - 111 mmol/L   CO2 22 22 - 32 mmol/L   Glucose, Bld 126 (H) 70 - 99 mg/dL    Comment: Glucose reference range applies only to samples taken after fasting for at least 8 hours.   BUN 31 (H) 8 - 23 mg/dL   Creatinine, Ser 6.64 0.61 - 1.24  mg/dL   Calcium 8.2 (L) 8.9 - 10.3 mg/dL   GFR, Estimated 58 (L) >60 mL/min    Comment: (NOTE) Calculated using the CKD-EPI Creatinine Equation (2021)    Anion gap 7 5 - 15    Comment: Performed at [facility], [address], [place], [place] [address]    Radiology ECHOCARDIOGRAM COMPLETE Result Date: 10/29/2023    ECHOCARDIOGRAM REPORT   Patient Name:   KENYA BEVELS Date of Exam: 10/27/2023 Medical Rec #:  [id number]         Height:       66.0 in Accession #:    [id number]        Weight:       130.0 lb Date of Birth:  Oct 23, 1936         BSA:          1.665 m Patient Age:    87 years          BP:           140/58 mmHg Patient Gender: M                 HR:           68 bpm. Exam Location:  [facility] Procedure: 2D Echo, Cardiac Doppler and Color Doppler Indications:    NSTEMI  History:        Patient has no prior history of Echocardiogram examinations.                 Acute MI and CAD, PAD; Risk Factors:Hypertension and                 Dyslipidemia. AAA, CKD.  Sonographer:    Mikki Harbor Referring Phys: [id number] BERNARD AYIKU IMPRESSIONS  1. Left ventricular ejection fraction, by estimation, is 60 to 65%. The left ventricle has normal  function. The left ventricle has no regional wall motion abnormalities. There is moderate asymmetric left ventricular hypertrophy of the basal-septal segment. Left ventricular diastolic parameters are indeterminate.  2. Right ventricular systolic function is normal. The right ventricular size is normal. There is normal pulmonary artery systolic pressure. The estimated right ventricular systolic pressure is 25.3 mmHg.  3. The mitral valve is degenerative. Mild mitral valve regurgitation. No evidence of mitral stenosis. Severe mitral annular calcification.  4. The aortic valve is abnormal. There is moderate calcification of the aortic valve. Aortic valve regurgitation is trivial. Aortic valve sclerosis/calcification is present, without any evidence of aortic  stenosis on this exam. AV gradient interrogated from apical window only, consider Doppler evaluation from multiple acoustic windows to further evaluate for aortic valve stenosis if clinically relevant.  5. The inferior vena cava is normal in size with greater than 50% respiratory variability, suggesting right atrial pressure of 3 mmHg. FINDINGS  Left Ventricle: Left ventricular ejection fraction, by estimation, is 60 to 65%. The left ventricle has normal function. The left ventricle has no regional wall motion abnormalities. The left ventricular internal cavity size was normal in size. There is  moderate asymmetric left ventricular hypertrophy of the basal-septal segment. Left ventricular diastolic parameters are indeterminate. Right Ventricle: The right ventricular size is normal. No increase in right ventricular wall thickness. Right ventricular systolic function is normal. There is normal pulmonary artery systolic pressure. The tricuspid regurgitant velocity is 2.36 m/s, and  with an assumed right atrial pressure of 3 mmHg, the estimated right ventricular systolic pressure is 25.3 mmHg. Left Atrium: Left atrial size was normal in size. Right Atrium: Right atrial size was normal in size. Pericardium: There is no evidence of pericardial effusion. Mitral Valve: The mitral valve is degenerative in appearance. Severe mitral annular calcification. Mild mitral valve regurgitation. No evidence of mitral valve stenosis. MV peak gradient, 10.8 mmHg. The mean mitral valve gradient is 3.0 mmHg. Tricuspid Valve: The tricuspid valve is normal in structure. Tricuspid valve regurgitation is mild . No evidence of tricuspid stenosis. Aortic Valve: The aortic valve is abnormal. There is moderate calcification of the aortic valve. Aortic valve regurgitation is trivial. Aortic valve sclerosis/calcification is present, without any evidence of aortic stenosis. Aortic valve mean gradient measures 7.0 mmHg. Aortic valve peak gradient  measures 14.0 mmHg. Aortic valve area, by VTI measures 2.18 cm. Pulmonic Valve: The pulmonic valve was normal in structure. Pulmonic valve regurgitation is trivial. No evidence of pulmonic stenosis. Aorta: The aortic root and ascending aorta are structurally normal, with no evidence of dilitation and the aortic root is normal in size and structure. Ascending aorta measurements are within normal limits for age when indexed to body surface area. Venous: The inferior vena cava is normal in size with greater than 50% respiratory variability, suggesting right atrial pressure of 3 mmHg. IAS/Shunts: No atrial level shunt detected by color flow Doppler.  LEFT VENTRICLE PLAX 2D LVIDd:         4.10 cm   Diastology LVIDs:         2.40 cm   LV e' medial:    9.03 cm/s LV PW:         0.90 cm   LV E/e' medial:  15.2 LV IVS:        1.40 cm   LV e' lateral:   7.94 cm/s LVOT diam:     2.00 cm   LV E/e' lateral: 17.3 LV SV:  91 LV SV Index:   55 LVOT Area:     3.14 cm  RIGHT VENTRICLE RV Basal diam:  3.80 cm RV Mid diam:    4.00 cm RV S prime:     19.40 cm/s TAPSE (M-mode): 2.6 cm LEFT ATRIUM             Index        RIGHT ATRIUM           Index LA diam:        3.90 cm 2.34 cm/m   RA Area:     18.20 cm LA Vol (A2C):   55.5 ml 33.33 ml/m  RA Volume:   50.60 ml  30.39 ml/m LA Vol (A4C):   42.0 ml 25.22 ml/m LA Biplane Vol: 51.7 ml 31.05 ml/m  AORTIC VALVE                     PULMONIC VALVE AV Area (Vmax):    2.03 cm      PV Vmax:       1.00 m/s AV Area (Vmean):   2.17 cm      PV Peak grad:  4.0 mmHg AV Area (VTI):     2.18 cm AV Vmax:           187.00 cm/s AV Vmean:          120.000 cm/s AV VTI:            0.416 m AV Peak Grad:      14.0 mmHg AV Mean Grad:      7.0 mmHg LVOT Vmax:         121.00 cm/s LVOT Vmean:        83.000 cm/s LVOT VTI:          0.289 m LVOT/AV VTI ratio: 0.69  AORTA Ao Root diam: 3.70 cm Ao Asc diam:  2.80 cm MITRAL VALVE                TRICUSPID VALVE MV Area (PHT): 2.77 cm     TR Peak grad:    22.3 mmHg MV Area VTI:   2.02 cm     TR Vmax:        236.00 cm/s MV Peak grad:  10.8 mmHg MV Mean grad:  3.0 mmHg     SHUNTS MV Vmax:       1.64 m/s     Systemic VTI:  0.29 m MV Vmean:      78.1 cm/s    Systemic Diam: 2.00 cm MV Decel Time: 274 msec MV E velocity: 137.00 cm/s MV A velocity: 141.00 cm/s MV E/A ratio:  0.97 Weston Brass MD Electronically signed by Weston Brass MD Signature Date/Time: 10/29/2023/10:31:13 PM    Final    CT ANGIO GI BLEED Result Date: 10/25/2023 CLINICAL DATA:  Weakness, fatigue. EXAM: CTA ABDOMEN AND PELVIS WITHOUT AND WITH CONTRAST TECHNIQUE: Multidetector CT imaging of the abdomen and pelvis was performed using the standard protocol during bolus administration of intravenous contrast. Multiplanar reconstructed images and MIPs were obtained and reviewed to evaluate the vascular anatomy. RADIATION DOSE REDUCTION: This exam was performed according to the departmental dose-optimization program which includes automated exposure control, adjustment of the mA and/or kV according to patient size and/or use of iterative reconstruction technique. CONTRAST:  80mL OMNIPAQUE IOHEXOL 350 MG/ML SOLN COMPARISON:  May 08, 2023. FINDINGS: VASCULAR Aorta: 4.3 cm saccular infrarenal abdominal aortic aneurysm is noted. No dissection is noted. Celiac: Patent without  evidence of aneurysm, dissection, vasculitis or significant stenosis. SMA: Severe stenosis is noted at origin secondary to calcified plaque. Renals: Severe stenosis is noted at origin of right renal artery secondary to calcified plaque. Mild stenosis is noted at proximal portion of left renal artery secondary to calcified plaque. IMA: Occluded at origin. Inflow: Grossly patent with patent stent in the proximal right external iliac artery. Proximal Outflow: Bilateral common femoral and visualized portions of the superficial and profunda femoral arteries are patent without evidence of aneurysm, dissection, vasculitis or significant  stenosis. Veins: No obvious venous abnormality within the limitations of this arterial phase study. Review of the MIP images confirms the above findings. NON-VASCULAR Lower chest: No acute abnormality. Hepatobiliary: Minimal cholelithiasis. No biliary dilatation. Liver is unremarkable. Pancreas: Unremarkable. No pancreatic ductal dilatation or surrounding inflammatory changes. Spleen: Normal in size without focal abnormality. Adrenals/Urinary Tract: Adrenal glands appear normal. Right renal atrophy is noted. Chronic right hydronephrosis. No ureteral dilatation is noted. Urinary bladder is unremarkable. Stomach/Bowel: Stomach is within normal limits. Appendix appears normal. No evidence of bowel wall thickening, distention, or inflammatory changes. Sigmoid diverticulosis without inflammation. No definite evidence of contrast extravasation to suggest active gastrointestinal bleeding. Lymphatic: No adenopathy. Reproductive: Mild prostatic enlargement. Other: No ascites or hernia. Musculoskeletal: Multilevel degenerative changes are noted in lumbar spine. No acute osseous abnormality seen. IMPRESSION: VASCULAR No definite evidence of active gastrointestinal bleeding. 4.3 cm infrarenal abdominal aortic aneurysm. Recommend follow-up CT or MR as appropriate in 12 months and referral to or continued care with vascular specialist. (Ref.: J Vasc Surg. 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 Mild stenosis is noted at origin of left renal artery. 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 Aortic Atherosclerosis (ICD10-I70.0). NON-VASCULAR Minimal cholelithiasis. Sigmoid diverticulosis without bleeding. Mild prostatic enlargement. Chronic right hydronephrosis. Electronically Signed   By: Lupita Raider M.D.   On: 10/25/2023 10:12    Assessment/Plan  Atherosclerosis of native arteries of the extremities with  ulceration (HCC) We had a long talk today regarding his PAD and the need for anticoagulation of some sort.  He is at high risk of recurrent thrombosis given his extensive thrombotic and occlusive episode a few months ago.  It does not sound like he can tolerate a full dose of Xarelto so we could do the prophylactic dose of 2.5 mg daily for PAD.  The risk of bleeding on this dose is extremely small although never 0.  This seems like the most reasonable option at this time.  He will keep his scheduled follow-up appointment after the first of the year.  I will send in a prescription for the lower dose of Xarelto to his pharmacy today.  GI bleeding Decrease his Xarelto dose secondary to this.  AAA (abdominal aortic aneurysm) without rupture (HCC) Duplex a couple months ago shows this to be stable at 4 cm in maximal diameter and this was about 4.3 cm on CT scan so no major changes.  No immediate need for repair.  This could be a source of embolic disease which could be an issue going forward.   Carotid stenosis Carotid duplex recently shows stable 1 to 39% ICA stenosis bilaterally.  Almost a decade status post right carotid endarterectomy.  On Xarelto and aspirin.  Continue to follow annually.  Hyperlipidemia lipid control important in reducing the progression of atherosclerotic disease. Continue statin therapy     Essential hypertension, benign blood pressure control important in  reducing the progression of atherosclerotic disease. On appropriate oral medications.  Festus Barren, MD  10/31/2023 11:06 AM    This note was created with Dragon medical transcription system.  Any errors from dictation are purely unintentional

## 2023-10-31 NOTE — Assessment & Plan Note (Signed)
We had a long talk today regarding his PAD and the need for anticoagulation of some sort.  He is at high risk of recurrent thrombosis given his extensive thrombotic and occlusive episode a few months ago.  It does not sound like he can tolerate a full dose of Xarelto so we could do the prophylactic dose of 2.5 mg daily for PAD.  The risk of bleeding on this dose is extremely small although never 0.  This seems like the most reasonable option at this time.  He will keep his scheduled follow-up appointment after the first of the year.  I will send in a prescription for the lower dose of Xarelto to his pharmacy today.

## 2023-10-31 NOTE — Assessment & Plan Note (Signed)
Decrease his Xarelto dose secondary to this.

## 2023-12-05 ENCOUNTER — Encounter (INDEPENDENT_AMBULATORY_CARE_PROVIDER_SITE_OTHER): Payer: Self-pay | Admitting: Vascular Surgery

## 2023-12-05 ENCOUNTER — Ambulatory Visit (INDEPENDENT_AMBULATORY_CARE_PROVIDER_SITE_OTHER): Payer: Medicare Other

## 2023-12-05 ENCOUNTER — Ambulatory Visit (INDEPENDENT_AMBULATORY_CARE_PROVIDER_SITE_OTHER): Payer: Medicare Other | Admitting: Vascular Surgery

## 2023-12-05 VITALS — BP 149/74 | HR 67 | Resp 18 | Ht 66.0 in | Wt 138.9 lb

## 2023-12-05 DIAGNOSIS — I6523 Occlusion and stenosis of bilateral carotid arteries: Secondary | ICD-10-CM | POA: Diagnosis not present

## 2023-12-05 DIAGNOSIS — I7025 Atherosclerosis of native arteries of other extremities with ulceration: Secondary | ICD-10-CM

## 2023-12-05 DIAGNOSIS — I10 Essential (primary) hypertension: Secondary | ICD-10-CM

## 2023-12-05 DIAGNOSIS — E785 Hyperlipidemia, unspecified: Secondary | ICD-10-CM

## 2023-12-05 DIAGNOSIS — I7143 Infrarenal abdominal aortic aneurysm, without rupture: Secondary | ICD-10-CM | POA: Diagnosis not present

## 2023-12-05 DIAGNOSIS — I70211 Atherosclerosis of native arteries of extremities with intermittent claudication, right leg: Secondary | ICD-10-CM | POA: Diagnosis not present

## 2023-12-05 MED ORDER — CLOPIDOGREL BISULFATE 75 MG PO TABS: 75.0000 mg | ORAL_TABLET | Freq: Every day | ORAL | 6 refills | Status: DC

## 2023-12-05 NOTE — Progress Notes (Signed)
MRN : [id number]  Bryan Welch is a 88 y.o. (1936-10-02) male who presents with chief complaint of  Chief Complaint  Patient presents with   Follow-up    2-3 month ABI  .  History of Present Illness: Patient returns today in follow up of his PAD.  He underwent extensive right lower extremity revascularization about 3 to 4 months ago.  He had a GI bleed resulting in a decrease in his Xarelto dose down to 2.5 mg on a visit with [place] last month just before Christmas.  He says he is currently doing reasonably well.  His right foot is numb sometimes but it is nowhere near as painful as it was.  The previous ulcer that was present before his revascularization has healed.  His noninvasive studies today show a drop in his right ABI down to 0.48 with a digit pressure of 33.  His left ABI is stable at 0.68 with a digit pressure of 33.  Current Outpatient Medications  Medication Sig Dispense Refill   acetaminophen (TYLENOL) 500 MG tablet Take 1,000 mg by mouth at bedtime.     ALPRAZolam (XANAX) 0.25 MG tablet Take 0.25 mg by mouth at bedtime.     aspirin EC 81 MG tablet Take 81 mg daily by mouth.      Cholecalciferol 25 MCG (1000 UT) capsule Take 2,000 Units by mouth daily.     clopidogrel (PLAVIX) 75 MG tablet Take 1 tablet (75 mg total) by mouth daily. 30 tablet 6   gabapentin (NEURONTIN) 300 MG capsule Take 300 mg by mouth 2 (two) times daily.     hydrochlorothiazide (HYDRODIURIL) 25 MG tablet Take 1 tablet by mouth daily.     Multiple Vitamins-Minerals (PRESERVISION AREDS 2 PO) Take 1 tablet by mouth 2 (two) times daily.     omeprazole (PRILOSEC) 20 MG capsule Take 20 mg by mouth at bedtime.     potassium chloride (KLOR-CON) 10 MEQ tablet Take 10 mEq by mouth daily.     pravastatin (PRAVACHOL) 20 MG tablet Take 20 mg by mouth at bedtime.     rivaroxaban (XARELTO) 2.5 MG TABS tablet Take 1 tablet (2.5 mg total) by mouth 2 (two) times daily. 60 tablet 3   tamsulosin (FLOMAX) 0.4 MG CAPS  capsule Take 0.4 mg by mouth daily.  3   No current facility-administered medications for this visit.    Past Medical History:  Diagnosis Date   Arthritis    Toes   Coronary artery disease    History of hiatal hernia    Hyperlipidemia    Hypertension    Kidney disorder    "right kidney doesn't work"   Presence of stent in artery    "4 in legs, 1 in neck"   Vascular abnormality    Wears dentures    full upper and lower    Past Surgical History:  Procedure Laterality Date   AMPUTATION TOE Right 09/16/2021   Procedure: AMPUTATION TOE;  Surgeon: Rosetta Posner, DPM;  Location: [facility];  Service: Podiatry;  Laterality: Right;   BACK SURGERY     CAROTID STENT Right    CATARACT EXTRACTION W/PHACO Left 06/17/2019   Procedure: CATARACT EXTRACTION PHACO AND INTRAOCULAR LENS PLACEMENT (IOC) LEFT;  Surgeon: Nevada Crane, MD;  Location: [facility];  Service: Ophthalmology;  Laterality: Left;   CATARACT EXTRACTION W/PHACO Right 07/08/2019   Procedure: CATARACT EXTRACTION PHACO AND INTRAOCULAR LENS PLACEMENT (IOC) right  00:39.2  10.8%  5.02  ;  Surgeon: Nevada Crane, MD;  Location: [facility];  Service: Ophthalmology;  Laterality: Right;   COLONOSCOPY WITH PROPOFOL N/A 04/13/2023   Procedure: COLONOSCOPY WITH PROPOFOL;  Surgeon: Jaynie Collins, DO;  Location: [facility] ENDOSCOPY;  Service: Gastroenterology;  Laterality: N/A;   ENTEROSCOPY N/A 05/10/2023   Procedure: ENTEROSCOPY;  Surgeon: Midge Minium, MD;  Location: [facility] ENDOSCOPY;  Service: Endoscopy;  Laterality: N/A;   ENTEROSCOPY N/A 10/27/2023   Procedure: ENTEROSCOPY;  Surgeon: Toney Reil, MD;  Location: [facility] ENDOSCOPY;  Service: Gastroenterology;  Laterality: N/A;   ESOPHAGOGASTRODUODENOSCOPY (EGD) WITH PROPOFOL N/A 04/13/2023   Procedure: ESOPHAGOGASTRODUODENOSCOPY (EGD) WITH PROPOFOL;  Surgeon: Jaynie Collins, DO;  Location: [facility] ENDOSCOPY;  Service: Gastroenterology;   Laterality: N/A;   EYE SURGERY     GIVENS CAPSULE STUDY N/A 05/09/2023   Procedure: GIVENS CAPSULE STUDY;  Surgeon: Midge Minium, MD;  Location: [facility] END[facility];  Service: Endoscopy;  Laterality: N/A;   GIVENS CAPSULE STUDY  10/27/2023   Procedure: GIVENS CAPSULE STUDY;  Surgeon: Toney Reil, MD;  Location: [facility] ENDOSCOPY;  Service: Gastroenterology;;   HOT HEMOSTASIS  05/10/2023   Procedure: HOT HEMOSTASIS (ARGON PLASMA COAGULATION/BICAP);  Surgeon: Midge Minium, MD;  Location: [facility] ENDOSCOPY;  Service: Endoscopy;;   LEG SURGERY Bilateral    stent placement   LOWER EXTREMITY ANGIOGRAPHY Right 08/19/2021   Procedure: LOWER EXTREMITY ANGIOGRAPHY;  Surgeon: Annice Needy, MD;  Location: [facility] INVASIVE CV LAB;  Service: Cardiovascular;  Laterality: Right;   LOWER EXTREMITY ANGIOGRAPHY Right 08/09/2023   Procedure: Lower Extremity Angiography;  Surgeon: Annice Needy, MD;  Location: [facility] INVASIVE CV LAB;  Service: Cardiovascular;  Laterality: Right;   LOWER EXTREMITY ANGIOGRAPHY Right 08/10/2023   Procedure: Lower Extremity Angiography;  Surgeon: Annice Needy, MD;  Location: [facility] INVASIVE CV LAB;  Service: Cardiovascular;  Laterality: Right;   PERIPHERAL VASCULAR BALLOON ANGIOPLASTY Right 09/21/2017   Procedure: PERIPHERAL VASCULAR BALLOON ANGIOPLASTY;  Surgeon: Annice Needy, MD;  Location: [facility] INVASIVE CV LAB;  Service: Cardiovascular;  Laterality: Right;   REVERSE SHOULDER ARTHROPLASTY Right 11/26/2019   Procedure: REVERSE SHOULDER ARTHROPLASTY;  Surgeon: Christena Flake, MD;  Location: [facility] ORS;  Service: Orthopedics;  Laterality: Right;   SUBMUCOSAL TATTOO INJECTION  10/27/2023   Procedure: SUBMUCOSAL TATTOO INJECTION;  Surgeon: Toney Reil, MD;  Location: [facility] ENDOSCOPY;  Service: Gastroenterology;;     Social History   Tobacco Use   Smoking status: Former    Current packs/day: 0.00    Types: Cigarettes    Quit date: 2001    Years since quitting: 24.0   Smokeless tobacco: Never   Vaping Use   Vaping status: Never Used  Substance Use Topics   Alcohol use: Not Currently   Drug use: No      Family History  Problem Relation Age of Onset   Bladder Cancer Mother    Prostate cancer Father    Prostate cancer Brother    Diabetes Maternal Grandfather      Allergies  Allergen Reactions   Codeine Itching     REVIEW OF SYSTEMS (Negative unless checked)   Constitutional: [] Weight loss  [] Fever  [] Chills Cardiac: [] Chest pain   [] Chest pressure   [] Palpitations   [] Shortness of breath when laying flat   [] Shortness of breath at rest   [x] Shortness of breath with exertion. Vascular:  [x] Pain in legs with walking   [] Pain in legs at rest   [] Pain in legs when laying flat   [x] Claudication   [] Pain  in feet when walking  [] Pain in feet at rest  [] Pain in feet when laying flat   [] History of DVT   [] Phlebitis   [] Swelling in legs   [] Varicose veins   [x] Non-healing ulcers Pulmonary:   [] Uses home oxygen   [] Productive cough   [] Hemoptysis   [] Wheeze  [] COPD   [] Asthma Neurologic:  [] Dizziness  [] Blackouts   [] Seizures   [] History of stroke   [] History of TIA  [] Aphasia   [] Temporary blindness   [] Dysphagia   [] Weakness or numbness in arms   [] Weakness or numbness in legs Musculoskeletal:  [x] Arthritis   [] Joint swelling   [x] Joint pain   [] Low back pain Hematologic:  [] Easy bruising  [] Easy bleeding   [] Hypercoagulable state   [] Anemic   Gastrointestinal:  [] Blood in stool   [] Vomiting blood  [] Gastroesophageal reflux/heartburn   [] Abdominal pain Genitourinary:  [x] Chronic kidney disease   [] Difficult urination  [] Frequent urination  [] Burning with urination   [] Hematuria Skin:  [] Rashes   [x] Ulcers   [x] Wounds Psychological:  [] History of anxiety   []  History of major depression.  Physical Examination  BP (!) 149/74   Pulse 67   Resp 18   Ht 5\' 6"  (1.676 m)   Wt 138 lb 14.4 oz (63 kg)   BMI 22.42 kg/m  Gen:  WD/WN, NAD.  Appears younger than stated age Head:  [place]/AT, No temporalis wasting. Ear/Nose/Throat: Hearing grossly intact, nares w/o erythema or drainage Eyes: Conjunctiva clear. Sclera non-icteric Neck: Supple.  Trachea midline Pulmonary:  Good air movement, no use of accessory muscles.  Cardiac: RRR, no JVD Vascular:  Vessel Right Left  Radial Palpable Palpable                          PT Not Palpable 1+ Palpable  DP Not Palpable Not Palpable   Gastrointestinal: soft, non-tender/non-distended. No guarding/reflex.  Musculoskeletal: M/S 5/5 throughout.  No deformity or atrophy.  Slightly sluggish capillary refill.  No significant lower extremity edema. Neurologic: Sensation grossly intact in extremities.  Symmetrical.  Speech is fluent.  Psychiatric: Judgment intact, Mood & affect appropriate for pt's clinical situation. Dermatologic: No rashes or ulcers noted.  No cellulitis or open wounds.      Labs Recent Results (from the past 2160 hours)  Basic metabolic panel     Status: Abnormal   Collection Time: 10/25/23  7:34 AM  Result Value Ref Range   Sodium 139 135 - 145 mmol/L   Potassium 3.4 (L) 3.5 - 5.1 mmol/L   Chloride 106 98 - 111 mmol/L   CO2 19 (L) 22 - 32 mmol/L   Glucose, Bld 151 (H) 70 - 99 mg/dL    Comment: Glucose reference range applies only to samples taken after fasting for at least 8 hours.   BUN 62 (H) 8 - 23 mg/dL   Creatinine, Ser 1.61 (H) 0.61 - 1.24 mg/dL   Calcium 8.5 (L) 8.9 - 10.3 mg/dL   GFR, Estimated 41 (L) >60 mL/min    Comment: (NOTE) Calculated using the CKD-EPI Creatinine Equation (2021)    Anion gap 14 5 - 15    Comment: Performed at [facility], [address] [place], [place] [address]  CBC     Status: Abnormal   Collection Time: 10/25/23  7:34 AM  Result Value Ref Range   WBC 10.8 (H) 4.0 - 10.5 K/uL   RBC 1.97 (L) 4.22 - 5.81 MIL/uL   Hemoglobin 5.4 (L)  13.0 - 17.0 g/dL   HCT 16.1 (L) 09.6 - 04.5 %   MCV 91.9 80.0 - 100.0 fL   MCH 27.4 26.0 - 34.0 pg   MCHC 29.8  (L) 30.0 - 36.0 g/dL   RDW 40.9 (H) 81.1 - 91.4 %   Platelets 237 150 - 400 K/uL   nRBC 0.0 0.0 - 0.2 %    Comment: Performed at [facility], [address] [place], [place] [address]  Troponin I (High Sensitivity)     Status: Abnormal   Collection Time: 10/25/23  7:34 AM  Result Value Ref Range   Troponin I (High Sensitivity) 194 (HH) <18 ng/L    Comment: CRITICAL RESULT CALLED TO, READ BACK BY AND VERIFIED WITH TAYLOR MASSEY 10/25/23 @ 0816 BY SH (NOTE) Elevated high sensitivity troponin I (hsTnI) values and significant  changes across serial measurements may suggest ACS but many other  chronic and acute conditions are known to elevate hsTnI results.  Refer to the "Links" section for chest pain algorithms and additional  guidance. Performed at [facility], [address] [place], [place] [address]   Hepatic function panel     Status: Abnormal   Collection Time: 10/25/23  7:34 AM  Result Value Ref Range   Total Protein 5.9 (L) 6.5 - 8.1 g/dL   Albumin 3.2 (L) 3.5 - 5.0 g/dL   AST 21 15 - 41 U/L   ALT 12 0 - 44 U/L   Alkaline Phosphatase 47 38 - 126 U/L   Total Bilirubin 0.5 <1.2 mg/dL   Bilirubin, Direct <8.6 0.0 - 0.2 mg/dL   Indirect Bilirubin NOT CALCULATED 0.3 - 0.9 mg/dL    Comment: Performed at [facility], [address] [place], [place] [address]  Ferritin     Status: Abnormal   Collection Time: 10/25/23  7:34 AM  Result Value Ref Range   Ferritin 14 (L) 24 - 336 ng/mL    Comment: Performed at [facility], [address] [place], [place] [address]  Folate     Status: None   Collection Time: 10/25/23  7:34 AM  Result Value Ref Range   Folate 17.4 >5.9 ng/mL    Comment: Performed at [facility], [address] [place], [place] [address]  Type and screen [facility]     Status: None   Collection Time: 10/25/23  8:09 AM  Result Value Ref Range   ABO/RH(D) O POS    Antibody Screen NEG     Sample Expiration 10/28/2023,2359    Unit Number [id number]    Blood Component Type RED CELLS,LR    Unit division 00    Status of Unit ISSUED,FINAL    Transfusion Status OK TO TRANSFUSE    Crossmatch Result Compatible    Unit Number [id number]    Blood Component Type RED CELLS,LR    Unit division 00    Status of Unit ISSUED,FINAL    Transfusion Status OK TO TRANSFUSE    Crossmatch Result Compatible    Unit Number [id number]    Blood Component Type RED CELLS,LR    Unit division 00    Status of Unit ISSUED,FINAL    Transfusion Status OK TO TRANSFUSE    Crossmatch Result Compatible    Unit Number [id number]    Blood Component Type RED CELLS,LR    Unit division 00    Status of Unit REL FROM [facility]    Transfusion Status OK TO TRANSFUSE  Crossmatch Result      Compatible Performed at [facility], [address] [place], [place] [address]   BPAM RBC     Status: None   Collection Time: 10/25/23  8:09 AM  Result Value Ref Range   ISSUE DATE / TIME [id number]    Blood Product Unit Number [id number]    PRODUCT CODE [id number]    Unit Type and Rh 5100    Blood Product Expiration Date 366440347425    ISSUE DATE / TIME 956387564332    Blood Product Unit Number [id number]    PRODUCT CODE [id number]    Unit Type and Rh 5100    Blood Product Expiration Date 355732202542    ISSUE DATE / TIME 706237628315    Blood Product Unit Number [id number]    PRODUCT CODE [id number]    Unit Type and Rh 5100    Blood Product Expiration Date 703500938182    Blood Product Unit Number [id number]    PRODUCT CODE [id number]    Unit Type and Rh 5100    Blood Product Expiration Date 585277824235   Prepare RBC (crossmatch)     Status: None   Collection Time: 10/25/23  8:13 AM  Result Value Ref Range   Order Confirmation      ORDER PROCESSED BY BLOOD BANK Performed at [facility], [address] [place], [place] [address]   Protime-INR     Status:  Abnormal   Collection Time: 10/25/23  8:20 AM  Result Value Ref Range   Prothrombin Time 23.2 (H) 11.4 - 15.2 seconds   INR 2.0 (H) 0.8 - 1.2    Comment: (NOTE) INR goal varies based on device and disease states. Performed at [facility], [address] [place], [place] [address]   APTT     Status: None   Collection Time: 10/25/23  8:20 AM  Result Value Ref Range   aPTT 32 24 - 36 seconds    Comment: Performed at [facility], [address] [place], [place] [address]  Urinalysis, Routine w reflex microscopic -Urine, Clean Catch     Status: Abnormal   Collection Time: 10/25/23 11:17 AM  Result Value Ref Range   Color, Urine STRAW (A) YELLOW   APPearance CLEAR (A) CLEAR   Specific Gravity, Urine 1.028 1.005 - 1.030   pH 5.0 5.0 - 8.0   Glucose, UA NEGATIVE NEGATIVE mg/dL   Hgb urine dipstick NEGATIVE NEGATIVE   Bilirubin Urine NEGATIVE NEGATIVE   Ketones, ur NEGATIVE NEGATIVE mg/dL   Protein, ur NEGATIVE NEGATIVE mg/dL   Nitrite NEGATIVE NEGATIVE   Leukocytes,Ua NEGATIVE NEGATIVE    Comment: Performed at [facility], [address] [place], [place] [address]  Prepare RBC (crossmatch)     Status: None   Collection Time: 10/25/23 12:00 PM  Result Value Ref Range   Order Confirmation      DUPLICATE REQUEST Performed at [facility], [address] [place], [place] [address]   Prepare RBC (crossmatch)     Status: None   Collection Time: 10/25/23  4:15 PM  Result Value Ref Range   Order Confirmation      ORDER PROCESSED BY BLOOD BANK Performed at [facility], [address] [place], [place] [address]   Hemoglobin and hematocrit, blood     Status: Abnormal   Collection Time: 10/25/23  6:40 PM  Result Value Ref Range   Hemoglobin 8.1 (L) 13.0 - 17.0 g/dL    Comment: REPEATED TO  VERIFY   HCT 24.9 (L) 39.0 - 52.0 %    Comment: Performed at [facility], [address] [place], [place] [address]  Troponin I  (High Sensitivity)     Status: Abnormal   Collection Time: 10/25/23  8:54 PM  Result Value Ref Range   Troponin I (High Sensitivity) 688 (HH) <18 ng/L    Comment: CRITICAL VALUE NOTED. VALUE IS CONSISTENT WITH PREVIOUSLY REPORTED/CALLED VALUE SKL (NOTE) Elevated high sensitivity troponin I (hsTnI) values and significant  changes across serial measurements may suggest ACS but many other  chronic and acute conditions are known to elevate hsTnI results.  Refer to the "Links" section for chest pain algorithms and additional  guidance. Performed at [facility], [address] [place], [place] [address]   Hemoglobin and hematocrit, blood     Status: Abnormal   Collection Time: 10/25/23  8:54 PM  Result Value Ref Range   Hemoglobin 8.4 (L) 13.0 - 17.0 g/dL   HCT 09.8 (L) 11.9 - 14.7 %    Comment: Performed at [facility], [address] [place], [place] [address]  Iron and TIBC     Status: None   Collection Time: 10/25/23  8:54 PM  Result Value Ref Range   Iron 59 45 - 182 ug/dL   TIBC 213 086 - 578 ug/dL   Saturation Ratios 18 17.9 - 39.5 %   UIBC 263 ug/dL    Comment: Performed at [facility], [address] [place], [place] [address]  Vitamin B12     Status: None   Collection Time: 10/25/23  8:54 PM  Result Value Ref Range   Vitamin B-12 350 180 - 914 pg/mL    Comment: (NOTE) This assay is not validated for testing neonatal or myeloproliferative syndrome specimens for Vitamin B12 levels. Performed at [facility], [address], [place], [place] [address]   Hemoglobin and hematocrit, blood     Status: Abnormal   Collection Time: 10/26/23  1:31 AM  Result Value Ref Range   Hemoglobin 9.0 (L) 13.0 - 17.0 g/dL   HCT 13.2 (L) 44.0 - 10.2 %    Comment: Performed at [facility], [address] [place], [place] [address]  Comprehensive metabolic panel     Status: Abnormal   Collection Time: 10/26/23  4:23 AM  Result Value Ref  Range   Sodium 139 135 - 145 mmol/L   Potassium 3.5 3.5 - 5.1 mmol/L   Chloride 108 98 - 111 mmol/L   CO2 20 (L) 22 - 32 mmol/L   Glucose, Bld 105 (H) 70 - 99 mg/dL    Comment: Glucose reference range applies only to samples taken after fasting for at least 8 hours.   BUN 52 (H) 8 - 23 mg/dL   Creatinine, Ser 6.44 (H) 0.61 - 1.24 mg/dL   Calcium 8.0 (L) 8.9 - 10.3 mg/dL   Total Protein 5.3 (L) 6.5 - 8.1 g/dL   Albumin 3.0 (L) 3.5 - 5.0 g/dL   AST 16 15 - 41 U/L   ALT 12 0 - 44 U/L   Alkaline Phosphatase 45 38 - 126 U/L   Total Bilirubin 0.6 <1.2 mg/dL   GFR, Estimated 44 (L) >60 mL/min    Comment: (NOTE) Calculated using the CKD-EPI Creatinine Equation (2021)    Anion gap 11 5 - 15    Comment: Performed at [facility], [address] [place], [place] [address]  Troponin I (High  Sensitivity)     Status: Abnormal   Collection Time: 10/26/23  4:23 AM  Result Value Ref Range   Troponin I (High Sensitivity) 669 (HH) <18 ng/L    Comment: CRITICAL VALUE NOTED. VALUE IS CONSISTENT WITH PREVIOUSLY REPORTED/CALLED VALUE/HKP (NOTE) Elevated high sensitivity troponin I (hsTnI) values and significant  changes across serial measurements may suggest ACS but many other  chronic and acute conditions are known to elevate hsTnI results.  Refer to the "Links" section for chest pain algorithms and additional  guidance. Performed at [facility], [address] [place], [place] [address]   Hemoglobin and hematocrit, blood     Status: Abnormal   Collection Time: 10/26/23  8:32 AM  Result Value Ref Range   Hemoglobin 8.9 (L) 13.0 - 17.0 g/dL   HCT 40.9 (L) 81.1 - 91.4 %    Comment: Performed at [facility], [address] [place], [place] [address]  CBC     Status: Abnormal   Collection Time: 10/27/23  5:00 AM  Result Value Ref Range   WBC 7.5 4.0 - 10.5 K/uL   RBC 3.08 (L) 4.22 - 5.81 MIL/uL   Hemoglobin 9.0 (L) 13.0 - 17.0 g/dL   HCT 62.1 (L) 30.8 - 65.7 %    MCV 89.9 80.0 - 100.0 fL   MCH 29.2 26.0 - 34.0 pg   MCHC 32.5 30.0 - 36.0 g/dL   RDW 84.6 (H) 96.2 - 95.2 %   Platelets 156 150 - 400 K/uL   nRBC 0.0 0.0 - 0.2 %    Comment: Performed at [facility], [address], [place], [place] [address]  Basic metabolic panel     Status: Abnormal   Collection Time: 10/27/23  5:00 AM  Result Value Ref Range   Sodium 139 135 - 145 mmol/L   Potassium 3.6 3.5 - 5.1 mmol/L   Chloride 107 98 - 111 mmol/L   CO2 22 22 - 32 mmol/L   Glucose, Bld 86 70 - 99 mg/dL    Comment: Glucose reference range applies only to samples taken after fasting for at least 8 hours.   BUN 36 (H) 8 - 23 mg/dL   Creatinine, Ser 4.40 (H) 0.61 - 1.24 mg/dL   Calcium 8.4 (L) 8.9 - 10.3 mg/dL   GFR, Estimated 55 (L) >60 mL/min    Comment: (NOTE) Calculated using the CKD-EPI Creatinine Equation (2021)    Anion gap 10 5 - 15    Comment: Performed at [facility], [address] [place], [place] [address]  Glucose, capillary     Status: None   Collection Time: 10/27/23  7:47 AM  Result Value Ref Range   Glucose-Capillary 85 70 - 99 mg/dL    Comment: Glucose reference range applies only to samples taken after fasting for at least 8 hours.  ECHOCARDIOGRAM COMPLETE     Status: None   Collection Time: 10/27/23  3:56 PM  Result Value Ref Range   Weight 2,080 oz   Height 66 in   BP 142/76 mmHg   Ao pk vel 1.87 m/s   AV Area VTI 2.18 cm2   AR max vel 2.03 cm2   AV Mean grad 7.0 mmHg   AV Peak grad 14.0 mmHg   S' Lateral 2.40 cm   AV Area mean vel 2.17 cm2   Area-P 1/2 2.77 cm2   MV VTI 2.02 cm2   Est EF 60 - 65%   CBC  Status: Abnormal   Collection Time: 10/28/23  3:36 AM  Result Value Ref Range   WBC 12.4 (H) 4.0 - 10.5 K/uL   RBC 3.20 (L) 4.22 - 5.81 MIL/uL   Hemoglobin 9.4 (L) 13.0 - 17.0 g/dL   HCT 52.8 (L) 41.3 - 24.4 %   MCV 88.8 80.0 - 100.0 fL   MCH 29.4 26.0 - 34.0 pg   MCHC 33.1 30.0 - 36.0 g/dL   RDW 01.0 (H) 27.2 - 53.6 %    Platelets 187 150 - 400 K/uL   nRBC 0.0 0.0 - 0.2 %    Comment: Performed at [facility], [address], [place], [place] [address]  Phosphorus     Status: None   Collection Time: 10/28/23  3:36 AM  Result Value Ref Range   Phosphorus 4.3 2.5 - 4.6 mg/dL    Comment: Performed at [facility], [address] [place], [place] [address]  Magnesium     Status: Abnormal   Collection Time: 10/28/23  3:36 AM  Result Value Ref Range   Magnesium 1.5 (L) 1.7 - 2.4 mg/dL    Comment: Performed at [facility], [address], [place], [place] [address]  Basic metabolic panel     Status: Abnormal   Collection Time: 10/28/23 12:51 PM  Result Value Ref Range   Sodium 135 135 - 145 mmol/L   Potassium 3.9 3.5 - 5.1 mmol/L   Chloride 106 98 - 111 mmol/L   CO2 22 22 - 32 mmol/L   Glucose, Bld 126 (H) 70 - 99 mg/dL    Comment: Glucose reference range applies only to samples taken after fasting for at least 8 hours.   BUN 31 (H) 8 - 23 mg/dL   Creatinine, Ser 7.56 0.61 - 1.24 mg/dL   Calcium 8.2 (L) 8.9 - 10.3 mg/dL   GFR, Estimated 58 (L) >60 mL/min    Comment: (NOTE) Calculated using the CKD-EPI Creatinine Equation (2021)    Anion gap 7 5 - 15    Comment: Performed at [facility], [address] [place], [place] [address]    Radiology No results found.  Assessment/Plan  Atherosclerosis of native arteries of extremity with intermittent claudication (HCC) His noninvasive studies today show a drop in his right ABI down to 0.48 with a digit pressure of 33.  His left ABI is stable at 0.68 with a digit pressure of 33.  This is a difficult situation as he still has an untreated colonic polyp that is to be removed at [facility] in a month or so.  This would preclude thrombolysis and thrombectomy as he had GI bleeding with this last time.  He cannot be on full dose anticoagulation.  Fortunately, he is not currently having any limb threatening symptoms.  He does not  have ulceration.  He does not have rest pain.  He would prefer to continue monitoring this unless his symptoms worsen I think that is reasonable.  It is very important to avoid any blistering or wounds on the feet as they will have a difficult time healing.  We will continue short interval follow-up and I will see him back in a couple of months. Also, the Xarelto appears as if it is going to be cost prohibitive as it is several hundred dollars a month.  Particularly with his GI bleeding issues dropping that to Plavix daily is also a reasonable option.  I can send in a prescription for the Plavix.  AAA (abdominal  aortic aneurysm) without rupture (HCC) To be checked again in the coming months.  This could be a source of distal embolization as well.  Carotid stenosis Carotid duplex last year shows stable 1 to 39% ICA stenosis bilaterally.  Almost a decade status post right carotid endarterectomy.  On Xarelto and aspirin.  Continue to follow annually.   Hyperlipidemia lipid control important in reducing the progression of atherosclerotic disease. Continue statin therapy     Essential hypertension, benign blood pressure control important in reducing the progression of atherosclerotic disease. On appropriate oral medications.  Festus Barren, MD  12/05/2023 1:04 PM    This note was created with Dragon medical transcription system.  Any errors from dictation are purely unintentional

## 2023-12-05 NOTE — Assessment & Plan Note (Addendum)
His noninvasive studies today show a drop in his right ABI down to 0.48 with a digit pressure of 33.  His left ABI is stable at 0.68 with a digit pressure of 33.  This is a difficult situation as he still has an untreated colonic polyp that is to be removed at [facility] in a month or so.  This would preclude thrombolysis and thrombectomy as he had GI bleeding with this last time.  He cannot be on full dose anticoagulation.  Fortunately, he is not currently having any limb threatening symptoms.  He does not have ulceration.  He does not have rest pain.  He would prefer to continue monitoring this unless his symptoms worsen I think that is reasonable.  It is very important to avoid any blistering or wounds on the feet as they will have a difficult time healing.  We will continue short interval follow-up and I will see him back in a couple of months. Also, the Xarelto appears as if it is going to be cost prohibitive as it is several hundred dollars a month.  Particularly with his GI bleeding issues dropping that to Plavix daily is also a reasonable option.  I can send in a prescription for the Plavix.

## 2023-12-05 NOTE — Assessment & Plan Note (Signed)
To be checked again in the coming months.  This could be a source of distal embolization as well.

## 2023-12-06 LAB — VAS US ABI WITH/WO TBI
Left ABI: 0.68
Right ABI: 0.48

## 2024-01-09 ENCOUNTER — Ambulatory Visit (INDEPENDENT_AMBULATORY_CARE_PROVIDER_SITE_OTHER): Payer: Medicare Other | Admitting: Vascular Surgery

## 2024-01-18 ENCOUNTER — Inpatient Hospital Stay (HOSPITAL_BASED_OUTPATIENT_CLINIC_OR_DEPARTMENT_OTHER): Payer: Medicare Other | Admitting: Internal Medicine

## 2024-01-18 ENCOUNTER — Inpatient Hospital Stay: Payer: Medicare Other | Attending: Internal Medicine

## 2024-01-18 ENCOUNTER — Inpatient Hospital Stay: Payer: Medicare Other

## 2024-01-18 ENCOUNTER — Encounter: Payer: Self-pay | Admitting: Internal Medicine

## 2024-01-18 VITALS — BP 110/50 | HR 78 | Temp 97.8°F | Resp 14 | Wt 135.0 lb

## 2024-01-18 DIAGNOSIS — Z8052 Family history of malignant neoplasm of bladder: Secondary | ICD-10-CM | POA: Diagnosis not present

## 2024-01-18 DIAGNOSIS — Z8042 Family history of malignant neoplasm of prostate: Secondary | ICD-10-CM | POA: Insufficient documentation

## 2024-01-18 DIAGNOSIS — K2270 Barrett's esophagus without dysplasia: Secondary | ICD-10-CM | POA: Insufficient documentation

## 2024-01-18 DIAGNOSIS — Z7982 Long term (current) use of aspirin: Secondary | ICD-10-CM | POA: Diagnosis not present

## 2024-01-18 DIAGNOSIS — D509 Iron deficiency anemia, unspecified: Secondary | ICD-10-CM

## 2024-01-18 DIAGNOSIS — E538 Deficiency of other specified B group vitamins: Secondary | ICD-10-CM | POA: Insufficient documentation

## 2024-01-18 DIAGNOSIS — Z7902 Long term (current) use of antithrombotics/antiplatelets: Secondary | ICD-10-CM | POA: Diagnosis not present

## 2024-01-18 DIAGNOSIS — N183 Chronic kidney disease, stage 3 unspecified: Secondary | ICD-10-CM | POA: Insufficient documentation

## 2024-01-18 DIAGNOSIS — K31811 Angiodysplasia of stomach and duodenum with bleeding: Secondary | ICD-10-CM | POA: Diagnosis not present

## 2024-01-18 DIAGNOSIS — R5383 Other fatigue: Secondary | ICD-10-CM | POA: Insufficient documentation

## 2024-01-18 DIAGNOSIS — I2510 Atherosclerotic heart disease of native coronary artery without angina pectoris: Secondary | ICD-10-CM | POA: Diagnosis not present

## 2024-01-18 DIAGNOSIS — I129 Hypertensive chronic kidney disease with stage 1 through stage 4 chronic kidney disease, or unspecified chronic kidney disease: Secondary | ICD-10-CM | POA: Diagnosis not present

## 2024-01-18 DIAGNOSIS — E785 Hyperlipidemia, unspecified: Secondary | ICD-10-CM | POA: Diagnosis not present

## 2024-01-18 DIAGNOSIS — Z79899 Other long term (current) drug therapy: Secondary | ICD-10-CM | POA: Diagnosis not present

## 2024-01-18 DIAGNOSIS — Z87891 Personal history of nicotine dependence: Secondary | ICD-10-CM | POA: Diagnosis not present

## 2024-01-18 DIAGNOSIS — R197 Diarrhea, unspecified: Secondary | ICD-10-CM | POA: Diagnosis not present

## 2024-01-18 DIAGNOSIS — I714 Abdominal aortic aneurysm, without rupture, unspecified: Secondary | ICD-10-CM | POA: Insufficient documentation

## 2024-01-18 DIAGNOSIS — Z7901 Long term (current) use of anticoagulants: Secondary | ICD-10-CM | POA: Diagnosis not present

## 2024-01-18 DIAGNOSIS — R531 Weakness: Secondary | ICD-10-CM | POA: Insufficient documentation

## 2024-01-18 LAB — CBC WITH DIFFERENTIAL/PLATELET
Abs Immature Granulocytes: 0.03 10*3/uL (ref 0.00–0.07)
Basophils Absolute: 0.1 10*3/uL (ref 0.0–0.1)
Basophils Relative: 1 %
Eosinophils Absolute: 0.4 10*3/uL (ref 0.0–0.5)
Eosinophils Relative: 6 %
HCT: 36.5 % — ABNORMAL LOW (ref 39.0–52.0)
Hemoglobin: 11.1 g/dL — ABNORMAL LOW (ref 13.0–17.0)
Immature Granulocytes: 0 %
Lymphocytes Relative: 14 %
Lymphs Abs: 1 10*3/uL (ref 0.7–4.0)
MCH: 24 pg — ABNORMAL LOW (ref 26.0–34.0)
MCHC: 30.4 g/dL (ref 30.0–36.0)
MCV: 78.8 fL — ABNORMAL LOW (ref 80.0–100.0)
Monocytes Absolute: 0.7 10*3/uL (ref 0.1–1.0)
Monocytes Relative: 9 %
Neutro Abs: 4.9 10*3/uL (ref 1.7–7.7)
Neutrophils Relative %: 70 %
Platelets: 217 10*3/uL (ref 150–400)
RBC: 4.63 MIL/uL (ref 4.22–5.81)
RDW: 16.2 % — ABNORMAL HIGH (ref 11.5–15.5)
WBC: 7 10*3/uL (ref 4.0–10.5)
nRBC: 0 % (ref 0.0–0.2)

## 2024-01-18 LAB — IRON AND TIBC
Iron: 23 ug/dL — ABNORMAL LOW (ref 45–182)
Saturation Ratios: 6 % — ABNORMAL LOW (ref 17.9–39.5)
TIBC: 402 ug/dL (ref 250–450)
UIBC: 379 ug/dL

## 2024-01-18 LAB — VITAMIN B12: Vitamin B-12: 800 pg/mL (ref 180–914)

## 2024-01-18 LAB — FERRITIN: Ferritin: 8 ng/mL — ABNORMAL LOW (ref 24–336)

## 2024-01-18 MED ORDER — IRON SUCROSE 20 MG/ML IV SOLN
200.0000 mg | Freq: Once | INTRAVENOUS | Status: AC
Start: 2024-01-18 — End: 2024-01-18
  Administered 2024-01-18: 200 mg via INTRAVENOUS
  Filled 2024-01-18: qty 10

## 2024-01-18 NOTE — Progress Notes (Signed)
 Patient had a couple of endoscopies, echocardiogram, and PERIPHERAL VASCULAR CATHETERIZATION, back in the early fall of 2024, and Dr Wyn Quaker changed his blood thinner medication to Plavix. His Diastolic BP was 50 so I rechecked again manually and I got the same reading 130/50. He is doing ok, just having some fatigue.

## 2024-01-18 NOTE — Progress Notes (Signed)
 [facility]  [phone]) [phone] [phone]) [phone]  ID: [name] OB: [birth year]  MR#: [id number]  HKV#:[id number]  Patient Care Team: Gracelyn Nurse, MD as PCP - General (Internal Medicine) Michaelyn Barter, MD as Consulting Physician (Oncology)  REASON FOR REFERRAL: Iron deficiency anemia  HPI: ZOHAIR EPP is a 88 y.o. male with past medical history of CAD, hypertension, hyperlipidemia, PAD status post stents and right toe amputation, CKD stage III was referred to hematology for management of iron deficiency anemia.  Patient presented to ER on 03/22/2023 for generalized weakness and fatigue.  He noticed some blood in the stool about a week and a half ago.  Labs showed hemoglobin of 6.1, iron 13, saturation 3%, ferritin 5, folate 26, vitamin B12 182.  He received 1 unit of PRBC.  Repeat CBC from 03/28/2023 showed hemoglobin 9.2.   Was evaluated by Dr. Timothy Lasso of GI.  Colonoscopy done on 04/13/2023 showed 14 mm polypoid lesion at the ileocecal valve.  Had difficulty intubating TI.  One 1 to 2 mm polyp in the cecum.  11 to 12 mm polyp in ascending colon.  One 2 to 3 mm polyp in the descending colon.  Pathology showed tubular adenoma. Upper endoscopy showed gastritis and esophageal mucosal changes classified as Barrett's.  Pathology showed mild gastritis and Barrett's esophagus. VCE performed in July 2024 reportedly revealed multiple sites of bleeding in the proximal SB. A subsequent Push Enteroscopy performed the next day revealed several angioectasias in the duodenum and jejunum that were treated.  .   10/25/2023 - readmitted with GI bleed.  Hemoglobin of 5.4. s/p 2 units PRBC. Underwent capsule endoscopy which showed bleeding cecal polyp. Push Enteroscopy was repeated and it was normal. Patient was recommended to follow-up with [facility] for removal of polyp.  Had consultation with Dr. Lonell Face, [facility].  His GI bleeding is thought likely secondary to  intermittent bleeding episodes from small bowel angioectasias.  At this time, he does not think any further intervention is needed based on his age and other comorbidities.  Xarelto has been discontinued.  Switched to Plavix by Dr. Wyn Quaker which has less of a risk of GI bleed.   Interval history Patient was seen today as follow-up for iron deficiency anemia and labs. Admitted in December with GI bleeding requiring blood transfusions.  Since then has consulted GI at [facility] and was told no further intervention was needed.  Has switched his Xarelto to Plavix.   REVIEW OF SYSTEMS:   ROS  As per HPI. Otherwise, a complete review of systems is negative.  PAST MEDICAL HISTORY: Past Medical History:  Diagnosis Date   Arthritis    Toes   Coronary artery disease    History of hiatal hernia    Hyperlipidemia    Hypertension    Kidney disorder    "right kidney doesn't work"   Presence of stent in artery    "4 in legs, 1 in neck"   Vascular abnormality    Wears dentures    full upper and lower    PAST SURGICAL HISTORY: Past Surgical History:  Procedure Laterality Date   AMPUTATION TOE Right 09/16/2021   Procedure: AMPUTATION TOE;  Surgeon: Rosetta Posner, DPM;  Location: [facility];  Service: Podiatry;  Laterality: Right;   BACK SURGERY     CAROTID STENT Right    CATARACT EXTRACTION W/PHACO Left 06/17/2019   Procedure: CATARACT EXTRACTION PHACO AND INTRAOCULAR LENS PLACEMENT (IOC) LEFT;  Surgeon: Willey Blade  Loraine Leriche, MD;  Location: [facility];  Service: Ophthalmology;  Laterality: Left;   CATARACT EXTRACTION W/PHACO Right 07/08/2019   Procedure: CATARACT EXTRACTION PHACO AND INTRAOCULAR LENS PLACEMENT (IOC) right  00:39.2  10.8%  5.02  ;  Surgeon: Nevada Crane, MD;  Location: [facility];  Service: Ophthalmology;  Laterality: Right;   COLONOSCOPY WITH PROPOFOL N/A 04/13/2023   Procedure: COLONOSCOPY WITH PROPOFOL;  Surgeon: Jaynie Collins, DO;  Location:  [facility];  Service: Gastroenterology;  Laterality: N/A;   ENTEROSCOPY N/A 05/10/2023   Procedure: ENTEROSCOPY;  Surgeon: Midge Minium, MD;  Location: [facility] ENDOSCOPY;  Service: Endoscopy;  Laterality: N/A;   ENTEROSCOPY N/A 10/27/2023   Procedure: ENTEROSCOPY;  Surgeon: Toney Reil, MD;  Location: [facility] ENDOSCOPY;  Service: Gastroenterology;  Laterality: N/A;   ESOPHAGOGASTRODUODENOSCOPY (EGD) WITH PROPOFOL N/A 04/13/2023   Procedure: ESOPHAGOGASTRODUODENOSCOPY (EGD) WITH PROPOFOL;  Surgeon: Jaynie Collins, DO;  Location: [facility] ENDOSCOPY;  Service: Gastroenterology;  Laterality: N/A;   EYE SURGERY     GIVENS CAPSULE STUDY N/A 05/09/2023   Procedure: GIVENS CAPSULE STUDY;  Surgeon: Midge Minium, MD;  Location: [facility] ENDOSCOPY;  Service: Endoscopy;  Laterality: N/A;   GIVENS CAPSULE STUDY  10/27/2023   Procedure: GIVENS CAPSULE STUDY;  Surgeon: Toney Reil, MD;  Location: [facility] ENDOSCOPY;  Service: Gastroenterology;;   HOT HEMOSTASIS  05/10/2023   Procedure: HOT HEMOSTASIS (ARGON PLASMA COAGULATION/BICAP);  Surgeon: Midge Minium, MD;  Location: [facility] ENDOSCOPY;  Service: Endoscopy;;   LEG SURGERY Bilateral    stent placement   LOWER EXTREMITY ANGIOGRAPHY Right 08/19/2021   Procedure: LOWER EXTREMITY ANGIOGRAPHY;  Surgeon: Annice Needy, MD;  Location: [facility] IN[facility] CV [facility];  Service: Cardiovascular;  Laterality: Right;   LOWER EXTREMITY ANGIOGRAPHY Right 08/09/2023   Procedure: Lower Extremity Angiography;  Surgeon: Annice Needy, MD;  Location: [facility];  Service: Cardiovascular;  Laterality: Right;   LOWER EXTREMITY ANGIOGRAPHY Right 08/10/2023   Procedure: Lower Extremity Angiography;  Surgeon: Annice Needy, MD;  Location: [facility];  Service: Cardiovascular;  Laterality: Right;   PERIPHERAL VASCULAR BALLOON ANGIOPLASTY Right 09/21/2017   Procedure: PERIPHERAL VASCULAR BALLOON ANGIOPLASTY;  Surgeon: Annice Needy, MD;  Location: [facility];  Service:  Cardiovascular;  Laterality: Right;   REVERSE SHOULDER ARTHROPLASTY Right 11/26/2019   Procedure: REVERSE SHOULDER ARTHROPLASTY;  Surgeon: Christena Flake, MD;  Location: [facility] ORS;  Service: Orthopedics;  Laterality: Right;   SUBMUCOSAL TATTOO INJECTION  10/27/2023   Procedure: SUBMUCOSAL TATTOO INJECTION;  Surgeon: Toney Reil, MD;  Location: [facility] ENDOSCOPY;  Service: Gastroenterology;;    FAMILY HISTORY: Family History  Problem Relation Age of Onset   Bladder Cancer Mother    Prostate cancer Father    Prostate cancer Brother    Diabetes Maternal Grandfather     HEALTH MAINTENANCE: Social History   Tobacco Use   Smoking status: Former    Current packs/day: 0.00    Types: Cigarettes    Quit date: 2001    Years since quitting: 24.2   Smokeless tobacco: Never  Vaping Use   Vaping status: Never Used  Substance Use Topics   Alcohol use: Not Currently   Drug use: No     Allergies  Allergen Reactions   Codeine Itching    Current Outpatient Medications  Medication Sig Dispense Refill   pantoprazole (PROTONIX) 40 MG tablet Take by mouth.     acetaminophen (TYLENOL) 500 MG tablet Take 1,000 mg by mouth at bedtime.  ALPRAZolam (XANAX) 0.25 MG tablet Take 0.25 mg by mouth at bedtime.     aspirin EC 81 MG tablet Take 81 mg daily by mouth.      Cholecalciferol 25 MCG (1000 UT) capsule Take 2,000 Units by mouth daily.     clopidogrel (PLAVIX) 75 MG tablet Take 1 tablet (75 mg total) by mouth daily. 30 tablet 6   gabapentin (NEURONTIN) 300 MG capsule Take 300 mg by mouth 2 (two) times daily.     hydrochlorothiazide (HYDRODIURIL) 25 MG tablet Take 1 tablet by mouth daily.     Multiple Vitamins-Minerals (PRESERVISION AREDS 2 PO) Take 1 tablet by mouth 2 (two) times daily.     omeprazole (PRILOSEC) 20 MG capsule Take 20 mg by mouth at bedtime.     potassium chloride (KLOR-CON) 10 MEQ tablet Take 10 mEq by mouth daily.     pravastatin (PRAVACHOL) 20 MG tablet Take 20 mg by  mouth at bedtime.     rivaroxaban (XARELTO) 2.5 MG TABS tablet Take 1 tablet (2.5 mg total) by mouth 2 (two) times daily. 60 tablet 3   tamsulosin (FLOMAX) 0.4 MG CAPS capsule Take 0.4 mg by mouth daily.  3   No current facility-administered medications for this visit.    OBJECTIVE: Vitals:   01/18/24 1348  BP: (!) 110/50  Pulse: 78  Resp: 14  Temp: 97.8 F (36.6 C)  SpO2: 98%      Body mass index is 21.79 kg/m.      General: Well-developed, well-nourished, no acute distress. Eyes: Pink conjunctiva, anicteric sclera. HEENT: Normocephalic, moist mucous membranes, clear oropharnyx. Lungs: Clear to auscultation bilaterally. Heart: Regular rate and rhythm. No rubs, murmurs, or gallops. Abdomen: Soft, nontender, nondistended. No organomegaly noted, normoactive bowel sounds. Musculoskeletal: No edema, cyanosis, or clubbing. Neuro: Alert, answering all questions appropriately. Cranial nerves grossly intact. Skin: No rashes or petechiae noted. Psych: Normal affect. Lymphatics: No cervical, calvicular, axillary or inguinal LAD.   LAB RESULTS:  Lab Results  Component Value Date   NA 135 10/28/2023   K 3.9 10/28/2023   CL 106 10/28/2023   CO2 22 10/28/2023   GLUCOSE 126 (H) 10/28/2023   BUN 31 (H) 10/28/2023   CREATININE 1.22 10/28/2023   CALCIUM 8.2 (L) 10/28/2023   PROT 5.3 (L) 10/26/2023   ALBUMIN 3.0 (L) 10/26/2023   AST 16 10/26/2023   ALT 12 10/26/2023   ALKPHOS 45 10/26/2023   BILITOT 0.6 10/26/2023   GFRNONAA 58 (L) 10/28/2023   GFRAA 43 (L) 11/21/2019    Lab Results  Component Value Date   WBC 7.0 01/18/2024   NEUTROABS 4.9 01/18/2024   HGB 11.1 (L) 01/18/2024   HCT 36.5 (L) 01/18/2024   MCV 78.8 (L) 01/18/2024   PLT 217 01/18/2024    Lab Results  Component Value Date   TIBC 402 01/18/2024   TIBC 322 10/25/2023   TIBC 279 07/21/2023   FERRITIN 8 (L) 01/18/2024   FERRITIN 14 (L) 10/25/2023   FERRITIN 97 07/21/2023   IRONPCTSAT 6 (L) 01/18/2024    IRONPCTSAT 18 10/25/2023   IRONPCTSAT 14 (L) 07/21/2023     STUDIES: No results found.  ASSESSMENT AND PLAN:   TERRE ZABRISKIE is a 88 y.o. male with pmh of CAD, hypertension, hyperlipidemia, PAD status post stents and right toe amputation, CKD stage III was referred to hematology for management of iron deficiency anemia.  # Iron deficiency anemia -Likely due to intermittent bleed from small bowel angioectasia.  Full history as  above. -Could not tolerate oral iron due to diarrhea.   -Hemoglobin is 11.1 with MCV of 78.  Proceed with IV Venofer today.  Iron panel is pending.  Will schedule him for further iron infusions once ferritin is available. -Xarelto has been discontinued.  # Vitamin B12 deficiency -Recent B12 350.  Continue with oral B12 supplements.  # PAD -Status post stents in bilateral lower extremity.   -Xarelto was discontinued with 2 episodes of GI bleed. -Switched to Plavix.  Continue with aspirin.  Follows with Dr. Wyn Quaker.  # AAA (abdominal aortic aneurysm) without rupture (HCC) - Follows with Dr. Wyn Quaker.   # CKD stage III -stable  Orders Placed This Encounter  Procedures   CBC with Differential (Cancer Center Only)   Ferritin   Iron and TIBC([facility])   RTC in 4 months for MD visit, labs, possible Venofer  Patient expressed understanding and was in agreement with this plan. He also understands that He can call clinic at any time with any questions, concerns, or complaints.   I spent a total of 30 minutes reviewing chart data, face-to-face evaluation with the patient, counseling and coordination of care as detailed above.  Michaelyn Barter, MD   01/18/2024 2:55 PM , CKD stage IIIa

## 2024-01-25 ENCOUNTER — Inpatient Hospital Stay

## 2024-01-26 ENCOUNTER — Inpatient Hospital Stay

## 2024-01-26 ENCOUNTER — Encounter: Payer: Self-pay | Admitting: Internal Medicine

## 2024-01-26 VITALS — BP 140/56 | HR 60 | Temp 97.5°F | Resp 16

## 2024-01-26 DIAGNOSIS — D509 Iron deficiency anemia, unspecified: Secondary | ICD-10-CM | POA: Diagnosis not present

## 2024-01-26 MED ORDER — IRON SUCROSE 20 MG/ML IV SOLN
200.0000 mg | Freq: Once | INTRAVENOUS | Status: AC
Start: 2024-01-26 — End: 2024-01-26
  Administered 2024-01-26: 200 mg via INTRAVENOUS

## 2024-01-26 NOTE — Patient Instructions (Signed)

## 2024-01-26 NOTE — Addendum Note (Signed)
 Addended byMichaelyn Barter on: 01/26/2024 02:39 PM   Modules accepted: Orders

## 2024-02-01 ENCOUNTER — Inpatient Hospital Stay

## 2024-02-01 VITALS — BP 113/81 | HR 62 | Temp 96.6°F | Resp 17

## 2024-02-01 DIAGNOSIS — D509 Iron deficiency anemia, unspecified: Secondary | ICD-10-CM | POA: Diagnosis not present

## 2024-02-01 MED ORDER — IRON SUCROSE 20 MG/ML IV SOLN
200.0000 mg | Freq: Once | INTRAVENOUS | Status: AC
  Administered 2024-02-01: 200 mg via INTRAVENOUS

## 2024-02-01 MED ORDER — SODIUM CHLORIDE 0.9% FLUSH
10.0000 mL | Freq: Once | INTRAVENOUS | Status: AC | PRN
  Administered 2024-02-01: 10 mL
  Filled 2024-02-01: qty 10

## 2024-02-02 ENCOUNTER — Other Ambulatory Visit (INDEPENDENT_AMBULATORY_CARE_PROVIDER_SITE_OTHER): Payer: Medicare Other

## 2024-02-02 ENCOUNTER — Ambulatory Visit (INDEPENDENT_AMBULATORY_CARE_PROVIDER_SITE_OTHER): Payer: Medicare Other | Admitting: Vascular Surgery

## 2024-02-05 ENCOUNTER — Other Ambulatory Visit (INDEPENDENT_AMBULATORY_CARE_PROVIDER_SITE_OTHER): Payer: Self-pay | Admitting: Vascular Surgery

## 2024-02-05 DIAGNOSIS — I6523 Occlusion and stenosis of bilateral carotid arteries: Secondary | ICD-10-CM

## 2024-02-05 DIAGNOSIS — E785 Hyperlipidemia, unspecified: Secondary | ICD-10-CM

## 2024-02-05 DIAGNOSIS — I10 Essential (primary) hypertension: Secondary | ICD-10-CM

## 2024-02-05 DIAGNOSIS — I7143 Infrarenal abdominal aortic aneurysm, without rupture: Secondary | ICD-10-CM

## 2024-02-05 DIAGNOSIS — I70211 Atherosclerosis of native arteries of extremities with intermittent claudication, right leg: Secondary | ICD-10-CM

## 2024-02-06 ENCOUNTER — Encounter (INDEPENDENT_AMBULATORY_CARE_PROVIDER_SITE_OTHER): Payer: Self-pay | Admitting: Vascular Surgery

## 2024-02-06 ENCOUNTER — Ambulatory Visit (INDEPENDENT_AMBULATORY_CARE_PROVIDER_SITE_OTHER): Payer: Medicare Other | Admitting: Vascular Surgery

## 2024-02-06 ENCOUNTER — Ambulatory Visit (INDEPENDENT_AMBULATORY_CARE_PROVIDER_SITE_OTHER): Payer: Medicare Other

## 2024-02-06 VITALS — BP 173/73 | HR 60 | Resp 18 | Ht 66.0 in | Wt 139.0 lb

## 2024-02-06 DIAGNOSIS — I70211 Atherosclerosis of native arteries of extremities with intermittent claudication, right leg: Secondary | ICD-10-CM

## 2024-02-06 DIAGNOSIS — I6523 Occlusion and stenosis of bilateral carotid arteries: Secondary | ICD-10-CM | POA: Diagnosis not present

## 2024-02-06 DIAGNOSIS — I7143 Infrarenal abdominal aortic aneurysm, without rupture: Secondary | ICD-10-CM

## 2024-02-06 DIAGNOSIS — I10 Essential (primary) hypertension: Secondary | ICD-10-CM

## 2024-02-06 DIAGNOSIS — E785 Hyperlipidemia, unspecified: Secondary | ICD-10-CM

## 2024-02-06 DIAGNOSIS — K921 Melena: Secondary | ICD-10-CM

## 2024-02-06 DIAGNOSIS — I7025 Atherosclerosis of native arteries of other extremities with ulceration: Secondary | ICD-10-CM | POA: Diagnosis not present

## 2024-02-06 NOTE — Progress Notes (Unsigned)
 MRN : [id number]  Bryan Welch is a 88 y.o. (Mar 22, 1936) male who presents with chief complaint of No chief complaint on file. Marland Kitchen  History of Present Illness: Patient returns today in follow up of ***  Current Outpatient Medications  Medication Sig Dispense Refill   acetaminophen (TYLENOL) 500 MG tablet Take 1,000 mg by mouth at bedtime.     ALPRAZolam (XANAX) 0.25 MG tablet Take 0.25 mg by mouth at bedtime.     aspirin EC 81 MG tablet Take 81 mg daily by mouth.      Cholecalciferol 25 MCG (1000 UT) capsule Take 2,000 Units by mouth daily.     clopidogrel (PLAVIX) 75 MG tablet Take 1 tablet (75 mg total) by mouth daily. 30 tablet 6   gabapentin (NEURONTIN) 300 MG capsule Take 300 mg by mouth 2 (two) times daily.     hydrochlorothiazide (HYDRODIURIL) 25 MG tablet Take 1 tablet by mouth daily.     Multiple Vitamins-Minerals (PRESERVISION AREDS 2 PO) Take 1 tablet by mouth 2 (two) times daily.     omeprazole (PRILOSEC) 20 MG capsule Take 20 mg by mouth at bedtime.     pantoprazole (PROTONIX) 40 MG tablet Take by mouth.     potassium chloride (KLOR-CON) 10 MEQ tablet Take 10 mEq by mouth daily.     pravastatin (PRAVACHOL) 20 MG tablet Take 20 mg by mouth at bedtime.     rivaroxaban (XARELTO) 2.5 MG TABS tablet Take 1 tablet (2.5 mg total) by mouth 2 (two) times daily. 60 tablet 3   tamsulosin (FLOMAX) 0.4 MG CAPS capsule Take 0.4 mg by mouth daily.  3   No current facility-administered medications for this visit.    Past Medical History:  Diagnosis Date   Arthritis    Toes   Coronary artery disease    History of hiatal hernia    Hyperlipidemia    Hypertension    Kidney disorder    "right kidney doesn't work"   Presence of stent in artery    "4 in legs, 1 in neck"   Vascular abnormality    Wears dentures    full upper and lower    Past Surgical History:  Procedure Laterality Date   AMPUTATION TOE Right 09/16/2021   Procedure: AMPUTATION TOE;  Surgeon: Rosetta Posner,  DPM;  Location: [facility];  Service: Podiatry;  Laterality: Right;   BACK SURGERY     CAROTID STENT Right    CATARACT EXTRACTION W/PHACO Left 06/17/2019   Procedure: CATARACT EXTRACTION PHACO AND INTRAOCULAR LENS PLACEMENT (IOC) LEFT;  Surgeon: Nevada Crane, MD;  Location: [facility];  Service: Ophthalmology;  Laterality: Left;   CATARACT EXTRACTION W/PHACO Right 07/08/2019   Procedure: CATARACT EXTRACTION PHACO AND INTRAOCULAR LENS PLACEMENT (IOC) right  00:39.2  10.8%  5.02  ;  Surgeon: Nevada Crane, MD;  Location: [facility];  Service: Ophthalmology;  Laterality: Right;   COLONOSCOPY WITH PROPOFOL N/A 04/13/2023   Procedure: COLONOSCOPY WITH PROPOFOL;  Surgeon: Jaynie Collins, DO;  Location: [facility] ENDOSCOPY;  Service: Gastroenterology;  Laterality: N/A;   ENTEROSCOPY N/A 05/10/2023   Procedure: ENTEROSCOPY;  Surgeon: Midge Minium, MD;  Location: [facility] ENDOSCOPY;  Service: Endoscopy;  Laterality: N/A;   ENTEROSCOPY N/A 10/27/2023   Procedure: ENTEROSCOPY;  Surgeon: Toney Reil, MD;  Location: [facility] ENDOSCOPY;  Service: Gastroenterology;  Laterality: N/A;   ESOPHAGOGASTRODUODENOSCOPY (EGD) WITH PROPOFOL N/A 04/13/2023   Procedure: ESOPHAGOGASTRODUODENOSCOPY (EGD) WITH PROPOFOL;  Surgeon: Jaynie Collins,  DO;  Location: [facility] ENDOSCOPY;  Service: Gastroenterology;  Laterality: N/A;   EYE SURGERY     GIVENS CAPSULE STUDY N/A 05/09/2023   Procedure: GIVENS CAPSULE STUDY;  Surgeon: Midge Minium, MD;  Location: [facility] ENDOSCOPY;  Service: Endoscopy;  Laterality: N/A;   GIVENS CAPSULE STUDY  10/27/2023   Procedure: GIVENS CAPSULE STUDY;  Surgeon: Toney Reil, MD;  Location: [facility] ENDOSCOPY;  Service: Gastroenterology;;   HOT HEMOSTASIS  05/10/2023   Procedure: HOT HEMOSTASIS (ARGON PLASMA COAGULATION/BICAP);  Surgeon: Midge Minium, MD;  Location: [facility] ENDOSCOPY;  Service: Endoscopy;;   LEG SURGERY Bilateral    stent placement   LOWER EXTREMITY  ANGIOGRAPHY Right 08/19/2021   Procedure: LOWER EXTREMITY ANGIOGRAPHY;  Surgeon: Annice Needy, MD;  Location: [facility] INVASIVE CV LAB;  Service: Cardiovascular;  Laterality: Right;   LOWER EXTREMITY ANGIOGRAPHY Right 08/09/2023   Procedure: Lower Extremity Angiography;  Surgeon: Annice Needy, MD;  Location: [facility];  Service: Cardiovascular;  Laterality: Right;   LOWER EXTREMITY ANGIOGRAPHY Right 08/10/2023   Procedure: Lower Extremity Angiography;  Surgeon: Annice Needy, MD;  Location: [facility];  Service: Cardiovascular;  Laterality: Right;   PERIPHERAL VASCULAR BALLOON ANGIOPLASTY Right 09/21/2017   Procedure: PERIPHERAL VASCULAR BALLOON ANGIOPLASTY;  Surgeon: Annice Needy, MD;  Location: [facility];  Service: Cardiovascular;  Laterality: Right;   REVERSE SHOULDER ARTHROPLASTY Right 11/26/2019   Procedure: REVERSE SHOULDER ARTHROPLASTY;  Surgeon: Christena Flake, MD;  Location: [facility] ORS;  Service: Orthopedics;  Laterality: Right;   SUBMUCOSAL TATTOO INJECTION  10/27/2023   Procedure: SUBMUCOSAL TATTOO INJECTION;  Surgeon: Toney Reil, MD;  Location: [facility] ENDOSCOPY;  Service: Gastroenterology;;     Social History   Tobacco Use   Smoking status: Former    Current packs/day: 0.00    Types: Cigarettes    Quit date: 2001    Years since quitting: 24.2   Smokeless tobacco: Never  Vaping Use   Vaping status: Never Used  Substance Use Topics   Alcohol use: Not Currently   Drug use: No      Family History  Problem Relation Age of Onset   Bladder Cancer Mother    Prostate cancer Father    Prostate cancer Brother    Diabetes Maternal Grandfather      Allergies  Allergen Reactions   Codeine Itching     REVIEW OF SYSTEMS (Negative unless checked)   Constitutional: [] Weight loss  [] Fever  [] Chills Cardiac: [] Chest pain   [] Chest pressure   [] Palpitations   [] Shortness of breath when laying flat   [] Shortness of breath at rest   [x] Shortness of  breath with exertion. Vascular:  [x] Pain in legs with walking   [] Pain in legs at rest   [] Pain in legs when laying flat   [x] Claudication   [] Pain in feet when walking  [] Pain in feet at rest  [] Pain in feet when laying flat   [] History of DVT   [] Phlebitis   [] Swelling in legs   [] Varicose veins   [x] Non-healing ulcers Pulmonary:   [] Uses home oxygen   [] Productive cough   [] Hemoptysis   [] Wheeze  [] COPD   [] Asthma Neurologic:  [] Dizziness  [] Blackouts   [] Seizures   [] History of stroke   [] History of TIA  [] Aphasia   [] Temporary blindness   [] Dysphagia   [] Weakness or numbness in arms   [] Weakness or numbness in legs Musculoskeletal:  [x] Arthritis   [] Joint swelling   [x] Joint pain   [] Low  back pain Hematologic:  [] Easy bruising  [] Easy bleeding   [] Hypercoagulable state   [] Anemic   Gastrointestinal:  [] Blood in stool   [] Vomiting blood  [] Gastroesophageal reflux/heartburn   [] Abdominal pain Genitourinary:  [x] Chronic kidney disease   [] Difficult urination  [] Frequent urination  [] Burning with urination   [] Hematuria Skin:  [] Rashes   [x] Ulcers   [x] Wounds Psychological:  [] History of anxiety   []  History of major depression.  Physical Examination  There were no vitals taken for this visit. Gen:  WD/WN, NAD Head: [place]/AT, No temporalis wasting. Ear/Nose/Throat: Hearing grossly intact, nares w/o erythema or drainage Eyes: Conjunctiva clear. Sclera non-icteric Neck: Supple.  Trachea midline Pulmonary:  Good air movement, no use of accessory muscles.  Cardiac: RRR, no JVD Vascular:  Vessel Right Left  Radial Palpable Palpable                          PT Palpable Palpable  DP Palpable Palpable   Gastrointestinal: soft, non-tender/non-distended. No guarding/reflex.  Musculoskeletal: M/S 5/5 throughout.  No deformity or atrophy. *** edema. Neurologic: Sensation grossly intact in extremities.  Symmetrical.  Speech is fluent.  Psychiatric: Judgment intact, Mood & affect appropriate for  pt's clinical situation. Dermatologic: No rashes or ulcers noted.  No cellulitis or open wounds.      Labs Recent Results (from the past 2160 hours)  VAS [place] ABI WITH/WO TBI     Status: None   Collection Time: 12/05/23 10:40 AM  Result Value Ref Range   Right ABI 0.48    Left ABI 0.68   Vitamin B12     Status: None   Collection Time: 01/18/24 12:51 PM  Result Value Ref Range   Vitamin B-12 800 180 - 914 pg/mL    Comment: (NOTE) This assay is not validated for testing neonatal or myeloproliferative syndrome specimens for Vitamin B12 levels. Performed at [facility], [address], [place], [place] [address]   Iron and TIBC([facility])     Status: Abnormal   Collection Time: 01/18/24 12:52 PM  Result Value Ref Range   Iron 23 (L) 45 - 182 ug/dL   TIBC 914 782 - 956 ug/dL   Saturation Ratios 6 (L) 17.9 - 39.5 %   UIBC 379 ug/dL    Comment: Performed at [facility], [address] [place], [place] [address]  Ferritin     Status: Abnormal   Collection Time: 01/18/24 12:52 PM  Result Value Ref Range   Ferritin 8 (L) 24 - 336 ng/mL    Comment: Performed at [facility], [address] [place], [place] [address]  CBC with Differential/Platelet     Status: Abnormal   Collection Time: 01/18/24 12:52 PM  Result Value Ref Range   WBC 7.0 4.0 - 10.5 K/uL   RBC 4.63 4.22 - 5.81 MIL/uL   Hemoglobin 11.1 (L) 13.0 - 17.0 g/dL   HCT 69.6 (L) 29.5 - 28.4 %   MCV 78.8 (L) 80.0 - 100.0 fL   MCH 24.0 (L) 26.0 - 34.0 pg   MCHC 30.4 30.0 - 36.0 g/dL   RDW 13.2 (H) 44.0 - 10.2 %   Platelets 217 150 - 400 K/uL   nRBC 0.0 0.0 - 0.2 %   Neutrophils Relative % 70 %   Neutro Abs 4.9 1.7 - 7.7 K/uL   Lymphocytes Relative 14 %   Lymphs Abs 1.0 0.7 - 4.0 K/uL   Monocytes Relative 9 %  Monocytes Absolute 0.7 0.1 - 1.0 K/uL   Eosinophils Relative 6 %   Eosinophils Absolute 0.4 0.0 - 0.5 K/uL   Basophils Relative 1 %   Basophils Absolute 0.1 0.0 - 0.1 K/uL    Immature Granulocytes 0 %   Abs Immature Granulocytes 0.03 0.00 - 0.07 K/uL    Comment: Performed at [facility], [address] [place], [place] [address]    Radiology No results found.  Assessment/Plan  No problem-specific Assessment & Plan notes found for this encounter.  Carotid stenosis Carotid duplex last year shows stable 1 to 39% ICA stenosis bilaterally.  Almost a decade status post right carotid endarterectomy.  On Xarelto and aspirin.  Continue to follow annually.   Hyperlipidemia lipid control important in reducing the progression of atherosclerotic disease. Continue statin therapy     Essential hypertension, benign blood pressure control important in reducing the progression of atherosclerotic disease. On appropriate oral medications.  Festus Barren, MD  02/06/2024 8:14 AM    This note was created with Dragon medical transcription system.  Any errors from dictation are purely unintentional

## 2024-02-07 LAB — VAS US ABI WITH/WO TBI
Left ABI: 0.84
Right ABI: 0.85

## 2024-02-07 NOTE — Assessment & Plan Note (Signed)
 His ABIs today were up to 0.8 bilaterally and duplex did not show any obvious disease outside of tibial vessels.  The previous interventions appear to be patent.  Continue current medical regimen.  Doing Plavix, aspirin, and Pravachol.  Recheck in 6 months with noninvasive studies.

## 2024-02-07 NOTE — Assessment & Plan Note (Signed)
 Duplex shows this to be stable at 3.9 cm in maximal diameter without dramatic growth from previous studies.  Recheck at his follow-up visit in 6 months.  No role for intervention at this time.

## 2024-02-09 ENCOUNTER — Inpatient Hospital Stay: Attending: Internal Medicine

## 2024-02-09 VITALS — BP 136/51 | HR 60 | Temp 97.9°F | Resp 18

## 2024-02-09 DIAGNOSIS — Z79899 Other long term (current) drug therapy: Secondary | ICD-10-CM | POA: Insufficient documentation

## 2024-02-09 DIAGNOSIS — D509 Iron deficiency anemia, unspecified: Secondary | ICD-10-CM | POA: Diagnosis present

## 2024-02-09 MED ORDER — IRON SUCROSE 20 MG/ML IV SOLN
200.0000 mg | Freq: Once | INTRAVENOUS | Status: AC
  Administered 2024-02-09: 200 mg via INTRAVENOUS
  Filled 2024-02-09: qty 10

## 2024-02-09 MED ORDER — SODIUM CHLORIDE 0.9% FLUSH
10.0000 mL | Freq: Once | INTRAVENOUS | Status: AC | PRN
  Administered 2024-02-09: 10 mL
  Filled 2024-02-09: qty 10

## 2024-02-16 ENCOUNTER — Inpatient Hospital Stay

## 2024-02-16 VITALS — BP 163/54 | HR 66 | Temp 97.4°F | Resp 18

## 2024-02-16 DIAGNOSIS — D509 Iron deficiency anemia, unspecified: Secondary | ICD-10-CM

## 2024-02-16 MED ORDER — IRON SUCROSE 20 MG/ML IV SOLN
200.0000 mg | Freq: Once | INTRAVENOUS | Status: AC
  Administered 2024-02-16: 200 mg via INTRAVENOUS
  Filled 2024-02-16: qty 10

## 2024-02-16 MED ORDER — SODIUM CHLORIDE 0.9% FLUSH
10.0000 mL | Freq: Once | INTRAVENOUS | Status: AC | PRN
  Administered 2024-02-16: 10 mL
  Filled 2024-02-16: qty 10

## 2024-03-26 ENCOUNTER — Encounter (INDEPENDENT_AMBULATORY_CARE_PROVIDER_SITE_OTHER): Payer: Self-pay

## 2024-05-17 ENCOUNTER — Other Ambulatory Visit: Payer: Self-pay | Admitting: *Deleted

## 2024-05-17 DIAGNOSIS — E538 Deficiency of other specified B group vitamins: Secondary | ICD-10-CM

## 2024-05-17 DIAGNOSIS — D649 Anemia, unspecified: Secondary | ICD-10-CM

## 2024-05-17 DIAGNOSIS — D509 Iron deficiency anemia, unspecified: Secondary | ICD-10-CM

## 2024-05-19 ENCOUNTER — Other Ambulatory Visit (INDEPENDENT_AMBULATORY_CARE_PROVIDER_SITE_OTHER): Payer: Self-pay | Admitting: Vascular Surgery

## 2024-05-20 ENCOUNTER — Inpatient Hospital Stay

## 2024-05-20 ENCOUNTER — Inpatient Hospital Stay (HOSPITAL_BASED_OUTPATIENT_CLINIC_OR_DEPARTMENT_OTHER): Admitting: Internal Medicine

## 2024-05-20 ENCOUNTER — Inpatient Hospital Stay: Attending: Internal Medicine

## 2024-05-20 DIAGNOSIS — Z7982 Long term (current) use of aspirin: Secondary | ICD-10-CM | POA: Diagnosis not present

## 2024-05-20 DIAGNOSIS — Z8042 Family history of malignant neoplasm of prostate: Secondary | ICD-10-CM | POA: Insufficient documentation

## 2024-05-20 DIAGNOSIS — D509 Iron deficiency anemia, unspecified: Secondary | ICD-10-CM | POA: Insufficient documentation

## 2024-05-20 DIAGNOSIS — D649 Anemia, unspecified: Secondary | ICD-10-CM | POA: Diagnosis not present

## 2024-05-20 DIAGNOSIS — Z87891 Personal history of nicotine dependence: Secondary | ICD-10-CM | POA: Diagnosis not present

## 2024-05-20 DIAGNOSIS — Z8052 Family history of malignant neoplasm of bladder: Secondary | ICD-10-CM | POA: Diagnosis not present

## 2024-05-20 DIAGNOSIS — K5521 Angiodysplasia of colon with hemorrhage: Secondary | ICD-10-CM | POA: Insufficient documentation

## 2024-05-20 DIAGNOSIS — I739 Peripheral vascular disease, unspecified: Secondary | ICD-10-CM | POA: Insufficient documentation

## 2024-05-20 LAB — CBC WITH DIFFERENTIAL (CANCER CENTER ONLY)
Abs Immature Granulocytes: 0.12 K/uL — ABNORMAL HIGH (ref 0.00–0.07)
Basophils Absolute: 0 K/uL (ref 0.0–0.1)
Basophils Relative: 0 %
Eosinophils Absolute: 0 K/uL (ref 0.0–0.5)
Eosinophils Relative: 0 %
HCT: 46 % (ref 39.0–52.0)
Hemoglobin: 15.5 g/dL (ref 13.0–17.0)
Immature Granulocytes: 1 %
Lymphocytes Relative: 11 %
Lymphs Abs: 1.2 K/uL (ref 0.7–4.0)
MCH: 29.1 pg (ref 26.0–34.0)
MCHC: 33.7 g/dL (ref 30.0–36.0)
MCV: 86.5 fL (ref 80.0–100.0)
Monocytes Absolute: 1.1 K/uL — ABNORMAL HIGH (ref 0.1–1.0)
Monocytes Relative: 10 %
Neutro Abs: 8.5 K/uL — ABNORMAL HIGH (ref 1.7–7.7)
Neutrophils Relative %: 78 %
Platelet Count: 205 K/uL (ref 150–400)
RBC: 5.32 MIL/uL (ref 4.22–5.81)
RDW: 14.3 % (ref 11.5–15.5)
WBC Count: 11 K/uL — ABNORMAL HIGH (ref 4.0–10.5)
nRBC: 0 % (ref 0.0–0.2)

## 2024-05-20 LAB — FERRITIN: Ferritin: 55 ng/mL (ref 24–336)

## 2024-05-20 LAB — IRON AND TIBC
Iron: 80 ug/dL (ref 45–182)
Saturation Ratios: 28 % (ref 17.9–39.5)
TIBC: 291 ug/dL (ref 250–450)
UIBC: 211 ug/dL

## 2024-05-20 NOTE — Assessment & Plan Note (Addendum)
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 Cannot tolerate PO iron  [diarrhea].   # HOLD venofe today- monitor for now.  # Etiology: History of AVMs- [Dr. Onita of GI.  Colonoscopy done on 04/13/2023 showed 14 mm polypoid lesion at the ileocecal valve.  Had difficulty intubating TI.  One 1 to 2 mm polyp in the cecum.  11 to 12 mm polyp in ascending colon.  One 2 to 3 mm polyp in the descending colon.  Pathology showed tubular adenoma. Upper endoscopy - Barrett's esophagus. VCE performed in July 2024 reportedly revealed multiple sites of bleeding in the proximal SB. A subsequent Push Enteroscopy performed the next day revealed several angioectasias in the duodenum and jejunum that were treated.  10/25/2023 - readmitted with GI bleed.  Hemoglobin of 5.4. s/p 2 units PRBC. Underwent capsule endoscopy which showed bleeding cecal polyp;  Dr. Toribio Clas, [facility].  His GI bleeding is thought likely secondary to intermittent bleeding episodes from small bowel angioectasias.  At this time, he does not think any further intervention is needed based on his age and other comorbidities.  Xarelto  has been discontinued.  Switched to Plavix  by Dr. Marea which has less of a risk of GI bleed.  # PVD-currently on aspirin  and Plavix  [Dr. Dew]. Stable.     # DISPOSITION: # HOLD venofer  today # Follow up in 4 months- MD; labs- cbc/cmp; iron  studies; ferritin; possible venofer - Dr.B

## 2024-05-20 NOTE — Progress Notes (Signed)
 Fatigue/weakness: YES-LEGS Dyspena: NO Light headedness: NO Blood in stool: NO  Last week was seen at walk in for lt arm strain. Placed on prednisone  for a week.

## 2024-05-20 NOTE — Progress Notes (Signed)
 HOLD venofer  today per MD

## 2024-05-20 NOTE — Progress Notes (Signed)
 Bryan Welch  Patient Care Team: Bryan Bryan BIRCH, Bryan Welch as PCP - General (Internal Medicine) Bryan Cindy SAUNDERS, Bryan Welch as Consulting Physician (Oncology)  CHIEF COMPLAINTS/PURPOSE OF CONSULTATION: symptomatic anemia   Latest Reference Range & Units 01/18/24 12:52  Iron  45 - 182 ug/dL 23 (L)  UIBC ug/dL 620  TIBC 749 - 549 ug/dL 597  Saturation Ratios 17.9 - 39.5 % 6 (L)  Ferritin 24 - 336 ng/mL 8 (L)  (L): Data is abnormally low   HISTORY OF PRESENTING ILLNESS:  Patient ambulating-independently/with cane. Alone   Bryan Welch 88 y.o.  male pleasant patient with a anemia secondary to GI bleed chronic is here for follow-up.  Patient s/p venofer . Last week was seen at walk in for lt arm strain. Placed on prednisone  for a week.   Overall patient notes to have significant improvement of his energy levels.  Denies any blood in stools or black-colored stools.  Denies any nausea or vomiting.  He is not on oral iron  because of intolerance.  Review of Systems  Constitutional:  Positive for malaise/fatigue. Negative for chills, diaphoresis, fever and weight loss.  HENT:  Negative for nosebleeds and sore throat.   Eyes:  Negative for double vision.  Respiratory:  Negative for cough, hemoptysis, sputum production, shortness of breath and wheezing.   Cardiovascular:  Negative for chest pain, palpitations, orthopnea and leg swelling.  Gastrointestinal:  Negative for abdominal pain, blood in stool, constipation, diarrhea, heartburn, melena, nausea and vomiting.  Genitourinary:  Negative for dysuria, frequency and urgency.  Musculoskeletal:  Positive for joint pain. Negative for back pain.  Skin: Negative.  Negative for itching and rash.  Neurological:  Negative for dizziness, tingling, focal weakness, weakness and headaches.  Endo/Heme/Allergies:  Does not bruise/bleed easily.  Psychiatric/Behavioral:  Negative for depression. The patient is not nervous/anxious and  does not have insomnia.     MEDICAL HISTORY:  Past Medical History:  Diagnosis Date   Arthritis    Toes   Coronary artery disease    History of hiatal hernia    Hyperlipidemia    Hypertension    Kidney disorder    right kidney doesn't work   Presence of stent in artery    4 in legs, 1 in neck   Vascular abnormality    Wears dentures    full upper and lower    SURGICAL HISTORY: Past Surgical History:  Procedure Laterality Date   AMPUTATION TOE Right 09/16/2021   Procedure: AMPUTATION TOE;  Surgeon: Lennie Barter, DPM;  Location: [facility];  Service: Podiatry;  Laterality: Right;   BACK SURGERY     CAROTID STENT Right    CATARACT EXTRACTION W/PHACO Left 06/17/2019   Procedure: CATARACT EXTRACTION PHACO AND INTRAOCULAR LENS PLACEMENT (IOC) LEFT;  Surgeon: Myrna Adine Anes, Bryan Welch;  Location: [facility];  Service: Ophthalmology;  Laterality: Left;   CATARACT EXTRACTION W/PHACO Right 07/08/2019   Procedure: CATARACT EXTRACTION PHACO AND INTRAOCULAR LENS PLACEMENT (IOC) right  00:39.2  10.8%  5.02  ;  Surgeon: Myrna Adine Anes, Bryan Welch;  Location: [facility];  Service: Ophthalmology;  Laterality: Right;   COLONOSCOPY WITH PROPOFOL  N/A 04/13/2023   Procedure: COLONOSCOPY WITH PROPOFOL ;  Surgeon: Onita Elspeth Sharper, DO;  Location: [facility];  Service: Gastroenterology;  Laterality: N/A;   ENTEROSCOPY N/A 05/10/2023   Procedure: ENTEROSCOPY;  Surgeon: Jinny Carmine, Bryan Welch;  Location: [facility];  Service: Endoscopy;  Laterality: N/A;   ENTEROSCOPY N/A 10/27/2023   Procedure: ENTEROSCOPY;  Surgeon: Unk Corinn Skiff, Bryan Welch;  Location: [facility] ENDOSCOPY;  Service: Gastroenterology;  Laterality: N/A;   ESOPHAGOGASTRODUODENOSCOPY (EGD) WITH PROPOFOL  N/A 04/13/2023   Procedure: ESOPHAGOGASTRODUODENOSCOPY (EGD) WITH PROPOFOL ;  Surgeon: Onita Elspeth Sharper, DO;  Location: [facility] ENDOSCOPY;  Service: Gastroenterology;  Laterality: N/A;   EYE SURGERY     GIVENS CAPSULE  STUDY N/A 05/09/2023   Procedure: GIVENS CAPSULE STUDY;  Surgeon: Jinny Carmine, Bryan Welch;  Location: [facility] ENDOSCOPY;  Service: Endoscopy;  Laterality: N/A;   GIVENS CAPSULE STUDY  10/27/2023   Procedure: GIVENS CAPSULE STUDY;  Surgeon: Unk Corinn Skiff, Bryan Welch;  Location[facility];  Service: Gastroenterology;;   HOT HEMOSTASIS  05/10/2023   Procedure: HOT HEMOSTASIS (ARGON PLASMA COAGULATION/BICAP);  Surgeon: Jinny Carmine, Bryan Welch;  Location: [facility] ENDOSCOPY;  Service: Endoscopy;;   LEG SURGERY Bilateral    stent placement   LOWER EXTREMITY ANGIOGRAPHY Right 08/19/2021   Procedure: LOWER EXTREMITY ANGIOGRAPHY;  Surgeon: Marea Selinda RAMAN, Bryan Welch;  Location: [facility];  Service: Cardiovascular;  Laterality: Right;   LOWER EXTREMITY ANGIOGRAPHY Right 08/09/2023   Procedure: Lower Extremity Angiography;  Surgeon: Marea Selinda RAMAN, Bryan Welch;  Location: [facility];  Service: Cardiovascular;  Laterality: Right;   LOWER EXTREMITY ANGIOGRAPHY Right 08/10/2023   Procedure: Lower Extremity Angiography;  Surgeon: Marea Selinda RAMAN, Bryan Welch;  Location: [facility];  Service: Cardiovascular;  Laterality: Right;   PERIPHERAL VASCULAR BALLOON ANGIOPLASTY Right 09/21/2017   Procedure: PERIPHERAL VASCULAR BALLOON ANGIOPLASTY;  Surgeon: Marea Selinda RAMAN, Bryan Welch;  Location: [facility];  Service: Cardiovascular;  Laterality: Right;   REVERSE SHOULDER ARTHROPLASTY Right 11/26/2019   Procedure: REVERSE SHOULDER ARTHROPLASTY;  Surgeon: Edie Bryan PARAS, Bryan Welch;  Location: [facility] ORS;  Service: Orthopedics;  Laterality: Right;   SUBMUCOSAL TATTOO INJECTION  10/27/2023   Procedure: SUBMUCOSAL TATTOO INJECTION;  Surgeon: Unk Corinn Skiff, Bryan Welch;  Location: [facility] ENDOSCOPY;  Service: Gastroenterology;;    SOCIAL HISTORY: Social History   Socioeconomic History   Marital status: Married    Spouse name: Not on file   Number of children: Not on file   Years of education: Not on file   Highest education level: Not on file  Occupational  History   Not on file  Tobacco Use   Smoking status: Former    Current packs/day: 0.00    Types: Cigarettes    Quit date: 2001    Years since quitting: 24.5   Smokeless tobacco: Never  Vaping Use   Vaping status: Never Used  Substance and Sexual Activity   Alcohol use: Not Currently   Drug use: No   Sexual activity: Not on file  Other Topics Concern   Not on file  Social History Narrative   Lives with wife, Donny. No indoor pets.   Social Drivers of Corporate investment banker Strain: Low Risk  (12/08/2023)   Received from [facility]   Overall Financial Resource Strain (CARDIA)    Difficulty of Paying Living Expenses: Not hard at all  Food Insecurity: Unknown (12/08/2023)   Received from [facility]   Hunger Vital Sign    Within the past 12 months, you worried that your food would run out before you got the money to buy more.: Never true    Ran Out of Food in the Last Year: Not on file  Transportation Needs: No Transportation Needs (12/08/2023)   Received from [facility] - Transportation    In the past 12 months,  has lack of transportation kept you from medical appointments or from getting medications?: No    Lack of Transportation (Non-Medical): No  Physical Activity: Not on file  Stress: Not on file  Social Connections: Not on file  Intimate Partner Violence: Not At Risk (10/27/2023)   Humiliation, Afraid, Rape, and Kick questionnaire    Fear of Current or Ex-Partner: No    Emotionally Abused: No    Physically Abused: No    Sexually Abused: No    FAMILY HISTORY: Family History  Problem Relation Age of Onset   Bladder Cancer Mother    Prostate cancer Father    Prostate cancer Brother    Diabetes Maternal Grandfather     ALLERGIES:  is allergic to codeine.  MEDICATIONS:  Current Outpatient Medications  Medication Sig Dispense Refill   acetaminophen  (TYLENOL ) 500 MG tablet Take 1,000 mg by mouth  at bedtime.     ALPRAZolam  (XANAX ) 0.25 MG tablet Take 0.25 mg by mouth at bedtime.     aspirin  EC 81 MG tablet Take 81 mg daily by mouth.      Cholecalciferol  25 MCG (1000 UT) capsule Take 2,000 Units by mouth daily.     clopidogrel  (PLAVIX ) 75 MG tablet TAKE 1 TABLET BY MOUTH EVERY DAY 90 tablet 2   gabapentin  (NEURONTIN ) 300 MG capsule Take 300 mg by mouth 2 (two) times daily.     hydrochlorothiazide  (HYDRODIURIL ) 25 MG tablet Take 1 tablet by mouth daily.     Multiple Vitamins-Minerals (PRESERVISION AREDS 2 PO) Take 1 tablet by mouth 2 (two) times daily.     omeprazole (PRILOSEC) 20 MG capsule Take 20 mg by mouth at bedtime.     pantoprazole  (PROTONIX ) 40 MG tablet Take by mouth.     potassium chloride  (KLOR-CON ) 10 MEQ tablet Take 10 mEq by mouth daily.     pravastatin  (PRAVACHOL ) 20 MG tablet Take 20 mg by mouth at bedtime.     tamsulosin  (FLOMAX ) 0.4 MG CAPS capsule Take 0.4 mg by mouth daily.  3   No current facility-administered medications for this visit.    PHYSICAL EXAMINATION:   Vitals:   05/20/24 1313 05/20/24 1334  BP: (!) 163/70 (!) 156/85  Pulse: 74   Resp: 16   Temp: (!) 97 F (36.1 C)   SpO2: 97%    Filed Weights   05/20/24 1313  Weight: 141 lb 9.6 oz (64.2 kg)    Physical Exam Vitals and nursing Welch reviewed.  HENT:     Head: Normocephalic and atraumatic.     Mouth/Throat:     Pharynx: Oropharynx is clear.  Eyes:     Extraocular Movements: Extraocular movements intact.     Pupils: Pupils are equal, round, and reactive to light.  Cardiovascular:     Rate and Rhythm: Normal rate and regular rhythm.  Pulmonary:     Comments: Decreased breath sounds bilaterally.  Abdominal:     Palpations: Abdomen is soft.  Musculoskeletal:        General: Normal range of motion.     Cervical back: Normal range of motion.  Skin:    General: Skin is warm.  Neurological:     General: No focal deficit present.     Mental Status: He is alert and oriented to person,  place, and time.  Psychiatric:        Behavior: Behavior normal.        Judgment: Judgment normal.     LABORATORY DATA:  I have reviewed the  data as listed Lab Results  Component Value Date   WBC 11.0 (H) 05/20/2024   HGB 15.5 05/20/2024   HCT 46.0 05/20/2024   MCV 86.5 05/20/2024   PLT 205 05/20/2024   Recent Labs    07/04/23 1212 08/09/23 0733 10/25/23 0734 10/26/23 0423 10/27/23 0500 10/28/23 1251  NA 136   < > 139 139 139 135  K 3.6   < > 3.4* 3.5 3.6 3.9  CL 99   < > 106 108 107 106  CO2 26   < > 19* 20* 22 22  GLUCOSE 99   < > 151* 105* 86 126*  BUN 30*   < > 62* 52* 36* 31*  CREATININE 1.53*   < > 1.63* 1.55* 1.27* 1.22  CALCIUM  9.2   < > 8.5* 8.0* 8.4* 8.2*  GFRNONAA 45*   < > 41* 44* 55* 58*  PROT 7.4  --  5.9* 5.3*  --   --   ALBUMIN 4.0  --  3.2* 3.0*  --   --   AST 21  --  21 16  --   --   ALT 17  --  12 12  --   --   ALKPHOS 75  --  47 45  --   --   BILITOT 0.6  --  0.5 0.6  --   --   BILIDIR  --   --  <0.1  --   --   --   IBILI  --   --  NOT CALCULATED  --   --   --    < > = values in this interval not displayed.    RADIOGRAPHIC STUDIES: I have personally reviewed the radiological images as listed and agreed with the findings in the report. No results found.   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 Cannot tolerate PO iron  [diarrhea].   # HOLD venofe today- monitor for now.  # Etiology: History of AVMs- [Dr. Onita of GI.  Colonoscopy done on 04/13/2023 showed 14 mm polypoid lesion at the ileocecal valve.  Had difficulty intubating TI.  One 1 to 2 mm polyp in the cecum.  11 to 12 mm polyp in ascending colon.  One 2 to 3 mm polyp in the descending colon.  Pathology showed tubular adenoma. Upper endoscopy - Barrett's esophagus. VCE performed in July 2024 reportedly revealed multiple sites of bleeding in the proximal SB. A subsequent Push Enteroscopy performed the next day revealed several  angioectasias in the duodenum and jejunum that were treated.  10/25/2023 - readmitted with GI bleed.  Hemoglobin of 5.4. s/p 2 units PRBC. Underwent capsule endoscopy which showed bleeding cecal polyp;  Dr. Toribio Clas, [facility].  His GI bleeding is thought likely secondary to intermittent bleeding episodes from small bowel angioectasias.  At this time, he does not think any further intervention is needed based on his age and other comorbidities.  Xarelto  has been discontinued.  Switched to Plavix  by Dr. Marea which has less of a risk of GI bleed.  # PVD-currently on aspirin  and Plavix  [Dr. Dew]. Stable.     # DISPOSITION: # HOLD venofer  today # Follow up in 4 months- Bryan Welch; labs- cbc/cmp; iron  studies; ferritin; possible venofer - Dr.B     Above plan of care was discussed with patient/family in detail.  My contact information was given to the patient/family.       Adolphus Hanf R  Rennie, Bryan Welch 05/20/2024 1:58 PM

## 2024-08-02 ENCOUNTER — Encounter (INDEPENDENT_AMBULATORY_CARE_PROVIDER_SITE_OTHER): Payer: Medicare Other

## 2024-08-02 ENCOUNTER — Ambulatory Visit (INDEPENDENT_AMBULATORY_CARE_PROVIDER_SITE_OTHER): Payer: Medicare Other | Admitting: Vascular Surgery

## 2024-08-06 ENCOUNTER — Other Ambulatory Visit (INDEPENDENT_AMBULATORY_CARE_PROVIDER_SITE_OTHER): Payer: Self-pay | Admitting: Vascular Surgery

## 2024-08-06 ENCOUNTER — Other Ambulatory Visit (INDEPENDENT_AMBULATORY_CARE_PROVIDER_SITE_OTHER)

## 2024-08-06 ENCOUNTER — Ambulatory Visit (INDEPENDENT_AMBULATORY_CARE_PROVIDER_SITE_OTHER)

## 2024-08-06 ENCOUNTER — Ambulatory Visit (INDEPENDENT_AMBULATORY_CARE_PROVIDER_SITE_OTHER): Admitting: Vascular Surgery

## 2024-08-06 ENCOUNTER — Encounter (INDEPENDENT_AMBULATORY_CARE_PROVIDER_SITE_OTHER): Payer: Self-pay | Admitting: Vascular Surgery

## 2024-08-06 VITALS — BP 145/72 | HR 66 | Ht 66.0 in | Wt 140.1 lb

## 2024-08-06 DIAGNOSIS — I6523 Occlusion and stenosis of bilateral carotid arteries: Secondary | ICD-10-CM | POA: Diagnosis not present

## 2024-08-06 DIAGNOSIS — I10 Essential (primary) hypertension: Secondary | ICD-10-CM | POA: Diagnosis not present

## 2024-08-06 DIAGNOSIS — R6889 Other general symptoms and signs: Secondary | ICD-10-CM

## 2024-08-06 DIAGNOSIS — I70211 Atherosclerosis of native arteries of extremities with intermittent claudication, right leg: Secondary | ICD-10-CM

## 2024-08-06 DIAGNOSIS — I7143 Infrarenal abdominal aortic aneurysm, without rupture: Secondary | ICD-10-CM | POA: Diagnosis not present

## 2024-08-06 DIAGNOSIS — E785 Hyperlipidemia, unspecified: Secondary | ICD-10-CM

## 2024-08-06 DIAGNOSIS — I7025 Atherosclerosis of native arteries of other extremities with ulceration: Secondary | ICD-10-CM | POA: Diagnosis not present

## 2024-08-06 DIAGNOSIS — R200 Anesthesia of skin: Secondary | ICD-10-CM

## 2024-08-06 NOTE — Assessment & Plan Note (Signed)
 His ABIs today have dropped somewhat significantly.  His ABIs are 0.50 on the right and 0.54 on the left.  His toe pressures are 49 on the right and 39 on the left.  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 His symptoms are definitely worse.  He states that he is not necessarily a fan of any procedures but if we have to do them he is willing to do them.  I have offered him a 10-month shortened interval follow-up to see if his symptoms worsen and see where his numbers are.  We may have to consider intervention following this if his studies continue to decline.

## 2024-08-06 NOTE — Assessment & Plan Note (Signed)
 Duplex today shows a maximal diameter of the aorta 4.4 cm.  This is a fairly significant jump since his study 6 months ago showed a measurement of 3.8 to 3.9 cm in diameter.  No current aneurysm related symptoms, but I am going to shorten his follow-up interval to 3 months to monitor for rapid growth.

## 2024-08-06 NOTE — Progress Notes (Signed)
 MRN : [id number]  Bryan Welch is a 88 y.o. ([birth year]) male who presents with chief complaint of No chief complaint on file. [name]  History of Present Illness: Patient returns today in follow up of multiple vascular issues.  His legs are bothering him a little bit more than at his last visit.  No open wounds or rest pain.  Does have significant claudication symptoms bilaterally.  His legs do get tired and give out on him.  His ABIs today have dropped somewhat significantly.  His ABIs are 0.50 on the right and 0.54 on the left.  His toe pressures are 49 on the right and 39 on the left.  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 He is also followed for his aneurysm.  No current aneurysm related symptoms. Specifically, the patient denies new back or abdominal pain, or signs of peripheral embolization.  Duplex today shows a maximal diameter of the aorta 4.4 cm.  This is a fairly significant jump since his study 6 months ago showed a measurement of 3.8 to 3.9 cm in diameter.  He is also about a decade status post right carotid endarterectomy.  No current focal neurologic symptoms. Specifically, the patient denies amaurosis fugax, speech or swallowing difficulties, or arm or leg weakness or numbness   Current Outpatient Medications  Medication Sig Dispense Refill   acetaminophen  (TYLENOL ) 500 MG tablet Take 1,000 mg by mouth at bedtime.     ALPRAZolam  (XANAX ) 0.25 MG tablet Take 0.25 mg by mouth at bedtime.     aspirin  EC 81 MG tablet Take 81 mg daily by mouth.      Cholecalciferol  25 MCG (1000 UT) capsule Take 2,000 Units by mouth daily.     clopidogrel  (PLAVIX ) 75 MG tablet TAKE 1 TABLET BY MOUTH EVERY DAY 90 tablet 2   gabapentin  (NEURONTIN ) 300 MG capsule Take 300 mg by mouth 2 (two) times daily.     hydrochlorothiazide  (HYDRODIURIL ) 25 MG tablet Take 1 tablet by mouth daily.     Multiple Vitamins-Minerals (PRESERVISION AREDS 2 PO)  Take 1 tablet by mouth 2 (two) times daily.     omeprazole (PRILOSEC) 20 MG capsule Take 20 mg by mouth at bedtime.     pantoprazole  (PROTONIX ) 40 MG tablet Take by mouth.     potassium chloride  (KLOR-CON ) 10 MEQ tablet Take 10 mEq by mouth daily.     pravastatin  (PRAVACHOL ) 20 MG tablet Take 20 mg by mouth at bedtime.     tamsulosin  (FLOMAX ) 0.4 MG CAPS capsule Take 0.4 mg by mouth daily.  3   No current facility-administered medications for this visit.    Past Medical History:  Diagnosis Date   Arthritis    Toes   Coronary artery disease    History of hiatal hernia    Hyperlipidemia    Hypertension    Kidney disorder    right kidney doesn't work   Presence of stent in artery    4 in legs, 1 in neck   Vascular abnormality    Wears dentures    full upper and lower    Past Surgical History:  Procedure Laterality Date   AMPUTATION TOE Right 09/16/2021   Procedure: AMPUTATION TOE;  Surgeon: Lennie Barter, DPM;  Location: [facility];  Service: Podiatry;  Laterality: Right;   BACK SURGERY     CAROTID STENT Right    CATARACT EXTRACTION W/PHACO Left 06/17/2019   Procedure:  CATARACT EXTRACTION PHACO AND INTRAOCULAR LENS PLACEMENT (IOC) LEFT;  Surgeon: Myrna Adine Anes, MD;  Location: [facility];  Service: Ophthalmology;  Laterality: Left;   CATARACT EXTRACTION W/PHACO Right 07/08/2019   Procedure: CATARACT EXTRACTION PHACO AND INTRAOCULAR LENS PLACEMENT (IOC) right  00:39.2  10.8%  5.02  ;  Surgeon: Myrna Adine Anes, MD;  Location: [facility];  Service: Ophthalmology;  Laterality: Right;   COLONOSCOPY WITH PROPOFOL  N/A 04/13/2023   Procedure: COLONOSCOPY WITH PROPOFOL ;  Surgeon: Onita Elspeth Sharper, DO;  Location: [facility] ENDOSCOPY;  Service: Gastroenterology;  Laterality: N/A;   ENTEROSCOPY N/A 05/10/2023   Procedure: ENTEROSCOPY;  Surgeon: Jinny Carmine, MD;  Location: [facility] ENDOSCOPY;  Service: Endoscopy;  Laterality: N/A;   ENTEROSCOPY N/A 10/27/2023    Procedure: ENTEROSCOPY;  Surgeon: Unk Corinn Skiff, MD;  Location: [facility] ENDOSCOPY;  Service: Gastroenterology;  Laterality: N/A;   ESOPHAGOGASTRODUODENOSCOPY (EGD) WITH PROPOFOL  N/A 04/13/2023   Procedure: ESOPHAGOGASTRODUODENOSCOPY (EGD) WITH PROPOFOL ;  Surgeon: Onita Elspeth Sharper, DO;  Location: [facility] ENDOSCOPY;  Service: Gastroenterology;  Laterality: N/A;   EYE SURGERY     GIVENS CAPSULE STUDY N/A 05/09/2023   Procedure: GIVENS CAPSULE STUDY;  Surgeon: Jinny Carmine, MD;  Location: [facility] ENDOSCOPY;  Service: Endoscopy;  Laterality: N/A;   GIVENS CAPSULE STUDY  10/27/2023   Procedure: GIVENS CAPSULE STUDY;  Surgeon: Unk Corinn Skiff, MD;  Location: [facility] ENDOSCOPY;  Service: Gastroenterology;;   HOT HEMOSTASIS  05/10/2023   Procedure: HOT HEMOSTASIS (ARGON PLASMA COAGULATION/BICAP);  Surgeon: Jinny Carmine, MD;  Location: [facility] ENDOSCOPY;  Service: Endoscopy;;   LEG SURGERY Bilateral    stent placement   LOWER EXTREMITY ANGIOGRAPHY Right 08/19/2021   Procedure: LOWER EXTREMITY ANGIOGRAPHY;  Surgeon: Marea Selinda RAMAN, MD;  Location: [facility] INVASIVE CV LAB;  Service: Cardiovascular;  Laterality: Right;   LOWER EXTREMITY ANGIOGRAPHY Right 08/09/2023   Procedure: Lower Extremity Angiography;  Surgeon: Marea Selinda RAMAN, MD;  Location: [facility] INVASIVE CV LAB;  Service: Cardiovascular;  Laterality: Right;   LOWER EXTREMITY ANGIOGRAPHY Right 08/10/2023   Procedure: Lower Extremity Angiography;  Surgeon: Marea Selinda RAMAN, MD;  Location: [facility] INVASIVE CV LAB;  Service: Cardiovascular;  Laterality: Right;   PERIPHERAL VASCULAR BALLOON ANGIOPLASTY Right 09/21/2017   Procedure: PERIPHERAL VASCULAR BALLOON ANGIOPLASTY;  Surgeon: Marea Selinda RAMAN, MD;  Location: [facility] INVASIVE CV LAB;  Service: Cardiovascular;  Laterality: Right;   REVERSE SHOULDER ARTHROPLASTY Right 11/26/2019   Procedure: REVERSE SHOULDER ARTHROPLASTY;  Surgeon: Edie Norleen PARAS, MD;  Location: [facility] ORS;  Service: Orthopedics;  Laterality: Right;   SUBMUCOSAL  TATTOO INJECTION  10/27/2023   Procedure: SUBMUCOSAL TATTOO INJECTION;  Surgeon: Unk Corinn Skiff, MD;  Location: [facility] ENDOSCOPY;  Service: Gastroenterology;;     Social History   Tobacco Use   Smoking status: Former    Current packs/day: 0.00    Types: Cigarettes    Quit date: 2001    Years since quitting: 24.7   Smokeless tobacco: Never  Vaping Use   Vaping status: Never Used  Substance Use Topics   Alcohol use: Not Currently   Drug use: No      Family History  Problem Relation Age of Onset   Bladder Cancer Mother    Prostate cancer Father    Prostate cancer Brother    Diabetes Maternal Grandfather      Allergies  Allergen Reactions   Codeine Itching     REVIEW OF SYSTEMS (Negative unless checked)   Constitutional: [] Weight loss  [] Fever  [] Chills Cardiac: [] Chest pain   [] Chest  pressure   [] Palpitations   [] Shortness of breath when laying flat   [] Shortness of breath at rest   [x] Shortness of breath with exertion. Vascular:  [x] Pain in legs with walking   [] Pain in legs at rest   [] Pain in legs when laying flat   [x] Claudication   [] Pain in feet when walking  [] Pain in feet at rest  [] Pain in feet when laying flat   [] History of DVT   [] Phlebitis   [] Swelling in legs   [] Varicose veins   [x] Non-healing ulcers Pulmonary:   [] Uses home oxygen   [] Productive cough   [] Hemoptysis   [] Wheeze  [] COPD   [] Asthma Neurologic:  [] Dizziness  [] Blackouts   [] Seizures   [] History of stroke   [] History of TIA  [] Aphasia   [] Temporary blindness   [] Dysphagia   [] Weakness or numbness in arms   [] Weakness or numbness in legs Musculoskeletal:  [x] Arthritis   [] Joint swelling   [x] Joint pain   [] Low back pain Hematologic:  [] Easy bruising  [] Easy bleeding   [] Hypercoagulable state   [] Anemic   Gastrointestinal:  [] Blood in stool   [] Vomiting blood  [] Gastroesophageal reflux/heartburn   [] Abdominal pain Genitourinary:  [x] Chronic kidney disease   [] Difficult urination  [] Frequent  urination  [] Burning with urination   [] Hematuria Skin:  [] Rashes   [x] Ulcers   [x] Wounds Psychological:  [] History of anxiety   []  History of major depression.  Physical Examination  BP (!) 145/72   Pulse 66   Ht 5' 6 (1.676 m)   Wt 140 lb 2 oz (63.6 kg)   BMI 22.62 kg/m  Gen:  WD/WN, NAD. Appears younger than stated age. Head: [place]/AT, No temporalis wasting. Ear/Nose/Throat: Hearing grossly intact, nares w/o erythema or drainage Eyes: Conjunctiva clear. Sclera non-icteric Neck: Supple.  Trachea midline Pulmonary:  Good air movement, no use of accessory muscles.  Cardiac: irregular Vascular:  Vessel Right Left  Radial Palpable Palpable                          PT Not Palpable Not Palpable  DP 1+ Palpable Trace Palpable   Gastrointestinal: soft, non-tender/non-distended. No guarding/reflex.  Musculoskeletal: M/S 5/5 throughout.  No deformity or atrophy. No edema. Neurologic: Sensation grossly intact in extremities.  Symmetrical.  Speech is fluent.  Psychiatric: Judgment intact, Mood & affect appropriate for pt's clinical situation. Dermatologic: No rashes or ulcers noted.  No cellulitis or open wounds.      Labs Recent Results (from the past 2160 hours)  Ferritin     Status: None   Collection Time: 05/20/24  1:19 PM  Result Value Ref Range   Ferritin 55 24 - 336 ng/mL    Comment: Performed at [facility], [address] [place], [place] [address]  Iron  and TIBC     Status: None   Collection Time: 05/20/24  1:19 PM  Result Value Ref Range   Iron  80 45 - 182 ug/dL   TIBC 708 749 - 549 ug/dL   Saturation Ratios 28 17.9 - 39.5 %   UIBC 211 ug/dL    Comment: Performed at [facility], [address] [place], [place] [address]  CBC with Differential (Cancer Center Only)     Status: Abnormal   Collection Time: 05/20/24  1:19 PM  Result Value Ref Range   WBC Count 11.0 (H) 4.0 - 10.5 K/uL   RBC 5.32 4.22 - 5.81 MIL/uL   Hemoglobin 15.5 13.0 -  17.0 g/dL   HCT 53.9 60.9 - 47.9 %   MCV 86.5 80.0 - 100.0 fL   MCH 29.1 26.0 - 34.0 pg   MCHC 33.7 30.0 - 36.0 g/dL   RDW 85.6 88.4 - 84.4 %   Platelet Count 205 150 - 400 K/uL   nRBC 0.0 0.0 - 0.2 %   Neutrophils Relative % 78 %   Neutro Abs 8.5 (H) 1.7 - 7.7 K/uL   Lymphocytes Relative 11 %   Lymphs Abs 1.2 0.7 - 4.0 K/uL   Monocytes Relative 10 %   Monocytes Absolute 1.1 (H) 0.1 - 1.0 K/uL   Eosinophils Relative 0 %   Eosinophils Absolute 0.0 0.0 - 0.5 K/uL   Basophils Relative 0 %   Basophils Absolute 0.0 0.0 - 0.1 K/uL   Immature Granulocytes 1 %   Abs Immature Granulocytes 0.12 (H) 0.00 - 0.07 K/uL    Comment: Performed at [facility], [address], [place], [place] [address]    Radiology No results found.  Assessment/Plan  Atherosclerosis of native arteries of extremity with intermittent claudication His ABIs today have dropped somewhat significantly.  His ABIs are 0.50 on the right and 0.54 on the left.  His toe pressures are 49 on the right and 39 on the left.  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 His symptoms are definitely worse.  He states that he is not necessarily a fan of any procedures but if we have to do them he is willing to do them.  I have offered him a 40-month shortened interval follow-up to see if his symptoms worsen and see where his numbers are.  We may have to consider intervention following this if his studies continue to decline.  AAA (abdominal aortic aneurysm) without rupture Duplex today shows a maximal diameter of the aorta 4.4 cm.  This is a fairly significant jump since his study 6 months ago showed a measurement of 3.8 to 3.9 cm in diameter.  No current aneurysm related symptoms, but I am going to shorten his follow-up interval to 3 months to monitor for rapid growth.  Carotid stenosis Carotid duplex last year shows stable 1 to 39% ICA stenosis bilaterally.  Almost a decade  status post right carotid endarterectomy.  On Xarelto  and aspirin .  Continue to follow annually, but hasn't been checked on last couple visits. Check on his next return visit.   Hyperlipidemia lipid control important in reducing the progression of atherosclerotic disease. Continue statin therapy     Essential hypertension, benign blood pressure control important in reducing the progression of atherosclerotic disease. On appropriate oral medications.   Selinda Gu, MD  08/06/2024 9:36 AM    This note was created with Dragon medical transcription system.  Any errors from dictation are purely unintentional

## 2024-08-09 LAB — VAS US ABI WITH/WO TBI
Left ABI: 0.54
Right ABI: 0.5

## 2024-08-28 NOTE — H&P (View-Only) (Signed)
 ORTHOPAEDIC SURGERY- CLINIC NOTE  Chief Complaint: Right elbow pain/ cyst  History of Present Illness: History of Present Illness Bryan Welch is an 88 year old male who presents with concerns about his elbow.  He has been experiencing a persistent issue with his elbow for the past three to four years. The condition is non-painful but unsightly. Previous attempts to drain the area with a needle were unsuccessful, and he has tried compression, heat, and ice without success.  He denies any numbness or tingling in his fingers. He is concerned about the functional impact of the condition, as it is 'getting in the way.'  His past medical history includes vascular issues for which he takes Plavix  due to having stents in his leg and neck. He mentions that he can stop Plavix  for a few days before procedures, as he has done in the past. He also has a history of a hiatal hernia and a non-functioning kidney that 'just dried up' about ten years ago.  Prior medical records from [place], [place] were reviewed.    PMHx, PSurgHx, Fam Hx, Soc Hx, Meds, Allergies: Past Medical History:  Diagnosis Date  . AAA (abdominal aortic aneurysm) ()   . Cataracts, bilateral   . Chickenpox   . Confusion, unspecified 04/07/2014  . COPD (chronic obstructive pulmonary disease) (CMS/HHS-HCC)   . Dog bite of arm 02/25/14  . Elevated PSA, less than 10 ng/ml   . Erectile dysfunction   . Facial numbness 04/07/2014  . GERD (gastroesophageal reflux disease)   . History of colon polyps   . History of kidney stones   . Hyperlipidemia   . Hypertension   . Kidney stones   . Peripheral vascular disease ()   . Psoriasis     Past Surgical History:  Procedure Laterality Date  . CORONARY ANGIOPLASTY  2014  . REMOVAL EXTERNAL FIXATOR ANKLE FOOT/TOE  2020  . reverse right total shoulder arthroplasty Right 11/26/2019   Dr.Poggi  . Colon @ [facility]  04/13/2023   TVA/Tubular adenomas/Repeat colon after Advance endoscopy  appt/SMR  . EGD @ [facility]  04/13/2023   Fundic gland polyps/Gastritis/Repeat 14yrs if medically fit/SMR  . BACK SURGERY     Lumbar Surgery x3  . Bilateral Leg Stents    . COLONOSCOPY  08/04/93; 11/23/99; 06/20/06   Diverticulosis  . FRACTURE SURGERY     Left wrist  . TONSILLECTOMY      Family History  Problem Relation Age of Onset  . Diabetes type II Mother   . Prostate cancer Father   . Prostate cancer Brother   . Transient ischemic attack Brother     Social History   Socioeconomic History  . Marital status: Married  Tobacco Use  . Smoking status: Former    Current packs/day: 0.00    Types: Cigarettes    Quit date: 11/07/1998    Years since quitting: 25.8    Passive exposure: Past  . Smokeless tobacco: Never  Vaping Use  . Vaping status: Never Used  Substance and Sexual Activity  . Alcohol use: Not Currently    Alcohol/week: 0.0 standard drinks of alcohol    Comment: beer occasionally  . Drug use: Never  . 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 Difficulty of Paying Living Expenses: Not hard at all  Food Insecurity: No Food Insecurity (08/21/2024)   Hunger Vital Sign   . Worried About Programme Researcher, Broadcasting/film/video in the Last Year: Never true   . Ran Out of Food in the Last Year: Never true  Transportation Needs: No Transportation Needs (08/21/2024)   PRAPARE - Transportation   . Lack of Transportation (Medical): No   . Lack of Transportation (Non-Medical): No  Housing Stability: Low Risk  (08/21/2024)   Housing Stability Vital Sign   . Unable to Pay for Housing in the Last Year: No   . Number of Times Moved in the Last Year: 0    . Homeless in the Last Year: No     Current Outpatient Medications  Medication Sig Dispense Refill  . ALPRAZolam  (XANAX ) 0.25 MG tablet Take 1 tablet (0.25 mg total) by mouth at bedtime as needed for Sleep 30 tablet 3  . aspirin  81 MG EC tablet Take 1 tablet (81 mg total) by mouth once daily    . cholecalciferol  (VITAMIN D3) 1,000 unit capsule Take 2,000 Units by mouth once daily.    . clopidogreL  (PLAVIX ) 75 mg tablet Take 75 mg by mouth once daily    . clopidogreL  (PLAVIX ) 75 mg tablet Take 75 mg by mouth once daily    . cyanocobalamin  (VITAMIN B12) 1000 MCG tablet Take 1,000 mcg by mouth once daily    . diphenhydrAMINE  (BENADRYL ) 25 mg capsule Take by mouth as needed    . gabapentin  (NEURONTIN ) 300 MG capsule TAKE 1 CAPSULE (300 MG TOTAL) BY MOUTH 2 (TWO) TIMES DAILY FOR 90 DAYS 60 capsule 2  . hydroCHLOROthiazide  (HYDRODIURIL ) 25 MG tablet TAKE 1 TABLET (25 MG TOTAL) BY MOUTH DAILY. 90 tablet 1  . HYDROcodone -acetaminophen  (NORCO) 5-325 mg tablet Take one tablet at night for pain; may take up to every 6 hours as needed for pain if not working or driving (Patient not taking: Reported on 08/21/2024) 20 tablet 0  . mupirocin (BACTROBAN) 2 % ointment Apply 1 Application topically 3 (three) times daily (Patient not taking: Reported on 08/21/2024)    . ondansetron  (ZOFRAN -ODT) 4 MG disintegrating tablet Take 1 tablet (4 mg total) by mouth every 8 (eight) hours as needed for Nausea (colonoscopy prep) (Patient not taking: Reported on 08/21/2024) 5 tablet 0  . pantoprazole  (PROTONIX ) 40 MG DR tablet Take 1 tablet (40 mg total) by mouth once daily 90 tablet 3  . potassium chloride  (KLOR-CON ) 10 MEQ ER tablet TAKE 1 TABLET BY MOUTH EVERY DAY 90 tablet 1  . pravastatin  (PRAVACHOL ) 20 MG tablet TAKE 1 TABLET BY MOUTH EVERY DAY AT NIGHT 90 tablet 1  . predniSONE  (DELTASONE ) 10 MG tablet May take 1 tablet twice daily with food for 5 days, then 1 tablet once daily with food for 5 days. 15 tablet 0  .  tamsulosin  (FLOMAX ) 0.4 mg capsule TAKE 1 CAPSULE (0.4 MG TOTAL) BY MOUTH ONCE DAILY TAKE 30 MINUTES AFTER SAME MEAL EACH DAY. 90 capsule 1   No current facility-administered medications for this visit.    Allergies  Allergen Reactions  . Codeine Itching    Tolerates per pt      Review of Systems: A 10+ ROS was performed, reviewed, and the pertinent orthopaedic findings are documented in the HPI.  I have reviewed and agree with  the ROS captured by the CMA.    Physical Exam:  Imaging and Results: Radiographs of the right elbow that were obtained on 08/21/2024 were personally reviewed. Radiographs demonstrates soft tissue swelling at the tip of the olecranon consistent with an enlarged olecranon bursa.  There is some small osteophytes off the tip of the lateral epicondyle but this may also represent an old injury.  Some mild joint space narrowing osteophyte fight formation along the ulnotrochlear joint, medially.  No significant spurring noted about the tip of the olecranon.  No fractures, dislocations.  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 Previous drainage attempts and conservative measures failed. Functionally impairing - Plan surgical excision of the bursa - Discussed surgical risks: infection, injury to nearby structures, hematoma formation, recurrence, infection, stiffness, pain. - Discussed expected postoperative course - Coordinate with surgery scheduler for procedure planning. - Patient will reach out to physician prescribing Plavix  to see if it is safe for him to come off for this surgery - Patient will follow-up postoperatively   Jackquline CANDIE Barrack, MD [facility] Orthopaedics and Sports Medicine [address] [place], [place] [address] Phone: [phone]  This note was generated in part with voice recognition software; please excuse any typographical  errors that were not detected and corrected. This note has been created using automated tools and reviewed for accuracy by [facility].

## 2024-08-29 ENCOUNTER — Telehealth (INDEPENDENT_AMBULATORY_CARE_PROVIDER_SITE_OTHER): Payer: Self-pay | Admitting: Vascular Surgery

## 2024-08-29 NOTE — Telephone Encounter (Signed)
 Pt came in and stated that Dr. Jackquline Barrack with [facility] is going to do surgery on his elbow soon and wants to talk directly to Dr. Selinda Gu concerning the pts blood thinner he is on before surgery. Call back number for Dr Barrack is [phone].

## 2024-08-29 NOTE — Telephone Encounter (Signed)
 Clearance has been received, signed, and faxed back to Dr Ezra office. Patient has been notified that the clearance has been signed and faxed.

## 2024-09-02 ENCOUNTER — Other Ambulatory Visit: Payer: Self-pay

## 2024-09-03 ENCOUNTER — Other Ambulatory Visit: Payer: Self-pay

## 2024-09-11 ENCOUNTER — Other Ambulatory Visit: Payer: Self-pay

## 2024-09-11 ENCOUNTER — Encounter
Admission: RE | Admit: 2024-09-11 | Discharge: 2024-09-11 | Disposition: A | Discharge status: Home / Self Care | Source: Ambulatory Visit

## 2024-09-11 VITALS — Ht 66.0 in | Wt 136.8 lb

## 2024-09-11 DIAGNOSIS — I2510 Atherosclerotic heart disease of native coronary artery without angina pectoris: Secondary | ICD-10-CM

## 2024-09-11 DIAGNOSIS — I7025 Atherosclerosis of native arteries of other extremities with ulceration: Secondary | ICD-10-CM

## 2024-09-11 DIAGNOSIS — I714 Abdominal aortic aneurysm, without rupture, unspecified: Secondary | ICD-10-CM

## 2024-09-11 DIAGNOSIS — Z01810 Encounter for preprocedural cardiovascular examination: Secondary | ICD-10-CM

## 2024-09-11 DIAGNOSIS — M7021 Olecranon bursitis, right elbow: Secondary | ICD-10-CM

## 2024-09-11 DIAGNOSIS — E785 Hyperlipidemia, unspecified: Secondary | ICD-10-CM

## 2024-09-11 DIAGNOSIS — I10 Essential (primary) hypertension: Secondary | ICD-10-CM

## 2024-09-11 DIAGNOSIS — I214 Non-ST elevation (NSTEMI) myocardial infarction: Secondary | ICD-10-CM

## 2024-09-11 HISTORY — DX: Anemia, unspecified: D64.9

## 2024-09-11 NOTE — Progress Notes (Signed)
  Perioperative Services Pre-Admission/Anesthesia Testing    Date: 09/11/24  Name: Bryan Welch DOB: 1935/12/02 MRN:   [id number]  Re: Plans for surgery; need for preoperative cardiovascular clearance  Planned Surgical Procedure(s):     Case: [id number] Date/Time: 09/17/24 9075   Procedure: BURSECTOMY, ELBOW (Right: Elbow)   Anesthesia type: Choice   Diagnosis: Olecranon bursitis of right elbow [M70.21]   Pre-op diagnosis: Olecranon bursitis of right elbow M70.21   Location: [facility] OR ROOM 09 / [facility]S [facility] AN[facility]   Surgeons: Ezra Jackquline GORMAN, MD        Clinical Notes:  Patient is scheduled for the above procedure on 09/17/2024 with Dr. Jackquline Ezra, MD.  During the course of patient's preoperative interview, it was determined the patient had significant cardiovascular history, which included CAD, NSTEMI, PAD, AAA, HTN, HLD.  Patient did not establish care with cardiology service line following NSTEMI in 10/2023.  Given his significant cardiovascular history, it is felt that patient needs to be seen and cleared by cardiology prior to proceeding with elective orthopedic procedure.  Patient being referred to [facility] cardiology today by PAT APP.  Referral documents generated and sent to office.  Office asked to reach out to patient to schedule appointment.  Additionally, they were asked to reach out to PAT APP should appointment  not be able to be scheduled before patient's upcoming surgery date of 09/17/2024.  Patient is aware that surgery may need to be rescheduled pending clearance from the cardiovascular service line.  Will send a copy of today's note to surgeon to make him aware.  Surgeon may be able to expedite appointment with cardiology.  No further needs from the PAT department identified at this time.  Dorise Pereyra, MSN, APRN, FNP-C, CEN [facility]  Perioperative Services Nurse Practitioner Phone: [phone] Fax: [phone] 09/11/24 11:12 AM  NOTE: This note has been prepared using Dragon dictation software. Despite my best ability to proofread, there is always the potential that unintentional transcriptional errors may still occur from this process.

## 2024-09-11 NOTE — Patient Instructions (Addendum)
 Your procedure is scheduled on:   TUESDAY NOVEMBER 11  Report to the Registration Desk on the 1st floor of the [facility] To find out your arrival time, please call [phone] between 1PM - 3PM on:  MONDAY NOVEMBER 10  If your arrival time is 6:00 am, do not arrive before that time as the [facility] entrance doors do not open until 6:00 am.  REMEMBER: Instructions that are not followed completely may result in serious medical risk, up to and including death; or upon the discretion of your surgeon and anesthesiologist your surgery may need to be rescheduled.  Do not eat food after midnight the night before surgery.  No gum chewing or hard candies.  You may however, drink CLEAR liquids up to 2 hours before you are scheduled to arrive for your surgery. Do not drink anything within 2 hours of your scheduled arrival time.  Clear liquids include: - water  - apple juice without pulp - gatorade (not RED colors) - black coffee or tea (Do NOT add milk or creamers to the coffee or tea) Do NOT drink anything that is not on this list.  In addition, your doctor has ordered for you to drink the provided:  Ensure Pre-Surgery Clear Carbohydrate Drink  Drinking this carbohydrate drink up to two hours before surgery helps to reduce insulin resistance and improve patient outcomes. Please complete drinking 2 hours before scheduled arrival time.  One week prior to surgery:   Stop Anti-inflammatories (NSAIDS) such as Advil, Aleve, Ibuprofen, Motrin, Naproxen, Naprosyn and Aspirin  based products such as Excedrin, Goody's Powder, BC Powder. Stop ANY OVER THE COUNTER supplements until after surgery. Cholecalciferol  25 (Vitamin D3)  Multiple Vitamins-Minerals (PRESERVISION )  potassium chloride  (KLOR-CON )   You may however, continue to take Tylenol  if needed for pain up until the day of surgery.  **Follow recommendations regarding stopping blood thinners.** clopidogrel  (PLAVIX ) hold 5 days, last dose  WEDNESDAY  NOVEMBER 5  Continue taking all of your other prescription medications up until the day of surgery.  ON THE DAY OF SURGERY DO NOT TAKE ANY MEDICATIONS  No Alcohol for 24 hours before or after surgery.  Do not use any recreational drugs for at least a week (preferably 2 weeks) before your surgery.  Please be advised that the combination of cocaine and anesthesia may have negative outcomes, up to and including death. If you test positive for cocaine, your surgery will be cancelled.  On the morning of surgery brush your teeth with toothpaste and water, you may rinse your mouth with mouthwash if you wish. Do not swallow any toothpaste or mouthwash.  Use CHG Soap as directed on instruction sheet.  IF YOU ARE UNABLE TO GET THE SOAP PLEASE USE DIAL SOAP.  Do not wear jewelry, make-up, hairpins, clips or nail polish.  For welded (permanent) jewelry: bracelets, anklets, waist bands, etc.  Please have this removed prior to surgery.  If it is not removed, there is a chance that hospital personnel will need to cut it off on the day of surgery.  Do not wear lotions, powders, or perfumes.   Do not shave body hair from the neck down 48 hours before surgery.  Contact lenses, hearing aids and dentures may not be worn into surgery.  Do not bring valuables to the hospital. [facility] is not responsible for any missing/lost belongings or valuables.   Notify your doctor if there is any change in your medical condition (cold, fever, infection).  Wear comfortable  clothing (specific to your surgery type) to the hospital.  After surgery, you can help prevent lung complications by doing breathing exercises.  Take deep breaths and cough every 1-2 hours.   If you are being discharged the day of surgery, you will not be allowed to drive home. You will need a responsible individual to drive you home and stay with you for 24 hours after surgery.   If you are taking public transportation, you  will need to have a responsible individual with you.  Please call the Pre-admissions Testing Dept. at [phone] if you have any questions about these instructions.  Surgery Visitation Policy:  Patients having surgery or a procedure may have two visitors.  Children under the age of 76 must have an adult with them who is not the patient.  Merch[employer], Retail to address health-related social needs:  [email or web address]                                                                                                              Preparing for Surgery with CHLORHEXIDINE  GLUCONATE (CHG) Soap  Chlorhexidine  Gluconate (CHG) Soap  o An antiseptic cleaner that kills germs and bonds with the skin to continue killing germs even after washing  o Used for showering the night before surgery and morning of surgery  Before surgery, you can play an important role by reducing the number of germs on your skin.  CHG (Chlorhexidine  gluconate) soap is an antiseptic cleanser which kills germs and bonds with the skin to continue killing germs even after washing.  Please do not use if you have an allergy to CHG or antibacterial soaps. If your skin becomes reddened/irritated stop using the CHG.  1. Shower the NIGHT BEFORE SURGERY with CHG soap.  2. If you choose to wash your hair, wash your hair first as usual with your normal shampoo.  3. After shampooing, rinse your hair and body thoroughly to remove the shampoo.  4. Use CHG as you would any other liquid soap. You can apply CHG directly to the skin and wash gently with a clean washcloth.  5. Apply the CHG soap to your body only from the neck down. Do not use on open wounds or open sores. Avoid contact with your eyes, ears, mouth, and genitals (private parts). Wash face and genitals (private parts) with your normal soap.  6. Wash thoroughly, paying special attention to the area where your surgery will be performed.  7.  Thoroughly rinse your body with warm water.  8. Do not shower/wash with your normal soap after using and rinsing off the CHG soap.  9. Do not use lotions, oils, etc., after showering with CHG.  10. Pat yourself dry with a clean towel.  11. Wear clean pajamas to bed the night before surgery.  12. Place clean sheets on your bed the night of your shower and do not sleep with pets.  13. Do not apply any deodorants/lotions/powders.  14. Please wear clean clothes to the hospital.  15. Remember to brush your  teeth with your regular toothpaste.

## 2024-09-13 ENCOUNTER — Encounter: Payer: Self-pay | Admitting: Urgent Care

## 2024-09-13 NOTE — Progress Notes (Signed)
 Cardiology New Patient Visit  PRIMARY CARE PROVIDER:   Rudolpho Norleen Lenis, MD [address] [facility] [address] [facility] [place] [address]  Bryan, Welch 09/13/2024 DOB: November 08, 1935 Age: 88 y.o. [facility]    Chief Complaint  Patient presents with  . Establish Care  . Pre-op Exam    History of Present Illness  Bryan Welch is a 88 y.o. male here for preop eval. He is set to undergo elbow bursectomy 09/17/24 at [facility]. He as a h/o PAD s/p mechanical thrombectomy R SFA, popliteal artery, tibioperoneal trunk, and peroneal artery 08/2023. Also had a stent placed to R SFA w/ angioplasty of peroneal artery and tibioperoneal trunk at that time. Has a h/o AAA 4.0 cm seen on duplex US  07/2023. Stable compared to prior. 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 Has a h/o emphysema and former tobacco use. Also has a h/o R carotid stenting. He denies a prior h/o MI or PCI. Tries to walk most days for exercise, about 30 min at a time. Does not have to stop due to CP. Denies SOB or DOE. No PND or orthopnea. No thigh/groin pain or buttock pain w/ activity or at rest. No bleeding.    Current Medications and Allergies   Allergies  Allergen Reactions  . Codeine Itching    Tolerates per pt      Current Outpatient Medications  Medication Sig Dispense Refill  . ALPRAZolam  (XANAX ) 0.25 MG tablet Take 1 tablet (0.25 mg total) by mouth at bedtime as needed for Sleep 30 tablet 3  . aspirin  81 MG EC tablet Take 1 tablet (81 mg total) by mouth once daily    . cholecalciferol  (VITAMIN D3) 1,000 unit capsule Take 2,000 Units by mouth once daily.    . clopidogreL  (PLAVIX ) 75 mg tablet Take 75 mg by mouth once daily    . clopidogreL  (PLAVIX )  75 mg tablet Take 75 mg by mouth once daily    . diphenhydrAMINE  (BENADRYL ) 25 mg capsule Take by mouth as needed    . gabapentin  (NEURONTIN ) 300 MG capsule TAKE 1 CAPSULE (300 MG TOTAL) BY MOUTH 2 (TWO) TIMES DAILY FOR 90 DAYS 60 capsule 2  . hydroCHLOROthiazide  (HYDRODIURIL ) 25 MG tablet TAKE 1 TABLET (25 MG TOTAL) BY MOUTH DAILY. 90 tablet 1  . HYDROcodone -acetaminophen  (NORCO) 5-325 mg tablet Take one tablet at night for pain; may take up to every 6 hours as needed for pain if not working or driving 20 tablet 0  . ondansetron  (ZOFRAN -ODT) 4 MG disintegrating tablet Take 1 tablet (4 mg total) by mouth every 8 (eight) hours as needed for Nausea (colonoscopy prep) 5 tablet 0  . pantoprazole  (PROTONIX ) 40 MG DR tablet Take 1 tablet (40 mg total) by mouth once daily 90 tablet 3  . potassium chloride  (KLOR-CON ) 10 MEQ ER tablet TAKE 1 TABLET BY MOUTH EVERY DAY 90 tablet 1  . pravastatin  (PRAVACHOL ) 20 MG tablet TAKE 1 TABLET BY MOUTH EVERY DAY AT NIGHT 90 tablet 1  . predniSONE  (DELTASONE ) 10 MG tablet May take 1 tablet twice daily with food for 5 days, then 1 tablet once daily with food for 5 days. 15 tablet 0  . tamsulosin  (FLOMAX ) 0.4 mg capsule TAKE 1 CAPSULE (0.4 MG TOTAL)  BY MOUTH ONCE DAILY TAKE 30 MINUTES AFTER SAME MEAL EACH DAY. 90 capsule 1  . cyanocobalamin  (VITAMIN B12) 1000 MCG tablet Take 1,000 mcg by mouth once daily    . mupirocin (BACTROBAN) 2 % ointment Apply 1 Application topically 3 (three) times daily (Patient not taking: Reported on 09/13/2024)     No current facility-administered medications for this visit.    Additional Past Medical, Social, and Family History:  ADDITIONAL PROBLEM LIST: Problem List  Date Reviewed: 08/21/2024        ICD-10-CM Priority Class Noted - Resolved Diagnosed   Acute blood loss anemia D62   10/25/2023 - Present    Melena K92.1   10/25/2023 - Present    NSTEMI (non-ST elevated myocardial infarction) (CMS/HHS-HCC) I21.4   10/25/2023 - Present     Atherosclerosis of artery of extremity with rest pain (CMS-HCC) I70.229   08/09/2023 - Present    Hx of amputation of lesser toe, right (HHS-HCC) Z89.421   07/25/2023 - Present    Gastroenteritis due to COVID-19 virus U07.1, A08.39   06/09/2023 - Present    Generalized weakness R53.1   06/09/2023 - Present    History of GI bleed Z87.19   06/09/2023 - Present    Sepsis (CMS/HHS-HCC) A41.9   06/09/2023 - Present    Gastrointestinal tract imaging abnormality R93.3   05/10/2023 - Present    Upper gastrointestinal hemorrhage due to angiodysplasia K31.811   05/10/2023 - Present    Acute GI bleeding K92.2   05/08/2023 - Present    CAD (coronary artery disease) I25.10   05/08/2023 - Present    Hypokalemia E87.6   05/08/2023 - Present    Chronic kidney disease, stage 3b (CMS-HCC) N18.32   05/08/2023 - Present    HTN (hypertension) I10   05/08/2023 - Present    B12 deficiency E53.8   04/21/2023 - Present    Iron  deficiency anemia D50.9   04/21/2023 - Present    Anemia D64.9   03/22/2023 - Present    Ulcer of aorta () I71.9   03/22/2023 - Present    CKD stage 3a, GFR 45-59 ml/min (CMS-HCC) N18.31   03/22/2023 - Present    Atherosclerosis of native arteries of extremity with intermittent claudication I70.219   08/10/2021 - Present    Overview Signed 10/29/2021 10:39 AM by Everardo Raisin, CMA  Last Assessment & Plan:  Formatting of this note might be different from the original. Given his worsening claudication symptoms in the right leg as well as some concern of may be rest pain and potentially peripheral embolization, angiography with possible intervention will be planned.  He has some known PAD in both legs that was stable earlier this year, but his symptoms have progressed.  We will address his occlusive disease first.  If he still continues to have symptoms and we are concerned for peripheral embolization of his aneurysm, we will address that at a later date.  Risks and benefits of angiography with possible revascularization were  discussed with the patient and he is agreeable to proceed.      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  Last year this was 3.7 cm. Recheck in 1 year      Carotid stenosis I65.29   11/15/2016 - Present    Overview Signed 01/24/2019  2:14 PM by Gretel Rosina Cohens, PA  Last Assessment & Plan:  He is studied with carotid duplex today which reveals a widely patent right carotid endarterectomy and stable 1 to 39% left ICA stenosis.  He is now about 5 years status post right carotid endarterectomy.  Continue current medical regimen including aspirin  and statin agent.  Recheck in 1 year      Essential hypertension, benign I10   11/15/2016 - Present    Overview Signed 01/24/2019  2:14 PM by Gretel Rosina Cohens, PA  Last Assessment & Plan:  blood pressure control important in reducing the progression of atherosclerotic disease. On appropriate oral medications.      Benign non-nodular prostatic hyperplasia with lower urinary tract symptoms N40.1   07/14/2015 - Present    Elevated PSA, less than 10 ng/ml R97.20   Unknown - Present    Peripheral vascular disease () I73.9   Unknown - Present    Hyperlipidemia E78.5   Unknown - Present    Kidney stones N20.0   Unknown - Present    Gout M10.9   06/02/2014 - Present    Tingling R20.2   04/16/2014 - Present     Confusion, unspecified R41.0   04/07/2014 - Present    Facial numbness R20.0   04/07/2014 - Present    RESOLVED: Chronic kidney disease, stage 3b (CMS-HCC) N18.32   01/31/2024 - 08/26/2024    RESOLVED: Atherosclerosis of native arteries of the extremities with ulceration (CMS/HHS-HCC) I70.25   09/28/2021 - 01/18/2023    Overview Signed 10/29/2021 10:39 AM by Everardo Raisin, CMA  Last Assessment & Plan:  Formatting of this note might be different from the original. ABIs today are significantly improved up to 0.97 on the right.  Previously these were below 0.8.  On the left, where stable at 0.89.  His toe amputation site is healing quite slowly.  He is scheduled to see the podiatrist again in about 2 weeks.  It would appear his perfusion is reasonably intact at this point.  I will plan on seeing him back in about 3 months with noninvasive studies.  Continue current medical regimen.      RESOLVED: Traumatic complete tear of right rotator cuff S46.011A  Chronic 09/30/2019 - 11/29/2019    RESOLVED: CRF (chronic renal failure), stage 3 (moderate) (CMS-HCC) N18.30   07/25/2018 - 08/05/2024    RESOLVED: Hydronephrosis of right kidney, unspecified, unspecified N13.30   01/25/2018 - 01/23/2020    Overview Signed 01/25/2018 10:16 AM by Epifanio Alm Dover, MD  CT 09/20/17 NON-VASCULAR  1. 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 2. Extensive colonic diverticulosis without evidence of diverticulitis.  Critical Value/emergent results were called by telephone at the time of interpretation on 09/20/2017 at 2:45 pm to Dr. DOVER ESSEX , who verbally acknowledged these results.  US  01/24/18 visualized.  IMPRESSION: 1. Moderate hydronephrosis. In review of the  prior CT, this is most consistent with a right UPJ narrowing. 2. Echogenic renal parenchyma bilaterally  consistent with chronic renal medical disease.      RESOLVED: Persistent cough R05.3   05/13/2015 - 07/26/2020    RESOLVED: Combined arterial insufficiency and corporo-venous occlusive erectile dysfunction N52.03   09/09/2014 - 07/26/2021    RESOLVED: Hypertension I10   Unknown - 01/23/2020     ADDITIONAL MEDICAL HISTORY: Past Medical History:  Diagnosis Date  . AAA (abdominal aortic aneurysm) ()   . Cataracts, bilateral   . Chickenpox   . Confusion, unspecified 04/07/2014  . COPD (chronic obstructive pulmonary disease) (CMS/HHS-HCC)   . Dog bite of arm 02/25/14  . Elevated PSA, less than 10 ng/ml   . Erectile dysfunction   . Facial numbness 04/07/2014  . GERD (gastroesophageal reflux disease)   . History of colon polyps   . History of kidney stones   . Hyperlipidemia   . Hypertension   . Kidney stones   . Peripheral vascular disease ()   . Psoriasis     Social History: See HPI above. Additionally: Social History   Socioeconomic History  . Marital status: Married  Tobacco Use  . Smoking status: Former    Current packs/day: 0.00    Types: Cigarettes    Quit date: 11/07/1998    Years since quitting: 25.8    Passive exposure: Past  . Smokeless tobacco: Never  Vaping Use  . Vaping status: Never Used  Substance and Sexual Activity  . Alcohol use: Not Currently    Alcohol/week: 0.0 standard drinks of alcohol    Comment: beer occasionally  . Drug use: Never  . 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 Difficulty of Paying Living Expenses: Not hard at all  Recent Concern: Financial Resource Strain - Medium Risk (08/29/2024)   Overall  Financial Resource Strain (CARDIA)   . Difficulty of Paying Living Expenses: Somewhat hard  Food Insecurity: No Food Insecurity (09/13/2024)   Hunger Vital Sign   . Worried About Programme Researcher, Broadcasting/film/video in the Last Year: Never true   . Ran Out of Food in the Last Year: Never true  Transportation Needs: No Transportation Needs (09/13/2024)   PRAPARE - Transportation   . Lack of Transportation (Medical): No   . Lack of Transportation (Non-Medical): No  Housing Stability: Low Risk  (09/13/2024)   Housing Stability Vital Sign   . Unable to Pay for Housing in the Last Year: No   . Number of Times Moved in the Last Year: 0   . Homeless in the Last Year: No    Family History: See HPI above. Additionally: Family History  Problem Relation Name Age of Onset  . Diabetes type II Mother    . Prostate cancer Father    . Prostate cancer Brother    . Transient ischemic attack Brother      Review of Systems: See HPI above. Additionally: Constitutional: no fevers or chills Eyes: no diplopia ENT: no epistaxis Cardiovascular: See HPI above Respiratory: See HPI above Gastrointestinal: no BRBPR Genitourinary: no hematuria Musculoskeletal: no joint swelling  Skin: no rash Neurological: no strokelike sx Psychiatric: no SI Endocrine: no night sweats Hematologic/Lymphatic: no bleeding Allergic/Immunologic: See allergies above   Physical Exam   Vitals:   09/13/24 0850  BP: 130/72  Pulse: 84  Resp: 16   Body mass index is 23.13 kg/m.  Wt Readings from Last 3 Encounters:  09/13/24 63 kg (139 lb)  08/29/24 64 kg (141 lb)  08/21/24 65.5 kg (144 lb 6.4 oz)   BP Readings from Last 3 Encounters:  09/13/24 130/72  08/29/24 138/78  08/21/24 (!) 153/64   Pulse Readings from Last 3 Encounters:  09/13/24 84  08/21/24 77  08/16/24 73     Gen: NAD Neck: No JVD CV: nl s1s2, RRR, soft systolic murmur Resp: CTAB, nl effort GI: abd soft, NT Skin: warm Edema: no LE edema MSK: no obvious  deformity Psych: nl affect Neuro: awake and alert Endo: no thyromegaly Lymph: no cervical LAD   Data/Results   Recent Labs    01/19/22 0704 01/11/23 0717 01/12/24 0720  CHOLTOTAL 183 185 172  HDL 52.9 40.6 40.5  LDLCALC 98 71 102  VLDL 32 74 29  TRIG 160 369* 147    Recent Labs    01/19/22 0704 07/08/22 0715 01/11/23 0717 03/28/23 1520 07/18/23 0706 01/12/24 0720 07/30/24 0719  NA 138   < > 139   < > 142 142 140  K 3.9   < > 3.3*   < > 3.5* 4.0 4.0  BUN 28*   < > 35*   < > 22 31* 33*  CREATININE 1.7*   < > 1.7*   < > 1.4* 1.5* 1.4*  CO2 28.0   < > 30.4   < > 26.6 26.5 27.3  GLUCOSE 93   < > 103   < > 152* 103 99  ALT 10  --  9  --   --  11  --   AST 19  --  19  --   --  16  --   TBILI 0.4  --  0.4  --   --  0.5  --   ALB 4.5  --  4.4  --   --  4.5  --    < > = values in this interval not displayed.    Recent Labs    01/12/24 0720 05/02/24 0706 07/30/24 0719  WBC 5.3 6.4 5.8  HGB 11.7* 15.7 15.2  HCT 39.4* 47.4 45.6  MCV 79.6* 85.9 89.1  PLT 242 184 192    Recent Labs    04/04/23 1353  INR 1.9*      ECG: I personally interpreted. NSR  Echo 10/27/23 ([facility]) 1. Left ventricular ejection fraction, by estimation, is 60 to 65%. The left ventricle has normal function. The left ventricle has no regional wall motion abnormalities. There is moderate asymmetric left ventricular hypertrophy of the basal-septal segment. Left ventricular diastolic parameters are indeterminate. 2. Right ventricular systolic function is normal. The right ventricular size is normal. There is normal pulmonary artery systolic pressure. The estimated right ventricular systolic pressure is 25.3 mmHg. 3. The mitral valve is degenerative. Mild mitral valve regurgitation. No evidence of mitral stenosis. Severe mitral annular calcification. 4. The aortic valve is abnormal. There is moderate calcification of the aortic valve. Aortic valve regurgitation is trivial. Aortic valve  sclerosis/calcification is present, without any evidence of aortic stenosis on this exam. AV gradient interrogated from apical window only, consider Doppler evaluation from multiple acoustic windows to further evaluate  for aortic valve stenosis if clinically relevant. 5. The inferior vena cava is normal in size with greater than 50% respiratory variability, suggesting right atrial pressure of 3 mmHg.   Assessment   88 y.o. 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 involving the descending distal thoracic aorta seen on CTA chest 03/2023 -current long term use of ASA -on clopidogrel  tx -HLD -HTN -former tobacco use  Plan   -chronic illness posing a threat to life: severe PAD, aortic atherosclerosis w/ ulceration -external notes reviewed: pre-op anesthesia note 09/11/24 -tests ordered and reviewed today: ECG showing NSR, no Qw -independent interpretation of test performed by another MD: echo ([facility]) 10/2023  -no si/sx of angina or HF, preserved LV function w/ no major valvular lesions on echo 10/2023, stable functional ability -- walks routinely for 30 min at at a time w/o having to stop, which translates to about 3-4 METS  -he is at elevated risk of perioperative cardiovascular events given extensive PAD history. No amount of preoperative cardiovascular testing will mitigate this risk. I therefore recommend against any additional functional or anatomic testing prior to planned procedure  -recommend continuing ASA  throughout the perioperative period. Plavix  to be stopped at operator's discretion; recommend resuming ASAP following OR  -recommend intensification of statin tx  -f/u with me in six mos  Attestation Statement:   I personally performed the service. (TP)  DEARL LEAVEN, MD   Dearl Leaven MD M[facility] Assistant Professor of Medicine, Division of Cardiology [facility]

## 2024-09-13 NOTE — Progress Notes (Signed)
 Perioperative / Anesthesia Services  Pre-Admission Testing Clinical Review / Pre-Operative Anesthesia Consult  Date: 09/13/24  PATIENT DEMOGRAPHICS: Name: Bryan Welch DOB: 06/21/1936 MRN:   [id number]  Note: Available PAT nursing documentation and vital signs have been reviewed. Clinical nursing staff has updated patient's PMH/PSHx, current medication list, and drug allergies/intolerances to ensure complete and comprehensive history available to assist care teams in MDM as it pertains to the aforementioned surgical procedure and anticipated anesthetic course. Extensive review of available clinical information personally performed. Nursing documentation reviewed. [place] PMH and PSHx updated with any diagnoses and/or procedures that I have knowledge of that may have been inadvertently omitted during his intake with the pre-admission testing department's nursing staff.  PLANNED SURGICAL PROCEDURE(S):   Case: [id number] Date/Time: 09/17/24 0924   Procedure: BURSECTOMY, ELBOW (Right: Elbow)   Anesthesia type: Choice   Diagnosis: Olecranon bursitis of right elbow [M70.21]   Pre-op diagnosis: Olecranon bursitis of right elbow M70.21   Location: [facility] OR ROOM 05 / [facility]   Surgeons: Ezra Jackquline GORMAN, MD        CLINICAL DISCUSSION: Bryan Welch is a 88 y.o. male who is submitted for pre-surgical anesthesia review and clearance prior to him undergoing the above procedure. Patient is a Former Smoker (quit 2001). Pertinent PMH includes: CAD, type 2 MI, PVD, PAD with intermittent claudication, AAA, penetrating aortic ulcer, HTN, HLD, CKD-III, COPD, GERD (on daily PPI), anemia, BPH, nephrolithiasis, OA, right olecranon bursitis, anxiety.  Patient is followed by cardiology [name], MD). He was last seen in the cardiology clinic on 09/13/2024 notes reviewed. At the time of his clinic visit, patient doing well overall from a cardiovascular perspective. Patient denied  any chest pain, shortness of breath, PND, orthopnea, palpitations, significant peripheral edema, weakness, fatigue, vertiginous symptoms, or presyncope/syncope. Patient with a past medical history significant for cardiovascular diagnoses. Documented physical exam was grossly benign, providing no evidence of acute exacerbation and/or decompensation of the patient's known cardiovascular conditions.   Patient with a known history of a AAA that was first appreciated on Doppler back in 07/2023.  Aortic atherosclerosis present with multiple areas of aortic ulceration and the distal arch and proximal descending thoracic aorta with evidence of a pedunculated mural thrombus at the distal arch with a penetrating aortic ulcer measuring approximately 17 x 9 mm involving the distal thoracic aorta.  Most recent imaging of aneurysmal defect was performed on 10/25/2023, at which time the infrarenal abdominal aortic aneurysm measured 4.3 cm.  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  There was mild stenosis noted at the origin of the LEFT renal artery.  IMA chronically occluded at the origin.  Patient with a history of significant PAD.  He underwent a mechanical thrombectomy of the RIGHT SFA, popliteal artery, tibioperoneal trunk, and peroneal artery in 08/2023.  Patient had a stent placed to the RIGHT SFA with angioplasty of the peroneal artery and tibioperoneal trunk at that time.  Patient experienced a type 2 MI in the setting of profound anemia on 10/25/2023.  Troponin peaked at 688 ng/L.  Patient with symptomatic anemia and a hemoglobin of 5.4 and ferritin was 14.  Patient was transfused 2 units of PRBCs and received 1 dose of intravenous iron .  Most recent TTE performed on 10/27/2023 revealed a normal left ventricular systolic function with an EF of 65%.  There was moderate asymmetric LVH of the basal septal segment.  There were no regional wall  motion abnormalities.  Left  ventricular diastolic Doppler parameters were indeterminant. Right ventricular size and function normal with a TAPSE measuring 2.6 cm  (normal range >/= 1.6 cm).  RVSP = 25.3 mmHg. there was severe mitral annular calcification.  Aortic valve with moderate calcifications.  There was trivial aortic, mild mitral, mild tricuspid, and trivial pulmonic valve regurgitation.  All transvalvular gradients were noted to be normal providing no evidence of hemodynamically significant valvular stenosis. Aorta normal in size with no evidence of ectasia or aneurysmal dilatation.  Given his known significant PAD, patient remains on daily DAPT therapy using ASA + clopidogrel ; patient reportedly compliant with therapy.  Blood pressure reasonably controlled at 130/72 mmHg on currently prescribed diuretic (HCTZ) monotherapy.  Patient is on pravastatin  for his HLD diagnosis and ASCVD prevention. Patient is not diabetic. He does not have an OSAH diagnosis.  Functional capacity limited by patient's age and lower extremity claudication pain.  With that said, patient able to complete all of his ADLs/IADLs without cardiovascular limitation.  Per the DASI, patient still felt to be able to achieve at least 4 METS of physical activity without experiencing any significant angina/anginal equivalent symptoms.  No changes were made to his medication regimen during his visit with cardiology.  Patient scheduled to follow-up with outpatient cardiology in 6 months or sooner if needed.  [name] is scheduled for an elective BURSECTOMY, ELBOW (Right: Elbow) on 09/17/2024 with Dr. Jackquline GORMAN Barrack, MD. Given patient's past medical history significant for cardiovascular diagnoses, presurgical clearances were sought by patient's care providers.   Per internal medicine Gypsy, MD), patient is medically stable and is cleared to proceed with the planned surgical intervention at an overall LOW risk.  Per vascular surgery Earmon, MD), patient  is stable from a vascular standpoint.  Aware of need to stop clopidogrel  for 5 days prior to surgery.  This is acceptable at this point.  Patient may proceed with planned surgical intervention at an overall LOW risk.  Per cardiology, he is at elevated risk of perioperative cardiovascular events given extensive PAD history. No amount of preoperative cardiovascular testing will mitigate this risk. I therefore recommend against any additional functional or anatomic testing prior to planned procedure.   Again, this patient is on daily DAPT therapy.  Patient has been instructed on recommendations for holding his clopidogrel  for 5 days prior to his procedure with plans to restart since postoperatively respectively minimized by his primary attending surgeon.  The patient is aware that his last dose of clopidogrel  should be on 09/11/2024. Given that patient's past medical history is significant for cardiovascular diagnoses, including but not limited to CAD, orthopedics has cleared patient to continue his daily low dose ASA throughout his perioperative course. He will be asked to hold his normal dose on the day of his procedure only. Patient has been updated on these directives from his specialty care providers by the PAT team.  Patient denies previous perioperative complications with anesthesia in the past. In review his EMR, it is noted that patient underwent a general anesthetic course here at [facility] (ASA III) in 10/2023 without documented complications.   MOST RECENT VITAL SIGNS:    09/11/2024   10:43 AM 08/06/2024    8:36 AM 05/20/2024    1:34 PM  Vitals with BMI  Height 5' 6 5' 6   Weight 136 lbs 13 oz 140 lbs 2 oz   BMI 22.09 22.63   Systolic  145 156  Diastolic  72 85  Pulse  66    PROVIDERS/SPECIALISTS: NOTE: Primary physician provider listed below. Patient may have been seen by APP or partner within same practice.   PROVIDER ROLE / SPECIALTY LAST OV   Ezra Jackquline RAMAN, MD Orthopedics (Surgeon) 08/29/2024  Rudolpho Norleen BIRCH, MD Primary Care Provider 08/06/2024  Hilarie Rocher, MD Cardiology 09/13/2024  Brahmanday, Govinda, MD Hematology 05/20/2024   ALLERGIES: Allergies  Allergen Reactions   Codeine Itching    CURRENT HOME MEDICATIONS: No current facility-administered medications for this encounter.    acetaminophen  (TYLENOL ) 500 MG tablet   ALPRAZolam  (XANAX ) 0.25 MG tablet   aspirin  EC 81 MG tablet   Cholecalciferol  25 MCG (1000 UT) capsule   clopidogrel  (PLAVIX ) 75 MG tablet   gabapentin  (NEURONTIN ) 300 MG capsule   hydrochlorothiazide  (HYDRODIURIL ) 25 MG tablet   Multiple Vitamins-Minerals (PRESERVISION AREDS 2 PO)   pantoprazole  (PROTONIX ) 40 MG tablet   potassium chloride  (KLOR-CON ) 10 MEQ tablet   pravastatin  (PRAVACHOL ) 20 MG tablet   tamsulosin  (FLOMAX ) 0.4 MG CAPS capsule   HISTORY: Past Medical History:  Diagnosis Date   AAA (abdominal aortic aneurysm) without rupture 04/05/2017   Anemia    Arthritis    Atherosclerosis of native arteries of extremity with intermittent claudication 08/10/2021   Coronary artery disease    History of hiatal hernia    Hyperlipidemia    Hypertension    Hypokalemia 05/08/2023   Kidney disorder    right kidney doesn't work   NSTEMI (non-ST elevated myocardial infarction) (HCC) 10/25/2023   Olecranon bursitis, right elbow 05/25/2020   Peripheral vascular disease 04/05/2017   Presence of stent in artery    4 in legs, 1 in neck   Ulcer of aorta 03/22/2023   UPJ (ureteropelvic junction) obstruction 09/13/2018   Upper gastrointestinal hemorrhage due to angiodysplasia 05/10/2023   Wears dentures    full upper and lower   Past Surgical History:  Procedure Laterality Date   AMPUTATION TOE Right 09/16/2021   Procedure: AMPUTATION TOE;  Surgeon: Lennie Barter, DPM;  Location: [facility] SURGERY CNTR;  Service: Podiatry;  Laterality: Right;   BACK SURGERY     CAROTID STENT Right     CATARACT EXTRACTION W/PHACO Left 06/17/2019   Procedure: CATARACT EXTRACTION PHACO AND INTRAOCULAR LENS PLACEMENT (IOC) LEFT;  Surgeon: Myrna Adine Anes, MD;  Location: [facility] SURGERY [facility];  Service: Ophthalmology;  Laterality: Left;   CATARACT EXTRACTION W/PHACO Right 07/08/2019   Procedure: CATARACT EXTRACTION PHACO AND INTRAOCULAR LENS PLACEMENT (IOC) right  00:39.2  10.8%  5.02  ;  Surgeon: Myrna Adine Anes, MD;  Location: [facility] SUR[facility]ERY [facility];  Service: Ophthalmology;  Laterality: Right;   COLONOSCOPY WITH PROPOFOL  N/A 04/13/2023   Procedure: COLONOSCOPY WITH PROPOFOL ;  Surgeon: Onita Elspeth Sharper, DO;  Location: [facility] ENDOSCOPY;  Service: Gastroenterology;  Laterality: N/A;   ENTEROSCOPY N/A 05/10/2023   Procedure: ENTEROSCOPY;  Surgeon: Jinny Carmine, MD;  Location: [facility] ENDOSCOPY;  Service: Endoscopy;  Laterality: N/A;   ENTEROSCOPY N/A 10/27/2023   Procedure: ENTEROSCOPY;  Surgeon: Unk Corinn Skiff, MD;  Location: [facility] ENDOSCOPY;  Service: Gastroenterology;  Laterality: N/A;   ESOPHAGOGASTRODUODENOSCOPY (EGD) WITH PROPOFOL  N/A 04/13/2023   Procedure: ESOPHAGOGASTRODUODENOSCOPY (EGD) WITH PROPOFOL ;  Surgeon: Onita Elspeth Sharper, DO;  Location: [facility] ENDOSCOPY;  Service: Gastroenterology;  Laterality: N/A;   EYE SURGERY     GIVENS CAPSULE STUDY N/A 05/09/2023   Procedure: GIVENS CAPSULE STUDY;  Surgeon: Jinny Carmine, MD;  Location: [facility] ENDOSCOPY;  Service: Endoscopy;  Laterality: N/A;   GIVENS CAPSULE  STUDY  10/27/2023   Procedure: GIVENS CAPSULE STUDY;  Surgeon: Unk Corinn Skiff, MD;  Location: [facility] ENDOSCOPY;  Service: Gastroenterology;;   HOT HEMOSTASIS  05/10/2023   Procedure: HOT HEMOSTASIS (ARGON PLASMA COAGULATION/BICAP);  Surgeon: Jinny Carmine, MD;  Location: [facility] ENDOSCOPY;  Service: Endoscopy;;   LEG SURGERY Bilateral    stent placement   LOWER EXTREMITY ANGIOGRAPHY Right 08/19/2021   Procedure: LOWER EXTREMITY ANGIOGRAPHY;  Surgeon: Marea Selinda RAMAN, MD;  Location: [facility]  INVASIVE CV LAB;  Service: Cardiovascular;  Laterality: Right;   LOWER EXTREMITY ANGIOGRAPHY Right 08/09/2023   Procedure: Lower Extremity Angiography;  Surgeon: Marea Selinda RAMAN, MD;  Location: [facility] INVASIVE CV LAB;  Service: Cardiovascular;  Laterality: Right;   LOWER EXTREMITY ANGIOGRAPHY Right 08/10/2023   Procedure: Lower Extremity Angiography;  Surgeon: Marea Selinda RAMAN, MD;  Location: [facility] INVASIVE CV LAB;  Service: Cardiovascular;  Laterality: Right;   PERIPHERAL VASCULAR BALLOON ANGIOPLASTY Right 09/21/2017   Procedure: PERIPHERAL VASCULAR BALLOON ANGIOPLASTY;  Surgeon: Marea Selinda RAMAN, MD;  Location: [facility] INVASIVE CV LAB;  Service: Cardiovascular;  Laterality: Right;   REVERSE SHOULDER ARTHROPLASTY Right 11/26/2019   Procedure: REVERSE SHOULDER ARTHROPLASTY;  Surgeon: Edie Norleen PARAS, MD;  Location: [facility] ORS;  Service: Orthopedics;  Laterality: Right;   SUBMUCOSAL TATTOO INJECTION  10/27/2023   Procedure: SUBMUCOSAL TATTOO INJECTION;  Surgeon: Unk Corinn Skiff, MD;  Location: [facility] ENDOSCOPY;  Service: Gastroenterology;;   Family History  Problem Relation Age of Onset   Bladder Cancer Mother    Prostate cancer Father    Prostate cancer Brother    Diabetes Maternal Grandfather    Social History   Tobacco Use   Smoking status: Former    Current packs/day: 0.00    Types: Cigarettes    Quit date: 2001    Years since quitting: 24.8   Smokeless tobacco: Never  Substance Use Topics   Alcohol use: Not Currently   LABS:  Component Ref Range & Units 07/30/2024  WBC (White Blood Cell Count) 4.1 - 10.2 10^3/uL 5.8  RBC (Red Blood Cell Count) 4.69 - 6.13 10^6/uL 5.12  Hemoglobin 14.1 - 18.1 gm/dL 84.7  Hematocrit 59.9 - 52.0 % 45.6  MCV (Mean Corpuscular Volume) 80.0 - 100.0 fl 89.1  MCH (Mean Corpuscular Hemoglobin) 27.0 - 31.2 pg 29.7  MCHC (Mean Corpuscular Hemoglobin Concentration) 32.0 - 36.0 gm/dL 66.6  Platelet Count 849 - 450 10^3/uL 192  RDW-CV (Red Cell Distribution  Width) 11.6 - 14.8 % 13.3  MPV (Mean Platelet Volume) 9.4 - 12.4 fl 10.5  Neutrophils 1.50 - 7.80 10^3/uL 3.55  Lymphocytes 1.00 - 3.60 10^3/uL 0.99 Low   Monocytes 0.00 - 1.50 10^3/uL 0.58  Eosinophils 0.00 - 0.55 10^3/uL 0.57 High   Basophils 0.00 - 0.09 10^3/uL 0.08  Neutrophil % 32.0 - 70.0 % 61.1  Lymphocyte % 10.0 - 50.0 % 17.0  Monocyte % 4.0 - 13.0 % 10.0  Eosinophil % 1.0 - 5.0 % 9.8 High   Basophil% 0.0 - 2.0 % 1.4  Immature Granulocyte % <=0.7 % 0.7  Immature Granulocyte Count <=0.06 10^3/L 0.04   Component Ref Range & Units 07/30/2024  Glucose 70 - 110 mg/dL 99  Sodium 863 - 854 mmol/L 140  Potassium 3.6 - 5.1 mmol/L 4.0  Chloride 97 - 109 mmol/L 104  Carbon Dioxide (CO2) 22.0 - 32.0 mmol/L 27.3  Calcium  8.7 - 10.3 mg/dL 8.9  Urea  Nitrogen (BUN) 7 - 25 mg/dL 33 High   Creatinine 0.7 - 1.3 mg/dL 1.4 High  Glomerular Filtration Rate (eGFR) >60 mL/min/1.73sq m 49 Low   BUN/Crea Ratio 6.0 - 20.0 23.6 High   Anion Gap w/K 6.0 - 16.0 12.7    ECG: Date: 09/13/2024  Time ECG obtained: 0858 AM Rate: 84 bpm Rhythm: normal sinus Axis (leads I and aVF): normal Intervals: PR 122 ms. QRS 88 ms. QTc 451 ms. ST segment and T wave changes: No evidence of acute T wave abnormalities or significant ST segment elevation or depression.  Evidence of a possible, age undetermined, prior infarct:  No Comparison: Similar to previous tracing obtained on 10/25/2023   IMAGING / PROCEDURES: VAS US  ABI WITH/WO TBI performed on 08/06/2024 Resting right ankle-brachial index indicates moderate right lower extremity arterial disease.  Resting left ankle-brachial index indicates moderate left lower extremity arterial disease. The left toe-brachial index is abnormal.    TRANSTHORACIC ECHOCARDIOGRAM performed on 10/27/2023 Left ventricular ejection fraction, by estimation, is 60 to 65%. The left ventricle has normal function. The left ventricle has no regional wall  motion abnormalities. There is moderate asymmetric left ventricular hypertrophy of the basal-septal segment. Left ventricular diastolic parameters are indeterminate.  Right ventricular systolic function is normal. The right ventricular size is normal. There is normal pulmonary artery systolic pressure. The estimated right ventricular systolic pressure is 25.3 mmHg.  The mitral valve is degenerative. Mild mitral valve regurgitation. No evidence of mitral stenosis. Severe mitral annular calcification.  The aortic valve is abnormal. There is moderate calcification of the aortic valve. Aortic valve regurgitation is trivial. Aortic valve sclerosis/calcification is present, without any evidence of aortic stenosis on this exam. AV gradient interrogated from apical window only, consider Doppler evaluation from multiple acoustic windows to further evaluate for aortic valve stenosis if clinically relevant.  The inferior vena cava is normal in size with greater than 50% respiratory variability, suggesting right atrial pressure of 3 mmHg.   CT ANGIO GI BLEED performed on 10/25/2023 No definite evidence of active gastrointestinal bleeding. 4.3 cm infrarenal abdominal aortic aneurysm. Recommend follow-up CT or MR as appropriate in 12 months and referral to or continued care with vascular specialist. (Ref.: J Vasc Surg. 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 Mild stenosis is noted at origin of left renal artery. 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 Aortic atherosclerosis Minimal cholelithiasis. Sigmoid diverticulosis without bleeding. Mild prostatic enlargement. Chronic right hydronephrosis.  VAS US  CAROTID performed on 08/04/2023 Velocities in the right ICA are consistent with a 1-39% stenosis.  Velocities in the left ICA are consistent with a 1-39% stenosis.  Bilateral vertebral arteries  demonstrate antegrade flow.  Normal flow hemodynamics were seen in bilateral subclavian arteries.   IMPRESSION AND PLAN: BREKKEN BEACH has been referred for pre-anesthesia review and clearance prior to him undergoing the planned anesthetic and procedural courses. Available labs, pertinent testing, and imaging results were personally reviewed by me in preparation for upcoming operative/procedural course. [facility] medical record has been updated following extensive record review and patient interview with PAT staff.   This patient has been appropriately cleared by cardiology (LOW), internal medicine (LOW), and by vascular surgery (LOW) with the individually indicated risk of patient experiencing significant perioperative cardiovascular complications. Based on clinical review performed today (09/13/24), barring any significant acute changes in the patient's overall condition, it is anticipated that he will be able to proceed with the planned surgical intervention. Any acute changes in clinical condition may necessitate his procedure being postponed and/or cancelled.  Patient will meet with anesthesia team (MD and/or CRNA) on the day of his procedure for preoperative evaluation/assessment. Questions regarding anesthetic course will be fielded at that time.   Pre-surgical instructions were reviewed with the patient during his PAT appointment, and questions were fielded to satisfaction by PAT clinical staff. He has been instructed on which medications that he will need to hold prior to surgery, as well as the ones that have been deemed safe/appropriate to take on the day of his procedure. As part of the general education provided by PAT, patient made aware both verbally and in writing, that he would need to abstain from the use of any illegal substances during his perioperative course. He was advised that failure to follow the provided instructions could necessitate case cancellation or result in serious  perioperative complications up to and including death. Patient encouraged to contact PAT and/or his surgeon's office to discuss any questions or concerns that may arise prior to surgery; verbalized understanding.   Dorise Pereyra, MSN, APRN, FNP-C, CEN [facility]  Perioperative Services Nurse Practitioner Phone: [phone] Fax: [phone] 09/13/24 3:12 PM  NOTE: This note has been prepared using Dragon dictation software. Despite my best ability to proofread, there is always the potential that unintentional transcriptional errors may still occur from this process.

## 2024-09-17 ENCOUNTER — Other Ambulatory Visit: Payer: Self-pay

## 2024-09-17 ENCOUNTER — Ambulatory Visit: Admission: RE | Admit: 2024-09-17 | Discharge: 2024-09-17 | Disposition: A | Discharge status: Home / Self Care

## 2024-09-17 ENCOUNTER — Encounter: Admission: RE | Disposition: A | Discharge status: Home / Self Care | Payer: Self-pay | Source: Home / Self Care

## 2024-09-17 ENCOUNTER — Ambulatory Visit

## 2024-09-17 ENCOUNTER — Ambulatory Visit: Payer: Self-pay | Admitting: Urgent Care

## 2024-09-17 DIAGNOSIS — M7021 Olecranon bursitis, right elbow: Secondary | ICD-10-CM | POA: Insufficient documentation

## 2024-09-17 DIAGNOSIS — Z87891 Personal history of nicotine dependence: Secondary | ICD-10-CM | POA: Insufficient documentation

## 2024-09-17 DIAGNOSIS — N400 Enlarged prostate without lower urinary tract symptoms: Secondary | ICD-10-CM | POA: Insufficient documentation

## 2024-09-17 DIAGNOSIS — I70229 Atherosclerosis of native arteries of extremities with rest pain, unspecified extremity: Secondary | ICD-10-CM

## 2024-09-17 DIAGNOSIS — I129 Hypertensive chronic kidney disease with stage 1 through stage 4 chronic kidney disease, or unspecified chronic kidney disease: Secondary | ICD-10-CM | POA: Diagnosis not present

## 2024-09-17 DIAGNOSIS — I700 Atherosclerosis of aorta: Secondary | ICD-10-CM | POA: Insufficient documentation

## 2024-09-17 DIAGNOSIS — I2510 Atherosclerotic heart disease of native coronary artery without angina pectoris: Secondary | ICD-10-CM | POA: Diagnosis not present

## 2024-09-17 DIAGNOSIS — J449 Chronic obstructive pulmonary disease, unspecified: Secondary | ICD-10-CM | POA: Diagnosis not present

## 2024-09-17 DIAGNOSIS — I739 Peripheral vascular disease, unspecified: Secondary | ICD-10-CM | POA: Insufficient documentation

## 2024-09-17 DIAGNOSIS — K219 Gastro-esophageal reflux disease without esophagitis: Secondary | ICD-10-CM | POA: Diagnosis not present

## 2024-09-17 DIAGNOSIS — Z01810 Encounter for preprocedural cardiovascular examination: Secondary | ICD-10-CM

## 2024-09-17 DIAGNOSIS — M1990 Unspecified osteoarthritis, unspecified site: Secondary | ICD-10-CM | POA: Diagnosis not present

## 2024-09-17 DIAGNOSIS — Z7902 Long term (current) use of antithrombotics/antiplatelets: Secondary | ICD-10-CM | POA: Diagnosis not present

## 2024-09-17 DIAGNOSIS — E785 Hyperlipidemia, unspecified: Secondary | ICD-10-CM | POA: Diagnosis not present

## 2024-09-17 DIAGNOSIS — Z9861 Coronary angioplasty status: Secondary | ICD-10-CM | POA: Insufficient documentation

## 2024-09-17 DIAGNOSIS — I714 Abdominal aortic aneurysm, without rupture, unspecified: Secondary | ICD-10-CM | POA: Diagnosis not present

## 2024-09-17 DIAGNOSIS — I701 Atherosclerosis of renal artery: Secondary | ICD-10-CM | POA: Insufficient documentation

## 2024-09-17 DIAGNOSIS — N183 Chronic kidney disease, stage 3 unspecified: Secondary | ICD-10-CM | POA: Diagnosis not present

## 2024-09-17 DIAGNOSIS — F419 Anxiety disorder, unspecified: Secondary | ICD-10-CM | POA: Diagnosis not present

## 2024-09-17 DIAGNOSIS — I252 Old myocardial infarction: Secondary | ICD-10-CM | POA: Diagnosis not present

## 2024-09-17 DIAGNOSIS — I214 Non-ST elevation (NSTEMI) myocardial infarction: Secondary | ICD-10-CM

## 2024-09-17 HISTORY — DX: Gastro-esophageal reflux disease without esophagitis: K21.9

## 2024-09-17 HISTORY — DX: Chronic kidney disease, stage 3 unspecified: N18.30

## 2024-09-17 HISTORY — DX: Calculus of gallbladder without cholecystitis without obstruction: K80.20

## 2024-09-17 HISTORY — DX: Unspecified cataract: H26.9

## 2024-09-17 HISTORY — PX: OLECRANON BURSECTOMY: SHX2097

## 2024-09-17 HISTORY — DX: Long term (current) use of aspirin: Z79.82

## 2024-09-17 HISTORY — DX: Calculus of kidney: N20.0

## 2024-09-17 HISTORY — DX: Benign prostatic hyperplasia without lower urinary tract symptoms: N40.0

## 2024-09-17 HISTORY — DX: Psoriasis, unspecified: L40.9

## 2024-09-17 HISTORY — DX: Anxiety disorder, unspecified: F41.9

## 2024-09-17 HISTORY — DX: Disorder of arteries and arterioles, unspecified: I77.9

## 2024-09-17 HISTORY — DX: Diverticulosis of large intestine without perforation or abscess without bleeding: K57.30

## 2024-09-17 HISTORY — DX: Long term (current) use of anticoagulants: Z79.01

## 2024-09-17 HISTORY — DX: Chronic obstructive pulmonary disease, unspecified: J44.9

## 2024-09-17 HISTORY — DX: Polyp of colon: K63.5

## 2024-09-17 HISTORY — DX: Male erectile dysfunction, unspecified: N52.9

## 2024-09-17 SURGERY — BURSECTOMY, ELBOW
Anesthesia: General | Site: Elbow | Laterality: Right

## 2024-09-17 MED ORDER — LIDOCAINE HCL (PF) 2 % IJ SOLN
INTRAMUSCULAR | Status: AC
  Filled 2024-09-17: qty 5

## 2024-09-17 MED ORDER — PHENYLEPHRINE HCL-NACL 20-0.9 MG/250ML-% IV SOLN
INTRAVENOUS | Status: AC
  Filled 2024-09-17: qty 250

## 2024-09-17 MED ORDER — 0.9 % SODIUM CHLORIDE (POUR BTL) OPTIME
TOPICAL | Status: DC | PRN
  Administered 2024-09-17: 500 mL

## 2024-09-17 MED ORDER — PHENYLEPHRINE HCL-NACL 20-0.9 MG/250ML-% IV SOLN
INTRAVENOUS | Status: DC | PRN
Start: 2024-09-17 — End: 2024-09-17
  Administered 2024-09-17: 25 ug/min via INTRAVENOUS

## 2024-09-17 MED ORDER — ORAL CARE MOUTH RINSE: 15.0000 mL | Freq: Once | OROMUCOSAL | Status: AC

## 2024-09-17 MED ORDER — ACETAMINOPHEN 10 MG/ML IV SOLN
INTRAVENOUS | Status: DC | PRN
  Administered 2024-09-17: 1000 mg via INTRAVENOUS

## 2024-09-17 MED ORDER — BUPIVACAINE HCL (PF) 0.5 % IJ SOLN
INTRAMUSCULAR | Status: DC | PRN
  Administered 2024-09-17: 20 mL via PERINEURAL

## 2024-09-17 MED ORDER — BUPIVACAINE HCL (PF) 0.5 % IJ SOLN
INTRAMUSCULAR | Status: AC
Start: 2024-09-17 — End: 2024-09-17
  Filled 2024-09-17: qty 20

## 2024-09-17 MED ORDER — CEFAZOLIN SODIUM-DEXTROSE 2-4 GM/100ML-% IV SOLN
2.0000 g | INTRAVENOUS | Status: AC
  Administered 2024-09-17: 2 g via INTRAVENOUS

## 2024-09-17 MED ORDER — ACETAMINOPHEN 10 MG/ML IV SOLN
INTRAVENOUS | Status: AC
  Filled 2024-09-17: qty 100

## 2024-09-17 MED ORDER — CEFAZOLIN SODIUM-DEXTROSE 2-4 GM/100ML-% IV SOLN
INTRAVENOUS | Status: AC
  Filled 2024-09-17: qty 100

## 2024-09-17 MED ORDER — PROPOFOL 500 MG/50ML IV EMUL
INTRAVENOUS | Status: DC | PRN
  Administered 2024-09-17: 75 ug/kg/min via INTRAVENOUS

## 2024-09-17 MED ORDER — FENTANYL CITRATE (PF) 50 MCG/ML IJ SOSY
PREFILLED_SYRINGE | INTRAMUSCULAR | Status: AC
  Filled 2024-09-17: qty 1

## 2024-09-17 MED ORDER — LACTATED RINGERS IV SOLN: INTRAVENOUS | Status: DC

## 2024-09-17 MED ORDER — CHLORHEXIDINE GLUCONATE 0.12 % MT SOLN
OROMUCOSAL | Status: AC
  Filled 2024-09-17: qty 15

## 2024-09-17 MED ORDER — CHLORHEXIDINE GLUCONATE 0.12 % MT SOLN
15.0000 mL | Freq: Once | OROMUCOSAL | Status: AC
  Administered 2024-09-17: 15 mL via OROMUCOSAL

## 2024-09-17 MED ORDER — OXYCODONE HCL 5 MG PO TABS: 5.0000 mg | ORAL_TABLET | Freq: Three times a day (TID) | ORAL | 0 refills | Status: AC | PRN

## 2024-09-17 MED ORDER — MIDAZOLAM HCL (PF) 2 MG/2ML IJ SOLN
INTRAMUSCULAR | Status: DC | PRN
  Administered 2024-09-17: 2 mg via INTRAVENOUS

## 2024-09-17 SURGICAL SUPPLY — 30 items
BNDG COHESIVE 4X5 TAN STRL LF (GAUZE/BANDAGES/DRESSINGS) ×1 IMPLANT
BNDG ESMARCH 4X12 STRL LF (GAUZE/BANDAGES/DRESSINGS) ×1 IMPLANT
CHLORAPREP W/TINT 26 (MISCELLANEOUS) ×1 IMPLANT
CUFF TOURN SGL QUICK 18X4 (TOURNIQUET CUFF) IMPLANT
CUFF TRNQT CYL 24X4X16.5-23 (TOURNIQUET CUFF) IMPLANT
DRAPE SHEET LG 3/4 BI-LAMINATE (DRAPES) ×1 IMPLANT
ELECT CAUTERY BLADE 6.4 (BLADE) ×1 IMPLANT
ELECTRODE REM PT RTRN 9FT ADLT (ELECTROSURGICAL) ×1 IMPLANT
GAUZE SPONGE 4X4 12PLY STRL (GAUZE/BANDAGES/DRESSINGS) ×1 IMPLANT
GAUZE XEROFORM 1X8 LF (GAUZE/BANDAGES/DRESSINGS) ×1 IMPLANT
GLOVE BIO SURGEON STRL SZ7.5 (GLOVE) ×1 IMPLANT
GLOVE BIOGEL PI IND STRL 8 (GLOVE) ×1 IMPLANT
GOWN SRG XL LVL 3 NONREINFORCE (GOWNS) ×1 IMPLANT
KIT TURNOVER KIT A (KITS) ×1 IMPLANT
MANIFOLD NEPTUNE II (INSTRUMENTS) ×1 IMPLANT
NDL FILTER BLUNT 18X1 1/2 (NEEDLE) ×1 IMPLANT
NEEDLE FILTER BLUNT 18X1 1/2 (NEEDLE) ×1 IMPLANT
NS IRRIG 500ML POUR BTL (IV SOLUTION) ×1 IMPLANT
PACK EXTREMITY ARMC (MISCELLANEOUS) ×1 IMPLANT
PAD ABD DERMACEA PRESS 5X9 (GAUZE/BANDAGES/DRESSINGS) ×1 IMPLANT
PAD CAST 4YDX4 CTTN HI CHSV (CAST SUPPLIES) ×1 IMPLANT
PADDING CAST BLEND 4X4 STRL (MISCELLANEOUS) ×1 IMPLANT
PENCIL SMOKE EVACUATOR (MISCELLANEOUS) ×1 IMPLANT
SPONGE T-LAP 18X18 ~~LOC~~+RFID (SPONGE) ×1 IMPLANT
STOCKINETTE IMPERVIOUS 9X36 MD (GAUZE/BANDAGES/DRESSINGS) ×1 IMPLANT
STRIP CLOSURE SKIN 1/2X4 (GAUZE/BANDAGES/DRESSINGS) ×1 IMPLANT
SUT VIC AB 3-0 PS2 18XBRD (SUTURE) ×1 IMPLANT
SUT VIC AB 3-0 SH 27X BRD (SUTURE) IMPLANT
TRAP FLUID SMOKE EVACUATOR (MISCELLANEOUS) ×1 IMPLANT
WATER STERILE IRR 500ML POUR (IV SOLUTION) ×1 IMPLANT

## 2024-09-17 NOTE — Anesthesia Postprocedure Evaluation (Signed)
 Anesthesia Post Note  [name]  Procedure(s) Performed: BURSECTOMY, ELBOW (Right: Elbow)  Patient location during evaluation: PACU Anesthesia Type: General Level of consciousness: awake and alert Pain management: pain level controlled Vital Signs Assessment: post-procedure vital signs reviewed and stable Respiratory status: spontaneous breathing, nonlabored ventilation and respiratory function stable Cardiovascular status: blood pressure returned to baseline and stable Postop Assessment: no apparent nausea or vomiting Anesthetic complications: no   No notable events documented.   Last Vitals:  Vitals:   09/17/24 1030 09/17/24 1056  BP: (!) 121/54 108/72  Pulse: 66 66  Resp: 20 17  Temp:  (!) 36.3 C  SpO2: 95% 95%    Last Pain:  Vitals:   09/17/24 1056  TempSrc: Temporal  PainSc: 0-No pain                 Camellia Merilee Louder

## 2024-09-17 NOTE — Interval H&P Note (Signed)
 History and Physical Interval Note:  09/17/2024 8:41 AM  Bryan Welch  has presented today for surgery, with the diagnosis of Olecranon bursitis of right elbow M70.21.  The various methods of treatment have been discussed with the patient and family. After consideration of risks, benefits and other options for treatment, the patient has consented to  Procedure(s): BURSECTOMY, ELBOW (Right) as a surgical intervention.  [name] history has been reviewed, patient examined, no change in status, stable for surgery.  I have reviewed the patient's chart and labs.  Questions were answered to the patient's satisfaction.     Jackquline GORMAN Barrack

## 2024-09-17 NOTE — Op Note (Signed)
 Date of procedure:  09/17/2024  Surgeon:  Jackquline CANDIE Barrack, MD   Procedure(s) performed:   Procedure(s): BURSECTOMY, ELBOW (Right) (CPT [id number])  Assistant(s): none   Preoperative diagnosis:   Olecranon bursitis of right elbow M70.21    Postoperative diagnosis:   Olecranon bursitis of right elbow M70.21    Procedure findings: as above  Anesthesia:  Regional + MAC   Estimated blood loss:  15 cc   Specimen:  Olecranon bursa    Complications:  None apparent   Implants: None   Indications: 88 y.o. year old male with right chronic olecranon bursitis.  He has a large and painful bursa that has failed a long period of conservative management.  We discussed operative treatment and explained the risks, benefits, and alternatives as well as the expected postoperative course.  The patient elected to proceed with surgery.   Procedure in detail:  The patient was met in the pre-op holding area and the right upper extremity was marked and the consent confirmed.  Anesthesia placed a short acting regional block.  The patient was then taken to the operating room and the hand table was attached to the stretcher.  Antibiotics were administered prior to incision.  All bony prominences were well padded.  The operative extremity was then prepped and draped in the usual sterile fashion.  A timeout was performed confirming the correct patient, site, and procedure.   A posterior incision over the elbow was made.  I dissected down to the olecranon bursa.  This was freed and mobilized from the surrounding soft tissue.  Care was taken to not disrupt the triceps tendon.  The bursa was then excised from the surrounding tissue.  This was sent to the lab as a specimen. The bursa contained chalky material. Electrocautery was applied to the surrounding tissue and base of the olecranon.The wound was irrigated with normal saline. The tourniquet was let down and meticulous hemostasis was obtained. The incision was closed  with 3-0 Vicryl and 2-0 nylon.  Sterile dressings were applied.  The patient was transferred to the PACU in stable condition.  Counts were correct x2.   Postoperative plan: Maintain dressing until follow-up appointment Range of motion of the elbow and extremity as tolerated We will remove the sutures at his first postoperative appointment in 10 to 14 days

## 2024-09-17 NOTE — Anesthesia Procedure Notes (Signed)
 Date/Time: 09/17/2024 8:56 AM  Performed by: Duwayne Craven, CRNAPre-anesthesia Checklist: Patient identified, Emergency Drugs available, Suction available, Patient being monitored and Timeout performed Patient Re-evaluated:Patient Re-evaluated prior to induction Oxygen Delivery Method: Simple face mask Placement Confirmation: CO2 detector and positive ETCO2

## 2024-09-17 NOTE — Discharge Instructions (Signed)
 Discharge Instructions After Surgery   Diet   Return to your regular diet as tolerated.   Dressings   Do not remove your dressing. Keep it on until your first postop appointment.  It is okay to have a small spot of blood on your dressing as long as it does not keep getting bigger.   Cover the dressing and keep it dry in the shower or bath. Keep your arm up and out of the water.   Do not put any ointments, creams, alcohol, or hydrogen peroxide on your incisions.  Activity   If your fingers are free, work on making a full fist and straightening out the fingers within the limits of your dressing.  Move other joints such as your elbow or shoulder multiple times a day to prevent getting stiff.  No lifting, carrying, pushing, or pulling with your arm until your follow-up visit. You may use your hand or fingers for simple things like writing, typing, eating, and brushing your teeth.   If you were given a sling, wear it while your hand or arm is numb. You can stop wearing the sling when you have feeling and strength back in your hand or arm. You may also use the sling as needed to elevate, protect, or support your arm until your follow-up visit.   Pain   The first 48 hours after surgery can hurt the most. You should use the different types of pain medicine along with rest and elevation to help keep the pain manageable.   NSAIDS such as ibuprofen (Advil or Motrin) or naproxen (Aleve) can help with pain and swelling.   Acetaminophen  (Tylenol ) can be used with NSAIDs. Do not take more than 3000 mg of Tylenol  in a 24-hour period.   Narcotic pain medicine such as hydrocodone  or oxycodone , should be used as prescribed. Stop taking as soon as possible. Take with food to help prevent nausea. Do not drink alcohol or drive while taking narcotics. Be aware that hydrocodone  tablets often already contain acetaminophen  (Tylenol ).  Elevating your operative extremity above the level of your heart will help  with pain and swelling. You can rest your arm on several pillows for support while sleeping or resting.   Constipation   Narcotic pain medicines, changes in eating and drinking, and less activity may cause constipation after surgery.   Over-the-counter stool softeners like docusate (Colace), Senna, or Miralax  can help. Follow the directions on the bottle.   Stay hydrated and try to eat high fiber foods.   When to call your surgeon   If your dressing gets wet or very dirty.   Fever more than 101 F.   Incision that is very red, swollen, draining pus, or feels hot.   Severe pain, not getting better with pain medicine.   Severe swelling, even with elevation.   If your fingers become cool, cold, or dark in color.   Bleeding that is getting bigger or soaking through the dressing.   Severe nausea and vomiting.   Problems using the bathroom or no bowel movement for 2-3 days.   For questions or concerns, please call [phone].  Jackquline CANDIE Barrack, MD Orthopaedic Surgery [facility]

## 2024-09-17 NOTE — Anesthesia Procedure Notes (Signed)
 Anesthesia Regional Block: Supraclavicular block   Pre-Anesthetic Checklist: , timeout performed,  Correct Patient, Correct Site, Correct Laterality,  Correct Procedure, Correct Position, site marked,  Risks and benefits discussed,  Surgical consent,  Pre-op evaluation,  At surgeon's request and post-op pain management  Laterality: Upper and Right  Prep: chloraprep       Needles:  Injection technique: Single-shot  Needle Type: Stimiplex     Needle Length: 9cm  Needle Gauge: 22     Additional Needles:   Procedures:,,,, ultrasound used (permanent image in chart),,    Narrative:  Start time: 09/17/2024 8:05 AM End time: 09/17/2024 8:12 AM Injection made incrementally with aspirations every 5 mL.  Performed by: Personally  Anesthesiologist: Vicci Camellia Glatter, MD  Additional Notes: Patient consented for risk and benefits of nerve block including but not limited to nerve damage, failed block, bleeding and infection.  Patient voiced understanding.  Functioning IV was confirmed and monitors were applied.  Timeout done prior to procedure and prior to any sedation being given to the patient.  Patient confirmed procedure site prior to any sedation given to the patient. Sterile prep,hand hygiene and sterile gloves were used.  Minimal sedation used for procedure.  No paresthesia endorsed by patient during the procedure.  Negative aspiration and negative test dose prior to incremental administration of local anesthetic. The patient tolerated the procedure well with no immediate complications.

## 2024-09-17 NOTE — Anesthesia Preprocedure Evaluation (Addendum)
 Anesthesia Evaluation  Patient identified by MRN, date of birth, ID band Patient awake    Reviewed: Allergy & Precautions, H&P , NPO status , Patient's Chart, lab work & pertinent test results  Airway Mallampati: II  TM Distance: >3 FB Neck ROM: full    Dental  (+) Edentulous Upper, Edentulous Lower   Pulmonary COPD, former smoker   Pulmonary exam normal        Cardiovascular Exercise Tolerance: Good hypertension, + CAD, + Past MI and + Peripheral Vascular Disease (s/p RICA stent placement)  Normal cardiovascular exam     Neuro/Psych  PSYCHIATRIC DISORDERS Anxiety     negative neurological ROS     GI/Hepatic negative GI ROS, Neg liver ROS,,,  Endo/Other  negative endocrine ROS    Renal/GU Renal InsufficiencyRenal diseaseUPJ (ureteropelvic junction) obstruction  negative genitourinary   Musculoskeletal   Abdominal   Peds  Hematology negative hematology ROS (+)   Anesthesia Other Findings PAT note reviewed  Past Medical History: 04/05/2017: AAA (abdominal aortic aneurysm) without rupture No date: Anemia No date: Anxiety     Comment:  a.) takes BZO (alprazolam ) as needed No date: Arthritis 08/10/2021: Atherosclerosis of native arteries of extremity with  intermittent claudication     Comment:  4 in legs No date: Bilateral cataracts No date: BPH (benign prostatic hyperplasia) No date: Carotid arterial disease     Comment:  a.) s/p RICA stent placement No date: Cholelithiasis No date: CKD (chronic kidney disease), stage III (HCC)     Comment:  right kidney doesn't work No date: Colon polyps No date: COPD (chronic obstructive pulmonary disease) (HCC) No date: Coronary artery disease No date: Erectile dysfunction No date: GERD (gastroesophageal reflux disease) No date: History of hiatal hernia No date: Hyperlipidemia No date: Hypertension 05/08/2023: Hypokalemia No date: Long-term use of aspirin   therapy No date: Nephrolithiasis 05/25/2020: Olecranon bursitis, right elbow No date: On long term clopidogrel  therapy 03/22/2023: Penetrating atherosclerotic ulcer of aorta 04/05/2017: Peripheral vascular disease No date: Psoriasis No date: Sigmoid diverticulosis 10/25/2023: Type 2 MI (myocardial infarction) in setting of anemia 05/10/2023: Upper gastrointestinal hemorrhage due to angiodysplasia No date: Wears dentures     Comment:  full upper and lower  Past Surgical History: 09/16/2021: AMPUTATION TOE; Right     Comment:  Procedure: AMPUTATION TOE;  Surgeon: Lennie Barter, DPM;              Location: [facility];  Service: Podiatry;                Laterality: Right; No date: BACK SURGERY No date: CAROTID STENT; Right 06/17/2019: CATARACT EXTRACTION W/PHACO; Left     Comment:  Procedure: CATARACT EXTRACTION PHACO AND INTRAOCULAR               LENS PLACEMENT (IOC) LEFT;  Surgeon: Myrna Adine Anes,               MD;  Location: [facility];  Service:               Ophthalmology;  Laterality: Left; 07/08/2019: CATARACT EXTRACTION W/PHACO; Right     Comment:  Procedure: CATARACT EXTRACTION PHACO AND INTRAOCULAR               LENS PLACEMENT (IOC) right  00:39.2  10.8%  5.02  ;                Surgeon: Myrna Adine Anes, MD;  Location: [facility];  Service: Ophthalmology;  Laterality:               Right; 04/13/2023: COLONOSCOPY WITH PROPOFOL ; N/A     Comment:  Procedure: COLONOSCOPY WITH PROPOFOL ;  Surgeon: Onita Elspeth Sharper, DO;  Location: [facility];  Service:               Gastroenterology;  Laterality: N/A; 05/10/2023: ENTEROSCOPY; N/A     Comment:  Procedure: ENTEROSCOPY;  Surgeon: Jinny Carmine, MD;                Location: [facility];  Service: Endoscopy;                Laterality: N/A; 10/27/2023: ENTEROSCOPY; N/A     Comment:  Procedure: ENTEROSCOPY;  Surgeon: Unk Corinn Skiff,               MD;  Location:  [facility];  Service:               Gastroenterology;  Laterality: N/A; 04/13/2023: ESOPHAGOGASTRODUODENOSCOPY (EGD) WITH PROPOFOL ; N/A     Comment:  Procedure: ESOPHAGOGASTRODUODENOSCOPY (EGD) WITH               PROPOFOL ;  Surgeon: Onita Elspeth Sharper, DO;  Location:              [facility];  Service: Gastroenterology;  Laterality:               N/A; No date: EYE SURGERY 05/09/2023: GIVENS CAPSULE STUDY; N/A     Comment:  Procedure: GIVENS CAPSULE STUDY;  Surgeon: Jinny Carmine,              MD;  Location: [facility];  Service: Endoscopy;                Laterality: N/A; 10/27/2023: GIVENS CAPSULE STUDY     Comment:  Procedure: GIVENS CAPSULE STUDY;  Surgeon: Unk Corinn Skiff, MD;  Location: [facility];  Service:               Gastroenterology;; 05/10/2023: HOT HEMOSTASIS     Comment:  Procedure: HOT HEMOSTASIS (ARGON PLASMA               COAGULATION/BICAP);  Surgeon: Jinny Carmine, MD;                Location: [facility];  Service: Endoscopy;; No date: LEG SURGERY; Bilateral     Comment:  stent placement 08/19/2021: LOWER EXTREMITY ANGIOGRAPHY; Right     Comment:  Procedure: LOWER EXTREMITY ANGIOGRAPHY;  Surgeon: Marea Selinda RAMAN, MD;  Location: [facility];  Service:               Cardiovascular;  Laterality: Right; 08/09/2023: LOWER EXTREMITY ANGIOGRAPHY; Right     Comment:  Procedure: Lower Extremity Angiography;  Surgeon: Marea Selinda RAMAN, MD;  Location: [facility];  Service:               Cardiovascular;  Laterality: Right; 08/10/2023: LOWER EXTREMITY ANGIOGRAPHY; Right     Comment:  Procedure: Lower Extremity Angiography;  Surgeon: Marea,  Selinda RAMAN, MD;  Location: [facility];  Service:               Cardiovascular;  Laterality: Right; 09/21/2017: PERIPHERAL VASCULAR BALLOON ANGIOPLASTY; Right     Comment:  Procedure: PERIPHERAL VASCULAR BALLOON ANGIOPLASTY;                Surgeon: Marea Selinda RAMAN, MD;  Location: [facility]               [facility];  Service: Cardiovascular;  Laterality: Right; 11/26/2019: REVERSE SHOULDER ARTHROPLASTY; Right     Comment:  Procedure: REVERSE SHOULDER ARTHROPLASTY;  Surgeon:               Edie Norleen PARAS, MD;  Location: [facility];  Service:               Orthopedics;  Laterality: Right; 10/27/2023: SUBMUCOSAL TATTOO INJECTION     Comment:  Procedure: SUBMUCOSAL TATTOO INJECTION;  Surgeon: Unk Corinn Skiff, MD;  Location: [facility] END[facility];  Service:               Gastroenterology;;     Reproductive/Obstetrics negative OB ROS                              Anesthesia Physical Anesthesia Plan  ASA: 3  Anesthesia Plan: General   Post-op Pain Management: Regional block*   Induction: Intravenous  PONV Risk Score and Plan: Propofol  infusion and TIVA  Airway Management Planned: Natural Airway  Additional Equipment:   Intra-op Plan:   Post-operative Plan:   Informed Consent: I have reviewed the patients History and Physical, chart, labs and discussed the procedure including the risks, benefits and alternatives for the proposed anesthesia with the patient or authorized representative who has indicated his/her understanding and acceptance.     Dental Advisory Given  Plan Discussed with: CRNA and Surgeon  Anesthesia Plan Comments:          Anesthesia Quick Evaluation

## 2024-09-17 NOTE — Transfer of Care (Signed)
 Immediate Anesthesia Transfer of Care Note  Patient: MILLS MITTON  Procedure(s) Performed: BURSECTOMY, ELBOW (Right: Elbow)  Patient Location: PACU  Anesthesia Type:General  Level of Consciousness: drowsy  Airway & Oxygen Therapy: Patient Spontanous Breathing and Patient connected to face mask oxygen  Post-op Assessment: Report given to RN and Post -op Vital signs reviewed and stable  Post vital signs: Reviewed and stable  Last Vitals:  Vitals Value Taken Time  BP    Temp    Pulse 65 09/17/24 09:55  Resp 20 09/17/24 09:55  SpO2 99 % 09/17/24 09:55  Vitals shown include unfiled device data.  Last Pain:  Vitals:   09/17/24 0740  TempSrc: Temporal  PainSc: 0-No pain         Complications: No notable events documented.

## 2024-09-18 LAB — SURGICAL PATHOLOGY

## 2024-09-23 ENCOUNTER — Inpatient Hospital Stay: Attending: Internal Medicine

## 2024-09-23 ENCOUNTER — Encounter: Payer: Self-pay | Admitting: Internal Medicine

## 2024-09-23 ENCOUNTER — Inpatient Hospital Stay (HOSPITAL_BASED_OUTPATIENT_CLINIC_OR_DEPARTMENT_OTHER): Admitting: Internal Medicine

## 2024-09-23 ENCOUNTER — Inpatient Hospital Stay

## 2024-09-23 DIAGNOSIS — D649 Anemia, unspecified: Secondary | ICD-10-CM

## 2024-09-23 LAB — CBC WITH DIFFERENTIAL (CANCER CENTER ONLY)
Abs Immature Granulocytes: 0.1 K/uL — ABNORMAL HIGH (ref 0.00–0.07)
Basophils Absolute: 0.1 K/uL (ref 0.0–0.1)
Basophils Relative: 1 %
Eosinophils Absolute: 0.4 K/uL (ref 0.0–0.5)
Eosinophils Relative: 4 %
HCT: 46.8 % (ref 39.0–52.0)
Hemoglobin: 15.7 g/dL (ref 13.0–17.0)
Immature Granulocytes: 1 %
Lymphocytes Relative: 9 %
Lymphs Abs: 0.7 K/uL (ref 0.7–4.0)
MCH: 29.5 pg (ref 26.0–34.0)
MCHC: 33.5 g/dL (ref 30.0–36.0)
MCV: 88 fL (ref 80.0–100.0)
Monocytes Absolute: 0.7 K/uL (ref 0.1–1.0)
Monocytes Relative: 9 %
Neutro Abs: 6.1 K/uL (ref 1.7–7.7)
Neutrophils Relative %: 76 %
Platelet Count: 377 K/uL (ref 150–400)
RBC: 5.32 MIL/uL (ref 4.22–5.81)
RDW: 12.2 % (ref 11.5–15.5)
WBC Count: 8 K/uL (ref 4.0–10.5)
nRBC: 0 % (ref 0.0–0.2)

## 2024-09-23 LAB — CMP (CANCER CENTER ONLY)
ALT: 13 U/L (ref 0–44)
AST: 30 U/L (ref 15–41)
Albumin: 3.9 g/dL (ref 3.5–5.0)
Alkaline Phosphatase: 75 U/L (ref 38–126)
Anion gap: 14 (ref 5–15)
BUN: 38 mg/dL — ABNORMAL HIGH (ref 8–23)
CO2: 26 mmol/L (ref 22–32)
Calcium: 9.3 mg/dL (ref 8.9–10.3)
Chloride: 95 mmol/L — ABNORMAL LOW (ref 98–111)
Creatinine: 1.72 mg/dL — ABNORMAL HIGH (ref 0.61–1.24)
GFR, Estimated: 38 mL/min — ABNORMAL LOW (ref >60)
Glucose, Bld: 138 mg/dL — ABNORMAL HIGH (ref 70–99)
Potassium: 3.5 mmol/L (ref 3.5–5.1)
Sodium: 135 mmol/L (ref 135–145)
Total Bilirubin: 0.6 mg/dL (ref 0.0–1.2)
Total Protein: 7.6 g/dL (ref 6.5–8.1)

## 2024-09-23 LAB — IRON AND TIBC
Iron: 57 ug/dL (ref 45–182)
Saturation Ratios: 20 % (ref 17.9–39.5)
TIBC: 281 ug/dL (ref 250–450)
UIBC: 224 ug/dL

## 2024-09-23 LAB — FERRITIN: Ferritin: 240 ng/mL (ref 24–336)

## 2024-09-23 NOTE — Progress Notes (Signed)
 [facility] CONSULT NOTE  Patient Care Team: Rudolpho Norleen BIRCH, MD as PCP - General (Internal Medicine) Rennie Cindy SAUNDERS, MD as Consulting Physician (Oncology)  CHIEF COMPLAINTS/PURPOSE OF CONSULTATION: symptomatic anemia   Latest Reference Range & Units 01/18/24 12:52  Iron  45 - 182 ug/dL 23 (L)  UIBC ug/dL 620  TIBC 749 - 549 ug/dL 597  Saturation Ratios 17.9 - 39.5 % 6 (L)  Ferritin 24 - 336 ng/mL 8 (L)  (L): Data is abnormally low   HISTORY OF PRESENTING ILLNESS:  Patient ambulating-independently/with cane. Alone   [name] 88 y.o.  male pleasant patient with a anemia secondary to GI bleed chronic is here for follow-up.  Discussed the use of AI scribe software for clinical note transcription with the patient, who gave verbal consent to proceed.  History of Present Illness   DONIVEN VANPATTEN is an 88 year old male with anemia who presents for follow-up.  Today His hemoglobin level is 15.7, and he has not required blood transfusions or infusions in the past six months, with the last infusion in April.  He has a history of having only one kidney, with recent blood work showing a kidney function level of 38, slightly lower than his usual range of 40 to 50. He consumes water with electrolytes and urinates every 30 to 40 minutes. He avoids Advil or Motrin, using Tylenol  instead.  He recently underwent a procedure on his elbow and is scheduled for a follow-up visit next Monday. The area is swollen, and he noted it was larger yesterday, which was concerning.  He is currently on Plavix , having previously been on Xarelto .  No bleeding.       Review of Systems  Constitutional:  Positive for malaise/fatigue. Negative for chills, diaphoresis, fever and weight loss.  HENT:  Negative for nosebleeds and sore throat.   Eyes:  Negative for double vision.  Respiratory:  Negative for cough, hemoptysis, sputum production, shortness of breath and wheezing.    Cardiovascular:  Negative for chest pain, palpitations, orthopnea and leg swelling.  Gastrointestinal:  Negative for abdominal pain, blood in stool, constipation, diarrhea, heartburn, melena, nausea and vomiting.  Genitourinary:  Negative for dysuria, frequency and urgency.  Musculoskeletal:  Positive for joint pain. Negative for back pain.  Skin: Negative.  Negative for itching and rash.  Neurological:  Negative for dizziness, tingling, focal weakness, weakness and headaches.  Endo/Heme/Allergies:  Does not bruise/bleed easily.  Psychiatric/Behavioral:  Negative for depression. The patient is not nervous/anxious and does not have insomnia.     MEDICAL HISTORY:  Past Medical History:  Diagnosis Date   AAA (abdominal aortic aneurysm) without rupture 04/05/2017   Anemia    Anxiety    a.) takes BZO (alprazolam ) as needed   Arthritis    Atherosclerosis of native arteries of extremity with intermittent claudication 08/10/2021   4 in legs   Bilateral cataracts    BPH (benign prostatic hyperplasia)    Carotid arterial disease    a.) s/p RICA stent placement   Cholelithiasis    CKD (chronic kidney disease), stage III (HCC)    right kidney doesn't work   Colon polyps    COPD (chronic obstructive pulmonary disease) (HCC)    Coronary artery disease    Erectile dysfunction    GERD (gastroesophageal reflux disease)    History of hiatal hernia    Hyperlipidemia    Hypertension    Hypokalemia 05/08/2023   Long-term use of aspirin  therapy  Nephrolithiasis    Olecranon bursitis, right elbow 05/25/2020   On long term clopidogrel  therapy    Penetrating atherosclerotic ulcer of aorta 03/22/2023   Peripheral vascular disease 04/05/2017   Psoriasis    Sigmoid diverticulosis    Type 2 MI (myocardial infarction) in setting of anemia 10/25/2023   Upper gastrointestinal hemorrhage due to angiodysplasia 05/10/2023   Wears dentures    full upper and lower    SURGICAL HISTORY: Past  Surgical History:  Procedure Laterality Date   AMPUTATION TOE Right 09/16/2021   Procedure: AMPUTATION TOE;  Surgeon: Lennie Barter, DPM;  Location: [facility];  Service: Podiatry;  Laterality: Right;   BACK SURGERY     CAROTID STENT Right    CATARACT EXTRACTION W/PHACO Left 06/17/2019   Procedure: CATARACT EXTRACTION PHACO AND INTRAOCULAR LENS PLACEMENT (IOC) LEFT;  Surgeon: Myrna Adine Anes, MD;  Location: [facility];  Service: Ophthalmology;  Laterality: Left;   CATARACT EXTRACTION W/PHACO Right 07/08/2019   Procedure: CATARACT EXTRACTION PHACO AND INTRAOCULAR LENS PLACEMENT (IOC) right  00:39.2  10.8%  5.02  ;  Surgeon: Myrna Adine Anes, MD;  Location: [facility];  Service: Ophthalmology;  Laterality: Right;   COLONOSCOPY WITH PROPOFOL  N/A 04/13/2023   Procedure: COLONOSCOPY WITH PROPOFOL ;  Surgeon: Onita Elspeth Sharper, DO;  Location: [facility] ENDOSCOPY;  Service: Gastroenterology;  Laterality: N/A;   ENTEROSCOPY N/A 05/10/2023   Procedure: ENTEROSCOPY;  Surgeon: Jinny Carmine, MD;  Location: [facility] ENDOSCOPY;  Service: Endoscopy;  Laterality: N/A;   ENTEROSCOPY N/A 10/27/2023   Procedure: ENTEROSCOPY;  Surgeon: Unk Corinn Skiff, MD;  Location: [facility] ENDOSCOPY;  Service: Gastroenterology;  Laterality: N/A;   ESOPHAGOGASTRODUODENOSCOPY (EGD) WITH PROPOFOL  N/A 04/13/2023   Procedure: ESOPHAGOGASTRODUODENOSCOPY (EGD) WITH PROPOFOL ;  Surgeon: Onita Elspeth Sharper, DO;  Location: [facility] ENDOSCOPY;  Service: Gastroenterology;  Laterality: N/A;   EYE SURGERY     GIVENS CAPSULE STUDY N/A 05/09/2023   Procedure: GIVENS CAPSULE STUDY;  Surgeon: Jinny Carmine, MD;  Location: [facility] ENDOSCOPY;  Service: Endoscopy;  Laterality: N/A;   GIVENS CAPSULE STUDY  10/27/2023   Procedure: GIVENS CAPSULE STUDY;  Surgeon: Unk Corinn Skiff, MD;  Location: [facility] ENDOSCOPY;  Service: Gastroenterology;;   HOT HEMOSTASIS  05/10/2023   Procedure: HOT HEMOSTASIS (ARGON PLASMA COAGULATION/BICAP);   Surgeon: Jinny Carmine, MD;  Location: [facility] ENDOSCOPY;  Service: Endoscopy;;   LEG SURGERY Bilateral    stent placement   LOWER EXTREMITY ANGIOGRAPHY Right 08/19/2021   Procedure: LOWER EXTREMITY ANGIOGRAPHY;  Surgeon: Marea Selinda RAMAN, MD;  Location: [facility] INVASIVE CV LAB;  Service: Cardiovascular;  Laterality: Right;   LOWER EXTREMITY ANGIOGRAPHY Right 08/09/2023   Procedure: Lower Extremity Angiography;  Surgeon: Marea Selinda RAMAN, MD;  Location: [facility] INV[facility] CV LAB;  Service: Cardiovascular;  Laterality: Right;   LOWER EXTREMITY ANGIOGRAPHY Right 08/10/2023   Procedure: Lower Extremity Angiography;  Surgeon: Marea Selinda RAMAN, MD;  Location: [facility] INV[facility] CV LAB;  Service: Cardiovascular;  Laterality: Right;   OLECRANON BURSECTOMY Right 09/17/2024   Procedure: JENELLE SPAIN;  Surgeon: Ezra Jackquline RAMAN, MD;  Location: [facility] ORS;  Service: Orthopedics;  Laterality: Right;   PERIPHERAL VASCULAR BALLOON ANGIOPLASTY Right 09/21/2017   Procedure: PERIPHERAL VASCULAR BALLOON ANGIOPLASTY;  Surgeon: Marea Selinda RAMAN, MD;  Location: [facility] INV[facility] CV LAB;  Service: Cardiovascular;  Laterality: Right;   REVERSE SHOULDER ARTHROPLASTY Right 11/26/2019   Procedure: REVERSE SHOULDER ARTHROPLASTY;  Surgeon: Edie Norleen PARAS, MD;  Location: [facility] ORS;  Service: Orthopedics;  Laterality: Right;   SUBMUCOSAL TATTOO INJECTION  10/27/2023  Procedure: SUBMUCOSAL TATTOO INJECTION;  Surgeon: Unk Corinn Skiff, MD;  Location: [facility] ENDOSCOPY;  Service: Gastroenterology;;    SOCIAL HISTORY: Social History   Socioeconomic History   Marital status: Married    Spouse name: Dorothyann   Number of children: Not on file   Years of education: Not on file   Highest education level: Not on file  Occupational History   Not on file  Tobacco Use   Smoking status: Former    Current packs/day: 0.00    Types: Cigarettes    Quit date: 2001    Years since quitting: 24.8   Smokeless tobacco: Never  Vaping Use   Vaping status: Never Used   Substance and Sexual Activity   Alcohol use: Not Currently   Drug use: No   Sexual activity: Not on file  Other Topics Concern   Not on file  Social History Narrative   Lives with wife, Donny. No indoor pets.   Social Drivers of Corporate Investment Banker Strain: Low Risk  (09/13/2024)   Received from [facility]   Overall Financial Resource Strain (CARDIA)    Difficulty of Paying Living Expenses: Not hard at all  Recent Concern: Financial Resource Strain - Medium Risk (08/29/2024)   Received from [facility]   Overall Financial Resource Strain (CARDIA)    Difficulty of Paying Living Expenses: Somewhat hard  Food Insecurity: No Food Insecurity (09/13/2024)   Received from [facility]   Hunger Vital Sign    Within the past 12 months, you worried that your food would run out before you got the money to buy more.: Never true    Within the past 12 months, the food you bought just didn't last and you didn't have money to get more.: Never true  Transportation Needs: No Transportation Needs (09/13/2024)   Received from [facility] - Transportation    In the past 12 months, has lack of transportation kept you from medical appointments or from getting medications?: No    Lack of Transportation (Non-Medical): No  Physical Activity: Not on file  Stress: Not on file  Social Connections: Not on file  Intimate Partner Violence: Not At Risk (10/27/2023)   Humiliation, Afraid, Rape, and Kick questionnaire    Fear of Current or Ex-Partner: No    Emotionally Abused: No    Physically Abused: No    Sexually Abused: No    FAMILY HISTORY: Family History  Problem Relation Age of Onset   Bladder Cancer Mother    Prostate cancer Father    Prostate cancer Brother    Diabetes Maternal Grandfather     ALLERGIES:  is allergic to codeine.  MEDICATIONS:  Current Outpatient Medications  Medication Sig Dispense  Refill   acetaminophen  (TYLENOL ) 500 MG tablet Take 1,000 mg by mouth at bedtime.     ALPRAZolam  (XANAX ) 0.25 MG tablet Take 0.25 mg by mouth at bedtime.     aspirin  EC 81 MG tablet Take 81 mg daily by mouth.      Cholecalciferol  25 MCG (1000 UT) capsule Take 2,000 Units by mouth daily.     clopidogrel  (PLAVIX ) 75 MG tablet TAKE 1 TABLET BY MOUTH EVERY DAY 90 tablet 2   gabapentin  (NEURONTIN ) 300 MG capsule Take 300 mg by mouth 2 (two) times daily.     hydrochlorothiazide  (HYDRODIURIL ) 25 MG tablet Take 1 tablet by mouth daily.     Multiple Vitamins-Minerals (PRESERVISION AREDS 2  PO) Take 1 tablet by mouth 2 (two) times daily.     pantoprazole  (PROTONIX ) 40 MG tablet Take 40 mg by mouth daily.     potassium chloride  (KLOR-CON ) 10 MEQ tablet Take 10 mEq by mouth daily.     pravastatin  (PRAVACHOL ) 20 MG tablet Take 20 mg by mouth at bedtime.     tamsulosin  (FLOMAX ) 0.4 MG CAPS capsule Take 0.4 mg by mouth daily.  3   No current facility-administered medications for this visit.    PHYSICAL EXAMINATION:   Vitals:   09/23/24 1006  BP: 122/66  Pulse: 80  Resp: 16  Temp: 97.7 F (36.5 C)  SpO2: 95%   Filed Weights   09/23/24 1006  Weight: 136 lb 14.4 oz (62.1 kg)    Physical Exam Vitals and nursing note reviewed.  HENT:     Head: Normocephalic and atraumatic.     Mouth/Throat:     Pharynx: Oropharynx is clear.  Eyes:     Extraocular Movements: Extraocular movements intact.     Pupils: Pupils are equal, round, and reactive to light.  Cardiovascular:     Rate and Rhythm: Normal rate and regular rhythm.  Pulmonary:     Comments: Decreased breath sounds bilaterally.  Abdominal:     Palpations: Abdomen is soft.  Musculoskeletal:        General: Normal range of motion.     Cervical back: Normal range of motion.  Skin:    General: Skin is warm.  Neurological:     General: No focal deficit present.     Mental Status: He is alert and oriented to person, place, and time.   Psychiatric:        Behavior: Behavior normal.        Judgment: Judgment normal.     LABORATORY DATA:  I have reviewed the data as listed Lab Results  Component Value Date   WBC 8.0 09/23/2024   HGB 15.7 09/23/2024   HCT 46.8 09/23/2024   MCV 88.0 09/23/2024   PLT 377 09/23/2024   Recent Labs    10/25/23 0734 10/26/23 0423 10/27/23 0500 10/28/23 1251 09/23/24 0946  NA 139 139 139 135 135  K 3.4* 3.5 3.6 3.9 3.5  CL 106 108 107 106 95*  CO2 19* 20* 22 22 26   GLUCOSE 151* 105* 86 126* 138*  BUN 62* 52* 36* 31* 38*  CREATININE 1.63* 1.55* 1.27* 1.22 1.72*  CALCIUM  8.5* 8.0* 8.4* 8.2* 9.3  GFRNONAA 41* 44* 55* 58* 38*  PROT 5.9* 5.3*  --   --  7.6  ALBUMIN 3.2* 3.0*  --   --  3.9  AST 21 16  --   --  30  ALT 12 12  --   --  13  ALKPHOS 47 45  --   --  75  BILITOT 0.5 0.6  --   --  0.6  BILIDIR <0.1  --   --   --   --   IBILI NOT CALCULATED  --   --   --   --     RADIOGRAPHIC STUDIES: I have personally reviewed the radiological images as listed and agreed with the findings in the report. US  OR NERVE BLOCK-IMAGE ONLY [facility]) Result Date: 09/17/2024 There is no interpretation for this exam.  This order is for images obtained during a surgical procedure.  Please See Surgeries Tab for more information regarding the procedure.     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 Cannot tolerate PO iron  [diarrhea].   # HOLD venofe today- monitor for now.  # Etiology: History of AVMs- [Dr. Onita of GI.  Colonoscopy done on 04/13/2023 showed 14 mm polypoid lesion at the ileocecal valve.  Had difficulty intubating TI.  One 1 to 2 mm polyp in the cecum.  11 to 12 mm polyp in ascending colon.  One 2 to 3 mm polyp in the descending colon.  Pathology showed tubular adenoma. Upper endoscopy - Barrett's esophagus. VCE performed in July 2024 reportedly revealed multiple sites of bleeding in the proximal SB. A subsequent Push  Enteroscopy performed the next day revealed several angioectasias in the duodenum and jejunum that were treated.  10/25/2023 - readmitted with GI bleed.  Hemoglobin of 5.4. s/p 2 units PRBC. Underwent capsule endoscopy which showed bleeding cecal polyp;  Dr. Toribio Clas, [facility].  His GI bleeding is thought likely secondary to intermittent bleeding episodes from small bowel angioectasias.  At this time, he does not think any further intervention is needed based on his age and other comorbidities.  Xarelto  has been discontinued.  Switched to Plavix  by Dr. Marea which has less of a risk of GI bleed.  # PVD-currently on aspirin  and Plavix  [Dr. Dew]. Stable.    # CKD stage III- today lower GFR- recommend continued PO intake of fluids. NOT on NSAIDs. Awaiting repeating labs in 3 months with PCP.    # DISPOSITION: # HOLD venofer  today # Follow up in 6 months- MD; labs- cbc/cmp; iron  studies; ferritin; possible venofer - Dr.B   Above plan of care was discussed with patient/family in detail.  My contact information was given to the patient/family.       Cindy JONELLE Joe, MD 09/23/2024 11:09 AM

## 2024-09-23 NOTE — Progress Notes (Signed)
 Surgery on rt elbow last week, bursectomy, rt hand swollen. He states bandage was tight so he loosened it. Sees Dr. Ezra.

## 2024-09-23 NOTE — Assessment & Plan Note (Addendum)
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 Cannot tolerate PO iron  [diarrhea].   # HOLD venofe today- monitor for now.  # Etiology: History of AVMs- [Dr. Onita of GI.  Colonoscopy done on 04/13/2023 showed 14 mm polypoid lesion at the ileocecal valve.  Had difficulty intubating TI.  One 1 to 2 mm polyp in the cecum.  11 to 12 mm polyp in ascending colon.  One 2 to 3 mm polyp in the descending colon.  Pathology showed tubular adenoma. Upper endoscopy - Barrett's esophagus. VCE performed in July 2024 reportedly revealed multiple sites of bleeding in the proximal SB. A subsequent Push Enteroscopy performed the next day revealed several angioectasias in the duodenum and jejunum that were treated.  10/25/2023 - readmitted with GI bleed.  Hemoglobin of 5.4. s/p 2 units PRBC. Underwent capsule endoscopy which showed bleeding cecal polyp;  Dr. Toribio Clas, [facility].  His GI bleeding is thought likely secondary to intermittent bleeding episodes from small bowel angioectasias.  At this time, he does not think any further intervention is needed based on his age and other comorbidities.  Xarelto  has been discontinued.  Switched to Plavix  by Dr. Marea which has less of a risk of GI bleed.  # PVD-currently on aspirin  and Plavix  [Dr. Dew]. Stable.    # CKD stage III- today lower GFR- recommend continued PO intake of fluids. NOT on NSAIDs. Awaiting repeating labs in 3 months with PCP.    # DISPOSITION: # HOLD venofer  today # Follow up in 6 months- MD; labs- cbc/cmp; iron  studies; ferritin; possible venofer - Dr.B

## 2024-10-08 ENCOUNTER — Other Ambulatory Visit

## 2024-11-05 ENCOUNTER — Ambulatory Visit (INDEPENDENT_AMBULATORY_CARE_PROVIDER_SITE_OTHER): Admitting: Vascular Surgery

## 2024-11-05 ENCOUNTER — Ambulatory Visit (INDEPENDENT_AMBULATORY_CARE_PROVIDER_SITE_OTHER)

## 2024-11-05 ENCOUNTER — Encounter (INDEPENDENT_AMBULATORY_CARE_PROVIDER_SITE_OTHER): Payer: Self-pay | Admitting: Vascular Surgery

## 2024-11-05 VITALS — BP 169/71 | HR 71 | Resp 18 | Wt 138.4 lb

## 2024-11-05 DIAGNOSIS — I10 Essential (primary) hypertension: Secondary | ICD-10-CM

## 2024-11-05 DIAGNOSIS — I7143 Infrarenal abdominal aortic aneurysm, without rupture: Secondary | ICD-10-CM | POA: Diagnosis not present

## 2024-11-05 DIAGNOSIS — I6523 Occlusion and stenosis of bilateral carotid arteries: Secondary | ICD-10-CM

## 2024-11-05 DIAGNOSIS — E785 Hyperlipidemia, unspecified: Secondary | ICD-10-CM

## 2024-11-05 DIAGNOSIS — I70211 Atherosclerosis of native arteries of extremities with intermittent claudication, right leg: Secondary | ICD-10-CM

## 2024-11-05 NOTE — Progress Notes (Signed)
 "   MRN : [id number]  Bryan Welch is a 88 y.o. (1936/01/09) male who presents with chief complaint of  Chief Complaint  Patient presents with   Follow-up    3 month abi, AAA, carotid follow up  .  History of Present Illness: Patient returns today in follow up of multiple vascular issues  Discussed the use of AI scribe software for clinical note transcription with the patient, who gave verbal consent to proceed.  History of Present Illness Bryan Welch is an 88 year old male with infrarenal abdominal aortic aneurysm, bilateral lower extremity peripheral arterial disease, and bilateral carotid artery stenosis who presents for vascular surgery follow-up and evaluation of right foot pain.  He presents for routine vascular follow-up of his infrarenal abdominal aortic aneurysm and bilateral lower extremity arterial disease. Recent imaging shows the aneurysm is stable at 4.1 to 4.2 cm compared with prior 4.4 cm, with no new symptoms or interval changes.  This was measured about 3.9 cm 9 months ago.  He has significant right foot pain, worse than the left, and numbness in the left foot. Topical Valera cream to the right foot with an occlusive sock at night provides relief.  He has had multiple prior lower extremity angiograms and interventions. Recent studies show persistent reduced perfusion in the right leg with ABI 0.56, slightly improved from 0.50, and improved perfusion in the left leg with ABI 0.8 and only mild, stable numbness.  Recent carotid studies show normal velocities in the right carotid artery and mildly elevated velocities in the left carotid artery in the 40 to 59 percent range. He has no new neurological symptoms or other acute changes since the last evaluation.  He is status post right carotid stent in the past.    Results Radiology Abdominal aorta ultrasound (11/05/2024): Infrarenal abdominal aortic aneurysm measuring 4.1 to 4.2 cm, previously measured 3.8-3.9 cm 9  months ago and 4.4 cm 3 months ago on prior studies; measurement variability attributed to imaging angle. (Independently interpreted) Carotid duplex ultrasound, right and left (11/05/2024): Right: Normal velocities; no significant stenosis. Left: Velocities mildly elevated, within low end of 40-59% stenosis range. (Independently interpreted)  Diagnostic Ankle-brachial index, right leg (11/05/2024): 0.56, increased from 0.50 on prior study; persistent reduction in perfusion. Ankle-brachial index, left leg (11/05/2024): 0.80; improved compared to right leg.  Current Outpatient Medications  Medication Sig Dispense Refill   acetaminophen  (TYLENOL ) 500 MG tablet Take 1,000 mg by mouth at bedtime.     ALPRAZolam  (XANAX ) 0.25 MG tablet Take 0.25 mg by mouth at bedtime.     aspirin  EC 81 MG tablet Take 81 mg daily by mouth.      Cholecalciferol  25 MCG (1000 UT) capsule Take 2,000 Units by mouth daily.     clopidogrel  (PLAVIX ) 75 MG tablet TAKE 1 TABLET BY MOUTH EVERY DAY 90 tablet 2   gabapentin  (NEURONTIN ) 300 MG capsule Take 300 mg by mouth 2 (two) times daily.     hydrochlorothiazide  (HYDRODIURIL ) 25 MG tablet Take 1 tablet by mouth daily.     Multiple Vitamins-Minerals (PRESERVISION AREDS 2 PO) Take 1 tablet by mouth 2 (two) times daily.     pantoprazole  (PROTONIX ) 40 MG tablet Take 40 mg by mouth daily.     potassium chloride  (KLOR-CON ) 10 MEQ tablet Take 10 mEq by mouth daily.     pravastatin  (PRAVACHOL ) 20 MG tablet Take 20 mg by mouth at bedtime.     tamsulosin  (FLOMAX ) 0.4 MG CAPS capsule Take 0.4  mg by mouth daily.  3   No current facility-administered medications for this visit.    Past Medical History:  Diagnosis Date   AAA (abdominal aortic aneurysm) without rupture 04/05/2017   Anemia    Anxiety    a.) takes BZO (alprazolam ) as needed   Arthritis    Atherosclerosis of native arteries of extremity with intermittent claudication 08/10/2021   4 in legs   Bilateral cataracts     BPH (benign prostatic hyperplasia)    Carotid arterial disease    a.) s/p RICA stent placement   Cholelithiasis    CKD (chronic kidney disease), stage III (HCC)    right kidney doesn't work   Colon polyps    COPD (chronic obstructive pulmonary disease) (HCC)    Coronary artery disease    Erectile dysfunction    GERD (gastroesophageal reflux disease)    History of hiatal hernia    Hyperlipidemia    Hypertension    Hypokalemia 05/08/2023   Long-term use of aspirin  therapy    Nephrolithiasis    Olecranon bursitis, right elbow 05/25/2020   On long term clopidogrel  therapy    Penetrating atherosclerotic ulcer of aorta 03/22/2023   Peripheral vascular disease 04/05/2017   Psoriasis    Sigmoid diverticulosis    Type 2 MI (myocardial infarction) in setting of anemia 10/25/2023   Upper gastrointestinal hemorrhage due to angiodysplasia 05/10/2023   Wears dentures    full upper and lower    Past Surgical History:  Procedure Laterality Date   AMPUTATION TOE Right 09/16/2021   Procedure: AMPUTATION TOE;  Surgeon: Lennie Barter, DPM;  Location: [facility];  Service: Podiatry;  Laterality: Right;   BACK SURGERY     CAROTID STENT Right    CATARACT EXTRACTION W/PHACO Left 06/17/2019   Procedure: CATARACT EXTRACTION PHACO AND INTRAOCULAR LENS PLACEMENT (IOC) LEFT;  Surgeon: Myrna Adine Anes, MD;  Location: [facility];  Service: Ophthalmology;  Laterality: Left;   CATARACT EXTRACTION W/PHACO Right 07/08/2019   Procedure: CATARACT EXTRACTION PHACO AND INTRAOCULAR LENS PLACEMENT (IOC) right  00:39.2  10.8%  5.02  ;  Surgeon: Myrna Adine Anes, MD;  Location: [facility];  Service: Ophthalmology;  Laterality: Right;   COLONOSCOPY WITH PROPOFOL  N/A 04/13/2023   Procedure: COLONOSCOPY WITH PROPOFOL ;  Surgeon: Onita Elspeth Sharper, DO;  Location: [facility] ENDOSCOPY;  Service: Gastroenterology;  Laterality: N/A;   ENTEROSCOPY N/A 05/10/2023   Procedure: ENTEROSCOPY;  Surgeon:  Jinny Carmine, MD;  Location: [facility] ENDOSCOPY;  Service: Endoscopy;  Laterality: N/A;   ENTEROSCOPY N/A 10/27/2023   Procedure: ENTEROSCOPY;  Surgeon: Unk Corinn Skiff, MD;  Location: [facility] ENDOSCOPY;  Service: Gastroenterology;  Laterality: N/A;   ESOPHAGOGASTRODUODENOSCOPY (EGD) WITH PROPOFOL  N/A 04/13/2023   Procedure: ESOPHAGOGASTRODUODENOSCOPY (EGD) WITH PROPOFOL ;  Surgeon: Onita Elspeth Sharper, DO;  Location: [facility] ENDOSCOPY;  Service: Gastroenterology;  Laterality: N/A;   EYE SURGERY     GIVENS CAPSULE STUDY N/A 05/09/2023   Procedure: GIVENS CAPSULE STUDY;  Surgeon: Jinny Carmine, MD;  Location: [facility] ENDOSCOPY;  Service: Endoscopy;  Laterality: N/A;   GIVENS CAPSULE STUDY  10/27/2023   Procedure: GIVENS CAPSULE STUDY;  Surgeon: Unk Corinn Skiff, MD;  Location: [facility] ENDOSCOPY;  Service: Gastroenterology;;   HOT HEMOSTASIS  05/10/2023   Procedure: HOT HEMOSTASIS (ARGON PLASMA COAGULATION/BICAP);  Surgeon: Jinny Carmine, MD;  Location: [facility] ENDOSCOPY;  Service: Endoscopy;;   LEG SURGERY Bilateral    stent placement   LOWER EXTREMITY ANGIOGRAPHY Right 08/19/2021   Procedure: LOWER EXTREMITY ANGIOGRAPHY;  Surgeon: Marea,  Selinda RAMAN, MD;  Location: [facility] INVAS[facility] CV LAB;  Service: Cardiovascular;  Laterality: Right;   LOWER EXTREMITY ANGIOGRAPHY Right 08/09/2023   Procedure: Lower Extremity Angiography;  Surgeon: Marea Selinda RAMAN, MD;  Location: [facility];  Service: Cardiovascular;  Laterality: Right;   LOWER EXTREMITY ANGIOGRAPHY Right 08/10/2023   Procedure: Lower Extremity Angiography;  Surgeon: Marea Selinda RAMAN, MD;  Location: [facility] IN[facility] CV LAB;  Service: Cardiovascular;  Laterality: Right;   OLECRANON BURSECTOMY Right 09/17/2024   Procedure: JENELLE SPAIN;  Surgeon: Ezra Jackquline RAMAN, MD;  Location: [facility] ORS;  Service: Orthopedics;  Laterality: Right;   PERIPHERAL VASCULAR BALLOON ANGIOPLASTY Right 09/21/2017   Procedure: PERIPHERAL VASCULAR BALLOON ANGIOPLASTY;  Surgeon: Marea Selinda RAMAN, MD;   Location: [facility];  Service: Cardiovascular;  Laterality: Right;   REVERSE SHOULDER ARTHROPLASTY Right 11/26/2019   Procedure: REVERSE SHOULDER ARTHROPLASTY;  Surgeon: Edie Norleen PARAS, MD;  Location: [facility] ORS;  Service: Orthopedics;  Laterality: Right;   SUBMUCOSAL TATTOO INJECTION  10/27/2023   Procedure: SUBMUCOSAL TATTOO INJECTION;  Surgeon: Unk Corinn Skiff, MD;  Location: [facility] ENDOSCOPY;  Service: Gastroenterology;;     Social History[1]    Family History  Problem Relation Age of Onset   Bladder Cancer Mother    Prostate cancer Father    Prostate cancer Brother    Diabetes Maternal Grandfather      Allergies[2]    REVIEW OF SYSTEMS (Negative unless checked)   Constitutional: [] Weight loss  [] Fever  [] Chills Cardiac: [] Chest pain   [] Chest pressure   [] Palpitations   [] Shortness of breath when laying flat   [] Shortness of breath at rest   [x] Shortness of breath with exertion. Vascular:  [x] Pain in legs with walking   [] Pain in legs at rest   [] Pain in legs when laying flat   [x] Claudication   [] Pain in feet when walking  [] Pain in feet at rest  [] Pain in feet when laying flat   [] History of DVT   [] Phlebitis   [] Swelling in legs   [] Varicose veins   [x] Non-healing ulcers Pulmonary:   [] Uses home oxygen   [] Productive cough   [] Hemoptysis   [] Wheeze  [] COPD   [] Asthma Neurologic:  [] Dizziness  [] Blackouts   [] Seizures   [] History of stroke   [] History of TIA  [] Aphasia   [] Temporary blindness   [] Dysphagia   [] Weakness or numbness in arms   [] Weakness or numbness in legs Musculoskeletal:  [x] Arthritis   [] Joint swelling   [x] Joint pain   [] Low back pain Hematologic:  [] Easy bruising  [] Easy bleeding   [] Hypercoagulable state   [] Anemic   Gastrointestinal:  [] Blood in stool   [] Vomiting blood  [] Gastroesophageal reflux/heartburn   [] Abdominal pain Genitourinary:  [x] Chronic kidney disease   [] Difficult urination  [] Frequent urination  [] Burning with urination    [] Hematuria Skin:  [] Rashes   [x] Ulcers   [x] Wounds Psychological:  [] History of anxiety   []  History of major depression.  Physical Examination  BP (!) 169/71 (BP Location: Right Arm)   Pulse 71   Resp 18   Wt 138 lb 6.4 oz (62.8 kg)   BMI 22.34 kg/m  Gen:  WD/WN, NAD.  Appears younger than stated age Head: [place]/AT, No temporalis wasting. Ear/Nose/Throat: Hearing grossly intact, nares w/o erythema or drainage Eyes: Conjunctiva clear. Sclera non-icteric Neck: Supple.  Trachea midline Pulmonary:  Good air movement, no use of accessory muscles.  Cardiac: Somewhat irregular Vascular:  Vessel Right Left  Radial Palpable Palpable  PT Not palpable 1+ palpable  DP Trace palpable 1+ palpable   Gastrointestinal: soft, non-tender/non-distended. No guarding/reflex.  Musculoskeletal: M/S 5/5 throughout.  No deformity or atrophy.  No edema. Neurologic: Sensation grossly intact in extremities.  Symmetrical.  Speech is fluent.  Psychiatric: Judgment intact, Mood & affect appropriate for pt's clinical situation. Dermatologic: No rashes or ulcers noted.  No cellulitis or open wounds.  Physical Exam     Labs Recent Results (from the past 2160 hours)  Surgical pathology     Status: None   Collection Time: 09/17/24 12:00 AM  Result Value Ref Range   SURGICAL PATHOLOGY      SURGICAL PATHOLOGY [facility] [address], [address] [place], [place] [address] Telephone [phone] or [phone] Fax [phone]  REPORT OF SURGICAL PATHOLOGY   Accession #: [id number] Patient Name: MIRO, BALDERSON Visit # : [id number]  MRN: [id number] Physician: Ezra Jackquline RAMAN. DOB/Age 30-Oct-1936 (Age: 62) Gender: M Collected Date: 09/17/2024 Received Date: 09/17/2024  FINAL DIAGNOSIS       1. Bursa, excision, olecranon bursa :       SYNOVIUM WITH PALISADED GRANULOMAS WITH CENTRAL CRYSTALLINE LIKE REFRACTILE      MATERIAL.      SEE  NOTE.       Diagnosis Note : The findings are most consistent with gout and rheumatoid      nodules are considered less likely.      DATE SIGNED OUT: 09/18/2024 ELECTRONIC SIGNATURE : Belvie Come, [place], Pathologist, Electronic Signature  MICROSCOPIC DESCRIPTION  CASE COMMENTS STAINS USED IN DIAGNOSIS: H&E H&E    CLINICAL HISTORY  SPECIMEN(S) OBTAINED 1. Bursa, excision, Olecr anon Bursa  SPECIMEN COMMENTS: SPECIMEN CLINICAL INFORMATION: 1. Olecranon bursitis of right elbow    Gross Description 1. Received fresh and placed in formalin is a 4.6 x 2.8 x 0.9 cm disrupted tan-pink tissue with chalky, white discoloration. Representative section submitted in 1A, tissue submitted in 100% alcohol in 1B for non-aqeuous processing. mb 09-17-24        Report signed out from the following location(s) [place] [address] [place], [place] [address] CLIA #: [id number]  [facility] [address] [place], [place] [address] CLIA #: [id number]   Ferritin     Status: None   Collection Time: 09/23/24  9:45 AM  Result Value Ref Range   Ferritin 240 24 - 336 ng/mL    Comment: Performed at [facility], [address] [place], [place] [address]  Iron  and TIBC     Status: None   Collection Time: 09/23/24  9:45 AM  Result Value Ref Range   Iron  57 45 - 182 ug/dL   TIBC 718 749 - 549 ug/dL   Saturation Ratios 20 17.9 - 39.5 %   UIBC 224 ug/dL    Comment: Performed at [facility], [address] [place], [place] [address]  CBC with Differential (Cancer Center Only)     Status: Abnormal   Collection Time: 09/23/24  9:45 AM  Result Value Ref Range   WBC Count 8.0 4.0 - 10.5 K/uL   RBC 5.32 4.22 - 5.81 MIL/uL   Hemoglobin 15.7 13.0 - 17.0 g/dL   HCT 53.1 60.9 - 47.9 %   MCV 88.0 80.0 - 100.0 fL   MCH 29.5 26.0 - 34.0 pg   MCHC 33.5 30.0 - 36.0 g/dL   RDW 87.7 88.4 - 84.4 %   Platelet Count 377 150 -  400 K/uL    nRBC 0.0 0.0 - 0.2 %   Neutrophils Relative % 76 %   Neutro Abs 6.1 1.7 - 7.7 K/uL   Lymphocytes Relative 9 %   Lymphs Abs 0.7 0.7 - 4.0 K/uL   Monocytes Relative 9 %   Monocytes Absolute 0.7 0.1 - 1.0 K/uL   Eosinophils Relative 4 %   Eosinophils Absolute 0.4 0.0 - 0.5 K/uL   Basophils Relative 1 %   Basophils Absolute 0.1 0.0 - 0.1 K/uL   Immature Granulocytes 1 %   Abs Immature Granulocytes 0.10 (H) 0.00 - 0.07 K/uL    Comment: Performed at [facility], [address] [place], [place] [address]  CMP (Cancer Center only)     Status: Abnormal   Collection Time: 09/23/24  9:46 AM  Result Value Ref Range   Sodium 135 135 - 145 mmol/L   Potassium 3.5 3.5 - 5.1 mmol/L   Chloride 95 (L) 98 - 111 mmol/L   CO2 26 22 - 32 mmol/L   Glucose, Bld 138 (H) 70 - 99 mg/dL    Comment: Glucose reference range applies only to samples taken after fasting for at least 8 hours.   BUN 38 (H) 8 - 23 mg/dL   Creatinine 8.27 (H) 9.38 - 1.24 mg/dL   Calcium  9.3 8.9 - 10.3 mg/dL   Total Protein 7.6 6.5 - 8.1 g/dL   Albumin 3.9 3.5 - 5.0 g/dL   AST 30 15 - 41 U/L   ALT 13 0 - 44 U/L   Alkaline Phosphatase 75 38 - 126 U/L   Total Bilirubin 0.6 0.0 - 1.2 mg/dL   GFR, Estimated 38 (L) >60 mL/min    Comment: (NOTE) Calculated using the CKD-EPI Creatinine Equation (2021)    Anion gap 14 5 - 15    Comment: Performed at [facility], [address] [place], [place] [address]    Radiology No results found.  Assessment/Plan  No problem-specific Assessment & Plan notes found for this encounter.  Assessment & Plan Atherosclerosis of native arteries of right lower extremity with intermittent claudication Chronic atherosclerotic disease with significantly reduced perfusion (ABI 0.56). Right foot pain due to neuropathy and arterial insufficiency. Symptoms tolerable with conservative management. Prior interventions provided partial improvement. Further intervention reserved for intolerable  pain or non-healing wounds. - Continued conservative management with topical Valera and supportive care for right foot pain. - Monitor for intolerable pain or non-healing wounds; consider repeat angiography and possible intervention if present. - Scheduled three-month follow-up to reassess lower extremity status and perfusion.  Atherosclerosis of native arteries of left lower extremity with intermittent claudication Chronic atherosclerotic disease with better perfusion than right (ABI 0.8). A little bit of numbness in left foot. - Scheduled three-month follow-up to reassess lower extremity status and perfusion.  Infrarenal abdominal aortic aneurysm without rupture Infrarenal abdominal aortic aneurysm measuring 4.1-4.2 cm. No rapid expansion or rupture. Measurement variability due to imaging angle. No intervention indicated. - Scheduled six-month imaging follow-up for aneurysm surveillance.  Bilateral carotid artery stenosis Bilateral carotid artery stenosis with normal right carotid velocities and mildly elevated left carotid velocities (40-59% stenosis). Annual stroke risk <1%. No intervention indicated. - Scheduled annual carotid ultrasound surveillance.  Hyperlipidemia lipid control important in reducing the progression of atherosclerotic disease. Continue statin therapy     Essential hypertension, benign blood pressure control important in reducing the progression of atherosclerotic disease. On appropriate oral medications.   Selinda Gu, MD  11/05/2024 10:46 AM  This note was created with Dragon medical transcription system.  Any errors from dictation are purely unintentional     [1]  Social History Tobacco Use   Smoking status: Former    Current packs/day: 0.00    Types: Cigarettes    Quit date: 2001    Years since quitting: 25.0   Smokeless tobacco: Never  Vaping Use   Vaping status: Never Used  Substance Use Topics   Alcohol use: Not Currently   Drug use: No   [2]  Allergies Allergen Reactions   Codeine Itching   "

## 2024-11-06 LAB — VAS US ABI WITH/WO TBI
Left ABI: 0.87
Right ABI: 0.56

## 2024-11-14 NOTE — Progress Notes (Signed)
 Subjective:     Patient ID: Bryan Welch is a 89 y.o. male.  Toe Pain   : PRESENTS WITH AN OPEN WOUND BETWEEN RIGHT 4TH AND 5TH TOE. FIRST NOTED 3 DAYS AGO. MOSTLY PAINFUL AT NIGHT. NO FEVER. HX OF POOR CIRCULATION OF RIGHT FOOT. HAS HAD 2ND TOE AMPUTATED. RECORDS REVIEWED.   Past Medical History:  has a past medical history of AAA (abdominal aortic aneurysm) (), Cataracts, bilateral, Chickenpox, Confusion, unspecified (04/07/2014), COPD (chronic obstructive pulmonary disease) (CMS/HHS-HCC), Dog bite of arm (02/25/14), Elevated PSA, less than 10 ng/ml, Erectile dysfunction, Facial numbness (04/07/2014), GERD (gastroesophageal reflux disease), History of colon polyps, History of kidney stones, Hyperlipidemia, Hypertension, Kidney stones, Peripheral vascular disease (), and Psoriasis. Past Surgical History:  has a past surgical history that includes Back surgery; Bilateral Leg Stents; Coronary angioplasty (2014); Tonsillectomy; Colonoscopy (08/04/93; 11/23/99; 06/20/06); reverse right total shoulder arthroplasty (Right, 11/26/2019); Fracture surgery; Removal External Fixator Ankle Foot/Toe (2020); Colon @ [facility] (04/13/2023); EGD @ [facility] (04/13/2023); and Olecranon Bursectomy (Right, 09/17/2024). Family History: family history includes Diabetes type II in his mother; Prostate cancer in his brother and father; Transient ischemic attack in his brother. Social History:  reports that he quit smoking about 26 years ago. His smoking use included cigarettes. He has been exposed to tobacco smoke. He has never used smokeless tobacco. He reports that he does not currently use alcohol. He reports that he does not use drugs. Prior to encounter Medications:  Current Outpatient Medications on File Prior to Visit  Medication Sig Dispense Refill   ALPRAZolam  (XANAX ) 0.25 MG tablet TAKE 1 TABLET (0.25 MG TOTAL) BY MOUTH AT BEDTIME AS NEEDED FOR SLEEP 30 tablet 3   aspirin  81 MG EC tablet Take 1 tablet (81 mg total) by  mouth once daily     cholecalciferol  (VITAMIN D3) 1,000 unit capsule Take 2,000 Units by mouth once daily.     clopidogreL  (PLAVIX ) 75 mg tablet Take 75 mg by mouth once daily     clopidogreL  (PLAVIX ) 75 mg tablet Take 75 mg by mouth once daily     cyanocobalamin  (VITAMIN B12) 1000 MCG tablet Take 1,000 mcg by mouth once daily     diphenhydrAMINE  (BENADRYL ) 25 mg capsule Take by mouth as needed     gabapentin  (NEURONTIN ) 300 MG capsule TAKE 1 CAPSULE (300 MG TOTAL) BY MOUTH 2 (TWO) TIMES DAILY FOR 90 DAYS 60 capsule 2   hydroCHLOROthiazide  (HYDRODIURIL ) 25 MG tablet TAKE 1 TABLET (25 MG TOTAL) BY MOUTH DAILY. 90 tablet 1   ondansetron  (ZOFRAN -ODT) 4 MG disintegrating tablet Take 1 tablet (4 mg total) by mouth every 8 (eight) hours as needed for Nausea (colonoscopy prep) 5 tablet 0   pantoprazole  (PROTONIX ) 40 MG DR tablet Take 1 tablet (40 mg total) by mouth once daily 90 tablet 3   potassium chloride  (KLOR-CON ) 10 MEQ ER tablet TAKE 1 TABLET BY MOUTH EVERY DAY 90 tablet 1   pravastatin  (PRAVACHOL ) 20 MG tablet TAKE 1 TABLET BY MOUTH EVERY DAY AT NIGHT 90 tablet 1   tamsulosin  (FLOMAX ) 0.4 mg capsule TAKE 1 CAPSULE (0.4 MG TOTAL) BY MOUTH ONCE DAILY TAKE 30 MINUTES AFTER SAME MEAL EACH DAY. 90 capsule 1   HYDROcodone -acetaminophen  (NORCO) 5-325 mg tablet Take one tablet at night for pain; may take up to every 6 hours as needed for pain if not working or driving (Patient not taking: Reported on 11/14/2024) 20 tablet 0   oxyCODONE  (ROXICODONE ) 5 MG immediate release tablet  (Patient not  taking: Reported on 11/14/2024)     predniSONE  (DELTASONE ) 10 MG tablet May take 1 tablet twice daily with food for 5 days, then 1 tablet once daily with food for 5 days. (Patient not taking: Reported on 11/14/2024) 15 tablet 0   No current facility-administered medications on file prior to visit.   Allergies: is allergic to codeine.  This an established patient is here today for: office visit.  Review  of Systems     Objective:   Physical Exam Vitals and nursing note reviewed.  Constitutional:      General: He is not in acute distress.    Appearance: Normal appearance. He is not ill-appearing or toxic-appearing.  HENT:     Head: Normocephalic and atraumatic.  Cardiovascular:     Rate and Rhythm: Normal rate.  Pulmonary:     Effort: Pulmonary effort is normal.  Musculoskeletal:     Comments: EVAL OF RIGHT FOOT. SKIN IS RED WITH SLOW CAPILLAR REFILL. PINKS BACK UP WITH ELEVATION.  2ND TOE ABSENT SMALL OPEN AREA NOTED MEDIAL SIDE OF 3RD TOE AS SWELL AND BETWEEN 4TH AND 5TH TOES. NO SWELLING. NO INCREASED WARMTH. NO DRAINAGE.    Neurological:     Mental Status: He is alert.        Assessment:     1. Open wound of third toe of right foot, initial encounter   -     doxycycline (VIBRA-TABS) 100 MG tablet; Take 1 tablet (100 mg total) by mouth 2 (two) times daily for 10 days -     mupirocin (BACTROBAN) 2 % ointment; Apply topically 3 (three) times daily for 7 days  2. Open wound of fourth toe of right foot, initial encounter   -     doxycycline (VIBRA-TABS) 100 MG tablet; Take 1 tablet (100 mg total) by mouth 2 (two) times daily for 10 days -     mupirocin (BACTROBAN) 2 % ointment; Apply topically 3 (three) times daily for 7 days     Plan:     Patient Instructions  CALL DR. BAKER FOR FOLLOW UP APPT ORAL ANTIBIOTIC TWICE DAILY  SOAK FOOT TWICE DAILY IN WARM WATER FOR 15 MIN LET DRY . APPLY OINTMENT OT WOUNDS AND COVER.

## 2024-11-19 ENCOUNTER — Inpatient Hospital Stay
Admission: EM | Admit: 2024-11-19 | Discharge: 2024-11-23 | DRG: 271 | Disposition: A | Discharge status: Home / Self Care | Attending: Internal Medicine | Admitting: Internal Medicine

## 2024-11-19 ENCOUNTER — Emergency Department

## 2024-11-19 ENCOUNTER — Other Ambulatory Visit: Payer: Self-pay

## 2024-11-19 DIAGNOSIS — L97519 Non-pressure chronic ulcer of other part of right foot with unspecified severity: Secondary | ICD-10-CM | POA: Diagnosis not present

## 2024-11-19 DIAGNOSIS — Z860100 Personal history of colon polyps, unspecified: Secondary | ICD-10-CM

## 2024-11-19 DIAGNOSIS — I70221 Atherosclerosis of native arteries of extremities with rest pain, right leg: Principal | ICD-10-CM | POA: Diagnosis present

## 2024-11-19 DIAGNOSIS — I2510 Atherosclerotic heart disease of native coronary artery without angina pectoris: Secondary | ICD-10-CM | POA: Diagnosis present

## 2024-11-19 DIAGNOSIS — Z961 Presence of intraocular lens: Secondary | ICD-10-CM | POA: Diagnosis present

## 2024-11-19 DIAGNOSIS — I998 Other disorder of circulatory system: Secondary | ICD-10-CM | POA: Diagnosis present

## 2024-11-19 DIAGNOSIS — Z9841 Cataract extraction status, right eye: Secondary | ICD-10-CM

## 2024-11-19 DIAGNOSIS — Z9842 Cataract extraction status, left eye: Secondary | ICD-10-CM

## 2024-11-19 DIAGNOSIS — Z79899 Other long term (current) drug therapy: Secondary | ICD-10-CM

## 2024-11-19 DIAGNOSIS — N400 Enlarged prostate without lower urinary tract symptoms: Secondary | ICD-10-CM | POA: Diagnosis present

## 2024-11-19 DIAGNOSIS — I714 Abdominal aortic aneurysm, without rupture, unspecified: Secondary | ICD-10-CM | POA: Diagnosis not present

## 2024-11-19 DIAGNOSIS — Z885 Allergy status to narcotic agent status: Secondary | ICD-10-CM | POA: Diagnosis not present

## 2024-11-19 DIAGNOSIS — Z87891 Personal history of nicotine dependence: Secondary | ICD-10-CM | POA: Diagnosis not present

## 2024-11-19 DIAGNOSIS — Z89432 Acquired absence of left foot: Secondary | ICD-10-CM | POA: Diagnosis not present

## 2024-11-19 DIAGNOSIS — Z7902 Long term (current) use of antithrombotics/antiplatelets: Secondary | ICD-10-CM | POA: Diagnosis not present

## 2024-11-19 DIAGNOSIS — I252 Old myocardial infarction: Secondary | ICD-10-CM

## 2024-11-19 DIAGNOSIS — Z7982 Long term (current) use of aspirin: Secondary | ICD-10-CM

## 2024-11-19 DIAGNOSIS — I129 Hypertensive chronic kidney disease with stage 1 through stage 4 chronic kidney disease, or unspecified chronic kidney disease: Secondary | ICD-10-CM | POA: Diagnosis present

## 2024-11-19 DIAGNOSIS — I7143 Infrarenal abdominal aortic aneurysm, without rupture: Secondary | ICD-10-CM | POA: Diagnosis present

## 2024-11-19 DIAGNOSIS — L97511 Non-pressure chronic ulcer of other part of right foot limited to breakdown of skin: Secondary | ICD-10-CM | POA: Diagnosis present

## 2024-11-19 DIAGNOSIS — T82868A Thrombosis of vascular prosthetic devices, implants and grafts, initial encounter: Secondary | ICD-10-CM | POA: Diagnosis present

## 2024-11-19 DIAGNOSIS — Z89421 Acquired absence of other right toe(s): Secondary | ICD-10-CM

## 2024-11-19 DIAGNOSIS — J449 Chronic obstructive pulmonary disease, unspecified: Secondary | ICD-10-CM | POA: Diagnosis present

## 2024-11-19 DIAGNOSIS — I70235 Atherosclerosis of native arteries of right leg with ulceration of other part of foot: Secondary | ICD-10-CM | POA: Diagnosis not present

## 2024-11-19 DIAGNOSIS — E785 Hyperlipidemia, unspecified: Secondary | ICD-10-CM | POA: Diagnosis present

## 2024-11-19 DIAGNOSIS — Z87442 Personal history of urinary calculi: Secondary | ICD-10-CM

## 2024-11-19 DIAGNOSIS — Z96611 Presence of right artificial shoulder joint: Secondary | ICD-10-CM | POA: Diagnosis present

## 2024-11-19 DIAGNOSIS — I743 Embolism and thrombosis of arteries of the lower extremities: Secondary | ICD-10-CM | POA: Diagnosis present

## 2024-11-19 DIAGNOSIS — N1832 Chronic kidney disease, stage 3b: Secondary | ICD-10-CM | POA: Diagnosis present

## 2024-11-19 DIAGNOSIS — G629 Polyneuropathy, unspecified: Secondary | ICD-10-CM | POA: Diagnosis present

## 2024-11-19 DIAGNOSIS — I10 Essential (primary) hypertension: Secondary | ICD-10-CM | POA: Diagnosis not present

## 2024-11-19 DIAGNOSIS — K219 Gastro-esophageal reflux disease without esophagitis: Secondary | ICD-10-CM | POA: Diagnosis present

## 2024-11-19 DIAGNOSIS — G8929 Other chronic pain: Secondary | ICD-10-CM | POA: Diagnosis present

## 2024-11-19 DIAGNOSIS — Z95828 Presence of other vascular implants and grafts: Secondary | ICD-10-CM | POA: Diagnosis not present

## 2024-11-19 DIAGNOSIS — Z9889 Other specified postprocedural states: Secondary | ICD-10-CM | POA: Diagnosis not present

## 2024-11-19 DIAGNOSIS — Z833 Family history of diabetes mellitus: Secondary | ICD-10-CM

## 2024-11-19 LAB — CBC
HCT: 44.1 % (ref 39.0–52.0)
Hemoglobin: 14.7 g/dL (ref 13.0–17.0)
MCH: 29.4 pg (ref 26.0–34.0)
MCHC: 33.3 g/dL (ref 30.0–36.0)
MCV: 88.2 fL (ref 80.0–100.0)
Platelets: 213 K/uL (ref 150–400)
RBC: 5 MIL/uL (ref 4.22–5.81)
RDW: 13.5 % (ref 11.5–15.5)
WBC: 8.9 K/uL (ref 4.0–10.5)
nRBC: 0 % (ref 0.0–0.2)

## 2024-11-19 LAB — COMPREHENSIVE METABOLIC PANEL WITH GFR
ALT: 6 U/L (ref 0–44)
AST: 21 U/L (ref 15–41)
Albumin: 4.3 g/dL (ref 3.5–5.0)
Alkaline Phosphatase: 91 U/L (ref 38–126)
Anion gap: 15 (ref 5–15)
BUN: 31 mg/dL — ABNORMAL HIGH (ref 8–23)
CO2: 21 mmol/L — ABNORMAL LOW (ref 22–32)
Calcium: 8.6 mg/dL — ABNORMAL LOW (ref 8.9–10.3)
Chloride: 105 mmol/L (ref 98–111)
Creatinine, Ser: 1.64 mg/dL — ABNORMAL HIGH (ref 0.61–1.24)
GFR, Estimated: 40 mL/min — ABNORMAL LOW
Glucose, Bld: 140 mg/dL — ABNORMAL HIGH (ref 70–99)
Potassium: 3.7 mmol/L (ref 3.5–5.1)
Sodium: 141 mmol/L (ref 135–145)
Total Bilirubin: 0.4 mg/dL (ref 0.0–1.2)
Total Protein: 7.5 g/dL (ref 6.5–8.1)

## 2024-11-19 LAB — APTT: aPTT: >200 s (ref 24–36)

## 2024-11-19 LAB — PROTIME-INR
INR: 1.2 (ref 0.8–1.2)
Prothrombin Time: 15.4 s — ABNORMAL HIGH (ref 11.4–15.2)

## 2024-11-19 MED ORDER — VANCOMYCIN HCL 1250 MG/250ML IV SOLN
1250.0000 mg | Freq: Once | INTRAVENOUS | Status: AC
  Administered 2024-11-19: 1250 mg via INTRAVENOUS
  Filled 2024-11-19 (×2): qty 250

## 2024-11-19 MED ORDER — CLOPIDOGREL BISULFATE 75 MG PO TABS
75.0000 mg | ORAL_TABLET | Freq: Every day | ORAL | Status: DC
  Administered 2024-11-20 – 2024-11-23 (×4): 75 mg via ORAL
  Filled 2024-11-19 (×4): qty 1

## 2024-11-19 MED ORDER — HYDROCHLOROTHIAZIDE 25 MG PO TABS: 25.0000 mg | ORAL_TABLET | Freq: Every day | ORAL | Status: DC

## 2024-11-19 MED ORDER — HEPARIN (PORCINE) 25000 UT/250ML-% IV SOLN
950.0000 [IU]/h | INTRAVENOUS | Status: DC
  Administered 2024-11-19 – 2024-11-22 (×3): 950 [IU]/h via INTRAVENOUS
  Administered 2024-11-22: 1050 [IU]/h via INTRAVENOUS
  Filled 2024-11-19 (×3): qty 250

## 2024-11-19 MED ORDER — PIPERACILLIN-TAZOBACTAM 3.375 G IVPB 30 MIN
3.3750 g | Freq: Once | INTRAVENOUS | Status: AC
  Administered 2024-11-19: 3.375 g via INTRAVENOUS
  Filled 2024-11-19: qty 50

## 2024-11-19 MED ORDER — ACETAMINOPHEN 325 MG PO TABS
650.0000 mg | ORAL_TABLET | Freq: Four times a day (QID) | ORAL | Status: DC | PRN
  Administered 2024-11-20: 650 mg via ORAL
  Filled 2024-11-19: qty 2

## 2024-11-19 MED ORDER — GABAPENTIN 300 MG PO CAPS
300.0000 mg | ORAL_CAPSULE | Freq: Two times a day (BID) | ORAL | Status: DC
  Administered 2024-11-19 – 2024-11-23 (×8): 300 mg via ORAL
  Filled 2024-11-19 (×8): qty 1

## 2024-11-19 MED ORDER — HEPARIN BOLUS VIA INFUSION
3700.0000 [IU] | Freq: Once | INTRAVENOUS | Status: AC
  Administered 2024-11-19: 3700 [IU] via INTRAVENOUS
  Filled 2024-11-19: qty 3700

## 2024-11-19 MED ORDER — SENNOSIDES-DOCUSATE SODIUM 8.6-50 MG PO TABS: 1.0000 | ORAL_TABLET | Freq: Every evening | ORAL | Status: DC | PRN

## 2024-11-19 MED ORDER — ASPIRIN 81 MG PO TBEC
81.0000 mg | DELAYED_RELEASE_TABLET | Freq: Every day | ORAL | Status: DC
  Administered 2024-11-20 – 2024-11-23 (×4): 81 mg via ORAL
  Filled 2024-11-19 (×4): qty 1

## 2024-11-19 MED ORDER — AMLODIPINE BESYLATE 10 MG PO TABS
10.0000 mg | ORAL_TABLET | Freq: Every day | ORAL | Status: DC
  Administered 2024-11-19 – 2024-11-23 (×5): 10 mg via ORAL
  Filled 2024-11-19 (×5): qty 1

## 2024-11-19 MED ORDER — OXYCODONE HCL 5 MG PO TABS
2.5000 mg | ORAL_TABLET | Freq: Once | ORAL | Status: AC
  Administered 2024-11-19: 2.5 mg via ORAL
  Filled 2024-11-19: qty 1

## 2024-11-19 MED ORDER — ONDANSETRON HCL 4 MG PO TABS: 4.0000 mg | ORAL_TABLET | Freq: Four times a day (QID) | ORAL | Status: DC | PRN

## 2024-11-19 MED ORDER — TAMSULOSIN HCL 0.4 MG PO CAPS
0.4000 mg | ORAL_CAPSULE | Freq: Every day | ORAL | Status: DC
  Administered 2024-11-19 – 2024-11-23 (×5): 0.4 mg via ORAL
  Filled 2024-11-19 (×5): qty 1

## 2024-11-19 MED ORDER — PRAVASTATIN SODIUM 20 MG PO TABS
20.0000 mg | ORAL_TABLET | Freq: Every day | ORAL | Status: DC
  Administered 2024-11-19 – 2024-11-20 (×2): 20 mg via ORAL
  Filled 2024-11-19 (×2): qty 1

## 2024-11-19 MED ORDER — ORAL CARE MOUTH RINSE: 15.0000 mL | OROMUCOSAL | Status: DC | PRN

## 2024-11-19 MED ORDER — HYDROMORPHONE HCL 2 MG PO TABS
1.0000 mg | ORAL_TABLET | ORAL | Status: DC | PRN
  Administered 2024-11-20 – 2024-11-22 (×3): 1 mg via ORAL
  Filled 2024-11-19 (×3): qty 1

## 2024-11-19 MED ORDER — ONDANSETRON HCL 4 MG/2ML IJ SOLN
4.0000 mg | Freq: Four times a day (QID) | INTRAMUSCULAR | Status: DC | PRN
  Administered 2024-11-21: 4 mg via INTRAVENOUS
  Filled 2024-11-19: qty 2

## 2024-11-19 MED ORDER — ALPRAZOLAM 0.25 MG PO TABS
0.2500 mg | ORAL_TABLET | Freq: Every day | ORAL | Status: DC
  Administered 2024-11-19 – 2024-11-22 (×4): 0.25 mg via ORAL
  Filled 2024-11-19 (×4): qty 1

## 2024-11-19 MED ORDER — PANTOPRAZOLE SODIUM 40 MG PO TBEC
40.0000 mg | DELAYED_RELEASE_TABLET | Freq: Every day | ORAL | Status: DC
  Administered 2024-11-20 – 2024-11-23 (×4): 40 mg via ORAL
  Filled 2024-11-19 (×4): qty 1

## 2024-11-19 MED ORDER — ACETAMINOPHEN 650 MG RE SUPP: 650.0000 mg | Freq: Four times a day (QID) | RECTAL | Status: DC | PRN

## 2024-11-19 MED ORDER — CEPHALEXIN 500 MG PO CAPS
500.0000 mg | ORAL_CAPSULE | Freq: Once | ORAL | Status: AC
  Administered 2024-11-19: 500 mg via ORAL
  Filled 2024-11-19: qty 1

## 2024-11-19 MED ORDER — TRAZODONE HCL 50 MG PO TABS: 25.0000 mg | ORAL_TABLET | Freq: Every evening | ORAL | Status: DC | PRN

## 2024-11-19 MED ORDER — HYDRALAZINE HCL 20 MG/ML IJ SOLN: 5.0000 mg | Freq: Four times a day (QID) | INTRAMUSCULAR | Status: DC | PRN

## 2024-11-19 NOTE — H&P (Signed)
 " History and Physical    Bryan Welch FMW:[id number] DOB: Nov 22, 1935 DOA: 11/19/2024  PCP: Rudolpho Norleen BIRCH, MD (Confirm with patient/family/NH records and if not entered, this has to be entered at [facility] point of entry) Patient coming from: Home  I have personally briefly reviewed patient's old medical records in [facility]  Chief Complaint: Right toe pain  HPI: Bryan Welch is a 89 y.o. male with medical history significant of PVD status post bilateral lower extremity angioplasty and stenting, and RLE revascularization in 2018, CKD stage IIIb, CAD/NSTEMI, chronic iron  deficiency anemia secondary to GI bleed, HTN, AAA, brought in by family member for evaluation of worsening of pain of right toe.  Symptoms started about 2 weeks ago when patient started to have resting pain of right 4th toe, along with claudication and gradual getting worse.  Worsening at night, he describes the pain as  pulsatile.  Eventually he went to a walk-in clinic on January 8, he was diagnosed with infected fourth toe ulcer and started take doxycycline.  He completed 4 days course of doxycycline but has not feeling better.  This morning, daughter drove her to the ED.  Denies any fever chills no chest pain or shortness of breath. ED Course: 98.4 temperature, nontachycardic blood pressure 170/60 O2 saturation 98% on room air.  X-ray of right foot did not show significant bony abnormalities.  Blood work showed WBC 8.9 hemoglobin 14.7 BUN 31 creatinine 1.6 compared to baseline 1.7 glucose 140 bicarb 21.  Patient was given 1 dose of Ancef  and IV Dilaudid  for pain.  ET found diminished pedal pulses and patient was started on heparin  drip and vascular surgery and podiatry were consulted.  Review of Systems: As per HPI otherwise 14 point review of systems negative.    Past Medical History:  Diagnosis Date   AAA (abdominal aortic aneurysm) without rupture 04/05/2017   Anemia    Anxiety    a.) takes BZO  (alprazolam ) as needed   Arthritis    Atherosclerosis of native arteries of extremity with intermittent claudication 08/10/2021   4 in legs   Bilateral cataracts    BPH (benign prostatic hyperplasia)    Carotid arterial disease    a.) s/p RICA stent placement   Cholelithiasis    CKD (chronic kidney disease), stage III (HCC)    right kidney doesn't work   Colon polyps    COPD (chronic obstructive pulmonary disease) (HCC)    Coronary artery disease    Erectile dysfunction    GERD (gastroesophageal reflux disease)    History of hiatal hernia    Hyperlipidemia    Hypertension    Hypokalemia 05/08/2023   Long-term use of aspirin  therapy    Nephrolithiasis    Olecranon bursitis, right elbow 05/25/2020   On long term clopidogrel  therapy    Penetrating atherosclerotic ulcer of aorta 03/22/2023   Peripheral vascular disease 04/05/2017   Psoriasis    Sigmoid diverticulosis    Type 2 MI (myocardial infarction) in setting of anemia 10/25/2023   Upper gastrointestinal hemorrhage due to angiodysplasia 05/10/2023   Wears dentures    full upper and lower    Past Surgical History:  Procedure Laterality Date   AMPUTATION TOE Right 09/16/2021   Procedure: AMPUTATION TOE;  Surgeon: Lennie Barter, DPM;  Location: [facility];  Service: Podiatry;  Laterality: Right;   BACK SURGERY     CAROTID STENT Right    CATARACT EXTRACTION W/PHACO Left 06/17/2019   Procedure: CATARACT EXTRACTION  PHACO AND INTRAOCULAR LENS PLACEMENT (IOC) LEFT;  Surgeon: Myrna Adine Anes, MD;  Location: [facility];  Service: Ophthalmology;  Laterality: Left;   CATARACT EXTRACTION W/PHACO Right 07/08/2019   Procedure: CATARACT EXTRACTION PHACO AND INTRAOCULAR LENS PLACEMENT (IOC) right  00:39.2  10.8%  5.02  ;  Surgeon: Myrna Adine Anes, MD;  Location: [facility]ERY [facility];  Service: Ophthalmology;  Laterality: Right;   COLONOSCOPY WITH PROPOFOL  N/A 04/13/2023   Procedure: COLONOSCOPY WITH PROPOFOL ;   Surgeon: Onita Elspeth Sharper, DO;  Location: [facility] ENDOSCOPY;  Service: Gastroenterology;  Laterality: N/A;   ENTEROSCOPY N/A 05/10/2023   Procedure: ENTEROSCOPY;  Surgeon: Jinny Carmine, MD;  Location: [facility] ENDOSCOPY;  Service: Endoscopy;  Laterality: N/A;   ENTEROSCOPY N/A 10/27/2023   Procedure: ENTEROSCOPY;  Surgeon: Unk Corinn Skiff, MD;  Location: [facility] ENDOSCOPY;  Service: Gastroenterology;  Laterality: N/A;   ESOPHAGOGASTRODUODENOSCOPY (EGD) WITH PROPOFOL  N/A 04/13/2023   Procedure: ESOPHAGOGASTRODUODENOSCOPY (EGD) WITH PROPOFOL ;  Surgeon: Onita Elspeth Sharper, DO;  Location: [facility] ENDOSCOPY;  Service: Gastroenterology;  Laterality: N/A;   EYE SURGERY     GIVENS CAPSULE STUDY N/A 05/09/2023   Procedure: GIVENS CAPSULE STUDY;  Surgeon: Jinny Carmine, MD;  Location: [facility] ENDOSCOPY;  Service: Endoscopy;  Laterality: N/A;   GIVENS CAPSULE STUDY  10/27/2023   Procedure: GIVENS CAPSULE STUDY;  Surgeon: Unk Corinn Skiff, MD;  Location: [facility] ENDOSCOPY;  Service: Gastroenterology;;   HOT HEMOSTASIS  05/10/2023   Procedure: HOT HEMOSTASIS (ARGON PLASMA COAGULATION/BICAP);  Surgeon: Jinny Carmine, MD;  Location: [facility] ENDOSCOPY;  Service: Endoscopy;;   LEG SURGERY Bilateral    stent placement   LOWER EXTREMITY ANGIOGRAPHY Right 08/19/2021   Procedure: LOWER EXTREMITY ANGIOGRAPHY;  Surgeon: Marea Selinda RAMAN, MD;  Location: [facility] INVASIVE CV LAB;  Service: Cardiovascular;  Laterality: Right;   LOWER EXTREMITY ANGIOGRAPHY Right 08/09/2023   Procedure: Lower Extremity Angiography;  Surgeon: Marea Selinda RAMAN, MD;  Location: [facility] INVASIVE CV LAB;  Service: Cardiovascular;  Laterality: Right;   LOWER EXTREMITY ANGIOGRAPHY Right 08/10/2023   Procedure: Lower Extremity Angiography;  Surgeon: Marea Selinda RAMAN, MD;  Location: [facility] INVASIVE CV LAB;  Service: Cardiovascular;  Laterality: Right;   OLECRANON BURSECTOMY Right 09/17/2024   Procedure: JENELLE SPAIN;  Surgeon: Ezra Jackquline RAMAN, MD;  Location: [facility] ORS;  Service:  Orthopedics;  Laterality: Right;   PERIPHERAL VASCULAR BALLOON ANGIOPLASTY Right 09/21/2017   Procedure: PERIPHERAL VASCULAR BALLOON ANGIOPLASTY;  Surgeon: Marea Selinda RAMAN, MD;  Location: [facility] INVASIVE CV LAB;  Service: Cardiovascular;  Laterality: Right;   REVERSE SHOULDER ARTHROPLASTY Right 11/26/2019   Procedure: REVERSE SHOULDER ARTHROPLASTY;  Surgeon: Edie Norleen PARAS, MD;  Location: [facility] ORS;  Service: Orthopedics;  Laterality: Right;   SUBMUCOSAL TATTOO INJECTION  10/27/2023   Procedure: SUBMUCOSAL TATTOO INJECTION;  Surgeon: Unk Corinn Skiff, MD;  Location: [facility] ENDOSCOPY;  Service: Gastroenterology;;     reports that he quit smoking about 25 years ago. His smoking use included cigarettes. He has never used smokeless tobacco. He reports that he does not currently use alcohol. He reports that he does not use drugs.  Allergies[1]  Family History  Problem Relation Age of Onset   Bladder Cancer Mother    Prostate cancer Father    Prostate cancer Brother    Diabetes Maternal Grandfather      Prior to Admission medications  Medication Sig Start Date End Date Taking? Authorizing Provider  doxycycline (VIBRA-TABS) 100 MG tablet Take 100 mg by mouth 2 (two) times daily. 11/14/24 11/24/24 Yes [provider]  mupirocin  ointment (BACTROBAN) 2 % Apply 1 Application topically 3 (three) times daily. For 7 days 11/14/24 11/21/24 Yes [provider]  acetaminophen  (TYLENOL ) 500 MG tablet Take 1,000 mg by mouth at bedtime.    [provider]  ALPRAZolam  (XANAX ) 0.25 MG tablet Take 0.25 mg by mouth at bedtime. 09/07/17   [provider]  aspirin  EC 81 MG tablet Take 81 mg daily by mouth.     [provider]  Cholecalciferol  25 MCG (1000 UT) capsule Take 2,000 Units by mouth daily.    [provider]  clopidogrel  (PLAVIX ) 75 MG tablet TAKE 1 TABLET BY MOUTH EVERY DAY 05/20/24   Dew, Jason S, MD  gabapentin  (NEURONTIN ) 300 MG capsule Take 300 mg by mouth  2 (two) times daily. 12/27/21 11/05/24  [provider]  hydrochlorothiazide  (HYDRODIURIL ) 25 MG tablet Take 1 tablet by mouth daily. 06/14/23   [provider]  Multiple Vitamins-Minerals (PRESERVISION AREDS 2 PO) Take 1 tablet by mouth 2 (two) times daily.    [provider]  pantoprazole  (PROTONIX ) 40 MG tablet Take 40 mg by mouth daily. 01/14/24 01/13/25  [provider]  potassium chloride  (KLOR-CON ) 10 MEQ tablet Take 10 mEq by mouth daily. 02/09/23   [provider]  pravastatin  (PRAVACHOL ) 20 MG tablet Take 20 mg by mouth at bedtime. 08/30/16   [provider]  tamsulosin  (FLOMAX ) 0.4 MG CAPS capsule Take 0.4 mg by mouth daily. 01/06/18   [provider]    Physical Exam: Vitals:   11/19/24 1141 11/19/24 1142 11/19/24 1604 11/19/24 1630  BP: (!) 143/78  (!) 184/70 (!) 176/71  Pulse: 91  81 74  Resp: 16  14   Temp: 98.4 F (36.9 C)  97.8 F (36.6 C)   TempSrc: Oral  Oral   SpO2: 96%  97% 99%  Weight:  61.7 kg    Height:  5' 6 (1.676 m)      Constitutional: NAD, calm, comfortable Vitals:   11/19/24 1141 11/19/24 1142 11/19/24 1604 11/19/24 1630  BP: (!) 143/78  (!) 184/70 (!) 176/71  Pulse: 91  81 74  Resp: 16  14   Temp: 98.4 F (36.9 C)  97.8 F (36.6 C)   TempSrc: Oral  Oral   SpO2: 96%  97% 99%  Weight:  61.7 kg    Height:  5' 6 (1.676 m)     Eyes: PERRL, lids and conjunctivae normal ENMT: Mucous membranes are moist. Posterior pharynx clear of any exudate or lesions.Normal dentition.  Neck: normal, supple, no masses, no thyromegaly Respiratory: clear to auscultation bilaterally, no wheezing, no crackles. Normal respiratory effort. No accessory muscle use.  Cardiovascular: Regular rate and rhythm, no murmurs / rubs / gallops. No extremity edema.  Diminished pedal pulses bilaterally. No carotid bruits.  Abdomen: no tenderness, no masses palpated. No hepatosplenomegaly. Bowel sounds positive.  Musculoskeletal: no  clubbing / cyanosis. No joint deformity upper and lower extremities. Good ROM, no contractures. Normal muscle tone.  Skin: Shallow ulcer of lateral side of fourth toe, with a clean base, no significant rash discharge or swelling surrounding the ulcer Neurologic: CN 2-12 grossly intact. Sensation intact, DTR normal. Strength 5/5 in all 4.  Psychiatric: Normal judgment and insight. Alert and oriented x 3. Normal mood.     Labs on Admission: I have personally reviewed following labs and imaging studies  CBC: Recent Labs  Lab 11/19/24 1147  WBC 8.9  HGB 14.7  HCT 44.1  MCV 88.2  PLT  213   Basic Metabolic Panel: Recent Labs  Lab 11/19/24 1147  NA 141  K 3.7  CL 105  CO2 21*  GLUCOSE 140*  BUN 31*  CREATININE 1.64*  CALCIUM  8.6*   GFR: Estimated Creatinine Clearance: 28.2 mL/min (A) (by C-G formula based on SCr of 1.64 mg/dL (H)). Liver Function Tests: Recent Labs  Lab 11/19/24 1147  AST 21  ALT 6  ALKPHOS 91  BILITOT 0.4  PROT 7.5  ALBUMIN 4.3   No results for input(s): LIPASE, AMYLASE in the last 168 hours. No results for input(s): AMMONIA in the last 168 hours. Coagulation Profile: No results for input(s): INR, PROTIME in the last 168 hours. Cardiac Enzymes: No results for input(s): CKTOTAL, CKMB, CKMBINDEX, TROPONINI in the last 168 hours. BNP (last 3 results) No results for input(s): PROBNP in the last 8760 hours. HbA1C: No results for input(s): HGBA1C in the last 72 hours. CBG: No results for input(s): GLUCAP in the last 168 hours. Lipid Profile: No results for input(s): CHOL, HDL, LDLCALC, TRIG, CHOLHDL, LDLDIRECT in the last 72 hours. Thyroid Function Tests: No results for input(s): TSH, T4TOTAL, FREET4, T3FREE, THYROIDAB in the last 72 hours. Anemia Panel: No results for input(s): VITAMINB12, FOLATE, FERRITIN, TIBC, IRON , RETICCTPCT in the last 72 hours. Urine analysis:    Component Value  Date/Time   COLORURINE STRAW (A) 10/25/2023 1117   APPEARANCEUR CLEAR (A) 10/25/2023 1117   APPEARANCEUR Clear 02/09/2018 1036   LABSPEC 1.028 10/25/2023 1117   LABSPEC 1.014 03/13/2013 1403   PHURINE 5.0 10/25/2023 1117   GLUCOSEU NEGATIVE 10/25/2023 1117   GLUCOSEU Negative 03/13/2013 1403   HGBUR NEGATIVE 10/25/2023 1117   BILIRUBINUR NEGATIVE 10/25/2023 1117   BILIRUBINUR Negative 02/09/2018 1036   BILIRUBINUR Negative 03/13/2013 1403   KETONESUR NEGATIVE 10/25/2023 1117   PROTEINUR NEGATIVE 10/25/2023 1117   NITRITE NEGATIVE 10/25/2023 1117   LEUKOCYTESUR NEGATIVE 10/25/2023 1117   LEUKOCYTESUR Negative 03/13/2013 1403    Radiological Exams on Admission: DG Foot Complete Right Result Date: 11/19/2024 CLINICAL DATA:  Right foot pain. EXAM: RIGHT FOOT COMPLETE - 3+ VIEW COMPARISON:  None Available. 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 Moderate L2 valgus deformity. Mild-to-moderate degenerative changes of the first MTP joint and first interphalangeal joint. No acute fracture or dislocation chronic minimal degenerate change over the midfoot. IMPRESSION: 1. No acute findings. 2. Degenerative changes as described. 3. 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 Electronically Signed   By: Toribio Agreste M.D.   On: 11/19/2024 12:14    EKG: None  Assessment/Plan Principal Problem:   Limb ischemia Active Problems:   Lower limb ischemia  (please populate well all problems here in Problem List. (For example, if patient is on BP meds at home and you resume or decide to hold them, it is a problem that needs to be her. Same for CAD, COPD, HLD and so on)  Right fourth toe pressure ulcer History of PVD Resting pain - With resting pain, clinically suspect developing toe ischemia - Vascular surgeon consulted by ED physician, n.p.o. at midnight, vascular surgeon will see the patient tomorrow. - No significant signs of  infection of the right fourth toe ulcer, patient does not have systemic inflammatory signs such as fever or leukocytosis, plan to hold off antibiotics for now. - Podiatry also consulted. - Continue aspirin , hold off Plavix  today - Continue statin  CKD stage IIIb - Euvolemic and creatinine level stable  Peripheral  neuropathy - Continue gabapentin   HTN - Discontinue hydrochlorothiazide  for patient advanced kidney disease Changed to amlodipine -    DVT prophylaxis: Heparin  drip Code Status: Full code Family Communication: Daughter at bedside Disposition Plan: Patient is sick with developing toe ischemia requiring inpatient vascular surgery consultation and procedure, likely will need more than 2 midnight hospital stay Consults called: Vascular surgeon Dr. Marea And podiatry Dr. Tanda Admission status: Telemetry admission   Cort ONEIDA Mana MD [facility] Pager [phone]  11/19/2024, 5:42 PM       [1]  Allergies Allergen Reactions   Codeine Itching   "

## 2024-11-19 NOTE — Consult Note (Signed)
 ED Pharmacy Antibiotic Sign Off An antibiotic consult was received from an ED provider for wound infection per pharmacy dosing for vancomycin . A chart review was completed to assess appropriateness.   The following one time order(s) were placed:  Vancomycin  1250 mg IV x1  Further antibiotic and/or antibiotic pharmacy consults should be ordered by the admitting provider if indicated.   Thank you for allowing pharmacy to be a part of this patient's care.   Leonor JAYSON Argyle, PharmD Pharmacy Resident  11/19/2024 5:39 PM

## 2024-11-19 NOTE — Consult Note (Signed)
 PHARMACY - ANTICOAGULATION CONSULT NOTE  Pharmacy Consult for heparin  dosing Indication: PAD  Allergies[1]  Patient Measurements: Height: 5' 6 (167.6 cm) Weight: 61.7 kg (136 lb) IBW/kg (Calculated) : 63.8 HEPARIN  DW (KG): 61.7  Vital Signs: Temp: 97.8 F (36.6 C) (01/13 1604) Temp Source: Oral (01/13 1604) BP: 187/64 (01/13 1745) Pulse Rate: 72 (01/13 1745)  Labs: Recent Labs    11/19/24 1147  HGB 14.7  HCT 44.1  PLT 213  CREATININE 1.64*    Estimated Creatinine Clearance: 28.2 mL/min (A) (by C-G formula based on SCr of 1.64 mg/dL (H)).   Medical History: Past Medical History:  Diagnosis Date   AAA (abdominal aortic aneurysm) without rupture 04/05/2017   Anemia    Anxiety    a.) takes BZO (alprazolam ) as needed   Arthritis    Atherosclerosis of native arteries of extremity with intermittent claudication 08/10/2021   4 in legs   Bilateral cataracts    BPH (benign prostatic hyperplasia)    Carotid arterial disease    a.) s/p RICA stent placement   Cholelithiasis    CKD (chronic kidney disease), stage III (HCC)    right kidney doesn't work   Colon polyps    COPD (chronic obstructive pulmonary disease) (HCC)    Coronary artery disease    Erectile dysfunction    GERD (gastroesophageal reflux disease)    History of hiatal hernia    Hyperlipidemia    Hypertension    Hypokalemia 05/08/2023   Long-term use of aspirin  therapy    Nephrolithiasis    Olecranon bursitis, right elbow 05/25/2020   On long term clopidogrel  therapy    Penetrating atherosclerotic ulcer of aorta 03/22/2023   Peripheral vascular disease 04/05/2017   Psoriasis    Sigmoid diverticulosis    Type 2 MI (myocardial infarction) in setting of anemia 10/25/2023   Upper gastrointestinal hemorrhage due to angiodysplasia 05/10/2023   Wears dentures    full upper and lower    Medications:  No PTA anticoagulation  Assessment: Bryan Welch is an 34yoM presenting with concerns for right  toe pain and PAD. Past medical history is notable for PVD status post bilateral lower extremity angioplasty and stenting, and RLE revascularization in 2018, CKD stage IIIb, chronic iron  deficiency anemia secondary to GI bleed, HTN, and AAA. Denies diabetes. Also had a toe on the right food removed a couple years ago due to infection.  On PTA clopidogrel  and low-dose aspirin . At rest, patient endorses right toe pain. Vascular surgery has been consulted and will see patient tomorrow. Podiatry also consulted. Per provider, to continue aspirin  and hold off on clopidogrel  today.   Glucose 140 on admission, and unknown last A1c. Vascular US  ABI, carotid, and AAA ordered. No acute findings in right foot xray.  Goal of Therapy:  Heparin  level 0.3-0.7 units/ml Monitor platelets by anticoagulation protocol: Yes   Plan:  Consult indication PAD, will initiate on low end of VTE dose range Give 3700 units bolus x 1 Start heparin  infusion at 950 units/hr Check anti-Xa level in 8 hours and daily while on heparin  Continue to monitor H&H and platelets  Bryan Welch Bryan Welch 11/19/2024,5:46 PM      [1]  Allergies Allergen Reactions   Codeine Itching

## 2024-11-19 NOTE — ED Notes (Signed)
"  Hospitalist at bedside assessing patient  "

## 2024-11-19 NOTE — ED Provider Notes (Signed)
 "  [facility] Provider Note    Event Date/Time   First MD Initiated Contact with Patient 11/19/24 1513     (approximate)   History   Foot Pain   HPI  Bryan Welch is a 89 y.o. male with prior foot amputation secondary to infection who comes in with right foot pain for about a week.  Patient is a sore between his 4th and 5th toes.  He reports the pain is worse at night and feels electrical shocks.   I reviewed a note from 1/8 where patient was seen over at the internal medicine clinic where patient was placed on doxycycline for 10 days and recommended using mupirocin ointment 3 times daily with plan to follow-up outpatient with podiatry.  Patient denies fevers, redness.  He states that he came in just because of the pain was changing.  He has not followed up with [facility] clinic podiatry yet.  He followed with Dr. Lennie previously.  Patient does report stents placed in his legs previously and is currently on Plavix .  Physical Exam   Triage Vital Signs: ED Triage Vitals  Encounter Vitals Group     BP 11/19/24 1141 (!) 143/78     Girls Systolic BP Percentile --      Girls Diastolic BP Percentile --      Boys Systolic BP Percentile --      Boys Diastolic BP Percentile --      Pulse Rate 11/19/24 1141 91     Resp 11/19/24 1141 16     Temp 11/19/24 1141 98.4 F (36.9 C)     Temp Source 11/19/24 1141 Oral     SpO2 11/19/24 1141 96 %     Weight 11/19/24 1142 136 lb (61.7 kg)     Height 11/19/24 1142 5' 6 (1.676 m)     Head Circumference --      Peak Flow --      Pain Score 11/19/24 1142 8     Pain Loc --      Pain Education --      Exclude from Growth Chart --     Most recent vital signs: Vitals:   11/19/24 1141  BP: (!) 143/78  Pulse: 91  Resp: 16  Temp: 98.4 F (36.9 C)  SpO2: 96%     General: Awake, no distress.  CV:  Good peripheral perfusion.  Resp:  Normal effort.  Abd:  No distention.  Other:  See media tab.  Patient has dime  size ulceration on his fourth digit R foot towards the fifth toe.  No surrounding redness or erythema.  Patient has a little bit of purpleish discoloration over 3 of the toes.  He has good PT pulse but difficult to palpate DP pulse  ED Results / Procedures / Treatments   Labs (all labs ordered are listed, but only abnormal results are displayed) Labs Reviewed  COMPREHENSIVE METABOLIC PANEL WITH GFR - Abnormal; Notable for the following components:      Result Value   CO2 21 (*)    Glucose, Bld 140 (*)    BUN 31 (*)    Creatinine, Ser 1.64 (*)    Calcium  8.6 (*)    GFR, Estimated 40 (*)    All other components within normal limits  CBC      RADIOLOGY I have reviewed the xray personally and interpreted no evidence of any fracture   PROCEDURES:  Critical Care performed: No  .Critical Care  Performed  by: Ernest Ronal BRAVO, MD Authorized by: Ernest Ronal BRAVO, MD   Critical care provider statement:    Critical care time (minutes):  30   Critical care was necessary to treat or prevent imminent or life-threatening deterioration of the following conditions: vascular insuffiency requiring heparin .   Critical care was time spent personally by me on the following activities:  Development of treatment plan with patient or surrogate, discussions with consultants, evaluation of patient's response to treatment, examination of patient, ordering and review of laboratory studies, ordering and review of radiographic studies, ordering and performing treatments and interventions, pulse oximetry, re-evaluation of patient's condition and review of old charts    MEDICATIONS ORDERED IN ED: Medications - No data to display   IMPRESSION / MDM / ASSESSMENT AND PLAN / ED COURSE  I reviewed the triage vital signs and the nursing notes.   Patient's presentation is most consistent with acute presentation with potential threat to life or bodily function.  Patient comes in with concerns for toe pain.   Patient has a ulceration on the inside of his fourth digit between the 4th and 5th digit.  Patient has history of vascular insufficiency but his pulses are palpated both DP and PT.  There is no evidence of cellulitis.  Patient denies any fevers.  We discussed pain management he is currently taking Tylenol .  He denies any ibuprofen.  No fevers, no white count seems less likely osteomyelitis.  Will discuss with podiatry I did upload some pictures in the media tab.  IMPRESSION: 1. No acute findings. 2. Degenerative changes as described. 3. 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   CMP was reassuring with creatinine around baseline.  CBC without any evidence of white count.  Discussed case with podiatry Dr. Tanda and vascular Dr. Marea.  Given patient failing outpatient antibiotics and poor blood flow will discuss with with the hospital team for admission.  Will place patient on heparin  which vascular is agreeable to.  Prior to starting heparin  he denies any falls hitting his head denies any abdominal pain and denies any bleeding issues.  Patient covered broadly with antibiotics.  Blood cultures were not ordered as patient does not meet sepsis   FINAL CLINICAL IMPRESSION(S) / ED DIAGNOSES   Final diagnoses:  Skin ulcer of toe of right foot, limited to breakdown of skin [facility])  Vascular insufficiency     Rx / DC Orders   ED Discharge Orders     None        Note:  This document was prepared using Dragon voice recognition software and may include unintentional dictation errors.   Ernest Ronal BRAVO, MD 11/19/24 1736  "

## 2024-11-19 NOTE — ED Notes (Signed)
 Receiving RN made aware of need for heparin  level prior to starting heparin  gtt; lab aware of need for heparin  to be drawn as patient is a hard stick.

## 2024-11-19 NOTE — Plan of Care (Signed)
" °  Problem: Education: Goal: Knowledge of General Education information will improve Description: Including pain rating scale, medication(s)/side effects and non-pharmacologic comfort measures Outcome: Progressing   Problem: Health Behavior/Discharge Planning: Goal: Ability to manage health-related needs will improve Outcome: Progressing   Problem: Clinical Measurements: Goal: Ability to maintain clinical measurements within normal limits will improve Outcome: Progressing Goal: Diagnostic test results will improve Outcome: Progressing Goal: Cardiovascular complication will be avoided Outcome: Progressing   Problem: Clinical Measurements: Goal: Respiratory complications will improve Outcome: Not Applicable   "

## 2024-11-19 NOTE — ED Triage Notes (Signed)
 Pt states that he had a toe removed on the right foot a couple years ago due to infection, denies diabetes. Pt has been having right foot pain for the past week, pt has a sore between the 4th and 5th toes. Pt states that the pain is worse at night and states that it feels like electrical shocks. Pulse was palpated by first nurse

## 2024-11-20 DIAGNOSIS — I10 Essential (primary) hypertension: Secondary | ICD-10-CM

## 2024-11-20 DIAGNOSIS — L97519 Non-pressure chronic ulcer of other part of right foot with unspecified severity: Secondary | ICD-10-CM

## 2024-11-20 DIAGNOSIS — Z79899 Other long term (current) drug therapy: Secondary | ICD-10-CM | POA: Diagnosis not present

## 2024-11-20 DIAGNOSIS — Z7902 Long term (current) use of antithrombotics/antiplatelets: Secondary | ICD-10-CM

## 2024-11-20 DIAGNOSIS — Z95828 Presence of other vascular implants and grafts: Secondary | ICD-10-CM | POA: Diagnosis not present

## 2024-11-20 DIAGNOSIS — I998 Other disorder of circulatory system: Secondary | ICD-10-CM | POA: Diagnosis not present

## 2024-11-20 DIAGNOSIS — I714 Abdominal aortic aneurysm, without rupture, unspecified: Secondary | ICD-10-CM

## 2024-11-20 DIAGNOSIS — Z7982 Long term (current) use of aspirin: Secondary | ICD-10-CM

## 2024-11-20 DIAGNOSIS — Z9889 Other specified postprocedural states: Secondary | ICD-10-CM

## 2024-11-20 DIAGNOSIS — Z87891 Personal history of nicotine dependence: Secondary | ICD-10-CM | POA: Diagnosis not present

## 2024-11-20 DIAGNOSIS — I70235 Atherosclerosis of native arteries of right leg with ulceration of other part of foot: Secondary | ICD-10-CM

## 2024-11-20 LAB — CBC
HCT: 40.4 % (ref 39.0–52.0)
Hemoglobin: 13.5 g/dL (ref 13.0–17.0)
MCH: 29.3 pg (ref 26.0–34.0)
MCHC: 33.4 g/dL (ref 30.0–36.0)
MCV: 87.8 fL (ref 80.0–100.0)
Platelets: 202 K/uL (ref 150–400)
RBC: 4.6 MIL/uL (ref 4.22–5.81)
RDW: 13.4 % (ref 11.5–15.5)
WBC: 7.9 K/uL (ref 4.0–10.5)
nRBC: 0 % (ref 0.0–0.2)

## 2024-11-20 LAB — HEPARIN LEVEL (UNFRACTIONATED)
Heparin Unfractionated: 0.56 [IU]/mL (ref 0.30–0.70)
Heparin Unfractionated: 0.67 [IU]/mL (ref 0.30–0.70)

## 2024-11-20 MED ORDER — OXYCODONE HCL 5 MG PO TABS
10.0000 mg | ORAL_TABLET | Freq: Four times a day (QID) | ORAL | Status: DC | PRN
  Administered 2024-11-20 – 2024-11-23 (×4): 10 mg via ORAL
  Filled 2024-11-20 (×4): qty 2

## 2024-11-20 MED ORDER — SODIUM CHLORIDE 0.9 % IV SOLN: INTRAVENOUS | Status: DC

## 2024-11-20 MED ORDER — ATORVASTATIN CALCIUM 20 MG PO TABS
80.0000 mg | ORAL_TABLET | Freq: Every day | ORAL | Status: DC
  Administered 2024-11-20 – 2024-11-22 (×3): 80 mg via ORAL
  Filled 2024-11-20 (×3): qty 4

## 2024-11-20 MED ORDER — CEFAZOLIN SODIUM-DEXTROSE 2-4 GM/100ML-% IV SOLN
2.0000 g | INTRAVENOUS | Status: AC
  Administered 2024-11-21: 2 g via INTRAVENOUS
  Filled 2024-11-20: qty 100

## 2024-11-20 NOTE — H&P (View-Only) (Signed)
 " [facility] Vascular Consult Note  MRN : [id number]  Bryan Welch is a 89 y.o. (08/01/1936) male who presents with chief complaint of  Chief Complaint  Patient presents with   Foot Pain  .   Consulting Physician:Prince Dorinda, MD Reason for consult: Rest pain History of Present Illness: Bryan Welch is an 89 year old male who is well-known to the vascular service.  Has a previous medical history of peripheral arterial disease with multiple bilateral lower extremity angiogram and stenting.  His most recent was a right lower extremity intervention in 2018.  He also followed the patient for an abdominal aortic aneurysm.  He has a previous medical history of chronic kidney disease, coronary artery disease, carotid artery stenosis and GI bleed.  He was most recently seen by us  in November 05, 2024.  At that time he did have notably reduced ABIs but he was asymptomatic at that time.  He notes that about a week or so after being seen in our office he began to experience a stabbing throbbing sensation in his toe.  He also noted that the wound has been there for approximately the same amount of time as well.  His most recent study showed a right leg ABI 0.56.  Current Facility-Administered Medications  Medication Dose Route Frequency Provider Last Rate Last Admin   0.9 %  sodium chloride  infusion   Intravenous Continuous Dew, Jason S, MD 20 mL/hr at 11/20/24 2021 New Bag at 11/20/24 2021   acetaminophen  (TYLENOL ) tablet 650 mg  650 mg Oral Q6H PRN Laurita Cort DASEN, MD   650 mg at 11/20/24 0946   Or   acetaminophen  (TYLENOL ) suppository 650 mg  650 mg Rectal Q6H PRN Laurita Cort T, MD       ALPRAZolam  (XANAX ) tablet 0.25 mg  0.25 mg Oral QHS Laurita Cort T, MD   0.25 mg at 11/19/24 2135   amLODipine  (NORVASC ) tablet 10 mg  10 mg Oral Daily Laurita Cort T, MD   10 mg at 11/20/24 0946   aspirin  EC tablet 81 mg  81 mg Oral Daily Laurita Cort T, MD   81 mg at 11/20/24 9053    atorvastatin  (LIPITOR) tablet 80 mg  80 mg Oral Daily Djan, Prince T, MD       ceFAZolin  (ANCEF ) IVPB 2g/100 mL premix  2 g Intravenous 30 min Pre-Op Marea Selinda GORMAN, MD       clopidogrel  (PLAVIX ) tablet 75 mg  75 mg Oral Daily Zhang, Ping T, MD       gabapentin  (NEURONTIN ) capsule 300 mg  300 mg Oral BID Laurita Cort T, MD   300 mg at 11/20/24 0946   heparin  ADULT infusion 100 units/mL (25000 units/250mL)  950 Units/hr Intravenous Continuous Viviana Leonor BROCKS, [place] 9.5 mL/hr at 11/20/24 1743 950 Units/hr at 11/20/24 1743   hydrALAZINE  (APRESOLINE ) injection 5 mg  5 mg Intravenous Q6H PRN Laurita Cort T, MD       HYDROmorphone  (DILAUDID ) tablet 1 mg  1 mg Oral Q4H PRN Laurita Cort T, MD   1 mg at 11/20/24 1213   ondansetron  (ZOFRAN ) tablet 4 mg  4 mg Oral Q6H PRN Laurita Cort DASEN, MD       Or   ondansetron  (ZOFRAN ) injection 4 mg  4 mg Intravenous Q6H PRN Laurita Cort DASEN, MD       Oral care mouth rinse  15 mL Mouth Rinse PRN Laurita Cort DASEN, MD  oxyCODONE  (Oxy IR/ROXICODONE ) immediate release tablet 10 mg  10 mg Oral Q6H PRN Dorinda Drue DASEN, MD   10 mg at 11/20/24 1738   pantoprazole  (PROTONIX ) EC tablet 40 mg  40 mg Oral Daily Laurita Manor T, MD   40 mg at 11/20/24 0946   senna-docusate (Senokot-S) tablet 1 tablet  1 tablet Oral QHS PRN Laurita Manor DASEN, MD       tamsulosin  (FLOMAX ) capsule 0.4 mg  0.4 mg Oral Daily Laurita Manor T, MD   0.4 mg at 11/20/24 9053   traZODone  (DESYREL ) tablet 25 mg  25 mg Oral QHS PRN Laurita Manor DASEN, MD        Past Medical History:  Diagnosis Date   AAA (abdominal aortic aneurysm) without rupture 04/05/2017   Anemia    Anxiety    a.) takes BZO (alprazolam ) as needed   Arthritis    Atherosclerosis of native arteries of extremity with intermittent claudication 08/10/2021   4 in legs   Bilateral cataracts    BPH (benign prostatic hyperplasia)    Carotid arterial disease    a.) s/p RICA stent placement   Cholelithiasis    CKD (chronic kidney disease), stage III (HCC)     right kidney doesn't work   Colon polyps    COPD (chronic obstructive pulmonary disease) (HCC)    Coronary artery disease    Erectile dysfunction    GERD (gastroesophageal reflux disease)    History of hiatal hernia    Hyperlipidemia    Hypertension    Hypokalemia 05/08/2023   Long-term use of aspirin  therapy    Nephrolithiasis    Olecranon bursitis, right elbow 05/25/2020   On long term clopidogrel  therapy    Penetrating atherosclerotic ulcer of aorta 03/22/2023   Peripheral vascular disease 04/05/2017   Psoriasis    Sigmoid diverticulosis    Type 2 MI (myocardial infarction) in setting of anemia 10/25/2023   Upper gastrointestinal hemorrhage due to angiodysplasia 05/10/2023   Wears dentures    full upper and lower    Past Surgical History:  Procedure Laterality Date   AMPUTATION TOE Right 09/16/2021   Procedure: AMPUTATION TOE;  Surgeon: Lennie Barter, DPM;  Location: [facility];  Service: Podiatry;  Laterality: Right;   BACK SURGERY     CAROTID STENT Right    CATARACT EXTRACTION W/PHACO Left 06/17/2019   Procedure: CATARACT EXTRACTION PHACO AND INTRAOCULAR LENS PLACEMENT (IOC) LEFT;  Surgeon: Myrna Adine Anes, MD;  Location: [facility];  Service: Ophthalmology;  Laterality: Left;   CATARACT EXTRACTION W/PHACO Right 07/08/2019   Procedure: CATARACT EXTRACTION PHACO AND INTRAOCULAR LENS PLACEMENT (IOC) right  00:39.2  10.8%  5.02  ;  Surgeon: Myrna Adine Anes, MD;  Location: [facility];  Service: Ophthalmology;  Laterality: Right;   COLONOSCOPY WITH PROPOFOL  N/A 04/13/2023   Procedure: COLONOSCOPY WITH PROPOFOL ;  Surgeon: Onita Elspeth Sharper, DO;  Location: [facility] ENDOSCOPY;  Service: Gastroenterology;  Laterality: N/A;   ENTEROSCOPY N/A 05/10/2023   Procedure: ENTEROSCOPY;  Surgeon: Jinny Carmine, MD;  Location: [facility] ENDOSCOPY;  Service: Endoscopy;  Laterality: N/A;   ENTEROSCOPY N/A 10/27/2023   Procedure: ENTEROSCOPY;  Surgeon: Unk Corinn Skiff, MD;  Location: [facility] ENDOSCOPY;  Service: Gastroenterology;  Laterality: N/A;   ESOPHAGOGASTRODUODENOSCOPY (EGD) WITH PROPOFOL  N/A 04/13/2023   Procedure: ESOPHAGOGASTRODUODENOSCOPY (EGD) WITH PROPOFOL ;  Surgeon: Onita Elspeth Sharper, DO;  Location: [facility] ENDOSCOPY;  Service: Gastroenterology;  Laterality: N/A;   EYE SURGERY     GIVENS CAPSULE STUDY N/A 05/09/2023  Procedure: GIVENS CAPSULE STUDY;  Surgeon: Jinny Carmine, MD;  Location: [facility] ENDOSCOPY;  Service: Endoscopy;  Laterality: N/A;   GIVENS CAPSULE STUDY  10/27/2023   Procedure: GIVENS CAPSULE STUDY;  Surgeon: Unk Corinn Skiff, MD;  Location: [facility] ENDOSCOPY;  Service: Gastroenterology;;   HOT HEMOSTASIS  05/10/2023   Procedure: HOT HEMOSTASIS (ARGON PLASMA COAGULATION/BICAP);  Surgeon: Jinny Carmine, MD;  Location: [facility] ENDOSCOPY;  Service: Endoscopy;;   LEG SURGERY Bilateral    stent placement   LOWER EXTREMITY ANGIOGRAPHY Right 08/19/2021   Procedure: LOWER EXTREMITY ANGIOGRAPHY;  Surgeon: Marea Selinda RAMAN, MD;  Location: [facility] INVASIVE CV LAB;  Service: Cardiovascular;  Laterality: Right;   LOWER EXTREMITY ANGIOGRAPHY Right 08/09/2023   Procedure: Lower Extremity Angiography;  Surgeon: Marea Selinda RAMAN, MD;  Location: [facility] INV[facility] CV LAB;  Service: Cardiovascular;  Laterality: Right;   LOWER EXTREMITY ANGIOGRAPHY Right 08/10/2023   Procedure: Lower Extremity Angiography;  Surgeon: Marea Selinda RAMAN, MD;  Location: [facility] INVASIVE CV LAB;  Service: Cardiovascular;  Laterality: Right;   OLECRANON BURSECTOMY Right 09/17/2024   Procedure: JENELLE SPAIN;  Surgeon: Ezra Jackquline RAMAN, MD;  Location: [facility] ORS;  Service: Orthopedics;  Laterality: Right;   PERIPHERAL VASCULAR BALLOON ANGIOPLASTY Right 09/21/2017   Procedure: PERIPHERAL VASCULAR BALLOON ANGIOPLASTY;  Surgeon: Marea Selinda RAMAN, MD;  Location: [facility] INV[facility] CV LAB;  Service: Cardiovascular;  Laterality: Right;   REVERSE SHOULDER ARTHROPLASTY Right 11/26/2019   Procedure: REVERSE SHOULDER  ARTHROPLASTY;  Surgeon: Edie Norleen PARAS, MD;  Location: [facility] ORS;  Service: Orthopedics;  Laterality: Right;   SUBMUCOSAL TATTOO INJECTION  10/27/2023   Procedure: SUBMUCOSAL TATTOO INJECTION;  Surgeon: Unk Corinn Skiff, MD;  Location: [facility] ENDOSCOPY;  Service: Gastroenterology;;    Social History Social History[1]  Family History Family History  Problem Relation Age of Onset   Bladder Cancer Mother    Prostate cancer Father    Prostate cancer Brother    Diabetes Maternal Grandfather     Allergies[2]   REVIEW OF SYSTEMS (Negative unless checked)  Constitutional: [] Weight loss  [] Fever  [] Chills Cardiac: [] Chest pain   [] Chest pressure   [] Palpitations   [] Shortness of breath when laying flat   [] Shortness of breath at rest   [] Shortness of breath with exertion. Vascular:  [] Pain in legs with walking   [] Pain in legs at rest   [] Pain in legs when laying flat   [] Claudication   [x] Pain in feet when walking  [x] Pain in feet at rest  [x] Pain in feet when laying flat   [] History of DVT   [] Phlebitis   [] Swelling in legs   [] Varicose veins   [] Non-healing ulcers Pulmonary:   [] Uses home oxygen   [] Productive cough   [] Hemoptysis   [] Wheeze  [] COPD   [] Asthma Neurologic:  [] Dizziness  [] Blackouts   [] Seizures   [] History of stroke   [] History of TIA  [] Aphasia   [] Temporary blindness   [] Dysphagia   [] Weakness or numbness in arms   [] Weakness or numbness in legs Musculoskeletal:  [] Arthritis   [] Joint swelling   [] Joint pain   [] Low back pain Hematologic:  [] Easy bruising  [] Easy bleeding   [] Hypercoagulable state   [] Anemic  [] Hepatitis Gastrointestinal:  [] Blood in stool   [] Vomiting blood  [] Gastroesophageal reflux/heartburn   [] Difficulty swallowing. Genitourinary:  [] Chronic kidney disease   [] Difficult urination  [] Frequent urination  [] Burning with urination   [] Blood in urine Skin:  [] Rashes   [x] Ulcers   [] Wounds Psychological:  [] History of anxiety   []  History of  major  depression.  Physical Examination  Vitals:   11/20/24 0425 11/20/24 0838 11/20/24 1512 11/20/24 2031  BP: (!) 101/41 119/61 (!) 120/55 (!) 128/54  Pulse: 74 77 73 76  Resp: 14 18 16 16   Temp: 97.7 F (36.5 C) 97.6 F (36.4 C) 97.6 F (36.4 C) 98.2 F (36.8 C)  TempSrc: Oral Oral  Oral  SpO2: 96% 95% 96% 97%  Weight:      Height:       Body mass index is 21.95 kg/m. Gen:  WD/WN, NAD Head: [place]/AT, No temporalis wasting. Prominent temp pulse not noted. Ear/Nose/Throat: Hearing grossly intact, nares w/o erythema or drainage, oropharynx w/o Erythema/Exudate Eyes: Sclera non-icteric, conjunctiva clear Neck: Trachea midline.  No JVD.  Pulmonary:  Good air movement, respirations not labored, equal bilaterally.  Cardiac: RRR, normal S1, S2. Vascular:  Feet are warm bilaterally but pulses are nonpalpable bilaterally  Gastrointestinal: soft, non-tender/non-distended. No guarding/reflex.  Musculoskeletal: M/S 5/5 throughout.  Extremities without ischemic changes.  No deformity or atrophy. No edema. Neurologic: Sensation grossly intact in extremities.  Symmetrical.  Speech is fluent. Motor exam as listed above. Psychiatric: Judgment intact, Mood & affect appropriate for pt's clinical situation. Dermatologic: Shallow ulcer on the lateral side of his fourth toe.  Small area site second toe Lymph : No Cervical, Axillary, or Inguinal lymphadenopathy.    CBC Lab Results  Component Value Date   WBC 7.9 11/20/2024   HGB 13.5 11/20/2024   HCT 40.4 11/20/2024   MCV 87.8 11/20/2024   PLT 202 11/20/2024    BMET    Component Value Date/Time   NA 141 11/19/2024 1147   NA 141 03/22/2013 0237   K 3.7 11/19/2024 1147   K 4.3 03/22/2013 0237   CL 105 11/19/2024 1147   CL 110 (H) 03/22/2013 0237   CO2 21 (L) 11/19/2024 1147   CO2 24 03/22/2013 0237   GLUCOSE 140 (H) 11/19/2024 1147   GLUCOSE 101 (H) 03/22/2013 0237   BUN 31 (H) 11/19/2024 1147   BUN 19 (H) 03/22/2013 0237    CREATININE 1.64 (H) 11/19/2024 1147   CREATININE 1.72 (H) 09/23/2024 0946   CREATININE 1.53 (H) 03/22/2013 0237   CALCIUM  8.6 (L) 11/19/2024 1147   CALCIUM  8.1 (L) 03/22/2013 0237   GFRNONAA 40 (L) 11/19/2024 1147   GFRNONAA 38 (L) 09/23/2024 0946   GFRNONAA 44 (L) 03/22/2013 0237   GFRAA 43 (L) 11/21/2019 0911   GFRAA 51 (L) 03/22/2013 0237   Estimated Creatinine Clearance: 28.2 mL/min (A) (by C-G formula based on SCr of 1.64 mg/dL (H)).  COAG Lab Results  Component Value Date   INR 1.2 11/19/2024   INR 2.0 (H) 10/25/2023   INR 1.2 06/08/2023    Radiology DG Foot Complete Right Result Date: 11/19/2024 CLINICAL DATA:  Right foot pain. EXAM: RIGHT FOOT COMPLETE - 3+ VIEW COMPARISON:  None Available. 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 Moderate L2 valgus deformity. Mild-to-moderate degenerative changes of the first MTP joint and first interphalangeal joint. No acute fracture or dislocation chronic minimal degenerate change over the midfoot. IMPRESSION: 1. No acute findings. 2. Degenerative changes as described. 3. 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 Electronically Signed   By: Toribio Agreste M.D.   On: 11/19/2024 12:14   VAS US  CAROTID Result Date: 11/06/2024 Carotid Arterial Duplex Study Patient Name:  Bryan Welch  Date of Exam:   11/05/2024 Medical Rec #: [id number]  Accession #:    [id number] Date of Birth: Apr 04, 1936          Patient Gender: M Patient Age:   80 years Exam Location:  [facility] Procedure:      VAS US  CAROTID Referring Phys: SELINDA GU --------------------------------------------------------------------------------  Performing Technologist: Jerel Croak RVT  Examination Guidelines: A complete evaluation includes B-mode imaging, spectral Doppler, color Doppler, and power Doppler as needed of all accessible portions of each vessel. Bilateral testing is considered an  integral part of a complete examination. Limited examinations for reoccurring indications may be performed as noted.  Right Carotid Findings: +----------+--------+--------+--------+----------------------+--------+           PSV cm/sEDV cm/sStenosisPlaque Description    Comments +----------+--------+--------+--------+----------------------+--------+ CCA Prox  96      10                                             +----------+--------+--------+--------+----------------------+--------+ CCA Mid   118     21      <50%    calcific and irregular         +----------+--------+--------+--------+----------------------+--------+ CCA Distal91      5                                              +----------+--------+--------+--------+----------------------+--------+ ICA Prox  66      16                                             +----------+--------+--------+--------+----------------------+--------+ ICA Mid   70      11                                             +----------+--------+--------+--------+----------------------+--------+ ICA Distal76      10      1-39%                                  +----------+--------+--------+--------+----------------------+--------+ ECA       77      1                                              +----------+--------+--------+--------+----------------------+--------+ +----------+--------+-------+----------------+-------------------+           PSV cm/sEDV cmsDescribe        Arm Pressure (mmHG) +----------+--------+-------+----------------+-------------------+ Subclavian105            Multiphasic, WNL                    +----------+--------+-------+----------------+-------------------+ +---------+--------+--+--------+-+---------+ VertebralPSV cm/s23EDV cm/s4Antegrade +---------+--------+--+--------+-+---------+  Left Carotid Findings: +----------+--------+--------+--------+----------------------+--------+           PSV  cm/sEDV cm/sStenosisPlaque Description    Comments +----------+--------+--------+--------+----------------------+--------+ CCA Prox  112     26                                             +----------+--------+--------+--------+----------------------+--------+  CCA Mid   113     20                                             +----------+--------+--------+--------+----------------------+--------+ CCA Distal174     29      >50%    calcific and irregular         +----------+--------+--------+--------+----------------------+--------+ ICA Prox  175     29      40-59%  calcific and irregular         +----------+--------+--------+--------+----------------------+--------+ ICA Mid   121     20                                             +----------+--------+--------+--------+----------------------+--------+ ICA Distal81      23                                             +----------+--------+--------+--------+----------------------+--------+ ECA       78      3                                              +----------+--------+--------+--------+----------------------+--------+ +----------+--------+--------+----------------+-------------------+           PSV cm/sEDV cm/sDescribe        Arm Pressure (mmHG) +----------+--------+--------+----------------+-------------------+ [id number]              Multiphasic, WNL                    +----------+--------+--------+----------------+-------------------+ +---------+--------+--+--------+--+---------+ VertebralPSV cm/s53EDV cm/s13Antegrade +---------+--------+--+--------+--+---------+   Summary: Right Carotid: Velocities in the right ICA are consistent with a 1-39% stenosis.                Non-hemodynamically significant plaque <50% noted in the CCA. The                ECA appears <50% stenosed. Left Carotid: Velocities in the left ICA are consistent with a 40-59% stenosis.               Hemodynamically  significant plaque >50% visualized in the CCA. The               ECA appears <50% stenosed. Vertebrals:  Bilateral vertebral arteries demonstrate antegrade flow. Subclavians: Normal flow hemodynamics were seen in bilateral subclavian              arteries. *See table(s) above for measurements and observations.  Electronically signed by Selinda Gu MD on 11/06/2024 at 8:02:10 AM.    Final    VAS US  AAA DUPLEX Result Date: 11/06/2024 ABDOMINAL AORTA STUDY Patient Name:  Bryan Welch  Date of Exam:   11/05/2024 Medical Rec #: [id number]          Accession #:    [id number] Date of Birth: [birth year]          Patient Gender: M Patient Age:   68 years Exam Location:  [facility] Procedure:      VAS US  AAA DUPLEX Referring Phys: JASON  DEW --------------------------------------------------------------------------------  Indications: Follow up exam for known AAA. Risk Factors: Hypertension, hyperlipidemia, prior MI, coronary artery disease. Vascular Interventions: 08/09/2023 Thrombectomy, PTA and lysis SFA to peroneal                         08/10/2023 SFA stent and tib per PTA.  Comparison Study: 07/2024 Performing Technologist: Jerel Croak RVT  Examination Guidelines: A complete evaluation includes B-mode imaging, spectral Doppler, color Doppler, and power Doppler as needed of all accessible portions of each vessel. Bilateral testing is considered an integral part of a complete examination. Limited examinations for reoccurring indications may be performed as noted.  Abdominal Aorta Findings: +-----------+-------+----------+----------+--------+--------+--------+ Location   AP (cm)Trans (cm)PSV (cm/s)WaveformThrombusComments +-----------+-------+----------+----------+--------+--------+--------+ Proximal   2.25   2.22      58                                 +-----------+-------+----------+----------+--------+--------+--------+ Mid        4.06   4.14      47                                  +-----------+-------+----------+----------+--------+--------+--------+ Distal     1.92   2.00      43                                 +-----------+-------+----------+----------+--------+--------+--------+ RT CIA Prox0.9    1.0       77                                 +-----------+-------+----------+----------+--------+--------+--------+ LT CIA Prox0.7    0.9       128                                +-----------+-------+----------+----------+--------+--------+--------+  Summary: Abdominal Aorta: There is evidence of abnormal dilatation of the mid Abdominal aorta. The largest aortic measurement is 4.1 cm. Tortuous abdominal aorta which causes varied measurements from multiple previous ultrasounds. From a longitudinal duplex view the fusiform AAA, as seen by previous CTA is infrarenal and in the mid abdominal aorta. Distal transthoracic/proximal abdominal aorta is of normal caliber then dilates to approximally 4.2cm max dimension in the mid region and tapers down to 2cm just proximal to bifurcation. The largest aortic diameter has decreased compared to prior exam. Previous diameter measurement was 4.4 cm obtained on 07/2024.  *See table(s) above for measurements and observations.  Electronically signed by Selinda Gu MD on 11/06/2024 at 8:01:43 AM.    Final    VAS US  ABI WITH/WO TBI Result Date: 11/06/2024  LOWER EXTREMITY DOPPLER STUDY Patient Name:  Bryan Welch  Date of Exam:   11/05/2024 Medical Rec #: [id number]          Accession #:    [id number] Date of Birth: [birth year]          Patient Gender: M Patient Age:   29 years Exam Location:  [facility] Procedure:      VAS US  ABI WITH/WO TBI Referring Phys: SELINDA GU --------------------------------------------------------------------------------  Indications: Claudication, and peripheral artery disease. High Risk  Hypertension, hyperlipidemia, prior MI, coronary artery Factors:          disease. Other Factors:  Patient reports bilateral lower extremity numbness.  Vascular Interventions: 08/09/2023 Thrombectomy, PTA and lysis SFA to peroneal                         08/10/2023 SFA stent and tib per PTA. Comparison Study: 07/2024 Performing Technologist: Jerel Croak RVT  Examination Guidelines: A complete evaluation includes at minimum, Doppler waveform signals and systolic blood pressure reading at the level of bilateral brachial, anterior tibial, and posterior tibial arteries, when vessel segments are accessible. Bilateral testing is considered an integral part of a complete examination. Photoelectric Plethysmograph (PPG) waveforms and toe systolic pressure readings are included as required and additional duplex testing as needed. Limited examinations for reoccurring indications may be performed as noted.  ABI Findings: +---------+------------------+-----+----------+--------+ Right    Rt Pressure (mmHg)IndexWaveform  Comment  +---------+------------------+-----+----------+--------+ Brachial 173                                       +---------+------------------+-----+----------+--------+ PTA      91                0.53 monophasic         +---------+------------------+-----+----------+--------+ DP       97                0.56 monophasic         +---------+------------------+-----+----------+--------+ Great Toe27                0.16 Abnormal           +---------+------------------+-----+----------+--------+ +---------+------------------+-----+----------+-------+ Left     Lt Pressure (mmHg)IndexWaveform  Comment +---------+------------------+-----+----------+-------+ Brachial 170                                      +---------+------------------+-----+----------+-------+ PTA      151               0.87 monophasic        +---------+------------------+-----+----------+-------+ DP       119               0.69 monophasic         +---------+------------------+-----+----------+-------+ Great Toe46                0.27 Abnormal          +---------+------------------+-----+----------+-------+ +-------+-----------+-----------+------------+------------+ ABI/TBIToday's ABIToday's TBIPrevious ABIPrevious TBI +-------+-----------+-----------+------------+------------+ Right  .56        .16        .50         .28          +-------+-----------+-----------+------------+------------+ Left   .87        .27        .54         .22          +-------+-----------+-----------+------------+------------+ Left ABIs appear increased. Right TBIs appear decreased.  Summary: Right: Resting right ankle-brachial index indicates moderate right lower extremity arterial disease. The right toe-brachial index is abnormal. PPG tracings appear dampened.  Left: Resting left ankle-brachial index indicates mild left lower extremity arterial disease. The left toe-brachial index is abnormal. PPG tracings appear dampened.  *See table(s) above for measurements and observations.  Electronically signed by Selinda  Dew MD on 11/06/2024 at 8:00:10 AM.    Final       Assessment/Plan 1.  Atherosclerosis obliterans with ulceration  Today the patient has notable rest pain in addition to a slow healing ulceration.  I had a discussion with the patient regarding angiogram today.  He has previously had angiograms in the past.  We have discussed the risk benefits and alternatives and he is agreeable to proceed.  2.  Hypertension Continue with medication as directed by hospitalist.  3.  Foot ulcer Following intervention, podiatry evaluation may be beneficial to provide early evaluation and intervention to remain any worsening of his current wound.  Plan of care discussed with Dr.Dew and he is in agreement with plan noted above.   Family Communication: None at bedside  Total Time:75 minutes I spent 75 minutes in this encounter including personally reviewing  extensive medical records, personally reviewing imaging studies and compared to prior scans, counseling the patient, placing orders, coordinating care and performing appropriate documentation  Thank you for allowing us  to participate in the care of this patient.   Taeveon Keesling E Alexandr Oehler, NP  Vein and Vascular Surgery [phone] (Office Phone) [phone] (Office Fax) [phone] (Pager)  11/20/2024 9:26 PM  Staff may message me via secure chat in Epic  but this may not receive immediate response,  please page for urgent matters!  Dictation software was used to generate the above note. Typos may occur and escape review, as with typed/written notes. Any error is purely unintentional.  Please contact me directly for clarity if needed.      [1]  Social History Tobacco Use   Smoking status: Former    Current packs/day: 0.00    Types: Cigarettes    Quit date: 2001    Years since quitting: 25.0   Smokeless tobacco: Never  Vaping Use   Vaping status: Never Used  Substance Use Topics   Alcohol use: Not Currently   Drug use: No  [2]  Allergies Allergen Reactions   Codeine Itching   "

## 2024-11-20 NOTE — Consult Note (Signed)
 " [facility] Vascular Consult Note  MRN : [id number]  Bryan Welch is a 89 y.o. (09/17/1936) male who presents with chief complaint of  Chief Complaint  Patient presents with   Foot Pain  .   Consulting Physician:Prince Dorinda, MD Reason for consult: Rest pain History of Present Illness: Bryan Welch is an 89 year old male who is well-known to the vascular service.  Has a previous medical history of peripheral arterial disease with multiple bilateral lower extremity angiogram and stenting.  His most recent was a right lower extremity intervention in 2018.  He also followed the patient for an abdominal aortic aneurysm.  He has a previous medical history of chronic kidney disease, coronary artery disease, carotid artery stenosis and GI bleed.  He was most recently seen by us  in November 05, 2024.  At that time he did have notably reduced ABIs but he was asymptomatic at that time.  He notes that about a week or so after being seen in our office he began to experience a stabbing throbbing sensation in his toe.  He also noted that the wound has been there for approximately the same amount of time as well.  His most recent study showed a right leg ABI 0.56.  Current Facility-Administered Medications  Medication Dose Route Frequency Provider Last Rate Last Admin   0.9 %  sodium chloride  infusion   Intravenous Continuous Dew, Jason S, MD 20 mL/hr at 11/20/24 2021 New Bag at 11/20/24 2021   acetaminophen  (TYLENOL ) tablet 650 mg  650 mg Oral Q6H PRN Laurita Cort DASEN, MD   650 mg at 11/20/24 9053   Or   acetaminophen  (TYLENOL ) suppository 650 mg  650 mg Rectal Q6H PRN Laurita Cort T, MD       ALPRAZolam  (XANAX ) tablet 0.25 mg  0.25 mg Oral QHS Laurita Cort T, MD   0.25 mg at 11/19/24 2135   amLODipine  (NORVASC ) tablet 10 mg  10 mg Oral Daily Zhang, Ping T, MD   10 mg at 11/20/24 0946   aspirin  EC tablet 81 mg  81 mg Oral Daily Laurita Cort T, MD   81 mg at 11/20/24 0946    atorvastatin  (LIPITOR) tablet 80 mg  80 mg Oral Daily Djan, Prince T, MD       ceFAZolin  (ANCEF ) IVPB 2g/100 mL premix  2 g Intravenous 30 min Pre-Op Dew, Jason S, MD       clopidogrel  (PLAVIX ) tablet 75 mg  75 mg Oral Daily Zhang, Ping T, MD       gabapentin  (NEURONTIN ) capsule 300 mg  300 mg Oral BID Laurita Cort T, MD   300 mg at 11/20/24 0946   heparin  ADULT infusion 100 units/mL (25000 units/250mL)  950 Units/hr Intravenous Continuous Viviana Leonor BROCKS, [place] 9.5 mL/hr at 11/20/24 1743 950 Units/hr at 11/20/24 1743   hydrALAZINE  (APRESOLINE ) injection 5 mg  5 mg Intravenous Q6H PRN Laurita Cort T, MD       HYDROmorphone  (DILAUDID ) tablet 1 mg  1 mg Oral Q4H PRN Laurita Cort T, MD   1 mg at 11/20/24 1213   ondansetron  (ZOFRAN ) tablet 4 mg  4 mg Oral Q6H PRN Laurita Cort DASEN, MD       Or   ondansetron  (ZOFRAN ) injection 4 mg  4 mg Intravenous Q6H PRN Laurita Cort DASEN, MD       Oral care mouth rinse  15 mL Mouth Rinse PRN Laurita Cort DASEN, MD  oxyCODONE  (Oxy IR/ROXICODONE ) immediate release tablet 10 mg  10 mg Oral Q6H PRN Dorinda Drue DASEN, MD   10 mg at 11/20/24 1738   pantoprazole  (PROTONIX ) EC tablet 40 mg  40 mg Oral Daily Laurita Manor T, MD   40 mg at 11/20/24 0946   senna-docusate (Senokot-S) tablet 1 tablet  1 tablet Oral QHS PRN Laurita Manor DASEN, MD       tamsulosin  (FLOMAX ) capsule 0.4 mg  0.4 mg Oral Daily Laurita Manor T, MD   0.4 mg at 11/20/24 9053   traZODone  (DESYREL ) tablet 25 mg  25 mg Oral QHS PRN Laurita Manor DASEN, MD        Past Medical History:  Diagnosis Date   AAA (abdominal aortic aneurysm) without rupture 04/05/2017   Anemia    Anxiety    a.) takes BZO (alprazolam ) as needed   Arthritis    Atherosclerosis of native arteries of extremity with intermittent claudication 08/10/2021   4 in legs   Bilateral cataracts    BPH (benign prostatic hyperplasia)    Carotid arterial disease    a.) s/p RICA stent placement   Cholelithiasis    CKD (chronic kidney disease), stage III (HCC)     right kidney doesn't work   Colon polyps    COPD (chronic obstructive pulmonary disease) (HCC)    Coronary artery disease    Erectile dysfunction    GERD (gastroesophageal reflux disease)    History of hiatal hernia    Hyperlipidemia    Hypertension    Hypokalemia 05/08/2023   Long-term use of aspirin  therapy    Nephrolithiasis    Olecranon bursitis, right elbow 05/25/2020   On long term clopidogrel  therapy    Penetrating atherosclerotic ulcer of aorta 03/22/2023   Peripheral vascular disease 04/05/2017   Psoriasis    Sigmoid diverticulosis    Type 2 MI (myocardial infarction) in setting of anemia 10/25/2023   Upper gastrointestinal hemorrhage due to angiodysplasia 05/10/2023   Wears dentures    full upper and lower    Past Surgical History:  Procedure Laterality Date   AMPUTATION TOE Right 09/16/2021   Procedure: AMPUTATION TOE;  Surgeon: Lennie Barter, DPM;  Location: [facility];  Service: Podiatry;  Laterality: Right;   BACK SURGERY     CAROTID STENT Right    CATARACT EXTRACTION W/PHACO Left 06/17/2019   Procedure: CATARACT EXTRACTION PHACO AND INTRAOCULAR LENS PLACEMENT (IOC) LEFT;  Surgeon: Myrna Adine Anes, MD;  Location: [facility];  Service: Ophthalmology;  Laterality: Left;   CATARACT EXTRACTION W/PHACO Right 07/08/2019   Procedure: CATARACT EXTRACTION PHACO AND INTRAOCULAR LENS PLACEMENT (IOC) right  00:39.2  10.8%  5.02  ;  Surgeon: Myrna Adine Anes, MD;  Location: [facility];  Service: Ophthalmology;  Laterality: Right;   COLONOSCOPY WITH PROPOFOL  N/A 04/13/2023   Procedure: COLONOSCOPY WITH PROPOFOL ;  Surgeon: Onita Elspeth Sharper, DO;  Location: [facility] ENDOSCOPY;  Service: Gastroenterology;  Laterality: N/A;   ENTEROSCOPY N/A 05/10/2023   Procedure: ENTEROSCOPY;  Surgeon: Jinny Carmine, MD;  Location: [facility] ENDOSCOPY;  Service: Endoscopy;  Laterality: N/A;   ENTEROSCOPY N/A 10/27/2023   Procedure: ENTEROSCOPY;  Surgeon: Unk Corinn Skiff, MD;  Location: [facility] ENDOSCOPY;  Service: Gastroenterology;  Laterality: N/A;   ESOPHAGOGASTRODUODENOSCOPY (EGD) WITH PROPOFOL  N/A 04/13/2023   Procedure: ESOPHAGOGASTRODUODENOSCOPY (EGD) WITH PROPOFOL ;  Surgeon: Onita Elspeth Sharper, DO;  Location: [facility] ENDOSCOPY;  Service: Gastroenterology;  Laterality: N/A;   EYE SURGERY     GIVENS CAPSULE STUDY N/A 05/09/2023  Procedure: GIVENS CAPSULE STUDY;  Surgeon: Jinny Carmine, MD;  Location: [facility] ENDOSCOPY;  Service: Endoscopy;  Laterality: N/A;   GIVENS CAPSULE STUDY  10/27/2023   Procedure: GIVENS CAPSULE STUDY;  Surgeon: Unk Corinn Skiff, MD;  Location: [facility] ENDOSCOPY;  Service: Gastroenterology;;   HOT HEMOSTASIS  05/10/2023   Procedure: HOT HEMOSTASIS (ARGON PLASMA COAGULATION/BICAP);  Surgeon: Jinny Carmine, MD;  Location: [facility] ENDOSCOPY;  Service: Endoscopy;;   LEG SURGERY Bilateral    stent placement   LOWER EXTREMITY ANGIOGRAPHY Right 08/19/2021   Procedure: LOWER EXTREMITY ANGIOGRAPHY;  Surgeon: Marea Selinda RAMAN, MD;  Location: [facility] INVASIVE CV LAB;  Service: Cardiovascular;  Laterality: Right;   LOWER EXTREMITY ANGIOGRAPHY Right 08/09/2023   Procedure: Lower Extremity Angiography;  Surgeon: Marea Selinda RAMAN, MD;  Location: [facility] IN[facility] CV LAB;  Service: Cardiovascular;  Laterality: Right;   LOWER EXTREMITY ANGIOGRAPHY Right 08/10/2023   Procedure: Lower Extremity Angiography;  Surgeon: Marea Selinda RAMAN, MD;  Location: [facility] IN[facility] LAB;  Service: Cardiovascular;  Laterality: Right;   OLECRANON BURSECTOMY Right 09/17/2024   Procedure: JENELLE SPAIN;  Surgeon: Ezra Jackquline RAMAN, MD;  Location: [facility] ORS;  Service: Orthopedics;  Laterality: Right;   PERIPHERAL VASCULAR BALLOON ANGIOPLASTY Right 09/21/2017   Procedure: PERIPHERAL VASCULAR BALLOON ANGIOPLASTY;  Surgeon: Marea Selinda RAMAN, MD;  Location: [facility] IN[facility];  Service: Cardiovascular;  Laterality: Right;   REVERSE SHOULDER ARTHROPLASTY Right 11/26/2019   Procedure: REVERSE SHOULDER  ARTHROPLASTY;  Surgeon: Edie Norleen PARAS, MD;  Location: [facility] ORS;  Service: Orthopedics;  Laterality: Right;   SUBMUCOSAL TATTOO INJECTION  10/27/2023   Procedure: SUBMUCOSAL TATTOO INJECTION;  Surgeon: Unk Corinn Skiff, MD;  Location: [facility] ENDOSCOPY;  Service: Gastroenterology;;    Social History Social History[1]  Family History Family History  Problem Relation Age of Onset   Bladder Cancer Mother    Prostate cancer Father    Prostate cancer Brother    Diabetes Maternal Grandfather     Allergies[2]   REVIEW OF SYSTEMS (Negative unless checked)  Constitutional: [] Weight loss  [] Fever  [] Chills Cardiac: [] Chest pain   [] Chest pressure   [] Palpitations   [] Shortness of breath when laying flat   [] Shortness of breath at rest   [] Shortness of breath with exertion. Vascular:  [] Pain in legs with walking   [] Pain in legs at rest   [] Pain in legs when laying flat   [] Claudication   [x] Pain in feet when walking  [x] Pain in feet at rest  [x] Pain in feet when laying flat   [] History of DVT   [] Phlebitis   [] Swelling in legs   [] Varicose veins   [] Non-healing ulcers Pulmonary:   [] Uses home oxygen   [] Productive cough   [] Hemoptysis   [] Wheeze  [] COPD   [] Asthma Neurologic:  [] Dizziness  [] Blackouts   [] Seizures   [] History of stroke   [] History of TIA  [] Aphasia   [] Temporary blindness   [] Dysphagia   [] Weakness or numbness in arms   [] Weakness or numbness in legs Musculoskeletal:  [] Arthritis   [] Joint swelling   [] Joint pain   [] Low back pain Hematologic:  [] Easy bruising  [] Easy bleeding   [] Hypercoagulable state   [] Anemic  [] Hepatitis Gastrointestinal:  [] Blood in stool   [] Vomiting blood  [] Gastroesophageal reflux/heartburn   [] Difficulty swallowing. Genitourinary:  [] Chronic kidney disease   [] Difficult urination  [] Frequent urination  [] Burning with urination   [] Blood in urine Skin:  [] Rashes   [x] Ulcers   [] Wounds Psychological:  [] History of anxiety   []  History of  major  depression.  Physical Examination  Vitals:   11/20/24 0425 11/20/24 0838 11/20/24 1512 11/20/24 2031  BP: (!) 101/41 119/61 (!) 120/55 (!) 128/54  Pulse: 74 77 73 76  Resp: 14 18 16 16   Temp: 97.7 F (36.5 C) 97.6 F (36.4 C) 97.6 F (36.4 C) 98.2 F (36.8 C)  TempSrc: Oral Oral  Oral  SpO2: 96% 95% 96% 97%  Weight:      Height:       Body mass index is 21.95 kg/m. Gen:  WD/WN, NAD Head: [place]/AT, No temporalis wasting. Prominent temp pulse not noted. Ear/Nose/Throat: Hearing grossly intact, nares w/o erythema or drainage, oropharynx w/o Erythema/Exudate Eyes: Sclera non-icteric, conjunctiva clear Neck: Trachea midline.  No JVD.  Pulmonary:  Good air movement, respirations not labored, equal bilaterally.  Cardiac: RRR, normal S1, S2. Vascular:  Feet are warm bilaterally but pulses are nonpalpable bilaterally  Gastrointestinal: soft, non-tender/non-distended. No guarding/reflex.  Musculoskeletal: M/S 5/5 throughout.  Extremities without ischemic changes.  No deformity or atrophy. No edema. Neurologic: Sensation grossly intact in extremities.  Symmetrical.  Speech is fluent. Motor exam as listed above. Psychiatric: Judgment intact, Mood & affect appropriate for pt's clinical situation. Dermatologic: Shallow ulcer on the lateral side of his fourth toe.  Small area site second toe Lymph : No Cervical, Axillary, or Inguinal lymphadenopathy.    CBC Lab Results  Component Value Date   WBC 7.9 11/20/2024   HGB 13.5 11/20/2024   HCT 40.4 11/20/2024   MCV 87.8 11/20/2024   PLT 202 11/20/2024    BMET    Component Value Date/Time   NA 141 11/19/2024 1147   NA 141 03/22/2013 0237   K 3.7 11/19/2024 1147   K 4.3 03/22/2013 0237   CL 105 11/19/2024 1147   CL 110 (H) 03/22/2013 0237   CO2 21 (L) 11/19/2024 1147   CO2 24 03/22/2013 0237   GLUCOSE 140 (H) 11/19/2024 1147   GLUCOSE 101 (H) 03/22/2013 0237   BUN 31 (H) 11/19/2024 1147   BUN 19 (H) 03/22/2013 0237    CREATININE 1.64 (H) 11/19/2024 1147   CREATININE 1.72 (H) 09/23/2024 0946   CREATININE 1.53 (H) 03/22/2013 0237   CALCIUM  8.6 (L) 11/19/2024 1147   CALCIUM  8.1 (L) 03/22/2013 0237   GFRNONAA 40 (L) 11/19/2024 1147   GFRNONAA 38 (L) 09/23/2024 0946   GFRNONAA 44 (L) 03/22/2013 0237   GFRAA 43 (L) 11/21/2019 0911   GFRAA 51 (L) 03/22/2013 0237   Estimated Creatinine Clearance: 28.2 mL/min (A) (by C-G formula based on SCr of 1.64 mg/dL (H)).  COAG Lab Results  Component Value Date   INR 1.2 11/19/2024   INR 2.0 (H) 10/25/2023   INR 1.2 06/08/2023    Radiology DG Foot Complete Right Result Date: 11/19/2024 CLINICAL DATA:  Right foot pain. EXAM: RIGHT FOOT COMPLETE - 3+ VIEW COMPARISON:  None Available. 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 Moderate L2 valgus deformity. Mild-to-moderate degenerative changes of the first MTP joint and first interphalangeal joint. No acute fracture or dislocation chronic minimal degenerate change over the midfoot. IMPRESSION: 1. No acute findings. 2. Degenerative changes as described. 3. 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 Electronically Signed   By: Toribio Agreste M.D.   On: 11/19/2024 12:14   VAS US  CAROTID Result Date: 11/06/2024 Carotid Arterial Duplex Study Patient Name:  Bryan Welch  Date of Exam:   11/05/2024 Medical Rec #: [id number]  Accession #:    [id number] Date of Birth: Apr 04, 1936          Patient Gender: M Patient Age:   80 years Exam Location:  [facility] Procedure:      VAS US  CAROTID Referring Phys: SELINDA GU --------------------------------------------------------------------------------  Performing Technologist: Jerel Croak RVT  Examination Guidelines: A complete evaluation includes B-mode imaging, spectral Doppler, color Doppler, and power Doppler as needed of all accessible portions of each vessel. Bilateral testing is considered an  integral part of a complete examination. Limited examinations for reoccurring indications may be performed as noted.  Right Carotid Findings: +----------+--------+--------+--------+----------------------+--------+           PSV cm/sEDV cm/sStenosisPlaque Description    Comments +----------+--------+--------+--------+----------------------+--------+ CCA Prox  96      10                                             +----------+--------+--------+--------+----------------------+--------+ CCA Mid   118     21      <50%    calcific and irregular         +----------+--------+--------+--------+----------------------+--------+ CCA Distal91      5                                              +----------+--------+--------+--------+----------------------+--------+ ICA Prox  66      16                                             +----------+--------+--------+--------+----------------------+--------+ ICA Mid   70      11                                             +----------+--------+--------+--------+----------------------+--------+ ICA Distal76      10      1-39%                                  +----------+--------+--------+--------+----------------------+--------+ ECA       77      1                                              +----------+--------+--------+--------+----------------------+--------+ +----------+--------+-------+----------------+-------------------+           PSV cm/sEDV cmsDescribe        Arm Pressure (mmHG) +----------+--------+-------+----------------+-------------------+ Subclavian105            Multiphasic, WNL                    +----------+--------+-------+----------------+-------------------+ +---------+--------+--+--------+-+---------+ VertebralPSV cm/s23EDV cm/s4Antegrade +---------+--------+--+--------+-+---------+  Left Carotid Findings: +----------+--------+--------+--------+----------------------+--------+           PSV  cm/sEDV cm/sStenosisPlaque Description    Comments +----------+--------+--------+--------+----------------------+--------+ CCA Prox  112     26                                             +----------+--------+--------+--------+----------------------+--------+  CCA Mid   113     20                                             +----------+--------+--------+--------+----------------------+--------+ CCA Distal174     29      >50%    calcific and irregular         +----------+--------+--------+--------+----------------------+--------+ ICA Prox  175     29      40-59%  calcific and irregular         +----------+--------+--------+--------+----------------------+--------+ ICA Mid   121     20                                             +----------+--------+--------+--------+----------------------+--------+ ICA Distal81      23                                             +----------+--------+--------+--------+----------------------+--------+ ECA       78      3                                              +----------+--------+--------+--------+----------------------+--------+ +----------+--------+--------+----------------+-------------------+           PSV cm/sEDV cm/sDescribe        Arm Pressure (mmHG) +----------+--------+--------+----------------+-------------------+ [id number]              Multiphasic, WNL                    +----------+--------+--------+----------------+-------------------+ +---------+--------+--+--------+--+---------+ VertebralPSV cm/s53EDV cm/s13Antegrade +---------+--------+--+--------+--+---------+   Summary: Right Carotid: Velocities in the right ICA are consistent with a 1-39% stenosis.                Non-hemodynamically significant plaque <50% noted in the CCA. The                ECA appears <50% stenosed. Left Carotid: Velocities in the left ICA are consistent with a 40-59% stenosis.               Hemodynamically  significant plaque >50% visualized in the CCA. The               ECA appears <50% stenosed. Vertebrals:  Bilateral vertebral arteries demonstrate antegrade flow. Subclavians: Normal flow hemodynamics were seen in bilateral subclavian              arteries. *See table(s) above for measurements and observations.  Electronically signed by Selinda Gu MD on 11/06/2024 at 8:02:10 AM.    Final    VAS US  AAA DUPLEX Result Date: 11/06/2024 ABDOMINAL AORTA STUDY Patient Name:  Bryan Welch  Date of Exam:   11/05/2024 Medical Rec #: [id number]          Accession #:    [id number] Date of Birth: [birth year]          Patient Gender: M Patient Age:   68 years Exam Location:  [facility] Procedure:      VAS US  AAA DUPLEX Referring Phys: JASON  DEW --------------------------------------------------------------------------------  Indications: Follow up exam for known AAA. Risk Factors: Hypertension, hyperlipidemia, prior MI, coronary artery disease. Vascular Interventions: 08/09/2023 Thrombectomy, PTA and lysis SFA to peroneal                         08/10/2023 SFA stent and tib per PTA.  Comparison Study: 07/2024 Performing Technologist: Jerel Croak RVT  Examination Guidelines: A complete evaluation includes B-mode imaging, spectral Doppler, color Doppler, and power Doppler as needed of all accessible portions of each vessel. Bilateral testing is considered an integral part of a complete examination. Limited examinations for reoccurring indications may be performed as noted.  Abdominal Aorta Findings: +-----------+-------+----------+----------+--------+--------+--------+ Location   AP (cm)Trans (cm)PSV (cm/s)WaveformThrombusComments +-----------+-------+----------+----------+--------+--------+--------+ Proximal   2.25   2.22      58                                 +-----------+-------+----------+----------+--------+--------+--------+ Mid        4.06   4.14      47                                  +-----------+-------+----------+----------+--------+--------+--------+ Distal     1.92   2.00      43                                 +-----------+-------+----------+----------+--------+--------+--------+ RT CIA Prox0.9    1.0       77                                 +-----------+-------+----------+----------+--------+--------+--------+ LT CIA Prox0.7    0.9       128                                +-----------+-------+----------+----------+--------+--------+--------+  Summary: Abdominal Aorta: There is evidence of abnormal dilatation of the mid Abdominal aorta. The largest aortic measurement is 4.1 cm. Tortuous abdominal aorta which causes varied measurements from multiple previous ultrasounds. From a longitudinal duplex view the fusiform AAA, as seen by previous CTA is infrarenal and in the mid abdominal aorta. Distal transthoracic/proximal abdominal aorta is of normal caliber then dilates to approximally 4.2cm max dimension in the mid region and tapers down to 2cm just proximal to bifurcation. The largest aortic diameter has decreased compared to prior exam. Previous diameter measurement was 4.4 cm obtained on 07/2024.  *See table(s) above for measurements and observations.  Electronically signed by Selinda Gu MD on 11/06/2024 at 8:01:43 AM.    Final    VAS US  ABI WITH/WO TBI Result Date: 11/06/2024  LOWER EXTREMITY DOPPLER STUDY Patient Name:  Bryan Welch  Date of Exam:   11/05/2024 Medical Rec #: [id number]          Accession #:    [id number] Date of Birth: [birth year]          Patient Gender: M Patient Age:   29 years Exam Location:  [facility] Procedure:      VAS US  ABI WITH/WO TBI Referring Phys: SELINDA GU --------------------------------------------------------------------------------  Indications: Claudication, and peripheral artery disease. High Risk  Hypertension, hyperlipidemia, prior MI, coronary artery Factors:          disease. Other Factors:  Patient reports bilateral lower extremity numbness.  Vascular Interventions: 08/09/2023 Thrombectomy, PTA and lysis SFA to peroneal                         08/10/2023 SFA stent and tib per PTA. Comparison Study: 07/2024 Performing Technologist: Jerel Croak RVT  Examination Guidelines: A complete evaluation includes at minimum, Doppler waveform signals and systolic blood pressure reading at the level of bilateral brachial, anterior tibial, and posterior tibial arteries, when vessel segments are accessible. Bilateral testing is considered an integral part of a complete examination. Photoelectric Plethysmograph (PPG) waveforms and toe systolic pressure readings are included as required and additional duplex testing as needed. Limited examinations for reoccurring indications may be performed as noted.  ABI Findings: +---------+------------------+-----+----------+--------+ Right    Rt Pressure (mmHg)IndexWaveform  Comment  +---------+------------------+-----+----------+--------+ Brachial 173                                       +---------+------------------+-----+----------+--------+ PTA      91                0.53 monophasic         +---------+------------------+-----+----------+--------+ DP       97                0.56 monophasic         +---------+------------------+-----+----------+--------+ Great Toe27                0.16 Abnormal           +---------+------------------+-----+----------+--------+ +---------+------------------+-----+----------+-------+ Left     Lt Pressure (mmHg)IndexWaveform  Comment +---------+------------------+-----+----------+-------+ Brachial 170                                      +---------+------------------+-----+----------+-------+ PTA      151               0.87 monophasic        +---------+------------------+-----+----------+-------+ DP       119               0.69 monophasic         +---------+------------------+-----+----------+-------+ Great Toe46                0.27 Abnormal          +---------+------------------+-----+----------+-------+ +-------+-----------+-----------+------------+------------+ ABI/TBIToday's ABIToday's TBIPrevious ABIPrevious TBI +-------+-----------+-----------+------------+------------+ Right  .56        .16        .50         .28          +-------+-----------+-----------+------------+------------+ Left   .87        .27        .54         .22          +-------+-----------+-----------+------------+------------+ Left ABIs appear increased. Right TBIs appear decreased.  Summary: Right: Resting right ankle-brachial index indicates moderate right lower extremity arterial disease. The right toe-brachial index is abnormal. PPG tracings appear dampened.  Left: Resting left ankle-brachial index indicates mild left lower extremity arterial disease. The left toe-brachial index is abnormal. PPG tracings appear dampened.  *See table(s) above for measurements and observations.  Electronically signed by Selinda  Dew MD on 11/06/2024 at 8:00:10 AM.    Final       Assessment/Plan 1.  Atherosclerosis obliterans with ulceration  Today the patient has notable rest pain in addition to a slow healing ulceration.  I had a discussion with the patient regarding angiogram today.  He has previously had angiograms in the past.  We have discussed the risk benefits and alternatives and he is agreeable to proceed.  2.  Hypertension Continue with medication as directed by hospitalist.  3.  Foot ulcer Following intervention, podiatry evaluation may be beneficial to provide early evaluation and intervention to remain any worsening of his current wound.  Plan of care discussed with Dr.Dew and he is in agreement with plan noted above.   Family Communication: None at bedside  Total Time:75 minutes I spent 75 minutes in this encounter including personally reviewing  extensive medical records, personally reviewing imaging studies and compared to prior scans, counseling the patient, placing orders, coordinating care and performing appropriate documentation  Thank you for allowing us  to participate in the care of this patient.   Taeveon Keesling E Alexandr Oehler, NP  Vein and Vascular Surgery [phone] (Office Phone) [phone] (Office Fax) [phone] (Pager)  11/20/2024 9:26 PM  Staff may message me via secure chat in Epic  but this may not receive immediate response,  please page for urgent matters!  Dictation software was used to generate the above note. Typos may occur and escape review, as with typed/written notes. Any error is purely unintentional.  Please contact me directly for clarity if needed.      [1]  Social History Tobacco Use   Smoking status: Former    Current packs/day: 0.00    Types: Cigarettes    Quit date: 2001    Years since quitting: 25.0   Smokeless tobacco: Never  Vaping Use   Vaping status: Never Used  Substance Use Topics   Alcohol use: Not Currently   Drug use: No  [2]  Allergies Allergen Reactions   Codeine Itching   "

## 2024-11-20 NOTE — Consult Note (Signed)
 PHARMACY - ANTICOAGULATION CONSULT NOTE  Pharmacy Consult for heparin  dosing Indication: PAD  Allergies[1]  Patient Measurements: Height: 5' 6 (167.6 cm) Weight: 61.7 kg (136 lb) IBW/kg (Calculated) : 63.8 HEPARIN  DW (KG): 61.7  Vital Signs: Temp: 97.6 F (36.4 C) (01/14 0838) Temp Source: Oral (01/14 0838) BP: 119/61 (01/14 0838) Pulse Rate: 77 (01/14 0838)  Labs: Recent Labs    11/19/24 1147 11/19/24 1915 11/20/24 0345 11/20/24 1158  HGB 14.7  --  13.5  --   HCT 44.1  --  40.4  --   PLT 213  --  202  --   APTT  --  >200*  --   --   LABPROT  --  15.4*  --   --   INR  --  1.2  --   --   HEPARINUNFRC  --   --  0.56 0.67  CREATININE 1.64*  --   --   --     Estimated Creatinine Clearance: 28.2 mL/min (A) (by C-G formula based on SCr of 1.64 mg/dL (H)).   Medical History: Past Medical History:  Diagnosis Date   AAA (abdominal aortic aneurysm) without rupture 04/05/2017   Anemia    Anxiety    a.) takes BZO (alprazolam ) as needed   Arthritis    Atherosclerosis of native arteries of extremity with intermittent claudication 08/10/2021   4 in legs   Bilateral cataracts    BPH (benign prostatic hyperplasia)    Carotid arterial disease    a.) s/p RICA stent placement   Cholelithiasis    CKD (chronic kidney disease), stage III (HCC)    right kidney doesn't work   Colon polyps    COPD (chronic obstructive pulmonary disease) (HCC)    Coronary artery disease    Erectile dysfunction    GERD (gastroesophageal reflux disease)    History of hiatal hernia    Hyperlipidemia    Hypertension    Hypokalemia 05/08/2023   Long-term use of aspirin  therapy    Nephrolithiasis    Olecranon bursitis, right elbow 05/25/2020   On long term clopidogrel  therapy    Penetrating atherosclerotic ulcer of aorta 03/22/2023   Peripheral vascular disease 04/05/2017   Psoriasis    Sigmoid diverticulosis    Type 2 MI (myocardial infarction) in setting of anemia 10/25/2023   Upper  gastrointestinal hemorrhage due to angiodysplasia 05/10/2023   Wears dentures    full upper and lower    Medications:  No PTA anticoagulation  Assessment: Bryan Welch is an 37yoM presenting with concerns for right toe pain and PAD. Past medical history is notable for PVD status post bilateral lower extremity angioplasty and stenting, and RLE revascularization in 2018, CKD stage IIIb, chronic iron  deficiency anemia secondary to GI bleed, HTN, and AAA. Denies diabetes. Also had a toe on the right food removed a couple years ago due to infection.  On PTA clopidogrel  and low-dose aspirin . At rest, patient endorses right toe pain. Vascular surgery has been consulted and will see patient tomorrow. Podiatry also consulted. Per provider, to continue aspirin  and hold off on clopidogrel  today.   Glucose 140 on admission, and unknown last A1c. Vascular US  ABI, carotid, and AAA ordered. No acute findings in right foot xray.  Date Time HL Rate/Comment 01/14 0345 0.56 Therapeutic x1 01/14 1158 0.67 Therapeutic x2  Goal of Therapy:  Heparin  level 0.3-0.7 units/ml Monitor platelets by anticoagulation protocol: Yes   Plan:  Continue heparin  infusion at 950 units/hr Check heparin  with tomorrow AM  labs Continue to monitor H&H and platelets  Thank you for involving pharmacy in this patient's care.   Will M. Lenon, PharmD, BCPS Clinical Pharmacist 11/20/2024 1:24 PM     [1]  Allergies Allergen Reactions   Codeine Itching

## 2024-11-20 NOTE — Progress Notes (Signed)
 " Progress Note   [name] FMW:[id number] DOB: 11/02/1936 DOA: 11/19/2024     1 DOS: the patient was seen and examined on 11/20/2024   Brief hospital course: From HPI Bryan Welch is a 89 y.o. male with medical history significant of PVD status post bilateral lower extremity angioplasty and stenting, and RLE revascularization in 2018, CKD stage IIIb, CAD/NSTEMI, chronic iron  deficiency anemia secondary to GI bleed, HTN, AAA, brought in by family member for evaluation of worsening of pain of right toe.   Symptoms started about 2 weeks ago when patient started to have resting pain of right 4th toe, along with claudication and gradual getting worse.  Worsening at night, he describes the pain as  pulsatile.  Eventually he went to a walk-in clinic on January 8, he was diagnosed with infected fourth toe ulcer and started take doxycycline.  He completed 4 days course of doxycycline but has not feeling better.  This morning, daughter drove her to the ED.  Denies any fever chills no chest pain or shortness of breath. ED Course: 98.4 temperature, nontachycardic blood pressure 170/60 O2 saturation 98% on room air.  X-ray of right foot did not show significant bony abnormalities.  Blood work showed WBC 8.9 hemoglobin 14.7 BUN 31 creatinine 1.6 compared to baseline 1.7 glucose 140 bicarb 21.   Patient was given 1 dose of Ancef  and IV Dilaudid  for pain.  ET found diminished pedal pulses and patient was started on heparin  drip and vascular surgery and podiatry were consulted.      Assessment and Plan:   Right fourth toe pressure ulcer History of PVD Resting pain Case discussed with vascular surgeon and pain plan for procedure tomorrow  Continue as needed pain medication  Podiatry was consulted  Continue aspirin  and statin therapy CKD stage IIIb - Euvolemic and creatinine level stable   Peripheral neuropathy - Continue gabapentin    HTN - Discontinue hydrochlorothiazide  for  patient advanced kidney disease Changed to amlodipine    DVT prophylaxis: Heparin  drip  Code Status: Full code  Family Communication: Daughter at bedside Pending medical stabilization Consults called: Vascular surgeon Dr. Marea And podiatry Dr. Tanda  Subjective:  Seen and examined at bedside this morning Admits to improvement in foot pain Denies nausea vomiting chest pain or cough  Physical Exam:  General: Not in acute distress Respiratory: clear to auscultation bilaterally, no wheezing, no crackles. Normal respiratory effort. No accessory muscle use.  Cardiovascular: Regular rate and rhythm, no murmurs / rubs / gallops. No extremity edema.  Diminished pedal pulses bilaterally. No carotid bruits.  Abdomen: no tenderness, no masses palpated. No hepatosplenomegaly. Bowel sounds positive.  Musculoskeletal: no clubbing / cyanosis. No joint deformity upper and lower extremities. Good ROM, no contractures. Normal muscle tone.  Skin: Shallow ulcer of lateral side of fourth toe, with a clean base, no significant rash discharge or swelling surrounding the ulcer Neurologic: CN 2-12 grossly intact. Sensation intact, DTR normal. Strength 5/5 in all 4.  Psychiatric: Normal judgment and insight. Alert and oriented x 3. Normal mood.  Vitals:   11/19/24 2048 11/20/24 0425 11/20/24 0838 11/20/24 1512  BP: (!) 144/51 (!) 101/41 119/61 (!) 120/55  Pulse: 78 74 77 73  Resp: 18 14 18 16   Temp: 98.1 F (36.7 C) 97.7 F (36.5 C) 97.6 F (36.4 C) 97.6 F (36.4 C)  TempSrc:  Oral Oral   SpO2: 100% 96% 95% 96%  Weight:      Height:  Data Reviewed:     Latest Ref Rng & Units 11/20/2024    3:45 AM 11/19/2024   11:47 AM 09/23/2024    9:45 AM  CBC  WBC 4.0 - 10.5 K/uL 7.9  8.9  8.0   Hemoglobin 13.0 - 17.0 g/dL 86.4  85.2  84.2   Hematocrit 39.0 - 52.0 % 40.4  44.1  46.8   Platelets 150 - 400 K/uL 202  213  377        Latest Ref Rng & Units 11/19/2024   11:47 AM 09/23/2024    9:46 AM  10/28/2023   12:51 PM  BMP  Glucose 70 - 99 mg/dL 859  861  873   BUN 8 - 23 mg/dL 31  38  31   Creatinine 0.61 - 1.24 mg/dL 8.35  8.27  8.77   Sodium 135 - 145 mmol/L 141  135  135   Potassium 3.5 - 5.1 mmol/L 3.7  3.5  3.9   Chloride 98 - 111 mmol/L 105  95  106   CO2 22 - 32 mmol/L 21  26  22    Calcium  8.9 - 10.3 mg/dL 8.6  9.3  8.2      Author: Drue ONEIDA Potter, MD 11/20/2024 4:46 PM  For on call review [email or web address].  "

## 2024-11-20 NOTE — Consult Note (Signed)
 PODIATRIC SURGERY: CONSULT NOTE  Consulting Physician: Dr. Ernest   Reason for Consult: R 4th toe wound.    HPI: Bryan Welch is a 89 y.o. male medical history significant of PVD status post bilateral lower extremity angioplasty and stenting, and RLE revascularization in 2018, CKD stage IIIb, CAD/NSTEMI, chronic iron  deficiency anemia secondary to GI bleed, HTN, AAA  presenting to the ED for evaluation of wound/ infection concern to the right foot - noted 2 weeks ago with associated pain.   Onset of wound: gradual  Course: worsening pain   Aggravating factors:  Prior Treatment: Wound care center / PCP / [facility]  Prior Wound Care instructions:   Podiatry consulted for evaluation of wound right foot.  Denies: F/C/N/V  SOB/CP/calf pain.    PMHx:  Past Medical History:  Diagnosis Date   AAA (abdominal aortic aneurysm) without rupture 04/05/2017   Anemia    Anxiety    a.) takes BZO (alprazolam ) as needed   Arthritis    Atherosclerosis of native arteries of extremity with intermittent claudication 08/10/2021   4 in legs   Bilateral cataracts    BPH (benign prostatic hyperplasia)    Carotid arterial disease    a.) s/p RICA stent placement   Cholelithiasis    CKD (chronic kidney disease), stage III (HCC)    right kidney doesn't work   Colon polyps    COPD (chronic obstructive pulmonary disease) (HCC)    Coronary artery disease    Erectile dysfunction    GERD (gastroesophageal reflux disease)    History of hiatal hernia    Hyperlipidemia    Hypertension    Hypokalemia 05/08/2023   Long-term use of aspirin  therapy    Nephrolithiasis    Olecranon bursitis, right elbow 05/25/2020   On long term clopidogrel  therapy    Penetrating atherosclerotic ulcer of aorta 03/22/2023   Peripheral vascular disease 04/05/2017   Psoriasis    Sigmoid diverticulosis    Type 2 MI (myocardial infarction) in setting of anemia 10/25/2023   Upper gastrointestinal hemorrhage due to  angiodysplasia 05/10/2023   Wears dentures    full upper and lower    Surgical Hx:  Past Surgical History:  Procedure Laterality Date   AMPUTATION TOE Right 09/16/2021   Procedure: AMPUTATION TOE;  Surgeon: Lennie Barter, DPM;  Location: [facility];  Service: Podiatry;  Laterality: Right;   BACK SURGERY     CAROTID STENT Right    CATARACT EXTRACTION W/PHACO Left 06/17/2019   Procedure: CATARACT EXTRACTION PHACO AND INTRAOCULAR LENS PLACEMENT (IOC) LEFT;  Surgeon: Myrna Adine Anes, MD;  Location: [facility];  Service: Ophthalmology;  Laterality: Left;   CATARACT EXTRACTION W/PHACO Right 07/08/2019   Procedure: CATARACT EXTRACTION PHACO AND INTRAOCULAR LENS PLACEMENT (IOC) right  00:39.2  10.8%  5.02  ;  Surgeon: Myrna Adine Anes, MD;  Location: [facility];  Service: Ophthalmology;  Laterality: Right;   COLONOSCOPY WITH PROPOFOL  N/A 04/13/2023   Procedure: COLONOSCOPY WITH PROPOFOL ;  Surgeon: Onita Elspeth Sharper, DO;  Location: [facility] ENDOSCOPY;  Service: Gastroenterology;  Laterality: N/A;   ENTEROSCOPY N/A 05/10/2023   Procedure: ENTEROSCOPY;  Surgeon: Jinny Carmine, MD;  Location: [facility] ENDOSCOPY;  Service: Endoscopy;  Laterality: N/A;   ENTEROSCOPY N/A 10/27/2023   Procedure: ENTEROSCOPY;  Surgeon: Unk Corinn Skiff, MD;  Location: [facility] ENDOSCOPY;  Service: Gastroenterology;  Laterality: N/A;   ESOPHAGOGASTRODUODENOSCOPY (EGD) WITH PROPOFOL  N/A 04/13/2023   Procedure: ESOPHAGOGASTRODUODENOSCOPY (EGD) WITH PROPOFOL ;  Surgeon: Onita Elspeth Sharper, DO;  Location: [facility]  ENDOSCOPY;  Service: Gastroenterology;  Laterality: N/A;   EYE SURGERY     GIVENS CAPSULE STUDY N/A 05/09/2023   Procedure: GIVENS CAPSULE STUDY;  Surgeon: Jinny Carmine, MD;  Location: [facility] ENDOSCOPY;  Service: Endoscopy;  Laterality: N/A;   GIVENS CAPSULE STUDY  10/27/2023   Procedure: GIVENS CAPSULE STUDY;  Surgeon: Unk Corinn Skiff, MD;  Location: [facility] ENDOSCOPY;  Service: Gastroenterology;;    HOT HEMOSTASIS  05/10/2023   Procedure: HOT HEMOSTASIS (ARGON PLASMA COAGULATION/BICAP);  Surgeon: Jinny Carmine, MD;  Location: [facility] ENDOSCOPY;  Service: Endoscopy;;   LEG SURGERY Bilateral    stent placement   LOWER EXTREMITY ANGIOGRAPHY Right 08/19/2021   Procedure: LOWER EXTREMITY ANGIOGRAPHY;  Surgeon: Marea Selinda RAMAN, MD;  Location: [facility] IN[facility];  Service: Cardiovascular;  Laterality: Right;   LOWER EXTREMITY ANGIOGRAPHY Right 08/09/2023   Procedure: Lower Extremity Angiography;  Surgeon: Marea Selinda RAMAN, MD;  Location: [facility];  Service: Cardiovascular;  Laterality: Right;   LOWER EXTREMITY ANGIOGRAPHY Right 08/10/2023   Procedure: Lower Extremity Angiography;  Surgeon: Marea Selinda RAMAN, MD;  Location: [facility];  Service: Cardiovascular;  Laterality: Right;   OLECRANON BURSECTOMY Right 09/17/2024   Procedure: JENELLE SPAIN;  Surgeon: Ezra Jackquline RAMAN, MD;  Location: [facility] ORS;  Service: Orthopedics;  Laterality: Right;   PERIPHERAL VASCULAR BALLOON ANGIOPLASTY Right 09/21/2017   Procedure: PERIPHERAL VASCULAR BALLOON ANGIOPLASTY;  Surgeon: Marea Selinda RAMAN, MD;  Location: [facility];  Service: Cardiovascular;  Laterality: Right;   REVERSE SHOULDER ARTHROPLASTY Right 11/26/2019   Procedure: REVERSE SHOULDER ARTHROPLASTY;  Surgeon: Edie Norleen PARAS, MD;  Location: [facility] ORS;  Service: Orthopedics;  Laterality: Right;   SUBMUCOSAL TATTOO INJECTION  10/27/2023   Procedure: SUBMUCOSAL TATTOO INJECTION;  Surgeon: Unk Corinn Skiff, MD;  Location: [facility] ENDOSCOPY;  Service: Gastroenterology;;    FHx:  Family History  Problem Relation Age of Onset   Bladder Cancer Mother    Prostate cancer Father    Prostate cancer Brother    Diabetes Maternal Grandfather     Social History:  reports that he quit smoking about 25 years ago. His smoking use included cigarettes. He has never used smokeless tobacco. He reports that he does not currently use alcohol. He reports that  he does not use drugs.  Allergies: Allergies[1]  Medications Prior to Admission  Medication Sig Dispense Refill   acetaminophen  (TYLENOL ) 500 MG tablet Take 1,000 mg by mouth at bedtime.     ALPRAZolam  (XANAX ) 0.25 MG tablet Take 0.25 mg by mouth at bedtime.     aspirin  EC 81 MG tablet Take 81 mg daily by mouth.      Cholecalciferol  25 MCG (1000 UT) capsule Take 2,000 Units by mouth daily.     clopidogrel  (PLAVIX ) 75 MG tablet TAKE 1 TABLET BY MOUTH EVERY DAY 90 tablet 2   doxycycline (VIBRA-TABS) 100 MG tablet Take 100 mg by mouth 2 (two) times daily.     gabapentin  (NEURONTIN ) 300 MG capsule Take 300 mg by mouth 2 (two) times daily.     hydrochlorothiazide  (HYDRODIURIL ) 25 MG tablet Take 1 tablet by mouth daily.     Multiple Vitamins-Minerals (PRESERVISION AREDS 2 PO) Take 1 tablet by mouth 2 (two) times daily.     mupirocin ointment (BACTROBAN) 2 % Apply 1 Application topically 3 (three) times daily. For 7 days     pantoprazole  (PROTONIX ) 40 MG tablet Take 40 mg by mouth daily.     potassium chloride  (KLOR-CON ) 10 MEQ tablet  Take 10 mEq by mouth daily.     pravastatin  (PRAVACHOL ) 20 MG tablet Take 20 mg by mouth at bedtime.     tamsulosin  (FLOMAX ) 0.4 MG CAPS capsule Take 0.4 mg by mouth daily.  3    Physical Exam: Blood pressure 119/61, pulse 77, temperature 97.6 F (36.4 C), temperature source Oral, resp. rate 18, height 5' 6 (1.676 m), weight 61.7 kg, SpO2 95%.  Constitutional: Patient appears of normal development and good nutritional status for stated age, well-groomed, and of normal body habitus. Phonating appropriately.   Psychiatric: Alert and oriented to time, place, person and situation. The patient is noted to have good judgment and insight into the hospital visit  FOCUSED LOWER EXTREMITY EXAMINATION: RIGHT  Neurological:  - Gross protective sensation diminished bilaterally.  - No focal motor or sensory deficits identified bilaterally.   Vascular:  - Dorsalis Pedis  artery on palpation: faint - diminished.  - Posterior Tibial artery on palpation: nonpalpable - Capillary Filling Times: sluggish - Peripheral edema: mild focal to distal foot - Dependency Rubor: yes  Musculoskeletal:  HAV - significant with abutting of the 2nd digit. Contractures at the  Muscle strength: 5/5 in all 4 quadrants  Ankle Joint ROM: decreased, equinus noted Global foot - TTP: to the R fourth digit   Dermatological:  - Skin quality: atrophic - Interdigital spaces: c/d/i  - R - Stable eschar/ callus of the lateral 5th met head.  - R - PTU to the medial aspect of the 3rd digit - PIPJ  #WOUND 1  Type: Full thickness neuropathic ulceration Location: lateral aspect of the fourth digit --- Wound base: 0% Granular ; 100% Fibrosis ;0 % Necrotic  Adjacent tissue/ Wound borders: rolled PTB:No but probe to capsule Malodor: No Wound size approximatealy 2cm x 1.5cm x 0.3cm      Results for orders placed or performed during the hospital encounter of 11/19/24 (from the past 48 hours)  CBC     Status: None   Collection Time: 11/19/24 11:47 AM  Result Value Ref Range   WBC 8.9 4.0 - 10.5 K/uL   RBC 5.00 4.22 - 5.81 MIL/uL   Hemoglobin 14.7 13.0 - 17.0 g/dL   HCT 55.8 60.9 - 47.9 %   MCV 88.2 80.0 - 100.0 fL   MCH 29.4 26.0 - 34.0 pg   MCHC 33.3 30.0 - 36.0 g/dL   RDW 86.4 88.4 - 84.4 %   Platelets 213 150 - 400 K/uL   nRBC 0.0 0.0 - 0.2 %    Comment: Performed at [facility], [address] [place], [place] [address]  Comprehensive metabolic panel     Status: Abnormal   Collection Time: 11/19/24 11:47 AM  Result Value Ref Range   Sodium 141 135 - 145 mmol/L   Potassium 3.7 3.5 - 5.1 mmol/L   Chloride 105 98 - 111 mmol/L   CO2 21 (L) 22 - 32 mmol/L   Glucose, Bld 140 (H) 70 - 99 mg/dL    Comment: Glucose reference range applies only to samples taken after fasting for at least 8 hours.   BUN 31 (H) 8 - 23 mg/dL   Creatinine, Ser 8.35 (H) 0.61 - 1.24 mg/dL    Calcium  8.6 (L) 8.9 - 10.3 mg/dL   Total Protein 7.5 6.5 - 8.1 g/dL   Albumin 4.3 3.5 - 5.0 g/dL   AST 21 15 - 41 U/L   ALT 6 0 - 44 U/L   Alkaline Phosphatase 91 38 -  126 U/L   Total Bilirubin 0.4 0.0 - 1.2 mg/dL   GFR, Estimated 40 (L) >60 mL/min    Comment: (NOTE) Calculated using the CKD-EPI Creatinine Equation (2021)    Anion gap 15 5 - 15    Comment: Performed at [facility], [address] [place], [place] [address]  Protime-INR     Status: Abnormal   Collection Time: 11/19/24  7:15 PM  Result Value Ref Range   Prothrombin Time 15.4 (H) 11.4 - 15.2 seconds   INR 1.2 0.8 - 1.2    Comment: (NOTE) INR goal varies based on device and disease states. Performed at [facility], [address] [place], [place] [address]   APTT     Status: Abnormal   Collection Time: 11/19/24  7:15 PM  Result Value Ref Range   aPTT >200 (HH) 24 - 36 seconds    Comment: REPEATED TO VERIFY CRITICAL RESULT CALLED TO, READ BACK BY AND VERIFIED WITH: MATT YORK AT 2100 ON 11/19/24 BY SS Performed at [facility], [address] [place], [place] [address]   CBC     Status: None   Collection Time: 11/20/24  3:45 AM  Result Value Ref Range   WBC 7.9 4.0 - 10.5 K/uL   RBC 4.60 4.22 - 5.81 MIL/uL   Hemoglobin 13.5 13.0 - 17.0 g/dL   HCT 59.5 60.9 - 47.9 %   MCV 87.8 80.0 - 100.0 fL   MCH 29.3 26.0 - 34.0 pg   MCHC 33.4 30.0 - 36.0 g/dL   RDW 86.5 88.4 - 84.4 %   Platelets 202 150 - 400 K/uL   nRBC 0.0 0.0 - 0.2 %    Comment: Performed at [facility], [address] [place], [place] [address]  Heparin  level (unfractionated)     Status: None   Collection Time: 11/20/24  3:45 AM  Result Value Ref Range   Heparin  Unfractionated 0.56 0.30 - 0.70 IU/mL    Comment: (NOTE) The clinical reportable range upper limit is being lowered to >1.10 to align with the FDA approved guidance for the current laboratory assay.  If heparin  results are below expected  values, and patient dosage has  been confirmed, suggest follow up testing of antithrombin III levels. Performed at [facility], [address] [place], [place] [address]   Heparin  level (unfractionated)     Status: None   Collection Time: 11/20/24 11:58 AM  Result Value Ref Range   Heparin  Unfractionated 0.67 0.30 - 0.70 IU/mL    Comment: (NOTE) The clinical reportable range upper limit is being lowered to >1.10 to align with the FDA approved guidance for the current laboratory assay.  If heparin  results are below expected values, and patient dosage has  been confirmed, suggest follow up testing of antithrombin III levels. Performed at [facility], [address] [place], [place] [address]    DG Foot Complete Right Result Date: 11/19/2024 CLINICAL DATA:  Right foot pain. EXAM: RIGHT FOOT COMPLETE - 3+ VIEW COMPARISON:  None Available. 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 Moderate L2 valgus deformity. Mild-to-moderate degenerative changes of the first MTP joint and first interphalangeal joint. No acute fracture or dislocation chronic minimal degenerate change over the midfoot. IMPRESSION: 1. No acute findings. 2. Degenerative changes as described. 3. 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 Electronically Signed   By: Toribio Agreste M.D.   On: 11/19/2024 12:14  Assessment  - Ischemic ulceration, fourth digit (medial aspect), right foot   Currently patient is stable without leukocytosis no current imaging or clinical presentation consistent with deeper seeded infection. Given recent ABIs last obtained in Dec 2025 and very acute presentation of wound, I would not recommend any aggressive surgical intervention from podiatry standpoint and focus on more of pressure reduction, wound care, and possible vascular intervention (if warranted).  If digital contractures make wound care not feasible or  wound begins to place patient at too high of a risk due to infection - podiatric surgical intervention may be considered (again, would appreciate tandem optimization with vascular if possible). Podiatry to follow peripherally during current hospitalization.   Plan  - ABX: Appreciate Medicine / ID / Pharmacy assist with antibiotic stewardship and appropriate coverage.  - Diet: Per primary  - Activity: WBAT to the RIGHT lower extremity.  (Order placed) - Wound Care: Betadine  every day to BID - bandaid. (Order placed) I lightly splinted the 4th interdigital space with gauze careful with skin, holding gauze in place with bandaids. Will follow up to ensure no undue pressure to area. Okay to just change / redo the bandaid to the wound.   - Dispo: Pending vascular weigh in.    Thank you for allowing us  to participate in this patients care.     Drury Ardizzone KANDICE Blush 11/20/2024, 1:30 PM       [1]  Allergies Allergen Reactions   Codeine Itching

## 2024-11-20 NOTE — Consult Note (Signed)
 PHARMACY - ANTICOAGULATION CONSULT NOTE  Pharmacy Consult for heparin  dosing Indication: PAD  Allergies[1]  Patient Measurements: Height: 5' 6 (167.6 cm) Weight: 61.7 kg (136 lb) IBW/kg (Calculated) : 63.8 HEPARIN  DW (KG): 61.7  Vital Signs: Temp: 97.7 F (36.5 C) (01/14 0425) Temp Source: Oral (01/14 0425) BP: 101/41 (01/14 0425) Pulse Rate: 74 (01/14 0425)  Labs: Recent Labs    11/19/24 1147 11/19/24 1915 11/20/24 0345  HGB 14.7  --  13.5  HCT 44.1  --  40.4  PLT 213  --  202  APTT  --  >200*  --   LABPROT  --  15.4*  --   INR  --  1.2  --   HEPARINUNFRC  --   --  0.56  CREATININE 1.64*  --   --     Estimated Creatinine Clearance: 28.2 mL/min (A) (by C-G formula based on SCr of 1.64 mg/dL (H)).   Medical History: Past Medical History:  Diagnosis Date   AAA (abdominal aortic aneurysm) without rupture 04/05/2017   Anemia    Anxiety    a.) takes BZO (alprazolam ) as needed   Arthritis    Atherosclerosis of native arteries of extremity with intermittent claudication 08/10/2021   4 in legs   Bilateral cataracts    BPH (benign prostatic hyperplasia)    Carotid arterial disease    a.) s/p RICA stent placement   Cholelithiasis    CKD (chronic kidney disease), stage III (HCC)    right kidney doesn't work   Colon polyps    COPD (chronic obstructive pulmonary disease) (HCC)    Coronary artery disease    Erectile dysfunction    GERD (gastroesophageal reflux disease)    History of hiatal hernia    Hyperlipidemia    Hypertension    Hypokalemia 05/08/2023   Long-term use of aspirin  therapy    Nephrolithiasis    Olecranon bursitis, right elbow 05/25/2020   On long term clopidogrel  therapy    Penetrating atherosclerotic ulcer of aorta 03/22/2023   Peripheral vascular disease 04/05/2017   Psoriasis    Sigmoid diverticulosis    Type 2 MI (myocardial infarction) in setting of anemia 10/25/2023   Upper gastrointestinal hemorrhage due to angiodysplasia  05/10/2023   Wears dentures    full upper and lower    Medications:  No PTA anticoagulation  Assessment: Bryan Welch is an 89yoM presenting with concerns for right toe pain and PAD. Past medical history is notable for PVD status post bilateral lower extremity angioplasty and stenting, and RLE revascularization in 2018, CKD stage IIIb, chronic iron  deficiency anemia secondary to GI bleed, HTN, and AAA. Denies diabetes. Also had a toe on the right food removed a couple years ago due to infection.  On PTA clopidogrel  and low-dose aspirin . At rest, patient endorses right toe pain. Vascular surgery has been consulted and will see patient tomorrow. Podiatry also consulted. Per provider, to continue aspirin  and hold off on clopidogrel  today.   Glucose 140 on admission, and unknown last A1c. Vascular US  ABI, carotid, and AAA ordered. No acute findings in right foot xray.  Date Time HL Rate/Comment 01/14 0345 0.56 Therapeutic x1  Goal of Therapy:  Heparin  level 0.3-0.7 units/ml Monitor platelets by anticoagulation protocol: Yes   Plan:  HL therapeutic x1; CBC stable Continue heparin  infusion at 950 units/hr Check confirmatory anti-Xa level in 8 hours and daily while on heparin  Continue to monitor H&H and platelets  Thank you for involving pharmacy in this patient's care.  Damien Napoleon, PharmD Clinical Pharmacist 11/20/2024 4:41 AM    [1]  Allergies Allergen Reactions   Codeine Itching

## 2024-11-21 ENCOUNTER — Encounter: Payer: Self-pay | Admitting: Vascular Surgery

## 2024-11-21 ENCOUNTER — Encounter
Admission: EM | Disposition: A | Discharge status: Home / Self Care | Payer: Self-pay | Source: Home / Self Care | Attending: Internal Medicine

## 2024-11-21 DIAGNOSIS — T82868A Thrombosis of vascular prosthetic devices, implants and grafts, initial encounter: Secondary | ICD-10-CM

## 2024-11-21 DIAGNOSIS — I70221 Atherosclerosis of native arteries of extremities with rest pain, right leg: Principal | ICD-10-CM

## 2024-11-21 DIAGNOSIS — I7143 Infrarenal abdominal aortic aneurysm, without rupture: Secondary | ICD-10-CM

## 2024-11-21 DIAGNOSIS — I998 Other disorder of circulatory system: Secondary | ICD-10-CM | POA: Diagnosis not present

## 2024-11-21 HISTORY — PX: LOWER EXTREMITY ANGIOGRAPHY: CATH118251

## 2024-11-21 LAB — CBC
HCT: 42.4 % (ref 39.0–52.0)
Hemoglobin: 14.2 g/dL (ref 13.0–17.0)
MCH: 29.5 pg (ref 26.0–34.0)
MCHC: 33.5 g/dL (ref 30.0–36.0)
MCV: 88 fL (ref 80.0–100.0)
Platelets: 193 K/uL (ref 150–400)
RBC: 4.82 MIL/uL (ref 4.22–5.81)
RDW: 13.3 % (ref 11.5–15.5)
WBC: 8.4 K/uL (ref 4.0–10.5)
nRBC: 0 % (ref 0.0–0.2)

## 2024-11-21 LAB — BASIC METABOLIC PANEL WITH GFR
Anion gap: 15 (ref 5–15)
BUN: 39 mg/dL — ABNORMAL HIGH (ref 8–23)
CO2: 20 mmol/L — ABNORMAL LOW (ref 22–32)
Calcium: 8.5 mg/dL — ABNORMAL LOW (ref 8.9–10.3)
Chloride: 104 mmol/L (ref 98–111)
Creatinine, Ser: 1.43 mg/dL — ABNORMAL HIGH (ref 0.61–1.24)
GFR, Estimated: 48 mL/min — ABNORMAL LOW
Glucose, Bld: 91 mg/dL (ref 70–99)
Potassium: 3.6 mmol/L (ref 3.5–5.1)
Sodium: 139 mmol/L (ref 135–145)

## 2024-11-21 LAB — HEPARIN LEVEL (UNFRACTIONATED): Heparin Unfractionated: 0.52 [IU]/mL (ref 0.30–0.70)

## 2024-11-21 MED ORDER — MIDAZOLAM HCL 2 MG/2ML IJ SOLN
INTRAMUSCULAR | Status: AC
  Filled 2024-11-21: qty 4

## 2024-11-21 MED ORDER — HEPARIN (PORCINE) IN NACL 1000-0.9 UT/500ML-% IV SOLN
INTRAVENOUS | Status: DC | PRN
  Administered 2024-11-21: 1000 mL

## 2024-11-21 MED ORDER — FENTANYL CITRATE (PF) 100 MCG/2ML IJ SOLN
INTRAMUSCULAR | Status: AC
  Filled 2024-11-21: qty 2

## 2024-11-21 MED ORDER — HEPARIN SODIUM (PORCINE) 1000 UNIT/ML IJ SOLN
INTRAMUSCULAR | Status: AC
  Filled 2024-11-21: qty 10

## 2024-11-21 MED ORDER — HEPARIN SODIUM (PORCINE) 1000 UNIT/ML IJ SOLN
INTRAMUSCULAR | Status: DC | PRN
  Administered 2024-11-21: 5000 [IU] via INTRAVENOUS

## 2024-11-21 MED ORDER — LIDOCAINE-EPINEPHRINE (PF) 1 %-1:200000 IJ SOLN
INTRAMUSCULAR | Status: DC | PRN
  Administered 2024-11-21: 10 mL

## 2024-11-21 MED ORDER — MIDAZOLAM HCL (PF) 2 MG/2ML IJ SOLN
INTRAMUSCULAR | Status: DC | PRN
  Administered 2024-11-21: 1 mg via INTRAVENOUS
  Administered 2024-11-21: .5 mg via INTRAVENOUS

## 2024-11-21 MED ORDER — CEFAZOLIN SODIUM-DEXTROSE 2-4 GM/100ML-% IV SOLN
INTRAVENOUS | Status: AC
  Filled 2024-11-21: qty 100

## 2024-11-21 MED ORDER — FENTANYL CITRATE (PF) 100 MCG/2ML IJ SOLN
INTRAMUSCULAR | Status: DC | PRN
  Administered 2024-11-21: 25 ug via INTRAVENOUS
  Administered 2024-11-21: 50 ug via INTRAVENOUS

## 2024-11-21 MED ORDER — IODIXANOL 320 MG/ML IV SOLN
INTRAVENOUS | Status: DC | PRN
  Administered 2024-11-21: 70 mL

## 2024-11-21 NOTE — Consult Note (Signed)
 PHARMACY - ANTICOAGULATION CONSULT NOTE  Pharmacy Consult for heparin  dosing Indication: PAD  Allergies[1]  Patient Measurements: Height: 5' 6 (167.6 cm) Weight: 61.7 kg (136 lb) IBW/kg (Calculated) : 63.8 HEPARIN  DW (KG): 61.7  Vital Signs: Temp: 99 F (37.2 C) (01/15 0453) Temp Source: Oral (01/15 0453) BP: 119/55 (01/15 0453) Pulse Rate: 78 (01/15 0453)  Labs: Recent Labs    11/19/24 1147 11/19/24 1915 11/20/24 0345 11/20/24 1158 11/21/24 0614  HGB 14.7  --  13.5  --  14.2  HCT 44.1  --  40.4  --  42.4  PLT 213  --  202  --  193  APTT  --  >200*  --   --   --   LABPROT  --  15.4*  --   --   --   INR  --  1.2  --   --   --   HEPARINUNFRC  --   --  0.56 0.67 0.52  CREATININE 1.64*  --   --   --  1.43*    Estimated Creatinine Clearance: 32.4 mL/min (A) (by C-G formula based on SCr of 1.43 mg/dL (H)).   Medical History: Past Medical History:  Diagnosis Date   AAA (abdominal aortic aneurysm) without rupture 04/05/2017   Anemia    Anxiety    a.) takes BZO (alprazolam ) as needed   Arthritis    Atherosclerosis of native arteries of extremity with intermittent claudication 08/10/2021   4 in legs   Bilateral cataracts    BPH (benign prostatic hyperplasia)    Carotid arterial disease    a.) s/p RICA stent placement   Cholelithiasis    CKD (chronic kidney disease), stage III (HCC)    right kidney doesn't work   Colon polyps    COPD (chronic obstructive pulmonary disease) (HCC)    Coronary artery disease    Erectile dysfunction    GERD (gastroesophageal reflux disease)    History of hiatal hernia    Hyperlipidemia    Hypertension    Hypokalemia 05/08/2023   Long-term use of aspirin  therapy    Nephrolithiasis    Olecranon bursitis, right elbow 05/25/2020   On long term clopidogrel  therapy    Penetrating atherosclerotic ulcer of aorta 03/22/2023   Peripheral vascular disease 04/05/2017   Psoriasis    Sigmoid diverticulosis    Type 2 MI (myocardial  infarction) in setting of anemia 10/25/2023   Upper gastrointestinal hemorrhage due to angiodysplasia 05/10/2023   Wears dentures    full upper and lower    Medications:  No PTA anticoagulation  Assessment: Bryan Welch is an 73yoM presenting with concerns for right toe pain and PAD. Past medical history is notable for PVD status post bilateral lower extremity angioplasty and stenting, and RLE revascularization in 2018, CKD stage IIIb, chronic iron  deficiency anemia secondary to GI bleed, HTN, and AAA. Denies diabetes. Also had a toe on the right food removed a couple years ago due to infection.  On PTA clopidogrel  and low-dose aspirin . At rest, patient endorses right toe pain. Vascular surgery has been consulted and will see patient tomorrow. Podiatry also consulted. Per provider, to continue aspirin  and hold off on clopidogrel  today.   Glucose 140 on admission, and unknown last A1c. Vascular US  ABI, carotid, and AAA ordered. No acute findings in right foot xray.  Date Time HL Rate/Comment 01/14 0345 0.56 Therapeutic x1 01/14 1158 0.67 Therapeutic x2 01/15 0614 0.52 Therapeutic  Goal of Therapy:  Heparin  level 0.3-0.7 units/ml Monitor  platelets by anticoagulation protocol: Yes   Plan:  Continue heparin  infusion at 950 units/hr Check heparin  level with tomorrow AM labs Continue to monitor H&H and platelets  Thank you for involving pharmacy in this patient's care.   Will M. Lenon, PharmD, BCPS Clinical Pharmacist 11/21/2024 8:28 AM      [1]  Allergies Allergen Reactions   Codeine Itching

## 2024-11-21 NOTE — Interval H&P Note (Signed)
 History and Physical Interval Note:  11/21/2024 9:56 AM  Bryan Welch  has presented today for surgery, with the diagnosis of rest pain.  The various methods of treatment have been discussed with the patient and family. After consideration of risks, benefits and other options for treatment, the patient has consented to  Procedures: Lower Extremity Angiography (Right) as a surgical intervention.  [name] history has been reviewed, patient examined, no change in status, stable for surgery.  I have reviewed the patient's chart and labs.  Questions were answered to the patient's satisfaction.     Happy Begeman

## 2024-11-21 NOTE — Plan of Care (Signed)
 CCMD contacted to inform of tele showing extreme brady in 20-30s HR.  This RN went to the bedside to assess - patient sitting on side of the bed eating.  Leads on chest assessed and found to be in place.  Telebox was reading 36HR, so I unplugged the wires from the box and waiting for it to reset.  Next, I manually confirmed HR on wrist and confirmed Irregular HR but but definitely not in 20-30 range (rather mid 80s.)  Furthermore, patient did not appear to be in distress, communicated clearly, reported not SOB, blurred vision or disorientated.  Tele contacted to share findings.

## 2024-11-21 NOTE — Progress Notes (Signed)
 " Progress Note   [name] FMW:[id number] DOB: 1936/07/02 DOA: 11/19/2024     2 DOS: the patient was seen and examined on 11/21/2024      Brief hospital course: From HPI Bryan Welch is a 89 y.o. male with medical history significant of PVD status post bilateral lower extremity angioplasty and stenting, and RLE revascularization in 2018, CKD stage IIIb, CAD/NSTEMI, chronic iron  deficiency anemia secondary to GI bleed, HTN, AAA, brought in by family member for evaluation of worsening of pain of right toe.   Symptoms started about 2 weeks ago when patient started to have resting pain of right 4th toe, along with claudication and gradual getting worse.  Worsening at night, he describes the pain as  pulsatile.  Eventually he went to a walk-in clinic on January 8, he was diagnosed with infected fourth toe ulcer and started take doxycycline.  He completed 4 days course of doxycycline but has not feeling better.  This morning, daughter drove her to the ED.  Denies any fever chills no chest pain or shortness of breath. ED Course: 98.4 temperature, nontachycardic blood pressure 170/60 O2 saturation 98% on room air.  X-ray of right foot did not show significant bony abnormalities.  Blood work showed WBC 8.9 hemoglobin 14.7 BUN 31 creatinine 1.6 compared to baseline 1.7 glucose 140 bicarb 21.   Patient was given 1 dose of Ancef  and IV Dilaudid  for pain.  ET found diminished pedal pulses and patient was started on heparin  drip and vascular surgery and podiatry were consulted.       Assessment and Plan:   Right fourth toe pressure ulcer History of PVD Resting pain Vascular surgeon took patient for mechanical thrombectomy and angioplasty on 11/21/2024 Continue as needed pain medication  Podiatry was consulted  Continue aspirin  and statin therapy  CKD stage IIIb - Euvolemic and creatinine level stable   Peripheral neuropathy - Continue gabapentin    HTN - Discontinue  hydrochlorothiazide  for patient advanced kidney disease Changed to amlodipine    DVT prophylaxis: Heparin  drip   Code Status: Full code   Family Communication: Daughter at bedside Pending medical stabilization Consults called: Vascular surgeon Dr. Marea And podiatry Dr. Tanda   Subjective:  Seen and examined at bedside this morning Admits to improvement in foot pain Denies nausea vomiting chest pain or cough   Physical Exam:   General: Not in acute distress Respiratory: clear to auscultation bilaterally, no wheezing, no crackles. Normal respiratory effort. No accessory muscle use.  Cardiovascular: Regular rate and rhythm, no murmurs / rubs / gallops. No extremity edema.  Diminished pedal pulses bilaterally. No carotid bruits.  Abdomen: no tenderness, no masses palpated. No hepatosplenomegaly. Bowel sounds positive.  Musculoskeletal: no clubbing / cyanosis. No joint deformity upper and lower extremities. Good ROM, no contractures. Normal muscle tone.  Skin: Shallow ulcer of lateral side of fourth toe Neurologic: CN 2-12 grossly intact. Sensation intact, DTR normal. Strength 5/5 in all 4.  Psychiatric: Normal judgment and insight. Alert and oriented x 3. Normal mood.    Data Reviewed:    Latest Ref Rng & Units 11/21/2024    6:14 AM 11/20/2024    3:45 AM 11/19/2024   11:47 AM  CBC  WBC 4.0 - 10.5 K/uL 8.4  7.9  8.9   Hemoglobin 13.0 - 17.0 g/dL 85.7  86.4  85.2   Hematocrit 39.0 - 52.0 % 42.4  40.4  44.1   Platelets 150 - 400 K/uL 193  202  213  Latest Ref Rng & Units 11/21/2024    6:14 AM 11/19/2024   11:47 AM 09/23/2024    9:46 AM  BMP  Glucose 70 - 99 mg/dL 91  859  861   BUN 8 - 23 mg/dL 39  31  38   Creatinine 0.61 - 1.24 mg/dL 8.56  8.35  8.27   Sodium 135 - 145 mmol/L 139  141  135   Potassium 3.5 - 5.1 mmol/L 3.6  3.7  3.5   Chloride 98 - 111 mmol/L 104  105  95   CO2 22 - 32 mmol/L 20  21  26    Calcium  8.9 - 10.3 mg/dL 8.5  8.6  9.3     Vitals:   11/21/24  1145 11/21/24 1200 11/21/24 1215 11/21/24 1301  BP: (!) 154/54 (!) 143/51 (!) 150/58 (!) 142/53  Pulse: 73 70 72 72  Resp: 12 11 18 17   Temp:    (!) 97.1 F (36.2 C)  TempSrc:    Axillary  SpO2: 92% 93% 92% 96%  Weight:      Height:         Author: Drue ONEIDA Potter, MD 11/21/2024 6:05 PM  For on call review [email or web address].  "

## 2024-11-21 NOTE — Op Note (Signed)
 [facility]  Percutaneous Study/Intervention Procedural Note   Date of Surgery: 11/21/2024  Surgeon(s):Tessah Patchen    Assistants:none  Pre-operative Diagnosis: PAD with rest pain, acute on chronic ischemia right lower extremity  Post-operative diagnosis:  Same  Procedure(s) Performed:             1.  Ultrasound guidance for vascular access left femoral artery             2.  Catheter placement into right common femoral artery from left femoral approach             3.  Aortogram and selective right lower extremity angiogram             4.  Mechanical thrombectomy using the Rota Rex device for the right SFA, popliteal artery, and tibioperoneal trunk             5.  Angioplasty of the right peroneal artery with 2.5 mm diam 20 cm length angioplasty balloon  6.  Angioplasty of the right tibioperoneal trunk and most distal popliteal artery with 4 mm diameter by 8 cm length Lutonix drug-coated angioplasty balloon  7.  Stent placement to the proximal right SFA with 6 mm diameter by 15 cm length Viabahn stent             8.  StarClose closure device left femoral artery  EBL: 50 cc  Contrast: 70 cc  Fluoro Time: 8.4 minutes  Moderate Conscious Sedation Time: approximately 51 minutes using 1.5 mg of Versed  and 75 mcg of Fentanyl               Indications:  Patient is a 89 y.o.male with a long history of peripheral arterial disease now with disabling rest pain symptoms of the right lower extremity after previous intervention roughly 1 year ago. The patient has noninvasive study showing a marked reduction in the ABI consistent with thrombosis of his right SFA interventions. The patient is brought in for angiography for further evaluation and potential treatment.  Due to the limb threatening nature of the situation, angiogram was performed for attempted limb salvage. The patient is aware that if the procedure fails, amputation would be expected.  The patient also understands  that even with successful revascularization, amputation may still be required due to the severity of the situation.  Risks and benefits are discussed and informed consent is obtained.   Procedure:  The patient was identified and appropriate procedural time out was performed.  The patient was then placed supine on the table and prepped and draped in the usual sterile fashion. Moderate conscious sedation was administered during a face to face encounter with the patient throughout the procedure with my supervision of the RN administering medicines and monitoring the patient's vital signs, pulse oximetry, telemetry and mental status throughout from the start of the procedure until the patient was taken to the recovery room. Ultrasound was used to evaluate the left common femoral artery.  It was patent .  A digital ultrasound image was acquired.  A Seldinger needle was used to access the left common femoral artery under direct ultrasound guidance and a permanent image was performed.  A 0.035 J wire was advanced without resistance and a 5Fr sheath was placed.  Pigtail catheter was placed into the aorta and an AP aortogram was performed. This demonstrated normal renal arteries, a known moderate-sized aneurysm of the infrarenal aorta, and iliac segments without significant stenosis after previous stenting of the right iliac arteries. I  then crossed the aortic bifurcation and advanced to the right femoral head. Selective right lower extremity angiogram was then performed. This demonstrated occlusion above the previously placed SFA stents with clear areas of thrombosis and complete thrombosis of the SFA and popliteal stents.  The only distal reconstitution was of a peroneal artery which was small with moderate disease in the proximal to mid segment.  The tibioperoneal trunk was occluded as well. It was felt that it was in the patient's best interest to proceed with intervention after these images to avoid a second procedure  and a larger amount of contrast and fluoroscopy based off of the findings from the initial angiogram. The patient was systemically heparinized and a 6 French Ansell sheath was then placed over the [employer] wire. I then used a Kumpe catheter and the advantage wire to fairly easily navigate through the thrombotic occlusion in the right SFA, popliteal artery, and tibioperoneal trunk and then exchanged for a CXI catheter and a V18 wire after intraluminal flow in the mid to distal peroneal artery was confirmed.  I then made 2 passes with the Rota Rex device to debulk the large amount of thrombosis in the right SFA, popliteal arteries, and tibioperoneal trunk.  Following performance of this, there was a marked improvement with only a small amount of residual thrombus.  There was still what appeared to be a short segment occlusion at the top of the previously placed stent in the proximal to mid SFA with disease in the SFA above this for several centimeters.  There is a small amount of thrombus in the proximal portion of the previously placed stent.  The tibioperoneal trunk remained either occluded or nearly occluded and the peroneal artery had moderate stenosis downstream.  Flow was still quite sluggish and difficult to opacify distally from the proximal injection.  I then performed angioplasty of the tibioperoneal trunk and peroneal artery with a 2.5 mm diameter by 30 cm length angioplasty balloon inflated to 10 atm for 1 minute.  The peroneal artery was markedly improved with less than 25% residual stenosis, but the tibioperoneal trunk just distal to the previously placed stents in the distal popliteal artery had greater than 50% stenosis and the 2.5 mm balloon was undersized.  I then upsized to a 4 mm diameter by 8 cm length Lutonix drug-coated angioplasty balloon and inflated this to 10 atm for 1 minute.  Completion imaging showed only about a 20% residual stenosis after inflation with a larger balloon.  I  elected to primarily stent the proximal SFA to trapping the residual thrombus and treat the occlusion in the proximal to mid SFA just above the previously placed stent.  A 6 mm diameter by 15 cm length Viabahn stent was started about 2 to 3 cm below the origin of the SFA and about 5 cm down into the previous stent.  This was postdilated with a 5 mm diameter Lutonix drug-coated balloon with excellent angiograph completion result and no significant residual stenosis after stent placement. I elected to terminate the procedure. The sheath was removed and StarClose closure device was deployed in the left femoral artery with excellent hemostatic result. The patient was taken to the recovery room in stable condition having tolerated the procedure well.  Findings:               Aortogram:  This demonstrated normal renal arteries, a known moderate-sized aneurysm of the infrarenal aorta, and iliac segments without significant stenosis after previous stenting of the right  iliac arteries             Right lower Extremity:  This demonstrated normal renal arteries, a known moderate-sized aneurysm of the infrarenal aorta, and iliac segments without significant stenosis after previous stenting of the right iliac arteries   Disposition: Patient was taken to the recovery room in stable condition having tolerated the procedure well.  Complications: None  Selinda Gu 11/21/2024 11:38 AM   This note was created with Dragon Medical transcription system. Any errors in dictation are purely unintentional.

## 2024-11-22 DIAGNOSIS — Z9889 Other specified postprocedural states: Secondary | ICD-10-CM | POA: Diagnosis not present

## 2024-11-22 DIAGNOSIS — I70235 Atherosclerosis of native arteries of right leg with ulceration of other part of foot: Secondary | ICD-10-CM | POA: Diagnosis not present

## 2024-11-22 DIAGNOSIS — L97519 Non-pressure chronic ulcer of other part of right foot with unspecified severity: Secondary | ICD-10-CM | POA: Diagnosis not present

## 2024-11-22 DIAGNOSIS — I998 Other disorder of circulatory system: Secondary | ICD-10-CM | POA: Diagnosis not present

## 2024-11-22 LAB — CBC WITH DIFFERENTIAL/PLATELET
Abs Immature Granulocytes: 0.16 K/uL — ABNORMAL HIGH (ref 0.00–0.07)
Basophils Absolute: 0.1 K/uL (ref 0.0–0.1)
Basophils Relative: 1 %
Eosinophils Absolute: 0.4 K/uL (ref 0.0–0.5)
Eosinophils Relative: 4 %
HCT: 44.1 % (ref 39.0–52.0)
Hemoglobin: 14.7 g/dL (ref 13.0–17.0)
Immature Granulocytes: 2 %
Lymphocytes Relative: 8 %
Lymphs Abs: 0.8 K/uL (ref 0.7–4.0)
MCH: 29.6 pg (ref 26.0–34.0)
MCHC: 33.3 g/dL (ref 30.0–36.0)
MCV: 88.7 fL (ref 80.0–100.0)
Monocytes Absolute: 0.8 K/uL (ref 0.1–1.0)
Monocytes Relative: 9 %
Neutro Abs: 7.2 K/uL (ref 1.7–7.7)
Neutrophils Relative %: 76 %
Platelets: 168 K/uL (ref 150–400)
RBC: 4.97 MIL/uL (ref 4.22–5.81)
RDW: 13.2 % (ref 11.5–15.5)
WBC: 9.5 K/uL (ref 4.0–10.5)
nRBC: 0 % (ref 0.0–0.2)

## 2024-11-22 LAB — BASIC METABOLIC PANEL WITH GFR
Anion gap: 15 (ref 5–15)
BUN: 37 mg/dL — ABNORMAL HIGH (ref 8–23)
CO2: 19 mmol/L — ABNORMAL LOW (ref 22–32)
Calcium: 8.4 mg/dL — ABNORMAL LOW (ref 8.9–10.3)
Chloride: 104 mmol/L (ref 98–111)
Creatinine, Ser: 1.49 mg/dL — ABNORMAL HIGH (ref 0.61–1.24)
GFR, Estimated: 45 mL/min — ABNORMAL LOW
Glucose, Bld: 88 mg/dL (ref 70–99)
Potassium: 4.2 mmol/L (ref 3.5–5.1)
Sodium: 138 mmol/L (ref 135–145)

## 2024-11-22 LAB — HEPARIN LEVEL (UNFRACTIONATED): Heparin Unfractionated: 0.21 [IU]/mL — ABNORMAL LOW (ref 0.30–0.70)

## 2024-11-22 MED ORDER — HEPARIN BOLUS VIA INFUSION
900.0000 [IU] | Freq: Once | INTRAVENOUS | Status: AC
  Administered 2024-11-22: 900 [IU] via INTRAVENOUS
  Filled 2024-11-22: qty 900

## 2024-11-22 NOTE — Consult Note (Signed)
 PHARMACY - ANTICOAGULATION CONSULT NOTE  Pharmacy Consult for heparin  dosing Indication: PAD  Allergies[1]  Patient Measurements: Height: 5' 6 (167.6 cm) Weight: 61.7 kg (136 lb 0.4 oz) IBW/kg (Calculated) : 63.8 HEPARIN  DW (KG): 61.7  Vital Signs: Temp: 99.2 F (37.3 C) (01/16 0405) BP: 160/45 (01/16 0405) Pulse Rate: 87 (01/16 0405)  Labs: Recent Labs    11/19/24 1147 11/19/24 1147 11/19/24 1915 11/20/24 0345 11/20/24 1158 11/21/24 0614 11/22/24 0547  HGB 14.7  --   --  13.5  --  14.2 14.7  HCT 44.1  --   --  40.4  --  42.4 44.1  PLT 213  --   --  202  --  193 168  APTT  --   --  >200*  --   --   --   --   LABPROT  --   --  15.4*  --   --   --   --   INR  --   --  1.2  --   --   --   --   HEPARINUNFRC  --    < >  --  0.56 0.67 0.52 0.21*  CREATININE 1.64*  --   --   --   --  1.43* 1.49*   < > = values in this interval not displayed.    Estimated Creatinine Clearance: 31.1 mL/min (A) (by C-G formula based on SCr of 1.49 mg/dL (H)).   Medical History: Past Medical History:  Diagnosis Date   AAA (abdominal aortic aneurysm) without rupture 04/05/2017   Anemia    Anxiety    a.) takes BZO (alprazolam ) as needed   Arthritis    Atherosclerosis of native arteries of extremity with intermittent claudication 08/10/2021   4 in legs   Bilateral cataracts    BPH (benign prostatic hyperplasia)    Carotid arterial disease    a.) s/p RICA stent placement   Cholelithiasis    CKD (chronic kidney disease), stage III (HCC)    right kidney doesn't work   Colon polyps    COPD (chronic obstructive pulmonary disease) (HCC)    Coronary artery disease    Erectile dysfunction    GERD (gastroesophageal reflux disease)    History of hiatal hernia    Hyperlipidemia    Hypertension    Hypokalemia 05/08/2023   Long-term use of aspirin  therapy    Nephrolithiasis    Olecranon bursitis, right elbow 05/25/2020   On long term clopidogrel  therapy    Penetrating atherosclerotic  ulcer of aorta 03/22/2023   Peripheral vascular disease 04/05/2017   Psoriasis    Sigmoid diverticulosis    Type 2 MI (myocardial infarction) in setting of anemia 10/25/2023   Upper gastrointestinal hemorrhage due to angiodysplasia 05/10/2023   Wears dentures    full upper and lower    Medications:  No PTA anticoagulation  Assessment: [name] is an 47yoM presenting with concerns for right toe pain and PAD. Past medical history is notable for PVD status post bilateral lower extremity angioplasty and stenting, and RLE revascularization in 2018, CKD stage IIIb, chronic iron  deficiency anemia secondary to GI bleed, HTN, and AAA. Denies diabetes. Also had a toe on the right food removed a couple years ago due to infection.  On PTA clopidogrel  and low-dose aspirin . At rest, patient endorses right toe pain. Vascular surgery has been consulted and will see patient tomorrow. Podiatry also consulted. Per provider, to continue aspirin  and hold off on clopidogrel  today.  Glucose 140 on admission, and unknown last A1c. Vascular US  ABI, carotid, and AAA ordered. No acute findings in right foot xray.  Date Time HL Rate/Comment 01/14 0345 0.56 Therapeutic x1 01/14 1158 0.67 Therapeutic x2 01/15 0614 0.52 Therapeutic 01/16 0547 0.21 Subtherapeutic on 950 units/hour  Goal of Therapy:  Heparin  level 0.3-0.7 units/ml Monitor platelets by anticoagulation protocol: Yes   Plan:  HL subtherapeutic this AM; CBC stable Give heparin  bolus of 900 units x1 Increase heparin  infusion rate to 1050 units/hour Check heparin  level 8 hours after rate change and daily while on heparin  infusion Continue to monitor H&H and platelets  Thank you for involving pharmacy in this patient's care.   Damien Napoleon, PharmD Clinical Pharmacist 11/22/2024 6:38 AM    [1]  Allergies Allergen Reactions   Codeine Itching

## 2024-11-22 NOTE — Progress Notes (Signed)
 [facility]  Daily Progress Note   Subjective  -   Patient resting comfortably in bed.  Denies groin pain.  Notes that his foot is still uncomfortable but the pain is better than prior to intervention.  Objective Vitals:   11/21/24 1800 11/21/24 2100 11/22/24 0405 11/22/24 0740  BP:  (!) 142/59 (!) 160/45 (!) 154/48  Pulse: 82 86 87 83  Resp:  16 16 17   Temp:  98.8 F (37.1 C) 99.2 F (37.3 C) 98.8 F (37.1 C)  TempSrc:      SpO2:  94% 94% 94%  Weight:      Height:        Intake/Output Summary (Last 24 hours) at 11/22/2024 1257 Last data filed at 11/22/2024 0402 Gross per 24 hour  Intake 286.43 ml  Output 300 ml  Net -13.57 ml    PULM  CTAB CV  RRR VASC  right foot warm, 1+ DP/PT RLE  Laboratory CBC    Component Value Date/Time   WBC 9.5 11/22/2024 0547   HGB 14.7 11/22/2024 0547   HGB 15.7 09/23/2024 0945   HGB 14.5 03/22/2013 0237   HCT 44.1 11/22/2024 0547   HCT 42.6 03/22/2013 0237   PLT 168 11/22/2024 0547   PLT 377 09/23/2024 0945   PLT 241 03/22/2013 0237    BMET    Component Value Date/Time   NA 138 11/22/2024 0547   NA 141 03/22/2013 0237   K 4.2 11/22/2024 0547   K 4.3 03/22/2013 0237   CL 104 11/22/2024 0547   CL 110 (H) 03/22/2013 0237   CO2 19 (L) 11/22/2024 0547   CO2 24 03/22/2013 0237   GLUCOSE 88 11/22/2024 0547   GLUCOSE 101 (H) 03/22/2013 0237   BUN 37 (H) 11/22/2024 0547   BUN 19 (H) 03/22/2013 0237   CREATININE 1.49 (H) 11/22/2024 0547   CREATININE 1.72 (H) 09/23/2024 0946   CREATININE 1.53 (H) 03/22/2013 0237   CALCIUM  8.4 (L) 11/22/2024 0547   CALCIUM  8.1 (L) 03/22/2013 0237   GFRNONAA 45 (L) 11/22/2024 0547   GFRNONAA 38 (L) 09/23/2024 0946   GFRNONAA 44 (L) 03/22/2013 0237   GFRAA 43 (L) 11/21/2019 0911   GFRAA 51 (L) 03/22/2013 0237    Assessment/Planning: POD #1 s/p right lower extremity angiogram  Groin site looks good, there was still a small amount of oozing but that appears to have  resolved.  It is soft no evidence of hematoma. Patient should discharge on aspirin , Plavix , and statin Continues to have pain in his foot but I also suspect this is from his wound and some revascularization.  The rest pain seems to have resolved. Patient stable for discharge from vascular perspective.  Orvin FORBES Daring  11/22/2024, 12:57 PM

## 2024-11-22 NOTE — Care Management Important Message (Signed)
 Important Message  Patient Details  Name: Bryan Welch MRN: [id number] Date of Birth: [birth year]   Important Message Given:  Yes - Medicare IM     Tamrah Victorino W, CMA 11/22/2024, 12:21 PM

## 2024-11-22 NOTE — Plan of Care (Signed)

## 2024-11-22 NOTE — Progress Notes (Signed)
 " Progress Note   [name] FMW:[id number] DOB: Jun 01, 1936 DOA: 11/19/2024     3 DOS: the patient was seen and examined on 11/22/2024    Brief hospital course: From HPI Bryan Welch is a 89 y.o. male with medical history significant of PVD status post bilateral lower extremity angioplasty and stenting, and RLE revascularization in 2018, CKD stage IIIb, CAD/NSTEMI, chronic iron  deficiency anemia secondary to GI bleed, HTN, AAA, brought in by family member for evaluation of worsening of pain of right toe.   Symptoms started about 2 weeks ago when patient started to have resting pain of right 4th toe, along with claudication and gradual getting worse.  Worsening at night, he describes the pain as  pulsatile.  Eventually he went to a walk-in clinic on January 8, he was diagnosed with infected fourth toe ulcer and started take doxycycline.  He completed 4 days course of doxycycline but has not feeling better.  This morning, daughter drove her to the ED.  Denies any fever chills no chest pain or shortness of breath. ED Course: 98.4 temperature, nontachycardic blood pressure 170/60 O2 saturation 98% on room air.  X-ray of right foot did not show significant bony abnormalities.  Blood work showed WBC 8.9 hemoglobin 14.7 BUN 31 creatinine 1.6 compared to baseline 1.7 glucose 140 bicarb 21.   Patient was given 1 dose of Ancef  and IV Dilaudid  for pain.  ET found diminished pedal pulses and patient was started on heparin  drip and vascular surgery and podiatry were consulted.       Assessment and Plan:   Right fourth toe pressure ulcer History of PVD Resting pain Vascular surgeon took patient for mechanical thrombectomy and angioplasty on 11/21/2024 Continue as needed pain medication  Podiatry was consulted  Continue aspirin  and statin therapy   CKD stage IIIb - Euvolemic and creatinine level stable   Peripheral neuropathy - Continue gabapentin    HTN - Discontinue  hydrochlorothiazide  for patient advanced kidney disease Changed to amlodipine    DVT prophylaxis: Heparin  drip   Code Status: Full code   Family Communication: Daughter at bedside Pending medical stabilization Consults called: Vascular surgeon Dr. Marea And podiatry Dr. Tanda   Subjective:  Seen and examined at bedside this morning Patient still having reperfusion pain involving the right leg Complaining of 10 out of 10 pain immediately pain medication went off We initially plan for discharge today however given this amount of pain we will not be able to discharge today   Physical Exam:   General: Not in acute distress Respiratory: clear to auscultation bilaterally, no wheezing, no crackles. Normal respiratory effort. No accessory muscle use.  Cardiovascular: Regular rate and rhythm, no murmurs / rubs / gallops. No extremity edema.  Diminished pedal pulses bilaterally. No carotid bruits.  Abdomen: no tenderness, no masses palpated. No hepatosplenomegaly. Bowel sounds positive.  Musculoskeletal: no clubbing / cyanosis. No joint deformity upper and lower extremities. Good ROM, no contractures. Normal muscle tone.  Skin: Shallow ulcer of lateral side of fourth toe Neurologic: CN 2-12 grossly intact. Sensation intact, DTR normal. Strength 5/5 in all 4.  Psychiatric: Normal judgment and insight. Alert and oriented x 3. Normal mood.    Data Reviewed:    Latest Ref Rng & Units 11/22/2024    5:47 AM 11/21/2024    6:14 AM 11/20/2024    3:45 AM  CBC  WBC 4.0 - 10.5 K/uL 9.5  8.4  7.9   Hemoglobin 13.0 - 17.0 g/dL 14.7  14.2  13.5   Hematocrit 39.0 - 52.0 % 44.1  42.4  40.4   Platelets 150 - 400 K/uL 168  193  202        Latest Ref Rng & Units 11/22/2024    5:47 AM 11/21/2024    6:14 AM 11/19/2024   11:47 AM  BMP  Glucose 70 - 99 mg/dL 88  91  859   BUN 8 - 23 mg/dL 37  39  31   Creatinine 0.61 - 1.24 mg/dL 8.50  8.56  8.35   Sodium 135 - 145 mmol/L 138  139  141   Potassium 3.5 - 5.1  mmol/L 4.2  3.6  3.7   Chloride 98 - 111 mmol/L 104  104  105   CO2 22 - 32 mmol/L 19  20  21    Calcium  8.9 - 10.3 mg/dL 8.4  8.5  8.6      Vitals:   11/21/24 2100 11/22/24 0405 11/22/24 0740 11/22/24 1554  BP: (!) 142/59 (!) 160/45 (!) 154/48 (!) 131/46  Pulse: 86 87 83 95  Resp: 16 16 17 17   Temp: 98.8 F (37.1 C) 99.2 F (37.3 C) 98.8 F (37.1 C) 98.3 F (36.8 C)  TempSrc:      SpO2: 94% 94% 94% 98%  Weight:      Height:         Author: Drue ONEIDA Potter, MD 11/22/2024 5:48 PM  For on call review [email or web address].  "

## 2024-11-23 ENCOUNTER — Encounter: Payer: Self-pay | Admitting: Internal Medicine

## 2024-11-23 ENCOUNTER — Other Ambulatory Visit: Payer: Self-pay

## 2024-11-23 DIAGNOSIS — I998 Other disorder of circulatory system: Secondary | ICD-10-CM | POA: Diagnosis not present

## 2024-11-23 LAB — CBC
HCT: 43.1 % (ref 39.0–52.0)
Hemoglobin: 13.9 g/dL (ref 13.0–17.0)
MCH: 29.3 pg (ref 26.0–34.0)
MCHC: 32.3 g/dL (ref 30.0–36.0)
MCV: 90.9 fL (ref 80.0–100.0)
Platelets: 172 K/uL (ref 150–400)
RBC: 4.74 MIL/uL (ref 4.22–5.81)
RDW: 13.1 % (ref 11.5–15.5)
WBC: 9.6 K/uL (ref 4.0–10.5)
nRBC: 0 % (ref 0.0–0.2)

## 2024-11-23 MED ORDER — OXYCODONE HCL 10 MG PO TABS
10.0000 mg | ORAL_TABLET | Freq: Four times a day (QID) | ORAL | 0 refills | Status: AC | PRN
  Filled 2024-11-23: qty 10, 3d supply, fill #0

## 2024-11-23 MED ORDER — AMLODIPINE BESYLATE 10 MG PO TABS
10.0000 mg | ORAL_TABLET | Freq: Every day | ORAL | 1 refills | Status: AC
  Filled 2024-11-23: qty 30, 30d supply, fill #0

## 2024-11-23 MED ORDER — ATORVASTATIN CALCIUM 80 MG PO TABS
80.0000 mg | ORAL_TABLET | Freq: Every day | ORAL | 6 refills | Status: AC
  Filled 2024-11-23: qty 30, 30d supply, fill #0

## 2024-11-23 MED ORDER — SENNOSIDES-DOCUSATE SODIUM 8.6-50 MG PO TABS
1.0000 | ORAL_TABLET | Freq: Every evening | ORAL | 0 refills | Status: AC | PRN
  Filled 2024-11-23: qty 30, 30d supply, fill #0

## 2024-11-23 NOTE — Discharge Summary (Signed)
 " Physician Discharge Summary   [name] MRN: [id number] DOB: 29-Mar-1936  Admit date:     11/19/2024  Discharge date: 11/23/24  Discharge Physician: Drue ONEIDA Potter   PCP: Rudolpho Norleen BIRCH, MD   Recommendations at discharge:  Follow-up with vascular surgery  Discharge Diagnoses: Principal Problem:   Limb ischemia Active Problems:   Lower limb ischemia  Resolved Problems:   * No resolved hospital problems. [facility] Course:  From HPI Bryan Welch is a 89 y.o. male with medical history significant of PVD status post bilateral lower extremity angioplasty and stenting, and RLE revascularization in 2018, CKD stage IIIb, CAD/NSTEMI, chronic iron  deficiency anemia secondary to GI bleed, HTN, AAA, brought in by family member for evaluation of worsening of pain of right toe.   Symptoms started about 2 weeks ago when patient started to have resting pain of right 4th toe, along with claudication and gradual getting worse.  Worsening at night, he describes the pain as  pulsatile.  Eventually he went to a walk-in clinic on January 8, he was diagnosed with infected fourth toe ulcer and started take doxycycline.  He completed 4 days course of doxycycline but has not feeling better.  This morning, daughter drove her to the ED.  Denies any fever chills no chest pain or shortness of breath. ED Course: 98.4 temperature, nontachycardic blood pressure 170/60 O2 saturation 98% on room air.  X-ray of right foot did not show significant bony abnormalities.  Blood work showed WBC 8.9 hemoglobin 14.7 BUN 31 creatinine 1.6 compared to baseline 1.7 glucose 140 bicarb 21.   Patient was given 1 dose of Ancef  and IV Dilaudid  for pain.  ET found diminished pedal pulses and patient was started on heparin  drip and vascular surgery and podiatry were consulted.       Assessment and Plan:   Right fourth toe pressure ulcer History of PVD Resting pain Vascular surgeon took patient for mechanical  thrombectomy and angioplasty on 11/21/2024 Continue as needed pain medication  Outpatient follow-up with vascular surgery at discharge Continue aspirin  and statin therapy   CKD stage IIIb - Euvolemic and creatinine level stable   Peripheral neuropathy - Continue gabapentin    HTN - Discontinue hydrochlorothiazide  for patient advanced kidney disease Changed to amlodipine      Consultants: Vascular and podiatry Procedures performed: As mentioned above Disposition: Home Diet recommendation:  Cardiac and Carb modified diet DISCHARGE MEDICATION: Allergies as of 11/23/2024       Reactions   Codeine Itching        Medication List     STOP taking these medications    hydrochlorothiazide  25 MG tablet Commonly known as: HYDRODIURIL    mupirocin ointment 2 % Commonly known as: BACTROBAN   pravastatin  20 MG tablet Commonly known as: PRAVACHOL        TAKE these medications    acetaminophen  500 MG tablet Commonly known as: TYLENOL  Take 1,000 mg by mouth at bedtime.   ALPRAZolam  0.25 MG tablet Commonly known as: XANAX  Take 0.25 mg by mouth at bedtime.   amLODipine  10 MG tablet Commonly known as: NORVASC  Take 1 tablet (10 mg total) by mouth daily. Start taking on: November 24, 2024   aspirin  EC 81 MG tablet Take 81 mg daily by mouth.   atorvastatin  80 MG tablet Commonly known as: LIPITOR Take 1 tablet (80 mg total) by mouth daily.   Cholecalciferol  25 MCG (1000 UT) capsule Take 2,000 Units by mouth daily.   clopidogrel  75  MG tablet Commonly known as: PLAVIX  TAKE 1 TABLET BY MOUTH EVERY DAY   doxycycline 100 MG tablet Commonly known as: VIBRA-TABS Take 100 mg by mouth 2 (two) times daily.   gabapentin  300 MG capsule Commonly known as: NEURONTIN  Take 300 mg by mouth 2 (two) times daily.   Oxycodone  HCl 10 MG Tabs Take 1 tablet (10 mg total) by mouth every 6 (six) hours as needed for breakthrough pain.   pantoprazole  40 MG tablet Commonly known as:  PROTONIX  Take 40 mg by mouth daily.   potassium chloride  10 MEQ tablet Commonly known as: KLOR-CON  Take 10 mEq by mouth daily.   PRESERVISION AREDS 2 PO Take 1 tablet by mouth 2 (two) times daily.   Senna-Plus 8.6-50 MG tablet Generic drug: senna-docusate Take 1 tablet by mouth at bedtime as needed for mild constipation.   tamsulosin  0.4 MG Caps capsule Commonly known as: FLOMAX  Take 0.4 mg by mouth daily.        Follow-up Information     Dew, Selinda RAMAN, MD Follow up in 4 week(s).   Specialties: Vascular Surgery, Radiology, Interventional Cardiology Why: See JD/FB in 4 weeks with ABIs Contact information: [address] [place] [address] [phone]                Discharge Exam: Bryan Welch   11/19/24 1142 11/21/24 0940  Weight: 61.7 kg 61.7 kg   General: Not in acute distress Respiratory: clear to auscultation bilaterally, no wheezing, no crackles. Normal respiratory effort. No accessory muscle use.  Cardiovascular: Regular rate and rhythm, no murmurs / rubs / gallops.  Abdomen: no tenderness, no masses palpated. No hepatosplenomegaly. Bowel sounds positive.  Musculoskeletal: no clubbing / cyanosis. No joint deformity upper and lower extremities. Good ROM, no contractures. Normal muscle tone.  Neurologic: CN 2-12 grossly intact. Sensation intact, DTR normal. Strength 5/5 in all 4.  Psychiatric: Normal judgment and insight. Alert and oriented x 3. Normal mood.   Condition at discharge: good  The results of significant diagnostics from this hospitalization (including imaging, microbiology, ancillary and laboratory) are listed below for reference.   Imaging Studies: PERIPHERAL VASCULAR CATHETERIZATION Result Date: 11/21/2024 See surgical note for result.  DG Foot Complete Right Result Date: 11/19/2024 CLINICAL DATA:  Right foot pain. EXAM: RIGHT FOOT COMPLETE - 3+ VIEW COMPARISON:  None Available. 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 Moderate L2 valgus deformity. Mild-to-moderate degenerative changes of the first MTP joint and first interphalangeal joint. No acute fracture or dislocation chronic minimal degenerate change over the midfoot. IMPRESSION: 1. No acute findings. 2. Degenerative changes as described. 3. 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 Electronically Signed   By: Toribio Agreste M.D.   On: 11/19/2024 12:14   VAS US  CAROTID Result Date: 11/06/2024 Carotid Arterial Duplex Study Patient Name:  THORIN STARNER  Date of Exam:   11/05/2024 Medical Rec #: [id number]          Accession #:    [id number] Date of Birth: 30-Dec-1935          Patient Gender: M Patient Age:   40 years Exam Location:   [facility] Procedure:      VAS US  CAROTID Referring Phys: SELINDA GU --------------------------------------------------------------------------------  Performing Technologist: Jerel Croak RVT  Examination Guidelines: A complete evaluation includes B-mode imaging, spectral Doppler, color Doppler, and power Doppler as needed of all accessible portions of each vessel. Bilateral  testing is considered an integral part of a complete examination. Limited examinations for reoccurring indications may be performed as noted.  Right Carotid Findings: +----------+--------+--------+--------+----------------------+--------+           PSV cm/sEDV cm/sStenosisPlaque Description    Comments +----------+--------+--------+--------+----------------------+--------+ CCA Prox  96      10                                             +----------+--------+--------+--------+----------------------+--------+ CCA Mid   118     21      <50%    calcific and irregular         +----------+--------+--------+--------+----------------------+--------+ CCA Distal91      5                                               +----------+--------+--------+--------+----------------------+--------+ ICA Prox  66      16                                             +----------+--------+--------+--------+----------------------+--------+ ICA Mid   70      11                                             +----------+--------+--------+--------+----------------------+--------+ ICA Distal76      10      1-39%                                  +----------+--------+--------+--------+----------------------+--------+ ECA       77      1                                              +----------+--------+--------+--------+----------------------+--------+ +----------+--------+-------+----------------+-------------------+           PSV cm/sEDV cmsDescribe        Arm Pressure (mmHG) +----------+--------+-------+----------------+-------------------+ Subclavian105            Multiphasic, WNL                    +----------+--------+-------+----------------+-------------------+ +---------+--------+--+--------+-+---------+ VertebralPSV cm/s23EDV cm/s4Antegrade +---------+--------+--+--------+-+---------+  Left Carotid Findings: +----------+--------+--------+--------+----------------------+--------+           PSV cm/sEDV cm/sStenosisPlaque Description    Comments +----------+--------+--------+--------+----------------------+--------+ CCA Prox  112     26                                             +----------+--------+--------+--------+----------------------+--------+ CCA Mid   113     20                                             +----------+--------+--------+--------+----------------------+--------+  CCA Distal174     29      >50%    calcific and irregular         +----------+--------+--------+--------+----------------------+--------+ ICA Prox  175     29      40-59%  calcific and irregular         +----------+--------+--------+--------+----------------------+--------+ ICA Mid   121      20                                             +----------+--------+--------+--------+----------------------+--------+ ICA Distal81      23                                             +----------+--------+--------+--------+----------------------+--------+ ECA       78      3                                              +----------+--------+--------+--------+----------------------+--------+ +----------+--------+--------+----------------+-------------------+           PSV cm/sEDV cm/sDescribe        Arm Pressure (mmHG) +----------+--------+--------+----------------+-------------------+ [id number]              Multiphasic, WNL                    +----------+--------+--------+----------------+-------------------+ +---------+--------+--+--------+--+---------+ VertebralPSV cm/s53EDV cm/s13Antegrade +---------+--------+--+--------+--+---------+   Summary: Right Carotid: Velocities in the right ICA are consistent with a 1-39% stenosis.                Non-hemodynamically significant plaque <50% noted in the CCA. The                ECA appears <50% stenosed. Left Carotid: Velocities in the left ICA are consistent with a 40-59% stenosis.               Hemodynamically significant plaque >50% visualized in the CCA. The               ECA appears <50% stenosed. Vertebrals:  Bilateral vertebral arteries demonstrate antegrade flow. Subclavians: Normal flow hemodynamics were seen in bilateral subclavian              arteries. *See table(s) above for measurements and observations.  Electronically signed by Selinda Gu MD on 11/06/2024 at 8:02:10 AM.    Final    VAS US  AAA DUPLEX Result Date: 11/06/2024 ABDOMINAL AORTA STUDY Patient Name:  FALCON MCCASKEY  Date of Exam:   11/05/2024 Medical Rec #: [id number]          Accession #:    [id number] Date of Birth: Aug 21, 1936          Patient Gender: M Patient Age:   79 years Exam Location:  [facility] Procedure:      VAS US  AAA  DUPLEX Referring Phys: SELINDA GU --------------------------------------------------------------------------------  Indications: Follow up exam for known AAA. Risk Factors: Hypertension, hyperlipidemia, prior MI, coronary artery disease. Vascular Interventions: 08/09/2023 Thrombectomy, PTA and lysis SFA to peroneal                         08/10/2023  SFA stent and tib per PTA.  Comparison Study: 07/2024 Performing Technologist: Jerel Croak RVT  Examination Guidelines: A complete evaluation includes B-mode imaging, spectral Doppler, color Doppler, and power Doppler as needed of all accessible portions of each vessel. Bilateral testing is considered an integral part of a complete examination. Limited examinations for reoccurring indications may be performed as noted.  Abdominal Aorta Findings: +-----------+-------+----------+----------+--------+--------+--------+ Location   AP (cm)Trans (cm)PSV (cm/s)WaveformThrombusComments +-----------+-------+----------+----------+--------+--------+--------+ Proximal   2.25   2.22      58                                 +-----------+-------+----------+----------+--------+--------+--------+ Mid        4.06   4.14      47                                 +-----------+-------+----------+----------+--------+--------+--------+ Distal     1.92   2.00      43                                 +-----------+-------+----------+----------+--------+--------+--------+ RT CIA Prox0.9    1.0       77                                 +-----------+-------+----------+----------+--------+--------+--------+ LT CIA Prox0.7    0.9       128                                +-----------+-------+----------+----------+--------+--------+--------+  Summary: Abdominal Aorta: There is evidence of abnormal dilatation of the mid Abdominal aorta. The largest aortic measurement is 4.1 cm. Tortuous abdominal aorta which causes varied measurements from multiple previous  ultrasounds. From a longitudinal duplex view the fusiform AAA, as seen by previous CTA is infrarenal and in the mid abdominal aorta. Distal transthoracic/proximal abdominal aorta is of normal caliber then dilates to approximally 4.2cm max dimension in the mid region and tapers down to 2cm just proximal to bifurcation. The largest aortic diameter has decreased compared to prior exam. Previous diameter measurement was 4.4 cm obtained on 07/2024.  *See table(s) above for measurements and observations.  Electronically signed by Selinda Gu MD on 11/06/2024 at 8:01:43 AM.    Final    VAS US  ABI WITH/WO TBI Result Date: 11/06/2024  LOWER EXTREMITY DOPPLER STUDY Patient Name:  WASHINGTON WHEDBEE  Date of Exam:   11/05/2024 Medical Rec #: [id number]          Accession #:    [id number] Date of Birth: 1936/04/01          Patient Gender: M Patient Age:   20 years Exam Location:  [facility] Procedure:      VAS US  ABI WITH/WO TBI Referring Phys: SELINDA GU --------------------------------------------------------------------------------  Indications: Claudication, and peripheral artery disease. High Risk         Hypertension, hyperlipidemia, prior MI, coronary artery Factors:          disease. Other Factors: Patient reports bilateral lower extremity numbness.  Vascular Interventions: 08/09/2023 Thrombectomy, PTA and lysis SFA to peroneal  08/10/2023 SFA stent and tib per PTA. Comparison Study: 07/2024 Performing Technologist: Jerel Croak RVT  Examination Guidelines: A complete evaluation includes at minimum, Doppler waveform signals and systolic blood pressure reading at the level of bilateral brachial, anterior tibial, and posterior tibial arteries, when vessel segments are accessible. Bilateral testing is considered an integral part of a complete examination. Photoelectric Plethysmograph (PPG) waveforms and toe systolic pressure readings are included as required and additional duplex testing as  needed. Limited examinations for reoccurring indications may be performed as noted.  ABI Findings: +---------+------------------+-----+----------+--------+ Right    Rt Pressure (mmHg)IndexWaveform  Comment  +---------+------------------+-----+----------+--------+ Brachial 173                                       +---------+------------------+-----+----------+--------+ PTA      91                0.53 monophasic         +---------+------------------+-----+----------+--------+ DP       97                0.56 monophasic         +---------+------------------+-----+----------+--------+ Great Toe27                0.16 Abnormal           +---------+------------------+-----+----------+--------+ +---------+------------------+-----+----------+-------+ Left     Lt Pressure (mmHg)IndexWaveform  Comment +---------+------------------+-----+----------+-------+ Brachial 170                                      +---------+------------------+-----+----------+-------+ PTA      151               0.87 monophasic        +---------+------------------+-----+----------+-------+ DP       119               0.69 monophasic        +---------+------------------+-----+----------+-------+ Great Toe46                0.27 Abnormal          +---------+------------------+-----+----------+-------+ +-------+-----------+-----------+------------+------------+ ABI/TBIToday's ABIToday's TBIPrevious ABIPrevious TBI +-------+-----------+-----------+------------+------------+ Right  .56        .16        .50         .28          +-------+-----------+-----------+------------+------------+ Left   .87        .27        .54         .22          +-------+-----------+-----------+------------+------------+ Left ABIs appear increased. Right TBIs appear decreased.  Summary: Right: Resting right ankle-brachial index indicates moderate right lower extremity arterial disease. The right  toe-brachial index is abnormal. PPG tracings appear dampened.  Left: Resting left ankle-brachial index indicates mild left lower extremity arterial disease. The left toe-brachial index is abnormal. PPG tracings appear dampened.  *See table(s) above for measurements and observations.  Electronically signed by Selinda Gu MD on 11/06/2024 at 8:00:10 AM.    Final     Microbiology: Results for orders placed or performed during the hospital encounter of 08/09/23  MRSA Next Gen by PCR, Nasal     Status: None   Collection Time: 08/09/23 11:16 AM   Specimen: Nasal Mucosa; Nasal Swab  Result Value Ref Range  Status   MRSA by PCR Next Gen NOT DETECTED NOT DETECTED Final    Comment: (NOTE) The GeneXpert MRSA Assay (FDA approved for NASAL specimens only), is one component of a comprehensive MRSA colonization surveillance program. It is not intended to diagnose MRSA infection nor to guide or monitor treatment for MRSA infections. Test performance is not FDA approved in patients less than 93 years old. Performed at [facility], [address] [place], [place] [address]     Labs: CBC: Recent Labs  Lab 11/19/24 1147 11/20/24 0345 11/21/24 9385 11/22/24 0547 11/23/24 0536  WBC 8.9 7.9 8.4 9.5 9.6  NEUTROABS  --   --   --  7.2  --   HGB 14.7 13.5 14.2 14.7 13.9  HCT 44.1 40.4 42.4 44.1 43.1  MCV 88.2 87.8 88.0 88.7 90.9  PLT 213 202 193 168 172   Basic Metabolic Panel: Recent Labs  Lab 11/19/24 1147 11/21/24 0614 11/22/24 0547  NA 141 139 138  K 3.7 3.6 4.2  CL 105 104 104  CO2 21* 20* 19*  GLUCOSE 140* 91 88  BUN 31* 39* 37*  CREATININE 1.64* 1.43* 1.49*  CALCIUM  8.6* 8.5* 8.4*   Liver Function Tests: Recent Labs  Lab 11/19/24 1147  AST 21  ALT 6  ALKPHOS 91  BILITOT 0.4  PROT 7.5  ALBUMIN 4.3   CBG: No results for input(s): GLUCAP in the last 168 hours.  Discharge time spent:  37 minutes.  Signed: Drue ONEIDA Potter, MD [facility] 11/23/2024 "

## 2024-11-23 NOTE — Discharge Instructions (Signed)
 Follow up with PCP and pick up medications from pharmacy. Make sure to take all antibiotics. Do not double up on narcotic/pain medication or drink alcohol during this time. Do not drive while taking narcotic medication. Call 911 or return to ER for life threatening issues or other concerns.

## 2024-11-23 NOTE — Progress Notes (Signed)
 Spoke with patients son and daughter this morning. Provided update and POC for discharge. Daughter, Molly, states that she will be on her way to pick him up within the hour. Provided update to patient, and messaged Meds to Bed to get eta for meds.

## 2024-11-23 NOTE — Progress Notes (Addendum)
 Pt bed alarm going off. Tech stated patient was sitting on the floor. Pt alert and oriented and states he did not fall that he got down on the floor to look for his shoes. Pt denies falls. CN at bedside.  Patient refused tele box. Stating that he is going home today. Notified MD and tele of above.

## 2024-11-28 ENCOUNTER — Encounter (INDEPENDENT_AMBULATORY_CARE_PROVIDER_SITE_OTHER): Payer: Self-pay | Admitting: Vascular Surgery

## 2024-12-09 ENCOUNTER — Ambulatory Visit (INDEPENDENT_AMBULATORY_CARE_PROVIDER_SITE_OTHER): Admitting: Nurse Practitioner

## 2024-12-09 ENCOUNTER — Encounter (INDEPENDENT_AMBULATORY_CARE_PROVIDER_SITE_OTHER)

## 2024-12-17 ENCOUNTER — Ambulatory Visit (INDEPENDENT_AMBULATORY_CARE_PROVIDER_SITE_OTHER): Admitting: Nurse Practitioner

## 2024-12-17 ENCOUNTER — Encounter (INDEPENDENT_AMBULATORY_CARE_PROVIDER_SITE_OTHER)

## 2025-02-04 ENCOUNTER — Ambulatory Visit (INDEPENDENT_AMBULATORY_CARE_PROVIDER_SITE_OTHER): Admitting: Vascular Surgery

## 2025-02-04 ENCOUNTER — Encounter (INDEPENDENT_AMBULATORY_CARE_PROVIDER_SITE_OTHER)

## 2025-03-26 ENCOUNTER — Inpatient Hospital Stay: Admitting: Internal Medicine

## 2025-03-26 ENCOUNTER — Inpatient Hospital Stay

## 2025-05-06 ENCOUNTER — Ambulatory Visit (INDEPENDENT_AMBULATORY_CARE_PROVIDER_SITE_OTHER): Admitting: Vascular Surgery

## 2025-05-06 ENCOUNTER — Other Ambulatory Visit (INDEPENDENT_AMBULATORY_CARE_PROVIDER_SITE_OTHER)

## 2025-11-04 ENCOUNTER — Encounter (INDEPENDENT_AMBULATORY_CARE_PROVIDER_SITE_OTHER)

## 2025-11-04 ENCOUNTER — Ambulatory Visit (INDEPENDENT_AMBULATORY_CARE_PROVIDER_SITE_OTHER): Admitting: Vascular Surgery
